# Patient Record
Sex: Male | Born: 1945
Health system: Southern US, Community
[De-identification: ages and names within clinical notes are randomized; demographics above are authoritative.]

## PROBLEM LIST (undated history)

## (undated) DIAGNOSIS — E119 Type 2 diabetes mellitus without complications: Secondary | ICD-10-CM

## (undated) DIAGNOSIS — E669 Obesity, unspecified: Secondary | ICD-10-CM

## (undated) DIAGNOSIS — K259 Gastric ulcer, unspecified as acute or chronic, without hemorrhage or perforation: Secondary | ICD-10-CM

## (undated) DIAGNOSIS — I10 Essential (primary) hypertension: Secondary | ICD-10-CM

## (undated) DIAGNOSIS — G629 Polyneuropathy, unspecified: Secondary | ICD-10-CM

## (undated) DIAGNOSIS — K219 Gastro-esophageal reflux disease without esophagitis: Secondary | ICD-10-CM

## (undated) DIAGNOSIS — E785 Hyperlipidemia, unspecified: Secondary | ICD-10-CM

## (undated) DIAGNOSIS — M519 Unspecified thoracic, thoracolumbar and lumbosacral intervertebral disc disorder: Secondary | ICD-10-CM

## (undated) DIAGNOSIS — J449 Chronic obstructive pulmonary disease, unspecified: Secondary | ICD-10-CM

## (undated) DIAGNOSIS — I739 Peripheral vascular disease, unspecified: Secondary | ICD-10-CM

## (undated) DIAGNOSIS — K5792 Diverticulitis of intestine, part unspecified, without perforation or abscess without bleeding: Secondary | ICD-10-CM

## (undated) DIAGNOSIS — I712 Thoracic aortic aneurysm, without rupture: Secondary | ICD-10-CM

## (undated) DIAGNOSIS — Z77098 Contact with and (suspected) exposure to other hazardous, chiefly nonmedicinal, chemicals: Secondary | ICD-10-CM

## (undated) DIAGNOSIS — M1990 Unspecified osteoarthritis, unspecified site: Secondary | ICD-10-CM

## (undated) DIAGNOSIS — E1140 Type 2 diabetes mellitus with diabetic neuropathy, unspecified: Secondary | ICD-10-CM

## (undated) DIAGNOSIS — I2510 Atherosclerotic heart disease of native coronary artery without angina pectoris: Secondary | ICD-10-CM

## (undated) DIAGNOSIS — G4730 Sleep apnea, unspecified: Secondary | ICD-10-CM

## (undated) DIAGNOSIS — I7121 Aneurysm of the ascending aorta, without rupture: Secondary | ICD-10-CM

## (undated) DIAGNOSIS — K635 Polyp of colon: Secondary | ICD-10-CM

## (undated) DIAGNOSIS — K8020 Calculus of gallbladder without cholecystitis without obstruction: Secondary | ICD-10-CM

## (undated) DIAGNOSIS — F4310 Post-traumatic stress disorder, unspecified: Secondary | ICD-10-CM

## (undated) DIAGNOSIS — M5090 Cervical disc disorder, unspecified, unspecified cervical region: Secondary | ICD-10-CM

## (undated) HISTORY — DX: Type 2 diabetes mellitus with diabetic neuropathy, unspecified: E11.40

## (undated) HISTORY — PX: LUMBAR LAMINECTOMY: SHX95

## (undated) HISTORY — PX: SHOULDER SURGERY: SHX246

## (undated) HISTORY — PX: CARDIAC CATHETERIZATION: SHX172

## (undated) HISTORY — PX: CERVICAL SPINE SURGERY: SHX589

## (undated) HISTORY — DX: Gastric ulcer, unspecified as acute or chronic, without hemorrhage or perforation: K25.9

## (undated) HISTORY — PX: REPLACEMENT TOTAL KNEE: SUR1224

## (undated) HISTORY — DX: Sleep apnea, unspecified: G47.30

## (undated) HISTORY — PX: EYE SURGERY: SHX253

## (undated) HISTORY — PX: PARTIAL COLECTOMY: SHX5273

## (undated) HISTORY — DX: Atherosclerotic heart disease of native coronary artery without angina pectoris: I25.10

## (undated) HISTORY — DX: Gastro-esophageal reflux disease without esophagitis: K21.9

## (undated) HISTORY — DX: Contact with and (suspected) exposure to other hazardous, chiefly nonmedicinal, chemicals: Z77.098

## (undated) HISTORY — DX: Chronic obstructive pulmonary disease, unspecified: J44.9

## (undated) HISTORY — DX: Calculus of gallbladder without cholecystitis without obstruction: K80.20

## (undated) HISTORY — DX: Polyp of colon: K63.5

## (undated) HISTORY — PX: PTCA: SHX146

## (undated) HISTORY — DX: Essential (primary) hypertension: I10

## (undated) HISTORY — PX: FOOT SURGERY: SHX648

## (undated) HISTORY — DX: Diverticulitis of intestine, part unspecified, without perforation or abscess without bleeding: K57.92

## (undated) HISTORY — DX: Hyperlipidemia, unspecified: E78.5

## (undated) HISTORY — DX: Post-traumatic stress disorder, unspecified: F43.10

## (undated) HISTORY — PX: OTHER SURGICAL HISTORY: SHX169

## (undated) HISTORY — DX: Type 2 diabetes mellitus without complications: E11.9

---

## 2011-06-08 DIAGNOSIS — E7800 Pure hypercholesterolemia, unspecified: Secondary | ICD-10-CM | POA: Diagnosis not present

## 2011-06-08 DIAGNOSIS — L02219 Cutaneous abscess of trunk, unspecified: Secondary | ICD-10-CM | POA: Diagnosis not present

## 2011-06-08 DIAGNOSIS — I10 Essential (primary) hypertension: Secondary | ICD-10-CM | POA: Diagnosis not present

## 2011-06-08 DIAGNOSIS — L03319 Cellulitis of trunk, unspecified: Secondary | ICD-10-CM | POA: Diagnosis not present

## 2011-06-08 DIAGNOSIS — E119 Type 2 diabetes mellitus without complications: Secondary | ICD-10-CM | POA: Diagnosis not present

## 2011-06-14 DIAGNOSIS — E119 Type 2 diabetes mellitus without complications: Secondary | ICD-10-CM | POA: Diagnosis not present

## 2011-06-14 DIAGNOSIS — Z79899 Other long term (current) drug therapy: Secondary | ICD-10-CM | POA: Diagnosis not present

## 2011-06-14 DIAGNOSIS — E7800 Pure hypercholesterolemia, unspecified: Secondary | ICD-10-CM | POA: Diagnosis not present

## 2011-06-21 DIAGNOSIS — I10 Essential (primary) hypertension: Secondary | ICD-10-CM | POA: Diagnosis not present

## 2011-06-21 DIAGNOSIS — Z7982 Long term (current) use of aspirin: Secondary | ICD-10-CM | POA: Diagnosis not present

## 2011-06-21 DIAGNOSIS — Z79899 Other long term (current) drug therapy: Secondary | ICD-10-CM | POA: Diagnosis not present

## 2011-06-21 DIAGNOSIS — R0602 Shortness of breath: Secondary | ICD-10-CM | POA: Diagnosis not present

## 2011-06-21 DIAGNOSIS — M1990 Unspecified osteoarthritis, unspecified site: Secondary | ICD-10-CM | POA: Diagnosis not present

## 2011-06-21 DIAGNOSIS — Z87891 Personal history of nicotine dependence: Secondary | ICD-10-CM | POA: Diagnosis not present

## 2011-06-21 DIAGNOSIS — E119 Type 2 diabetes mellitus without complications: Secondary | ICD-10-CM | POA: Diagnosis not present

## 2011-06-21 DIAGNOSIS — E785 Hyperlipidemia, unspecified: Secondary | ICD-10-CM | POA: Diagnosis not present

## 2011-06-21 DIAGNOSIS — I2510 Atherosclerotic heart disease of native coronary artery without angina pectoris: Secondary | ICD-10-CM | POA: Diagnosis not present

## 2011-06-21 DIAGNOSIS — R079 Chest pain, unspecified: Secondary | ICD-10-CM | POA: Diagnosis not present

## 2011-06-21 DIAGNOSIS — F4310 Post-traumatic stress disorder, unspecified: Secondary | ICD-10-CM | POA: Diagnosis not present

## 2011-06-21 DIAGNOSIS — E669 Obesity, unspecified: Secondary | ICD-10-CM | POA: Diagnosis not present

## 2011-06-21 DIAGNOSIS — G5690 Unspecified mononeuropathy of unspecified upper limb: Secondary | ICD-10-CM | POA: Diagnosis not present

## 2011-06-21 DIAGNOSIS — G5790 Unspecified mononeuropathy of unspecified lower limb: Secondary | ICD-10-CM | POA: Diagnosis not present

## 2011-06-22 DIAGNOSIS — I2510 Atherosclerotic heart disease of native coronary artery without angina pectoris: Secondary | ICD-10-CM | POA: Diagnosis not present

## 2011-06-22 DIAGNOSIS — E785 Hyperlipidemia, unspecified: Secondary | ICD-10-CM | POA: Diagnosis not present

## 2011-06-22 DIAGNOSIS — I10 Essential (primary) hypertension: Secondary | ICD-10-CM | POA: Diagnosis not present

## 2011-06-22 DIAGNOSIS — E119 Type 2 diabetes mellitus without complications: Secondary | ICD-10-CM | POA: Diagnosis not present

## 2011-06-22 DIAGNOSIS — R079 Chest pain, unspecified: Secondary | ICD-10-CM | POA: Diagnosis not present

## 2011-07-05 DIAGNOSIS — I10 Essential (primary) hypertension: Secondary | ICD-10-CM | POA: Diagnosis not present

## 2011-07-05 DIAGNOSIS — E119 Type 2 diabetes mellitus without complications: Secondary | ICD-10-CM | POA: Diagnosis not present

## 2011-07-05 DIAGNOSIS — F411 Generalized anxiety disorder: Secondary | ICD-10-CM | POA: Diagnosis not present

## 2011-07-20 DIAGNOSIS — I209 Angina pectoris, unspecified: Secondary | ICD-10-CM | POA: Diagnosis not present

## 2011-07-20 DIAGNOSIS — R109 Unspecified abdominal pain: Secondary | ICD-10-CM | POA: Diagnosis not present

## 2011-07-20 DIAGNOSIS — R9430 Abnormal result of cardiovascular function study, unspecified: Secondary | ICD-10-CM | POA: Diagnosis not present

## 2011-07-20 DIAGNOSIS — I2510 Atherosclerotic heart disease of native coronary artery without angina pectoris: Secondary | ICD-10-CM | POA: Diagnosis not present

## 2011-07-25 DIAGNOSIS — R109 Unspecified abdominal pain: Secondary | ICD-10-CM | POA: Diagnosis not present

## 2011-07-27 DIAGNOSIS — Z01818 Encounter for other preprocedural examination: Secondary | ICD-10-CM | POA: Diagnosis not present

## 2011-07-27 DIAGNOSIS — I209 Angina pectoris, unspecified: Secondary | ICD-10-CM | POA: Diagnosis not present

## 2011-07-28 DIAGNOSIS — Z9189 Other specified personal risk factors, not elsewhere classified: Secondary | ICD-10-CM | POA: Diagnosis not present

## 2011-07-28 DIAGNOSIS — Z9861 Coronary angioplasty status: Secondary | ICD-10-CM | POA: Diagnosis not present

## 2011-07-28 DIAGNOSIS — I209 Angina pectoris, unspecified: Secondary | ICD-10-CM | POA: Diagnosis not present

## 2011-07-28 DIAGNOSIS — I2510 Atherosclerotic heart disease of native coronary artery without angina pectoris: Secondary | ICD-10-CM | POA: Diagnosis not present

## 2011-07-28 DIAGNOSIS — G4733 Obstructive sleep apnea (adult) (pediatric): Secondary | ICD-10-CM | POA: Diagnosis not present

## 2011-07-28 DIAGNOSIS — I10 Essential (primary) hypertension: Secondary | ICD-10-CM | POA: Diagnosis not present

## 2011-07-28 DIAGNOSIS — R9439 Abnormal result of other cardiovascular function study: Secondary | ICD-10-CM | POA: Diagnosis not present

## 2011-07-28 DIAGNOSIS — J449 Chronic obstructive pulmonary disease, unspecified: Secondary | ICD-10-CM | POA: Diagnosis not present

## 2011-07-28 DIAGNOSIS — E119 Type 2 diabetes mellitus without complications: Secondary | ICD-10-CM | POA: Diagnosis not present

## 2011-09-01 DIAGNOSIS — L97509 Non-pressure chronic ulcer of other part of unspecified foot with unspecified severity: Secondary | ICD-10-CM | POA: Diagnosis not present

## 2011-09-07 DIAGNOSIS — Z4789 Encounter for other orthopedic aftercare: Secondary | ICD-10-CM | POA: Diagnosis not present

## 2011-09-07 DIAGNOSIS — M25569 Pain in unspecified knee: Secondary | ICD-10-CM | POA: Diagnosis not present

## 2011-09-07 DIAGNOSIS — Z09 Encounter for follow-up examination after completed treatment for conditions other than malignant neoplasm: Secondary | ICD-10-CM | POA: Diagnosis not present

## 2011-09-08 DIAGNOSIS — I10 Essential (primary) hypertension: Secondary | ICD-10-CM | POA: Diagnosis not present

## 2011-09-08 DIAGNOSIS — Z0389 Encounter for observation for other suspected diseases and conditions ruled out: Secondary | ICD-10-CM | POA: Diagnosis not present

## 2011-09-08 DIAGNOSIS — I2510 Atherosclerotic heart disease of native coronary artery without angina pectoris: Secondary | ICD-10-CM | POA: Diagnosis not present

## 2011-09-08 DIAGNOSIS — I200 Unstable angina: Secondary | ICD-10-CM | POA: Diagnosis not present

## 2011-09-08 DIAGNOSIS — E119 Type 2 diabetes mellitus without complications: Secondary | ICD-10-CM | POA: Diagnosis not present

## 2011-09-09 DIAGNOSIS — Z0389 Encounter for observation for other suspected diseases and conditions ruled out: Secondary | ICD-10-CM | POA: Diagnosis not present

## 2011-09-09 DIAGNOSIS — I10 Essential (primary) hypertension: Secondary | ICD-10-CM | POA: Diagnosis not present

## 2011-09-09 DIAGNOSIS — I200 Unstable angina: Secondary | ICD-10-CM | POA: Diagnosis not present

## 2011-09-09 DIAGNOSIS — E785 Hyperlipidemia, unspecified: Secondary | ICD-10-CM | POA: Diagnosis not present

## 2011-09-09 DIAGNOSIS — E119 Type 2 diabetes mellitus without complications: Secondary | ICD-10-CM | POA: Diagnosis not present

## 2011-09-09 DIAGNOSIS — I2510 Atherosclerotic heart disease of native coronary artery without angina pectoris: Secondary | ICD-10-CM | POA: Diagnosis not present

## 2011-10-12 DIAGNOSIS — W57XXXA Bitten or stung by nonvenomous insect and other nonvenomous arthropods, initial encounter: Secondary | ICD-10-CM | POA: Diagnosis not present

## 2011-10-14 DIAGNOSIS — I209 Angina pectoris, unspecified: Secondary | ICD-10-CM | POA: Diagnosis not present

## 2011-10-14 DIAGNOSIS — Z9861 Coronary angioplasty status: Secondary | ICD-10-CM | POA: Diagnosis not present

## 2011-10-14 DIAGNOSIS — I2510 Atherosclerotic heart disease of native coronary artery without angina pectoris: Secondary | ICD-10-CM | POA: Diagnosis not present

## 2011-11-11 DIAGNOSIS — Z5189 Encounter for other specified aftercare: Secondary | ICD-10-CM | POA: Diagnosis not present

## 2011-11-11 DIAGNOSIS — Z9861 Coronary angioplasty status: Secondary | ICD-10-CM | POA: Diagnosis not present

## 2011-11-11 DIAGNOSIS — E119 Type 2 diabetes mellitus without complications: Secondary | ICD-10-CM | POA: Diagnosis not present

## 2011-11-14 DIAGNOSIS — Z5189 Encounter for other specified aftercare: Secondary | ICD-10-CM | POA: Diagnosis not present

## 2011-11-14 DIAGNOSIS — Z9861 Coronary angioplasty status: Secondary | ICD-10-CM | POA: Diagnosis not present

## 2011-11-14 DIAGNOSIS — E119 Type 2 diabetes mellitus without complications: Secondary | ICD-10-CM | POA: Diagnosis not present

## 2011-11-18 DIAGNOSIS — Z5189 Encounter for other specified aftercare: Secondary | ICD-10-CM | POA: Diagnosis not present

## 2011-11-18 DIAGNOSIS — E119 Type 2 diabetes mellitus without complications: Secondary | ICD-10-CM | POA: Diagnosis not present

## 2011-11-18 DIAGNOSIS — Z9861 Coronary angioplasty status: Secondary | ICD-10-CM | POA: Diagnosis not present

## 2011-11-21 DIAGNOSIS — Z9861 Coronary angioplasty status: Secondary | ICD-10-CM | POA: Diagnosis not present

## 2011-11-21 DIAGNOSIS — Z5189 Encounter for other specified aftercare: Secondary | ICD-10-CM | POA: Diagnosis not present

## 2011-11-21 DIAGNOSIS — E119 Type 2 diabetes mellitus without complications: Secondary | ICD-10-CM | POA: Diagnosis not present

## 2011-11-23 DIAGNOSIS — Z9861 Coronary angioplasty status: Secondary | ICD-10-CM | POA: Diagnosis not present

## 2011-11-23 DIAGNOSIS — Z5189 Encounter for other specified aftercare: Secondary | ICD-10-CM | POA: Diagnosis not present

## 2011-11-23 DIAGNOSIS — E119 Type 2 diabetes mellitus without complications: Secondary | ICD-10-CM | POA: Diagnosis not present

## 2011-11-25 DIAGNOSIS — Z9861 Coronary angioplasty status: Secondary | ICD-10-CM | POA: Diagnosis not present

## 2011-11-25 DIAGNOSIS — E119 Type 2 diabetes mellitus without complications: Secondary | ICD-10-CM | POA: Diagnosis not present

## 2011-11-25 DIAGNOSIS — Z5189 Encounter for other specified aftercare: Secondary | ICD-10-CM | POA: Diagnosis not present

## 2011-11-28 DIAGNOSIS — Z9861 Coronary angioplasty status: Secondary | ICD-10-CM | POA: Diagnosis not present

## 2011-11-28 DIAGNOSIS — Z5189 Encounter for other specified aftercare: Secondary | ICD-10-CM | POA: Diagnosis not present

## 2011-11-28 DIAGNOSIS — E119 Type 2 diabetes mellitus without complications: Secondary | ICD-10-CM | POA: Diagnosis not present

## 2011-11-30 DIAGNOSIS — Z9861 Coronary angioplasty status: Secondary | ICD-10-CM | POA: Diagnosis not present

## 2011-11-30 DIAGNOSIS — Z5189 Encounter for other specified aftercare: Secondary | ICD-10-CM | POA: Diagnosis not present

## 2011-11-30 DIAGNOSIS — E119 Type 2 diabetes mellitus without complications: Secondary | ICD-10-CM | POA: Diagnosis not present

## 2011-12-05 DIAGNOSIS — E119 Type 2 diabetes mellitus without complications: Secondary | ICD-10-CM | POA: Diagnosis not present

## 2011-12-05 DIAGNOSIS — Z5189 Encounter for other specified aftercare: Secondary | ICD-10-CM | POA: Diagnosis not present

## 2011-12-05 DIAGNOSIS — Z9861 Coronary angioplasty status: Secondary | ICD-10-CM | POA: Diagnosis not present

## 2011-12-07 DIAGNOSIS — Z5189 Encounter for other specified aftercare: Secondary | ICD-10-CM | POA: Diagnosis not present

## 2011-12-07 DIAGNOSIS — E119 Type 2 diabetes mellitus without complications: Secondary | ICD-10-CM | POA: Diagnosis not present

## 2011-12-07 DIAGNOSIS — Z9861 Coronary angioplasty status: Secondary | ICD-10-CM | POA: Diagnosis not present

## 2011-12-09 DIAGNOSIS — Z5189 Encounter for other specified aftercare: Secondary | ICD-10-CM | POA: Diagnosis not present

## 2011-12-09 DIAGNOSIS — E119 Type 2 diabetes mellitus without complications: Secondary | ICD-10-CM | POA: Diagnosis not present

## 2011-12-09 DIAGNOSIS — Z9861 Coronary angioplasty status: Secondary | ICD-10-CM | POA: Diagnosis not present

## 2011-12-14 DIAGNOSIS — Z9861 Coronary angioplasty status: Secondary | ICD-10-CM | POA: Diagnosis not present

## 2011-12-14 DIAGNOSIS — E119 Type 2 diabetes mellitus without complications: Secondary | ICD-10-CM | POA: Diagnosis not present

## 2011-12-14 DIAGNOSIS — Z5189 Encounter for other specified aftercare: Secondary | ICD-10-CM | POA: Diagnosis not present

## 2011-12-16 DIAGNOSIS — Z5189 Encounter for other specified aftercare: Secondary | ICD-10-CM | POA: Diagnosis not present

## 2011-12-16 DIAGNOSIS — Z9861 Coronary angioplasty status: Secondary | ICD-10-CM | POA: Diagnosis not present

## 2011-12-16 DIAGNOSIS — E119 Type 2 diabetes mellitus without complications: Secondary | ICD-10-CM | POA: Diagnosis not present

## 2011-12-19 DIAGNOSIS — E119 Type 2 diabetes mellitus without complications: Secondary | ICD-10-CM | POA: Diagnosis not present

## 2011-12-19 DIAGNOSIS — Z5189 Encounter for other specified aftercare: Secondary | ICD-10-CM | POA: Diagnosis not present

## 2011-12-19 DIAGNOSIS — M25549 Pain in joints of unspecified hand: Secondary | ICD-10-CM | POA: Diagnosis not present

## 2011-12-19 DIAGNOSIS — M19049 Primary osteoarthritis, unspecified hand: Secondary | ICD-10-CM | POA: Diagnosis not present

## 2011-12-19 DIAGNOSIS — Z9861 Coronary angioplasty status: Secondary | ICD-10-CM | POA: Diagnosis not present

## 2011-12-23 DIAGNOSIS — I10 Essential (primary) hypertension: Secondary | ICD-10-CM | POA: Diagnosis not present

## 2011-12-23 DIAGNOSIS — M25549 Pain in joints of unspecified hand: Secondary | ICD-10-CM | POA: Diagnosis not present

## 2011-12-23 DIAGNOSIS — M19049 Primary osteoarthritis, unspecified hand: Secondary | ICD-10-CM | POA: Diagnosis not present

## 2011-12-23 DIAGNOSIS — F329 Major depressive disorder, single episode, unspecified: Secondary | ICD-10-CM | POA: Diagnosis not present

## 2011-12-23 DIAGNOSIS — I2510 Atherosclerotic heart disease of native coronary artery without angina pectoris: Secondary | ICD-10-CM | POA: Diagnosis not present

## 2011-12-23 DIAGNOSIS — E119 Type 2 diabetes mellitus without complications: Secondary | ICD-10-CM | POA: Diagnosis not present

## 2012-01-04 DIAGNOSIS — Z9861 Coronary angioplasty status: Secondary | ICD-10-CM | POA: Diagnosis not present

## 2012-01-04 DIAGNOSIS — Z5189 Encounter for other specified aftercare: Secondary | ICD-10-CM | POA: Diagnosis not present

## 2012-01-04 DIAGNOSIS — E119 Type 2 diabetes mellitus without complications: Secondary | ICD-10-CM | POA: Diagnosis not present

## 2012-01-06 DIAGNOSIS — Z9861 Coronary angioplasty status: Secondary | ICD-10-CM | POA: Diagnosis not present

## 2012-01-06 DIAGNOSIS — E119 Type 2 diabetes mellitus without complications: Secondary | ICD-10-CM | POA: Diagnosis not present

## 2012-01-06 DIAGNOSIS — Z5189 Encounter for other specified aftercare: Secondary | ICD-10-CM | POA: Diagnosis not present

## 2012-01-09 DIAGNOSIS — Z9861 Coronary angioplasty status: Secondary | ICD-10-CM | POA: Diagnosis not present

## 2012-01-09 DIAGNOSIS — E119 Type 2 diabetes mellitus without complications: Secondary | ICD-10-CM | POA: Diagnosis not present

## 2012-01-09 DIAGNOSIS — Z5189 Encounter for other specified aftercare: Secondary | ICD-10-CM | POA: Diagnosis not present

## 2012-01-11 DIAGNOSIS — Z9861 Coronary angioplasty status: Secondary | ICD-10-CM | POA: Diagnosis not present

## 2012-01-11 DIAGNOSIS — E119 Type 2 diabetes mellitus without complications: Secondary | ICD-10-CM | POA: Diagnosis not present

## 2012-01-11 DIAGNOSIS — Z5189 Encounter for other specified aftercare: Secondary | ICD-10-CM | POA: Diagnosis not present

## 2012-01-13 DIAGNOSIS — Z9861 Coronary angioplasty status: Secondary | ICD-10-CM | POA: Diagnosis not present

## 2012-01-13 DIAGNOSIS — Z5189 Encounter for other specified aftercare: Secondary | ICD-10-CM | POA: Diagnosis not present

## 2012-01-13 DIAGNOSIS — E119 Type 2 diabetes mellitus without complications: Secondary | ICD-10-CM | POA: Diagnosis not present

## 2012-01-18 DIAGNOSIS — Z9861 Coronary angioplasty status: Secondary | ICD-10-CM | POA: Diagnosis not present

## 2012-01-18 DIAGNOSIS — E119 Type 2 diabetes mellitus without complications: Secondary | ICD-10-CM | POA: Diagnosis not present

## 2012-01-18 DIAGNOSIS — Z5189 Encounter for other specified aftercare: Secondary | ICD-10-CM | POA: Diagnosis not present

## 2012-01-20 DIAGNOSIS — E119 Type 2 diabetes mellitus without complications: Secondary | ICD-10-CM | POA: Diagnosis not present

## 2012-01-20 DIAGNOSIS — Z5189 Encounter for other specified aftercare: Secondary | ICD-10-CM | POA: Diagnosis not present

## 2012-01-20 DIAGNOSIS — Z9861 Coronary angioplasty status: Secondary | ICD-10-CM | POA: Diagnosis not present

## 2012-01-23 DIAGNOSIS — E119 Type 2 diabetes mellitus without complications: Secondary | ICD-10-CM | POA: Diagnosis not present

## 2012-01-23 DIAGNOSIS — Z5189 Encounter for other specified aftercare: Secondary | ICD-10-CM | POA: Diagnosis not present

## 2012-01-23 DIAGNOSIS — Z9861 Coronary angioplasty status: Secondary | ICD-10-CM | POA: Diagnosis not present

## 2012-01-25 DIAGNOSIS — Z9861 Coronary angioplasty status: Secondary | ICD-10-CM | POA: Diagnosis not present

## 2012-01-25 DIAGNOSIS — Z5189 Encounter for other specified aftercare: Secondary | ICD-10-CM | POA: Diagnosis not present

## 2012-01-25 DIAGNOSIS — E119 Type 2 diabetes mellitus without complications: Secondary | ICD-10-CM | POA: Diagnosis not present

## 2012-01-29 DIAGNOSIS — S99929A Unspecified injury of unspecified foot, initial encounter: Secondary | ICD-10-CM | POA: Diagnosis not present

## 2012-01-29 DIAGNOSIS — S6990XA Unspecified injury of unspecified wrist, hand and finger(s), initial encounter: Secondary | ICD-10-CM | POA: Diagnosis not present

## 2012-01-29 DIAGNOSIS — S0993XA Unspecified injury of face, initial encounter: Secondary | ICD-10-CM | POA: Diagnosis not present

## 2012-01-29 DIAGNOSIS — S8990XA Unspecified injury of unspecified lower leg, initial encounter: Secondary | ICD-10-CM | POA: Diagnosis not present

## 2012-01-29 DIAGNOSIS — S6980XA Other specified injuries of unspecified wrist, hand and finger(s), initial encounter: Secondary | ICD-10-CM | POA: Diagnosis not present

## 2012-01-29 DIAGNOSIS — M79609 Pain in unspecified limb: Secondary | ICD-10-CM | POA: Diagnosis not present

## 2012-01-29 DIAGNOSIS — M25569 Pain in unspecified knee: Secondary | ICD-10-CM | POA: Diagnosis not present

## 2012-01-29 DIAGNOSIS — S199XXA Unspecified injury of neck, initial encounter: Secondary | ICD-10-CM | POA: Diagnosis not present

## 2012-02-03 DIAGNOSIS — Z9861 Coronary angioplasty status: Secondary | ICD-10-CM | POA: Diagnosis not present

## 2012-02-03 DIAGNOSIS — E119 Type 2 diabetes mellitus without complications: Secondary | ICD-10-CM | POA: Diagnosis not present

## 2012-02-03 DIAGNOSIS — Z5189 Encounter for other specified aftercare: Secondary | ICD-10-CM | POA: Diagnosis not present

## 2012-02-06 DIAGNOSIS — E119 Type 2 diabetes mellitus without complications: Secondary | ICD-10-CM | POA: Diagnosis not present

## 2012-02-06 DIAGNOSIS — Z5189 Encounter for other specified aftercare: Secondary | ICD-10-CM | POA: Diagnosis not present

## 2012-02-06 DIAGNOSIS — Z9861 Coronary angioplasty status: Secondary | ICD-10-CM | POA: Diagnosis not present

## 2012-02-08 DIAGNOSIS — E119 Type 2 diabetes mellitus without complications: Secondary | ICD-10-CM | POA: Diagnosis not present

## 2012-02-08 DIAGNOSIS — Z9861 Coronary angioplasty status: Secondary | ICD-10-CM | POA: Diagnosis not present

## 2012-02-08 DIAGNOSIS — Z5189 Encounter for other specified aftercare: Secondary | ICD-10-CM | POA: Diagnosis not present

## 2012-02-13 DIAGNOSIS — Z23 Encounter for immunization: Secondary | ICD-10-CM | POA: Diagnosis not present

## 2012-02-13 DIAGNOSIS — Z5189 Encounter for other specified aftercare: Secondary | ICD-10-CM | POA: Diagnosis not present

## 2012-02-13 DIAGNOSIS — E119 Type 2 diabetes mellitus without complications: Secondary | ICD-10-CM | POA: Diagnosis not present

## 2012-02-13 DIAGNOSIS — Z9861 Coronary angioplasty status: Secondary | ICD-10-CM | POA: Diagnosis not present

## 2012-02-15 DIAGNOSIS — Z9861 Coronary angioplasty status: Secondary | ICD-10-CM | POA: Diagnosis not present

## 2012-02-15 DIAGNOSIS — E119 Type 2 diabetes mellitus without complications: Secondary | ICD-10-CM | POA: Diagnosis not present

## 2012-02-15 DIAGNOSIS — Z5189 Encounter for other specified aftercare: Secondary | ICD-10-CM | POA: Diagnosis not present

## 2012-02-17 DIAGNOSIS — Z5189 Encounter for other specified aftercare: Secondary | ICD-10-CM | POA: Diagnosis not present

## 2012-02-17 DIAGNOSIS — Z9861 Coronary angioplasty status: Secondary | ICD-10-CM | POA: Diagnosis not present

## 2012-02-17 DIAGNOSIS — E119 Type 2 diabetes mellitus without complications: Secondary | ICD-10-CM | POA: Diagnosis not present

## 2012-02-22 DIAGNOSIS — J209 Acute bronchitis, unspecified: Secondary | ICD-10-CM | POA: Diagnosis not present

## 2012-02-22 DIAGNOSIS — J449 Chronic obstructive pulmonary disease, unspecified: Secondary | ICD-10-CM | POA: Diagnosis not present

## 2012-02-22 DIAGNOSIS — K429 Umbilical hernia without obstruction or gangrene: Secondary | ICD-10-CM | POA: Diagnosis not present

## 2012-03-02 DIAGNOSIS — Z9861 Coronary angioplasty status: Secondary | ICD-10-CM | POA: Diagnosis not present

## 2012-03-02 DIAGNOSIS — E119 Type 2 diabetes mellitus without complications: Secondary | ICD-10-CM | POA: Diagnosis not present

## 2012-03-02 DIAGNOSIS — Z5189 Encounter for other specified aftercare: Secondary | ICD-10-CM | POA: Diagnosis not present

## 2012-03-05 DIAGNOSIS — E119 Type 2 diabetes mellitus without complications: Secondary | ICD-10-CM | POA: Diagnosis not present

## 2012-03-05 DIAGNOSIS — Z9861 Coronary angioplasty status: Secondary | ICD-10-CM | POA: Diagnosis not present

## 2012-03-05 DIAGNOSIS — Z5189 Encounter for other specified aftercare: Secondary | ICD-10-CM | POA: Diagnosis not present

## 2012-03-09 DIAGNOSIS — Z5189 Encounter for other specified aftercare: Secondary | ICD-10-CM | POA: Diagnosis not present

## 2012-03-09 DIAGNOSIS — Z9861 Coronary angioplasty status: Secondary | ICD-10-CM | POA: Diagnosis not present

## 2012-03-09 DIAGNOSIS — E119 Type 2 diabetes mellitus without complications: Secondary | ICD-10-CM | POA: Diagnosis not present

## 2012-03-12 DIAGNOSIS — E119 Type 2 diabetes mellitus without complications: Secondary | ICD-10-CM | POA: Diagnosis not present

## 2012-03-12 DIAGNOSIS — Z9861 Coronary angioplasty status: Secondary | ICD-10-CM | POA: Diagnosis not present

## 2012-03-12 DIAGNOSIS — Z5189 Encounter for other specified aftercare: Secondary | ICD-10-CM | POA: Diagnosis not present

## 2012-03-14 DIAGNOSIS — Z5189 Encounter for other specified aftercare: Secondary | ICD-10-CM | POA: Diagnosis not present

## 2012-03-14 DIAGNOSIS — E119 Type 2 diabetes mellitus without complications: Secondary | ICD-10-CM | POA: Diagnosis not present

## 2012-03-14 DIAGNOSIS — Z9861 Coronary angioplasty status: Secondary | ICD-10-CM | POA: Diagnosis not present

## 2012-03-16 DIAGNOSIS — E119 Type 2 diabetes mellitus without complications: Secondary | ICD-10-CM | POA: Diagnosis not present

## 2012-03-16 DIAGNOSIS — Z9861 Coronary angioplasty status: Secondary | ICD-10-CM | POA: Diagnosis not present

## 2012-03-16 DIAGNOSIS — Z5189 Encounter for other specified aftercare: Secondary | ICD-10-CM | POA: Diagnosis not present

## 2012-03-26 DIAGNOSIS — J209 Acute bronchitis, unspecified: Secondary | ICD-10-CM | POA: Diagnosis not present

## 2012-05-11 DIAGNOSIS — H35039 Hypertensive retinopathy, unspecified eye: Secondary | ICD-10-CM | POA: Diagnosis not present

## 2012-05-11 DIAGNOSIS — E1139 Type 2 diabetes mellitus with other diabetic ophthalmic complication: Secondary | ICD-10-CM | POA: Diagnosis not present

## 2012-05-16 DIAGNOSIS — E669 Obesity, unspecified: Secondary | ICD-10-CM | POA: Diagnosis not present

## 2012-05-16 DIAGNOSIS — E7800 Pure hypercholesterolemia, unspecified: Secondary | ICD-10-CM | POA: Diagnosis not present

## 2012-05-16 DIAGNOSIS — I2510 Atherosclerotic heart disease of native coronary artery without angina pectoris: Secondary | ICD-10-CM | POA: Diagnosis not present

## 2012-05-16 DIAGNOSIS — Z9861 Coronary angioplasty status: Secondary | ICD-10-CM | POA: Diagnosis not present

## 2012-06-07 DIAGNOSIS — M75100 Unspecified rotator cuff tear or rupture of unspecified shoulder, not specified as traumatic: Secondary | ICD-10-CM | POA: Diagnosis not present

## 2012-06-07 DIAGNOSIS — M25519 Pain in unspecified shoulder: Secondary | ICD-10-CM | POA: Diagnosis not present

## 2012-06-14 DIAGNOSIS — M75110 Incomplete rotator cuff tear or rupture of unspecified shoulder, not specified as traumatic: Secondary | ICD-10-CM | POA: Diagnosis not present

## 2012-06-14 DIAGNOSIS — M19019 Primary osteoarthritis, unspecified shoulder: Secondary | ICD-10-CM | POA: Diagnosis not present

## 2012-06-14 DIAGNOSIS — M25519 Pain in unspecified shoulder: Secondary | ICD-10-CM | POA: Diagnosis not present

## 2012-06-22 DIAGNOSIS — I2510 Atherosclerotic heart disease of native coronary artery without angina pectoris: Secondary | ICD-10-CM | POA: Diagnosis not present

## 2012-06-22 DIAGNOSIS — E119 Type 2 diabetes mellitus without complications: Secondary | ICD-10-CM | POA: Diagnosis not present

## 2012-06-22 DIAGNOSIS — M75110 Incomplete rotator cuff tear or rupture of unspecified shoulder, not specified as traumatic: Secondary | ICD-10-CM | POA: Diagnosis not present

## 2012-06-22 DIAGNOSIS — I10 Essential (primary) hypertension: Secondary | ICD-10-CM | POA: Diagnosis not present

## 2012-06-22 DIAGNOSIS — M719 Bursopathy, unspecified: Secondary | ICD-10-CM | POA: Diagnosis not present

## 2012-06-22 DIAGNOSIS — M25819 Other specified joint disorders, unspecified shoulder: Secondary | ICD-10-CM | POA: Diagnosis not present

## 2012-06-22 DIAGNOSIS — M67919 Unspecified disorder of synovium and tendon, unspecified shoulder: Secondary | ICD-10-CM | POA: Diagnosis not present

## 2012-06-22 DIAGNOSIS — M19019 Primary osteoarthritis, unspecified shoulder: Secondary | ICD-10-CM | POA: Diagnosis not present

## 2012-06-22 DIAGNOSIS — S43429A Sprain of unspecified rotator cuff capsule, initial encounter: Secondary | ICD-10-CM | POA: Diagnosis not present

## 2012-06-22 DIAGNOSIS — M25519 Pain in unspecified shoulder: Secondary | ICD-10-CM | POA: Diagnosis not present

## 2012-06-22 DIAGNOSIS — F329 Major depressive disorder, single episode, unspecified: Secondary | ICD-10-CM | POA: Diagnosis not present

## 2012-09-26 DIAGNOSIS — N453 Epididymo-orchitis: Secondary | ICD-10-CM | POA: Diagnosis not present

## 2012-09-26 DIAGNOSIS — M545 Low back pain: Secondary | ICD-10-CM | POA: Diagnosis not present

## 2012-10-18 DIAGNOSIS — M25519 Pain in unspecified shoulder: Secondary | ICD-10-CM | POA: Diagnosis not present

## 2012-10-19 DIAGNOSIS — IMO0002 Reserved for concepts with insufficient information to code with codable children: Secondary | ICD-10-CM | POA: Diagnosis not present

## 2012-10-19 DIAGNOSIS — M25519 Pain in unspecified shoulder: Secondary | ICD-10-CM | POA: Diagnosis not present

## 2012-10-24 DIAGNOSIS — M75100 Unspecified rotator cuff tear or rupture of unspecified shoulder, not specified as traumatic: Secondary | ICD-10-CM | POA: Diagnosis not present

## 2012-10-24 DIAGNOSIS — M75110 Incomplete rotator cuff tear or rupture of unspecified shoulder, not specified as traumatic: Secondary | ICD-10-CM | POA: Diagnosis not present

## 2012-10-24 DIAGNOSIS — M25519 Pain in unspecified shoulder: Secondary | ICD-10-CM | POA: Diagnosis not present

## 2012-11-02 DIAGNOSIS — E119 Type 2 diabetes mellitus without complications: Secondary | ICD-10-CM | POA: Diagnosis not present

## 2012-11-02 DIAGNOSIS — M545 Low back pain: Secondary | ICD-10-CM | POA: Diagnosis not present

## 2012-11-02 DIAGNOSIS — I2510 Atherosclerotic heart disease of native coronary artery without angina pectoris: Secondary | ICD-10-CM | POA: Diagnosis not present

## 2012-11-09 DIAGNOSIS — M25519 Pain in unspecified shoulder: Secondary | ICD-10-CM | POA: Diagnosis not present

## 2012-11-09 DIAGNOSIS — M75100 Unspecified rotator cuff tear or rupture of unspecified shoulder, not specified as traumatic: Secondary | ICD-10-CM | POA: Diagnosis not present

## 2012-11-09 DIAGNOSIS — I10 Essential (primary) hypertension: Secondary | ICD-10-CM | POA: Diagnosis not present

## 2012-11-09 DIAGNOSIS — R52 Pain, unspecified: Secondary | ICD-10-CM | POA: Diagnosis not present

## 2012-11-09 DIAGNOSIS — J449 Chronic obstructive pulmonary disease, unspecified: Secondary | ICD-10-CM | POA: Diagnosis not present

## 2012-11-09 DIAGNOSIS — I2510 Atherosclerotic heart disease of native coronary artery without angina pectoris: Secondary | ICD-10-CM | POA: Diagnosis not present

## 2012-11-09 DIAGNOSIS — F329 Major depressive disorder, single episode, unspecified: Secondary | ICD-10-CM | POA: Diagnosis not present

## 2012-11-09 DIAGNOSIS — M75120 Complete rotator cuff tear or rupture of unspecified shoulder, not specified as traumatic: Secondary | ICD-10-CM | POA: Diagnosis not present

## 2012-11-09 DIAGNOSIS — M75110 Incomplete rotator cuff tear or rupture of unspecified shoulder, not specified as traumatic: Secondary | ICD-10-CM | POA: Diagnosis not present

## 2012-11-09 DIAGNOSIS — E119 Type 2 diabetes mellitus without complications: Secondary | ICD-10-CM | POA: Diagnosis not present

## 2012-11-09 DIAGNOSIS — M129 Arthropathy, unspecified: Secondary | ICD-10-CM | POA: Diagnosis not present

## 2012-11-09 DIAGNOSIS — S43499A Other sprain of unspecified shoulder joint, initial encounter: Secondary | ICD-10-CM | POA: Diagnosis not present

## 2012-11-15 DIAGNOSIS — E119 Type 2 diabetes mellitus without complications: Secondary | ICD-10-CM | POA: Diagnosis not present

## 2012-11-15 DIAGNOSIS — E7800 Pure hypercholesterolemia, unspecified: Secondary | ICD-10-CM | POA: Diagnosis not present

## 2012-11-15 DIAGNOSIS — Z79899 Other long term (current) drug therapy: Secondary | ICD-10-CM | POA: Diagnosis not present

## 2012-12-19 DIAGNOSIS — Z9889 Other specified postprocedural states: Secondary | ICD-10-CM | POA: Diagnosis not present

## 2012-12-19 DIAGNOSIS — IMO0001 Reserved for inherently not codable concepts without codable children: Secondary | ICD-10-CM | POA: Diagnosis not present

## 2012-12-19 DIAGNOSIS — M79609 Pain in unspecified limb: Secondary | ICD-10-CM | POA: Diagnosis not present

## 2012-12-19 DIAGNOSIS — M6281 Muscle weakness (generalized): Secondary | ICD-10-CM | POA: Diagnosis not present

## 2012-12-19 DIAGNOSIS — M25519 Pain in unspecified shoulder: Secondary | ICD-10-CM | POA: Diagnosis not present

## 2012-12-19 DIAGNOSIS — Z4789 Encounter for other orthopedic aftercare: Secondary | ICD-10-CM | POA: Diagnosis not present

## 2012-12-20 DIAGNOSIS — M79609 Pain in unspecified limb: Secondary | ICD-10-CM | POA: Diagnosis not present

## 2012-12-20 DIAGNOSIS — Z4789 Encounter for other orthopedic aftercare: Secondary | ICD-10-CM | POA: Diagnosis not present

## 2012-12-20 DIAGNOSIS — IMO0001 Reserved for inherently not codable concepts without codable children: Secondary | ICD-10-CM | POA: Diagnosis not present

## 2012-12-20 DIAGNOSIS — M25519 Pain in unspecified shoulder: Secondary | ICD-10-CM | POA: Diagnosis not present

## 2012-12-20 DIAGNOSIS — Z9889 Other specified postprocedural states: Secondary | ICD-10-CM | POA: Diagnosis not present

## 2012-12-20 DIAGNOSIS — M6281 Muscle weakness (generalized): Secondary | ICD-10-CM | POA: Diagnosis not present

## 2012-12-21 DIAGNOSIS — E785 Hyperlipidemia, unspecified: Secondary | ICD-10-CM | POA: Diagnosis not present

## 2012-12-21 DIAGNOSIS — E119 Type 2 diabetes mellitus without complications: Secondary | ICD-10-CM | POA: Diagnosis not present

## 2012-12-21 DIAGNOSIS — I10 Essential (primary) hypertension: Secondary | ICD-10-CM | POA: Diagnosis not present

## 2012-12-28 DIAGNOSIS — Z4789 Encounter for other orthopedic aftercare: Secondary | ICD-10-CM | POA: Diagnosis not present

## 2012-12-28 DIAGNOSIS — M6281 Muscle weakness (generalized): Secondary | ICD-10-CM | POA: Diagnosis not present

## 2012-12-28 DIAGNOSIS — IMO0001 Reserved for inherently not codable concepts without codable children: Secondary | ICD-10-CM | POA: Diagnosis not present

## 2012-12-28 DIAGNOSIS — M79609 Pain in unspecified limb: Secondary | ICD-10-CM | POA: Diagnosis not present

## 2012-12-28 DIAGNOSIS — M25519 Pain in unspecified shoulder: Secondary | ICD-10-CM | POA: Diagnosis not present

## 2012-12-28 DIAGNOSIS — Z9889 Other specified postprocedural states: Secondary | ICD-10-CM | POA: Diagnosis not present

## 2012-12-31 DIAGNOSIS — M25519 Pain in unspecified shoulder: Secondary | ICD-10-CM | POA: Diagnosis not present

## 2012-12-31 DIAGNOSIS — IMO0001 Reserved for inherently not codable concepts without codable children: Secondary | ICD-10-CM | POA: Diagnosis not present

## 2012-12-31 DIAGNOSIS — Z4789 Encounter for other orthopedic aftercare: Secondary | ICD-10-CM | POA: Diagnosis not present

## 2012-12-31 DIAGNOSIS — Z9889 Other specified postprocedural states: Secondary | ICD-10-CM | POA: Diagnosis not present

## 2012-12-31 DIAGNOSIS — M79609 Pain in unspecified limb: Secondary | ICD-10-CM | POA: Diagnosis not present

## 2012-12-31 DIAGNOSIS — M6281 Muscle weakness (generalized): Secondary | ICD-10-CM | POA: Diagnosis not present

## 2013-01-02 DIAGNOSIS — Z4789 Encounter for other orthopedic aftercare: Secondary | ICD-10-CM | POA: Diagnosis not present

## 2013-01-02 DIAGNOSIS — M25519 Pain in unspecified shoulder: Secondary | ICD-10-CM | POA: Diagnosis not present

## 2013-01-02 DIAGNOSIS — M6281 Muscle weakness (generalized): Secondary | ICD-10-CM | POA: Diagnosis not present

## 2013-01-02 DIAGNOSIS — Z9889 Other specified postprocedural states: Secondary | ICD-10-CM | POA: Diagnosis not present

## 2013-01-02 DIAGNOSIS — IMO0001 Reserved for inherently not codable concepts without codable children: Secondary | ICD-10-CM | POA: Diagnosis not present

## 2013-01-02 DIAGNOSIS — M79609 Pain in unspecified limb: Secondary | ICD-10-CM | POA: Diagnosis not present

## 2013-01-04 DIAGNOSIS — M6281 Muscle weakness (generalized): Secondary | ICD-10-CM | POA: Diagnosis not present

## 2013-01-04 DIAGNOSIS — Z4789 Encounter for other orthopedic aftercare: Secondary | ICD-10-CM | POA: Diagnosis not present

## 2013-01-04 DIAGNOSIS — M79609 Pain in unspecified limb: Secondary | ICD-10-CM | POA: Diagnosis not present

## 2013-01-04 DIAGNOSIS — Z9889 Other specified postprocedural states: Secondary | ICD-10-CM | POA: Diagnosis not present

## 2013-01-04 DIAGNOSIS — IMO0001 Reserved for inherently not codable concepts without codable children: Secondary | ICD-10-CM | POA: Diagnosis not present

## 2013-01-04 DIAGNOSIS — M25519 Pain in unspecified shoulder: Secondary | ICD-10-CM | POA: Diagnosis not present

## 2013-01-07 DIAGNOSIS — M79609 Pain in unspecified limb: Secondary | ICD-10-CM | POA: Diagnosis not present

## 2013-01-07 DIAGNOSIS — M6281 Muscle weakness (generalized): Secondary | ICD-10-CM | POA: Diagnosis not present

## 2013-01-07 DIAGNOSIS — Z9889 Other specified postprocedural states: Secondary | ICD-10-CM | POA: Diagnosis not present

## 2013-01-07 DIAGNOSIS — IMO0001 Reserved for inherently not codable concepts without codable children: Secondary | ICD-10-CM | POA: Diagnosis not present

## 2013-01-07 DIAGNOSIS — M25519 Pain in unspecified shoulder: Secondary | ICD-10-CM | POA: Diagnosis not present

## 2013-01-07 DIAGNOSIS — Z4789 Encounter for other orthopedic aftercare: Secondary | ICD-10-CM | POA: Diagnosis not present

## 2013-01-09 DIAGNOSIS — M79609 Pain in unspecified limb: Secondary | ICD-10-CM | POA: Diagnosis not present

## 2013-01-09 DIAGNOSIS — M6281 Muscle weakness (generalized): Secondary | ICD-10-CM | POA: Diagnosis not present

## 2013-01-09 DIAGNOSIS — Z4789 Encounter for other orthopedic aftercare: Secondary | ICD-10-CM | POA: Diagnosis not present

## 2013-01-09 DIAGNOSIS — IMO0001 Reserved for inherently not codable concepts without codable children: Secondary | ICD-10-CM | POA: Diagnosis not present

## 2013-01-09 DIAGNOSIS — M25519 Pain in unspecified shoulder: Secondary | ICD-10-CM | POA: Diagnosis not present

## 2013-01-09 DIAGNOSIS — Z9889 Other specified postprocedural states: Secondary | ICD-10-CM | POA: Diagnosis not present

## 2013-01-11 DIAGNOSIS — Z9889 Other specified postprocedural states: Secondary | ICD-10-CM | POA: Diagnosis not present

## 2013-01-11 DIAGNOSIS — Z4789 Encounter for other orthopedic aftercare: Secondary | ICD-10-CM | POA: Diagnosis not present

## 2013-01-11 DIAGNOSIS — M79609 Pain in unspecified limb: Secondary | ICD-10-CM | POA: Diagnosis not present

## 2013-01-11 DIAGNOSIS — M25519 Pain in unspecified shoulder: Secondary | ICD-10-CM | POA: Diagnosis not present

## 2013-01-11 DIAGNOSIS — M6281 Muscle weakness (generalized): Secondary | ICD-10-CM | POA: Diagnosis not present

## 2013-01-11 DIAGNOSIS — IMO0001 Reserved for inherently not codable concepts without codable children: Secondary | ICD-10-CM | POA: Diagnosis not present

## 2013-03-05 DIAGNOSIS — Z23 Encounter for immunization: Secondary | ICD-10-CM | POA: Diagnosis not present

## 2013-03-19 ENCOUNTER — Encounter (INDEPENDENT_AMBULATORY_CARE_PROVIDER_SITE_OTHER): Payer: Self-pay

## 2013-03-19 ENCOUNTER — Ambulatory Visit (INDEPENDENT_AMBULATORY_CARE_PROVIDER_SITE_OTHER): Payer: Medicare Other | Admitting: General Practice

## 2013-03-19 ENCOUNTER — Encounter: Payer: Self-pay | Admitting: General Practice

## 2013-03-19 VITALS — BP 124/77 | HR 71 | Temp 97.8°F | Ht 68.0 in | Wt 204.0 lb

## 2013-03-19 DIAGNOSIS — E119 Type 2 diabetes mellitus without complications: Secondary | ICD-10-CM | POA: Diagnosis not present

## 2013-03-19 DIAGNOSIS — E785 Hyperlipidemia, unspecified: Secondary | ICD-10-CM

## 2013-03-19 DIAGNOSIS — Z7689 Persons encountering health services in other specified circumstances: Secondary | ICD-10-CM

## 2013-03-19 DIAGNOSIS — J449 Chronic obstructive pulmonary disease, unspecified: Secondary | ICD-10-CM

## 2013-03-19 DIAGNOSIS — F4310 Post-traumatic stress disorder, unspecified: Secondary | ICD-10-CM

## 2013-03-19 DIAGNOSIS — I10 Essential (primary) hypertension: Secondary | ICD-10-CM | POA: Diagnosis not present

## 2013-03-19 NOTE — Progress Notes (Signed)
  Subjective:    Patient ID: Eduardo [name], male    DOB: February 05, 1946, 67 y.o.   MRN: [id number]  HPI Patient presents today to establish care. Reports recently relocating from [place]. He reports a history of COPD, HTN , Hyperlipidemia, Diabetes, PTSD. Checks blood pressure twice weekly and ranges 120-130's/70-80's. Reports being seen by a psyciatrist to manage PTSD and currently taking 100 mg of zoloft daily. Reports being an disable veteran and awaiting the transfer of care from [facility] hospital in [place] to [place] (estimates about 8 weeks for completion). He reports his chronic health conditions will be managed by the [facility] hospital and acute by this office. He reports having a 3 month supply of medications and labs drawn 8 weeks ago. Reports being seen by Dr. Margarita Sermons at [facility] in [place] and a release of information form has been signed. He denies concerns at this time.     Review of Systems  Constitutional: Negative for fever and chills.  Respiratory: Negative for chest tightness, shortness of breath and wheezing.   Cardiovascular: Negative for chest pain and palpitations.  Gastrointestinal: Negative for nausea, vomiting, abdominal pain, diarrhea, constipation and blood in stool.  Genitourinary: Negative for hematuria and difficulty urinating.  Neurological: Negative for dizziness, weakness and headaches.       Objective:   Physical Exam  Constitutional: He is oriented to person, place, and time. He appears well-developed and well-nourished.  HENT:  Head: Normocephalic and atraumatic.  Right Ear: External ear normal.  Left Ear: External ear normal.  Nose: Nose normal.  Mouth/Throat: Oropharynx is clear and moist.  Eyes: Conjunctivae and EOM are normal. Pupils are equal, round, and reactive to light.  Neck: Normal range of motion. Neck supple. No thyromegaly present.  Cardiovascular: Normal rate, regular rhythm and normal heart sounds.   Pulmonary/Chest: Effort normal and  breath sounds normal. No respiratory distress. He exhibits no tenderness.  Abdominal: Soft. Bowel sounds are normal. He exhibits no distension. There is no tenderness.  Lymphadenopathy:    He has no cervical adenopathy.  Neurological: He is alert and oriented to person, place, and time.  Skin: Skin is warm and dry.  Psychiatric: He has a normal mood and affect.          Assessment & Plan:  1. Encounter to establish care   2. COPD (chronic obstructive pulmonary disease)   3. HTN (hypertension)   4. Hyperlipidemia   5. Diabetes   6. PTSD (post-traumatic stress disorder)   1. Encounter to establish care -will await medical records, including recent labs -RTO prn -Patient verbalized understanding -Coralie Keens, FNP-C

## 2013-04-10 ENCOUNTER — Telehealth: Payer: Self-pay | Admitting: *Deleted

## 2013-04-10 DIAGNOSIS — I25810 Atherosclerosis of coronary artery bypass graft(s) without angina pectoris: Secondary | ICD-10-CM

## 2013-04-10 NOTE — Telephone Encounter (Signed)
Patient came in to establish care at the end of October. He needs to establish with a cardiologist as well for CAD. Referral made.

## 2013-04-16 ENCOUNTER — Ambulatory Visit (INDEPENDENT_AMBULATORY_CARE_PROVIDER_SITE_OTHER): Payer: Medicare Other | Admitting: Cardiovascular Disease

## 2013-04-16 ENCOUNTER — Encounter: Payer: Self-pay | Admitting: Cardiovascular Disease

## 2013-04-16 VITALS — BP 128/77 | HR 78 | Ht 68.0 in | Wt 207.0 lb

## 2013-04-16 DIAGNOSIS — I2510 Atherosclerotic heart disease of native coronary artery without angina pectoris: Secondary | ICD-10-CM

## 2013-04-16 DIAGNOSIS — I10 Essential (primary) hypertension: Secondary | ICD-10-CM | POA: Insufficient documentation

## 2013-04-16 DIAGNOSIS — E785 Hyperlipidemia, unspecified: Secondary | ICD-10-CM

## 2013-04-16 NOTE — Progress Notes (Signed)
Patient ID: Eduardo [name], male   DOB: February 25, 1946, 67 y.o.   MRN: [id number]       CARDIOLOGY CONSULT NOTE  Patient ID: Eduardo [name] MRN: [id number] DOB/AGE: 1946-03-06 67 y.o.  Admit date: (Not on file) Primary Physician Rudi Heap, MD  Reason for Consultation: CAD  HPI: [name] is a 67 yr old man with CAD, who is here to establish cardiovascular care. He recently relocated to [facility] from [place]. He previously received his care from the [facility] in [place], [place]. He has a history of COPD, HTN , hyperlipidemia, diabetes, and PTSD. He checks his blood pressure twice weekly and ranges 120-130's/70-80's.   He says he had multiple balloon angioplasties in either late 2013 or early 2014, but denies having stents. His wife, who is to be my patient, has stents. He says he had "7 blockages". He describes what sounds like a stenosis at a bifurcation (LAD and diagonal?).  He denies having had an MI. He believes his COPD is worse. He has diabetes with neuropathy. He denies chest pain, palpitations, lightheadedness, leg swelling, and syncope.   He had agent orange exposure in [place] for 3 years.   Prior to PTCA, his primary symptom was chest pain. He says he's "been on every statin they have including the new ones", and reports intolerance to all statins, experiencing significant joint and muscle pain.  SocHx: quit smoking in 1989.  FamHx: strong h/o CAD on father's side.     Allergies  Allergen Reactions  . Statins     Body aches    Current Outpatient Prescriptions  Medication Sig Dispense Refill  . ALBUTEROL SULFATE PO Take 90 mcg by mouth.      [name] albuterol-ipratropium (COMBIVENT) 18-103 MCG/ACT inhaler Inhale 2 puffs into the lungs every 4 (four) hours as needed for wheezing.      [name] amLODipine-benazepril (LOTREL) 5-20 MG per capsule Take 1 capsule by mouth daily.      [name] aspirin 81 MG tablet Take 81 mg by mouth daily.      [name] ezetimibe (ZETIA) 10 MG tablet Take 10 mg by  mouth daily.      . fenofibrate micronized (LOFIBRA) 134 MG capsule Take 134 mg by mouth daily before breakfast.      . glucose blood test strip 1 each by Other route as needed for other. Use as instructed      . metFORMIN (GLUCOPHAGE) 1000 MG tablet Take 1,000 mg by mouth 2 (two) times daily with a meal.      . metoprolol (LOPRESSOR) 50 MG tablet Take 50 mg by mouth 2 (two) times daily.      . prazosin (MINIPRESS) 2 MG capsule Take 2 mg by mouth at bedtime.      . sertraline (ZOLOFT) 100 MG tablet Take 100 mg by mouth daily.      [name] tiotropium (SPIRIVA) 18 MCG inhalation capsule Place 18 mcg into inhaler and inhale daily.       No current facility-administered medications for this visit.    Past Medical History  Diagnosis Date  . COPD (chronic obstructive pulmonary disease)   . CAD (coronary artery disease)   . Diabetes mellitus without complication   . Hypertension   . Neuromuscular disorder   . Sleep apnea   . Chemical exposure     agent orange   . PTSD (post-traumatic stress disorder)   . Diverticulitis     Past Surgical History  Procedure Laterality Date  . Spine surgery    .  Cardiac catheterization    . Cholecystectomy    . Shoulder surgery    . Knee surgery      right   . Ptca      unsuccesful  . Cervical spine surgery      History   Social History  . Marital Status: Married    Spouse Name: N/A    Number of Children: N/A  . Years of Education: N/A   Occupational History  . Not on file.   Social History Main Topics  . Smoking status: Former Smoker -- 0.75 packs/day for 30 years    Types: Cigarettes    Quit date: 05/31/1987  . Smokeless tobacco: Never Used  . Alcohol Use: No  . Drug Use: No  . Sexual Activity: Not on file   Other Topics Concern  . Not on file   Social History Narrative  . No narrative on file     No family history on file.   Prior to Admission medications   Medication Sig Start Date End Date Taking? Authorizing Provider    ALBUTEROL SULFATE PO Take 90 mcg by mouth.   Yes Historical Provider, MD  albuterol-ipratropium (COMBIVENT) 18-103 MCG/ACT inhaler Inhale 2 puffs into the lungs every 4 (four) hours as needed for wheezing.   Yes Historical Provider, MD  amLODipine-benazepril (LOTREL) 5-20 MG per capsule Take 1 capsule by mouth daily.   Yes Historical Provider, MD  aspirin 81 MG tablet Take 81 mg by mouth daily.   Yes Historical Provider, MD  ezetimibe (ZETIA) 10 MG tablet Take 10 mg by mouth daily.   Yes Historical Provider, MD  fenofibrate micronized (LOFIBRA) 134 MG capsule Take 134 mg by mouth daily before breakfast.   Yes Historical Provider, MD  glucose blood test strip 1 each by Other route as needed for other. Use as instructed   Yes Historical Provider, MD  metFORMIN (GLUCOPHAGE) 1000 MG tablet Take 1,000 mg by mouth 2 (two) times daily with a meal.   Yes Historical Provider, MD  metoprolol (LOPRESSOR) 50 MG tablet Take 50 mg by mouth 2 (two) times daily.   Yes Historical Provider, MD  prazosin (MINIPRESS) 2 MG capsule Take 2 mg by mouth at bedtime.   Yes Historical Provider, MD  sertraline (ZOLOFT) 100 MG tablet Take 100 mg by mouth daily.   Yes Historical Provider, MD  tiotropium (SPIRIVA) 18 MCG inhalation capsule Place 18 mcg into inhaler and inhale daily.   Yes Historical Provider, MD     Review of systems complete and found to be negative unless listed above in HPI     Physical exam Blood pressure 128/77, pulse 78, height 5\' 8"  (1.727 m), weight 207 lb (93.895 kg). General: NAD, obese Neck: No JVD, no thyromegaly or thyroid nodule.  Lungs: Clear to auscultation bilaterally with normal respiratory effort. CV: Nondisplaced PMI.  Heart regular S1/S2, no S3/S4, no murmur.  No peripheral edema.  No carotid bruit.  Normal pedal pulses.  Abdomen: Soft, nontender, no hepatosplenomegaly, no distention.  Skin: Intact without lesions or rashes.  Neurologic: Alert and oriented x 3.  Psych: Normal  affect. Extremities: No clubbing or cyanosis.  HEENT: Normal.   Labs:   No results found for this basename: WBC, HGB, HCT, MCV, PLT   No results found for this basename: NA, K, CL, CO2, BUN, CREATININE, CALCIUM, LABALBU, PROT, BILITOT, ALKPHOS, ALT, AST, GLUCOSE,  in the last 168 hours No results found for this basename: CKTOTAL, CKMB, CKMBINDEX, TROPONINI  No results found for this basename: CHOL   No results found for this basename: HDL   No results found for this basename: LDLCALC   No results found for this basename: TRIG   No results found for this basename: CHOLHDL   No results found for this basename: LDLDIRECT       EKG: Sinus rhythm, rate 79 bpm, axis within normal limits, intervals within normal limits, nonspecific T wave abnormality.  Studies: No results found.  ASSESSMENT AND PLAN: 1. CAD: symptomatically stable. On ASA and metoprolol. I will attempt to obtain his records from the [place] in [place] with regards to all cardiovascular testing (cath, echo). 2. HTN: controlled on current therapy which includes Lotrel. 3. Hyperlipidemia: on Zetia. Intolerant to statins. Will obtain his most recent lipid results from 3-4 months ago.  Signed: Prentice Docker, M.D., F.A.C.C.  04/16/2013, 1:04 PM

## 2013-04-16 NOTE — Patient Instructions (Signed)
Your physician recommends that you schedule a follow-up appointment in: 6 months with Dr. Koneswaran. You should receive a letter in the mail in 4 months. If you do not receive this letter by March 2015 call our office to schedule this appointment.   Your physician recommends that you continue on your current medications as directed. Please refer to the Current Medication list given to you today.  

## 2013-05-28 ENCOUNTER — Ambulatory Visit (INDEPENDENT_AMBULATORY_CARE_PROVIDER_SITE_OTHER): Payer: Medicare Other

## 2013-05-28 ENCOUNTER — Ambulatory Visit (INDEPENDENT_AMBULATORY_CARE_PROVIDER_SITE_OTHER): Payer: Medicare Other | Admitting: General Practice

## 2013-05-28 VITALS — BP 115/70 | HR 61 | Temp 98.1°F | Wt 198.0 lb

## 2013-05-28 DIAGNOSIS — S61409A Unspecified open wound of unspecified hand, initial encounter: Secondary | ICD-10-CM

## 2013-05-28 DIAGNOSIS — S6992XA Unspecified injury of left wrist, hand and finger(s), initial encounter: Secondary | ICD-10-CM

## 2013-05-28 DIAGNOSIS — S6990XA Unspecified injury of unspecified wrist, hand and finger(s), initial encounter: Secondary | ICD-10-CM

## 2013-05-28 DIAGNOSIS — S61402A Unspecified open wound of left hand, initial encounter: Secondary | ICD-10-CM

## 2013-05-28 DIAGNOSIS — Z23 Encounter for immunization: Secondary | ICD-10-CM | POA: Diagnosis not present

## 2013-05-28 MED ORDER — SULFAMETHOXAZOLE-TMP DS 800-160 MG PO TABS: 1.0000 | ORAL_TABLET | Freq: Two times a day (BID) | ORAL | Status: DC

## 2013-05-28 MED ORDER — CEPHALEXIN 500 MG PO CAPS: 500.0000 mg | ORAL_CAPSULE | Freq: Two times a day (BID) | ORAL | Status: DC

## 2013-05-28 MED ORDER — CEFTRIAXONE SODIUM 1 G IJ SOLR
1.0000 g | Freq: Once | INTRAMUSCULAR | Status: AC
  Administered 2013-05-28: 1 g via INTRAMUSCULAR

## 2013-05-28 NOTE — Patient Instructions (Signed)
Wound Care Wound care helps prevent pain and infection.  You may need a tetanus shot if:  You cannot remember when you had your last tetanus shot.  You have never had a tetanus shot.  The injury broke your skin. If you need a tetanus shot and you choose not to have one, you may get tetanus. Sickness from tetanus can be serious. HOME CARE   Only take medicine as told by your doctor.  Clean the wound daily with mild soap and water.  Change any bandages (dressings) as told by your doctor.  Put medicated cream and a bandage on the wound as told by your doctor.  Change the bandage if it gets wet, dirty, or starts to smell.  Take showers. Do not take baths, swim, or do anything that puts your wound under water.  Rest and raise (elevate) the wound until the pain and puffiness (swelling) are better.  Keep all doctor visits as told. GET HELP RIGHT AWAY IF:   Yellowish-white fluid (pus) comes from the wound.  Medicine does not lessen your pain.  There is a red streak going away from the wound.  You have a fever. MAKE SURE YOU:   Understand these instructions.  Will watch your condition.  Will get help right away if you are not doing well or get worse. Document Released: 02/23/2008 Document Revised: 08/08/2011 Document Reviewed: 09/19/2010 Exit[employer] Patient Information 2014 [employer].  

## 2013-05-28 NOTE — Progress Notes (Signed)
   Subjective:    Patient ID: Eduardo [name], male    DOB: 03/12/1946, 68 y.o.   MRN: [id number]  Hand Injury  The incident occurred 3 to 5 days ago. The incident occurred at home. The injury mechanism was a direct blow. The pain is present in the left hand. The quality of the pain is described as aching. The pain does not radiate. The pain is at a severity of 4/10. Pertinent negatives include no chest pain, muscle weakness, numbness or tingling. The symptoms are aggravated by movement. He has tried nothing for the symptoms.  Patient reports while attempting to place a wooden fence, a small piece of the wood entered his left hand. He reports attempting for past 4 days to remove foreign object from hand without success. He verbalized his hand continued to become more tender.    Review of Systems  Constitutional: Negative for fever and chills.  Respiratory: Negative for chest tightness and shortness of breath.   Cardiovascular: Negative for chest pain and palpitations.  Skin:       Sore in palm of left hand  Neurological: Negative for tingling and numbness.  All other systems reviewed and are negative.       Objective:   Physical Exam  Constitutional: He is oriented to person, place, and time. He appears well-developed and well-nourished.  Cardiovascular: Normal rate, regular rhythm and normal heart sounds.   Pulses:      Radial pulses are 2+ on the left side.  Pulmonary/Chest: Effort normal and breath sounds normal. No respiratory distress. He exhibits no tenderness.  Musculoskeletal: He exhibits tenderness.  Tenderness to left palm upon palpation and with flexion of finger. Patient able to make a closed fist.   Neurological: He is alert and oriented to person, place, and time.  Skin: Skin is warm and dry. There is erythema.  Left hand palm,1 inch below proximal phalanx:  1/2 x 1/2 inch cellulitis, with partially scabbed area to center, small amount of purulent drainage. Very  tender upon palpation.  Capillary refill less than 3 seconds. Negative discoloration noted beyond above.  Psychiatric: He has a normal mood and affect.    [facility] reading (PRIMARY) by  Coralie Keens, FNP-C, no fracture or dislocation.         Assessment & Plan:  1. Open wound of hand with complication, left, initial encounter and 2. Hand injury, left, initial encounter  - cefTRIAXone (ROCEPHIN) injection 1 g; Inject 1 g into the muscle once. -DG Hand Complete Left;Future - Ambulatory referral to Orthopedic Surgery (appointment with Murphy/Wainer at 9am, 05/29/13) - Aerobic culture - sulfamethoxazole-trimethoprim (BACTRIM DS) 800-160 MG per tablet; Take 1 tablet by mouth 2 (two) times daily.  Dispense: 20 tablet; Refill: 0 - cephALEXin (KEFLEX) 500 MG capsule; Take 1 capsule (500 mg total) by mouth 2 (two) times daily.  Dispense: 20 capsule; Refill: 0 - Tdap vaccine greater than or equal to 7yo IM -affected area cleansed with 1/2 NS and peroxide, patient to keep covered with Band-Aid until seen by ortho specialist -Patient verbalized understanding Coralie Keens, FNP-C

## 2013-05-29 ENCOUNTER — Inpatient Hospital Stay (HOSPITAL_COMMUNITY)
Admission: AD | Admit: 2013-05-29 | Discharge: 2013-05-30 | DRG: 603 | Disposition: A | Discharge status: Home / Self Care | Payer: Medicare Other | Source: Ambulatory Visit | Attending: Orthopedic Surgery | Admitting: Orthopedic Surgery

## 2013-05-29 DIAGNOSIS — S60559A Superficial foreign body of unspecified hand, initial encounter: Secondary | ICD-10-CM | POA: Diagnosis not present

## 2013-05-29 DIAGNOSIS — L02519 Cutaneous abscess of unspecified hand: Secondary | ICD-10-CM | POA: Diagnosis not present

## 2013-05-29 DIAGNOSIS — G4730 Sleep apnea, unspecified: Secondary | ICD-10-CM | POA: Diagnosis present

## 2013-05-29 DIAGNOSIS — Z7982 Long term (current) use of aspirin: Secondary | ICD-10-CM

## 2013-05-29 DIAGNOSIS — F4310 Post-traumatic stress disorder, unspecified: Secondary | ICD-10-CM | POA: Diagnosis present

## 2013-05-29 DIAGNOSIS — L089 Local infection of the skin and subcutaneous tissue, unspecified: Secondary | ICD-10-CM | POA: Diagnosis not present

## 2013-05-29 DIAGNOSIS — I10 Essential (primary) hypertension: Secondary | ICD-10-CM | POA: Diagnosis present

## 2013-05-29 DIAGNOSIS — I2510 Atherosclerotic heart disease of native coronary artery without angina pectoris: Secondary | ICD-10-CM | POA: Diagnosis present

## 2013-05-29 DIAGNOSIS — Z79899 Other long term (current) drug therapy: Secondary | ICD-10-CM | POA: Diagnosis not present

## 2013-05-29 DIAGNOSIS — Z87891 Personal history of nicotine dependence: Secondary | ICD-10-CM | POA: Diagnosis not present

## 2013-05-29 DIAGNOSIS — M79609 Pain in unspecified limb: Secondary | ICD-10-CM | POA: Diagnosis not present

## 2013-05-29 DIAGNOSIS — Z9189 Other specified personal risk factors, not elsewhere classified: Secondary | ICD-10-CM | POA: Diagnosis not present

## 2013-05-29 DIAGNOSIS — J4489 Other specified chronic obstructive pulmonary disease: Secondary | ICD-10-CM | POA: Diagnosis not present

## 2013-05-29 DIAGNOSIS — J449 Chronic obstructive pulmonary disease, unspecified: Secondary | ICD-10-CM | POA: Diagnosis not present

## 2013-05-29 DIAGNOSIS — E119 Type 2 diabetes mellitus without complications: Secondary | ICD-10-CM | POA: Diagnosis not present

## 2013-05-29 DIAGNOSIS — M25549 Pain in joints of unspecified hand: Secondary | ICD-10-CM | POA: Diagnosis not present

## 2013-05-29 LAB — CBC
HCT: 44.5 % (ref 39.0–52.0)
Hemoglobin: 15.8 g/dL (ref 13.0–17.0)
MCH: 32.6 pg (ref 26.0–34.0)
MCHC: 35.5 g/dL (ref 30.0–36.0)
RDW: 12.9 % (ref 11.5–15.5)

## 2013-05-29 LAB — GLUCOSE, CAPILLARY
Glucose-Capillary: 145 mg/dL — ABNORMAL HIGH (ref 70–99)
Glucose-Capillary: 159 mg/dL — ABNORMAL HIGH (ref 70–99)

## 2013-05-29 LAB — COMPREHENSIVE METABOLIC PANEL
AST: 17 U/L (ref 0–37)
Albumin: 4 g/dL (ref 3.5–5.2)
Alkaline Phosphatase: 64 U/L (ref 39–117)
BUN: 19 mg/dL (ref 6–23)
Calcium: 9.2 mg/dL (ref 8.4–10.5)
Creatinine, Ser: 0.73 mg/dL (ref 0.50–1.35)
GFR calc Af Amer: >90 mL/min (ref >90)
Glucose, Bld: 95 mg/dL (ref 70–99)
Potassium: 4.5 mEq/L (ref 3.7–5.3)
Sodium: 141 mEq/L (ref 137–147)
Total Protein: 7.2 g/dL (ref 6.0–8.3)

## 2013-05-29 MED ORDER — EZETIMIBE 10 MG PO TABS
10.0000 mg | ORAL_TABLET | Freq: Every day | ORAL | Status: DC
  Administered 2013-05-29: 10 mg via ORAL
  Filled 2013-05-29 (×2): qty 1

## 2013-05-29 MED ORDER — INSULIN ASPART 100 UNIT/ML ~~LOC~~ SOLN
0.0000 [IU] | SUBCUTANEOUS | Status: DC
  Administered 2013-05-29: 3 [IU] via SUBCUTANEOUS

## 2013-05-29 MED ORDER — CHLORHEXIDINE GLUCONATE 4 % EX LIQD
60.0000 mL | Freq: Once | CUTANEOUS | Status: DC
  Filled 2013-05-29: qty 60

## 2013-05-29 MED ORDER — CEFAZOLIN SODIUM-DEXTROSE 2-3 GM-% IV SOLR
2.0000 g | INTRAVENOUS | Status: DC
  Filled 2013-05-29: qty 50

## 2013-05-29 MED ORDER — SERTRALINE HCL 100 MG PO TABS
100.0000 mg | ORAL_TABLET | Freq: Every day | ORAL | Status: DC
  Filled 2013-05-29: qty 1

## 2013-05-29 MED ORDER — ALBUTEROL SULFATE HFA 108 (90 BASE) MCG/ACT IN AERS
1.0000 | INHALATION_SPRAY | Freq: Four times a day (QID) | RESPIRATORY_TRACT | Status: DC | PRN
  Filled 2013-05-29: qty 6.7

## 2013-05-29 MED ORDER — DOCUSATE SODIUM 100 MG PO CAPS
100.0000 mg | ORAL_CAPSULE | Freq: Two times a day (BID) | ORAL | Status: DC
  Administered 2013-05-29: 100 mg via ORAL
  Filled 2013-05-29 (×3): qty 1

## 2013-05-29 MED ORDER — BENAZEPRIL HCL 20 MG PO TABS
20.0000 mg | ORAL_TABLET | Freq: Every day | ORAL | Status: DC
  Filled 2013-05-29: qty 1

## 2013-05-29 MED ORDER — ACETAMINOPHEN 500 MG PO TABS
1000.0000 mg | ORAL_TABLET | Freq: Once | ORAL | Status: AC
  Administered 2013-05-29: 1000 mg via ORAL
  Filled 2013-05-29: qty 2

## 2013-05-29 MED ORDER — DEXTROSE-NACL 5-0.45 % IV SOLN
INTRAVENOUS | Status: AC
  Administered 2013-05-29: 21:00:00 via INTRAVENOUS

## 2013-05-29 MED ORDER — DEXTROSE-NACL 5-0.45 % IV SOLN: 100.0000 mL/h | INTRAVENOUS | Status: DC

## 2013-05-29 MED ORDER — AMLODIPINE BESY-BENAZEPRIL HCL 5-20 MG PO CAPS: 1.0000 | ORAL_CAPSULE | Freq: Every day | ORAL | Status: DC

## 2013-05-29 MED ORDER — TIOTROPIUM BROMIDE MONOHYDRATE 18 MCG IN CAPS
18.0000 ug | ORAL_CAPSULE | Freq: Every day | RESPIRATORY_TRACT | Status: DC
  Administered 2013-05-30: 18 ug via RESPIRATORY_TRACT
  Filled 2013-05-29: qty 5

## 2013-05-29 MED ORDER — AMLODIPINE BESYLATE 5 MG PO TABS
5.0000 mg | ORAL_TABLET | Freq: Every day | ORAL | Status: DC
  Filled 2013-05-29: qty 1

## 2013-05-29 MED ORDER — HYDROCODONE-ACETAMINOPHEN 5-325 MG PO TABS
1.0000 | ORAL_TABLET | Freq: Four times a day (QID) | ORAL | Status: DC | PRN
  Administered 2013-05-30: 2 via ORAL
  Filled 2013-05-29 (×2): qty 1

## 2013-05-29 MED ORDER — MORPHINE SULFATE 2 MG/ML IJ SOLN: 0.5000 mg | INTRAMUSCULAR | Status: DC | PRN

## 2013-05-29 MED ORDER — METOPROLOL TARTRATE 50 MG PO TABS
50.0000 mg | ORAL_TABLET | Freq: Two times a day (BID) | ORAL | Status: DC
  Administered 2013-05-29: 50 mg via ORAL
  Filled 2013-05-29 (×3): qty 1

## 2013-05-29 MED ORDER — CEFAZOLIN SODIUM-DEXTROSE 2-3 GM-% IV SOLR
2.0000 g | Freq: Three times a day (TID) | INTRAVENOUS | Status: DC
  Administered 2013-05-29 – 2013-05-30 (×2): 2 g via INTRAVENOUS
  Filled 2013-05-29 (×4): qty 50

## 2013-05-29 NOTE — Progress Notes (Signed)
Orthopedic Tech Progress Note Patient Details:  Eduardo [name] August 09, 1945 [id number]  Ortho Devices Ortho Device/Splint Location: ohf on bed   Jennye Moccasin 05/29/2013, 7:31 PM

## 2013-05-29 NOTE — H&P (Signed)
ORTHOPAEDIC CONSULTATION  REQUESTING PHYSICIAN: Sheral Apley, MD  Chief Complaint: Left hand wound/infection  HPI: Eduardo [name] is a 67 y.o. male who complains of  Penetrating wound to the first webspace of the L hand and purulent drainage.   Past Medical History  Diagnosis Date  . COPD (chronic obstructive pulmonary disease)   . CAD (coronary artery disease)   . Diabetes mellitus without complication   . Hypertension   . Neuromuscular disorder   . Sleep apnea   . Chemical exposure     agent orange   . PTSD (post-traumatic stress disorder)   . Diverticulitis    Past Surgical History  Procedure Laterality Date  . Spine surgery    . Cardiac catheterization    . Cholecystectomy    . Shoulder surgery    . Knee surgery      right   . Ptca      unsuccesful  . Cervical spine surgery     History   Social History  . Marital Status: Married    Spouse Name: N/A    Number of Children: N/A  . Years of Education: N/A   Social History Main Topics  . Smoking status: Former Smoker -- 0.75 packs/day for 30 years    Types: Cigarettes    Quit date: 05/31/1987  . Smokeless tobacco: Never Used  . Alcohol Use: No  . Drug Use: No  . Sexual Activity: Not on file   Other Topics Concern  . Not on file   Social History Narrative  . No narrative on file   No family history on file. Allergies  Allergen Reactions  . Statins     Body aches   Prior to Admission medications   Medication Sig Start Date End Date Taking? Authorizing Provider  ALBUTEROL SULFATE PO Take 90 mcg by mouth.    Historical Provider, MD  albuterol-ipratropium (COMBIVENT) 18-103 MCG/ACT inhaler Inhale 2 puffs into the lungs every 4 (four) hours as needed for wheezing.    Historical Provider, MD  amLODipine-benazepril (LOTREL) 5-20 MG per capsule Take 1 capsule by mouth daily.    Historical Provider, MD  aspirin 81 MG tablet Take 81 mg by mouth daily.    Historical Provider, MD  cephALEXin  (KEFLEX) 500 MG capsule Take 1 capsule (500 mg total) by mouth 2 (two) times daily. 05/28/13   Coralie Keens, FNP  ezetimibe (ZETIA) 10 MG tablet Take 10 mg by mouth daily.    Historical Provider, MD  fenofibrate micronized (LOFIBRA) 134 MG capsule Take 134 mg by mouth daily before breakfast.    Historical Provider, MD  glucose blood test strip 1 each by Other route as needed for other. Use as instructed    Historical Provider, MD  metFORMIN (GLUCOPHAGE) 1000 MG tablet Take 1,000 mg by mouth 2 (two) times daily with a meal.    Historical Provider, MD  metoprolol (LOPRESSOR) 50 MG tablet Take 50 mg by mouth 2 (two) times daily.    Historical Provider, MD  prazosin (MINIPRESS) 2 MG capsule Take 2 mg by mouth at bedtime.    Historical Provider, MD  sertraline (ZOLOFT) 100 MG tablet Take 100 mg by mouth daily.    Historical Provider, MD  sulfamethoxazole-trimethoprim (BACTRIM DS) 800-160 MG per tablet Take 1 tablet by mouth 2 (two) times daily. 05/28/13   Coralie Keens, FNP  tiotropium (SPIRIVA) 18 MCG inhalation capsule Place 18 mcg into inhaler and inhale daily.    Historical  Provider, MD   Dg Hand Complete Left  05/29/2013   CLINICAL DATA:  Left hand injury, question foreign body.  EXAM: LEFT HAND - COMPLETE 3+ VIEW  COMPARISON:  None.  FINDINGS: Degenerative changes in the IP joints and 1st carpometacarpal joint. No acute bony abnormality. Specifically, no fracture, subluxation, or dislocation. Soft tissues are intact. No visible radiopaque foreign body  IMPRESSION: No acute bony abnormality.   Electronically Signed   By: Charlett Nose M.D.   On: 05/29/2013 08:09    Positive ROS: All other systems have been reviewed and were otherwise negative with the exception of those mentioned in the HPI and as above.  Labs cbc No results found for this basename: WBC, HGB, HCT, PLT,  in the last 72 hours  Labs inflam No results found for this basename: ESR, CRP,  in the last 72 hours  Labs coag No  results found for this basename: INR, PT, PTT,  in the last 72 hours  No results found for this basename: NA, K, CL, CO2, GLUCOSE, BUN, CREATININE, CALCIUM,  in the last 72 hours  Physical Exam: Filed Vitals:   05/29/13 1404  BP: 128/70  Pulse: 60  Temp: 97.7 F (36.5 C)  Resp: 16   General: Alert, no acute distress Cardiovascular: No pedal edema Respiratory: No cyanosis, no use of accessory musculature GI: No organomegaly, abdomen is soft and non-tender Skin: No lesions in the area of chief complaint Neurologic: Sensation intact distally Psychiatric: Patient is competent for consent with normal mood and affect Lymphatic: No axillary or cervical lymphadenopathy  MUSCULOSKELETAL:  LUE: NVI, small wound with purulent drainage and surrounding erythema.  Other extremities are atraumatic with painless ROM and NVI.  Assessment: L hand infection.  Plan: Admit for IV abx and pain control, monitor over night and likely I&D in am Weight Bearing Status: NWB, elevate LEU PT VTE px: SCD's and hold chemical px until post op.    Margarita Rana, D, MD Cell [phone]   05/29/2013 3:37 PM

## 2013-05-30 ENCOUNTER — Encounter (HOSPITAL_COMMUNITY): Payer: Medicare Other | Admitting: Certified Registered"

## 2013-05-30 ENCOUNTER — Encounter (HOSPITAL_COMMUNITY): Payer: Self-pay | Admitting: Certified Registered"

## 2013-05-30 ENCOUNTER — Other Ambulatory Visit: Payer: Self-pay | Admitting: General Practice

## 2013-05-30 ENCOUNTER — Encounter (HOSPITAL_COMMUNITY)
Admission: AD | Disposition: A | Discharge status: Home / Self Care | Payer: Self-pay | Source: Ambulatory Visit | Attending: Orthopedic Surgery

## 2013-05-30 ENCOUNTER — Inpatient Hospital Stay: Admit: 2013-05-30 | Payer: Self-pay | Admitting: Orthopedic Surgery

## 2013-05-30 ENCOUNTER — Inpatient Hospital Stay (HOSPITAL_COMMUNITY): Payer: Medicare Other | Admitting: Certified Registered"

## 2013-05-30 DIAGNOSIS — I2510 Atherosclerotic heart disease of native coronary artery without angina pectoris: Secondary | ICD-10-CM | POA: Diagnosis not present

## 2013-05-30 DIAGNOSIS — J449 Chronic obstructive pulmonary disease, unspecified: Secondary | ICD-10-CM | POA: Diagnosis not present

## 2013-05-30 DIAGNOSIS — L02519 Cutaneous abscess of unspecified hand: Secondary | ICD-10-CM | POA: Diagnosis not present

## 2013-05-30 DIAGNOSIS — E119 Type 2 diabetes mellitus without complications: Secondary | ICD-10-CM | POA: Diagnosis not present

## 2013-05-30 DIAGNOSIS — S60559A Superficial foreign body of unspecified hand, initial encounter: Secondary | ICD-10-CM | POA: Diagnosis not present

## 2013-05-30 DIAGNOSIS — L089 Local infection of the skin and subcutaneous tissue, unspecified: Secondary | ICD-10-CM | POA: Diagnosis not present

## 2013-05-30 HISTORY — PX: I & D EXTREMITY: SHX5045

## 2013-05-30 LAB — GLUCOSE, CAPILLARY
Glucose-Capillary: 103 mg/dL — ABNORMAL HIGH (ref 70–99)
Glucose-Capillary: 133 mg/dL — ABNORMAL HIGH (ref 70–99)
Glucose-Capillary: 131 mg/dL — ABNORMAL HIGH (ref 70–99)
Glucose-Capillary: 107 mg/dL — ABNORMAL HIGH (ref 70–99)

## 2013-05-30 LAB — AEROBIC CULTURE

## 2013-05-30 LAB — MRSA PCR SCREENING: MRSA BY PCR: NEGATIVE

## 2013-05-30 SURGERY — IRRIGATION AND DEBRIDEMENT EXTREMITY
Anesthesia: General | Site: Hand | Laterality: Left

## 2013-05-30 MED ORDER — OXYCODONE HCL 5 MG PO TABS: 10.0000 mg | ORAL_TABLET | ORAL | Status: DC | PRN

## 2013-05-30 MED ORDER — ONDANSETRON HCL 4 MG/2ML IJ SOLN
INTRAMUSCULAR | Status: DC | PRN
  Administered 2013-05-30: 4 mg via INTRAVENOUS

## 2013-05-30 MED ORDER — SODIUM CHLORIDE 0.9 % IR SOLN
Status: DC | PRN
  Administered 2013-05-30: 1000 mL

## 2013-05-30 MED ORDER — CEPHALEXIN 500 MG PO CAPS: 500.0000 mg | ORAL_CAPSULE | Freq: Four times a day (QID) | ORAL | Status: DC

## 2013-05-30 MED ORDER — ASPIRIN 81 MG PO TABS: 81.0000 mg | ORAL_TABLET | Freq: Every day | ORAL | Status: DC

## 2013-05-30 MED ORDER — DOCUSATE SODIUM 100 MG PO CAPS: 100.0000 mg | ORAL_CAPSULE | Freq: Two times a day (BID) | ORAL | Status: DC

## 2013-05-30 MED ORDER — OXYCODONE HCL 5 MG/5ML PO SOLN: 5.0000 mg | Freq: Once | ORAL | Status: DC | PRN

## 2013-05-30 MED ORDER — FENTANYL CITRATE 0.05 MG/ML IJ SOLN
INTRAMUSCULAR | Status: DC | PRN
  Administered 2013-05-30: 50 ug via INTRAVENOUS

## 2013-05-30 MED ORDER — METOCLOPRAMIDE HCL 5 MG/ML IJ SOLN: 5.0000 mg | Freq: Three times a day (TID) | INTRAMUSCULAR | Status: DC | PRN

## 2013-05-30 MED ORDER — PROMETHAZINE HCL 25 MG/ML IJ SOLN: 6.2500 mg | INTRAMUSCULAR | Status: DC | PRN

## 2013-05-30 MED ORDER — MORPHINE SULFATE 2 MG/ML IJ SOLN: 1.0000 mg | INTRAMUSCULAR | Status: DC | PRN

## 2013-05-30 MED ORDER — METOCLOPRAMIDE HCL 10 MG PO TABS: 5.0000 mg | ORAL_TABLET | Freq: Three times a day (TID) | ORAL | Status: DC | PRN

## 2013-05-30 MED ORDER — PROPOFOL 10 MG/ML IV BOLUS
INTRAVENOUS | Status: DC | PRN
  Administered 2013-05-30: 250 mg via INTRAVENOUS

## 2013-05-30 MED ORDER — ASPIRIN 81 MG PO CHEW: 81.0000 mg | CHEWABLE_TABLET | Freq: Every day | ORAL | Status: DC

## 2013-05-30 MED ORDER — ONDANSETRON HCL 4 MG PO TABS: 4.0000 mg | ORAL_TABLET | Freq: Four times a day (QID) | ORAL | Status: DC | PRN

## 2013-05-30 MED ORDER — SULFAMETHOXAZOLE-TRIMETHOPRIM 400-80 MG PO TABS: 1.0000 | ORAL_TABLET | Freq: Two times a day (BID) | ORAL | Status: DC

## 2013-05-30 MED ORDER — CEFAZOLIN SODIUM-DEXTROSE 2-3 GM-% IV SOLR
2.0000 g | Freq: Four times a day (QID) | INTRAVENOUS | Status: DC
  Administered 2013-05-30: 2 g via INTRAVENOUS
  Filled 2013-05-30 (×3): qty 50

## 2013-05-30 MED ORDER — DEXTROSE-NACL 5-0.45 % IV SOLN
INTRAVENOUS | Status: DC
  Administered 2013-05-30: 12:00:00 via INTRAVENOUS

## 2013-05-30 MED ORDER — OXYCODONE HCL 5 MG PO TABS: 5.0000 mg | ORAL_TABLET | Freq: Once | ORAL | Status: DC | PRN

## 2013-05-30 MED ORDER — ONDANSETRON HCL 4 MG/2ML IJ SOLN: 4.0000 mg | Freq: Four times a day (QID) | INTRAMUSCULAR | Status: DC | PRN

## 2013-05-30 MED ORDER — LACTATED RINGERS IV SOLN
INTRAVENOUS | Status: DC | PRN
  Administered 2013-05-30: 09:00:00 via INTRAVENOUS

## 2013-05-30 MED ORDER — LIDOCAINE HCL (CARDIAC) 20 MG/ML IV SOLN
INTRAVENOUS | Status: DC | PRN
  Administered 2013-05-30: 80 mg via INTRAVENOUS

## 2013-05-30 SURGICAL SUPPLY — 57 items
BANDAGE ELASTIC 4 VELCRO ST LF (GAUZE/BANDAGES/DRESSINGS) ×3 IMPLANT
BANDAGE ELASTIC 6 VELCRO ST LF (GAUZE/BANDAGES/DRESSINGS) IMPLANT
BANDAGE ESMARK 6X9 LF (GAUZE/BANDAGES/DRESSINGS) ×1 IMPLANT
BANDAGE GAUZE ELAST BULKY 4 IN (GAUZE/BANDAGES/DRESSINGS) IMPLANT
BLADE SURG 10 STRL SS (BLADE) IMPLANT
BNDG COHESIVE 4X5 TAN STRL (GAUZE/BANDAGES/DRESSINGS) IMPLANT
BNDG ESMARK 6X9 LF (GAUZE/BANDAGES/DRESSINGS) ×3
BNDG GAUZE ELAST 4 BULKY (GAUZE/BANDAGES/DRESSINGS) ×3 IMPLANT
BOOTCOVER CLEANROOM LRG (PROTECTIVE WEAR) IMPLANT
CLOTH BEACON ORANGE TIMEOUT ST (SAFETY) IMPLANT
COVER SURGICAL LIGHT HANDLE (MISCELLANEOUS) ×3 IMPLANT
CUFF TOURNIQUET SINGLE 18IN (TOURNIQUET CUFF) ×3 IMPLANT
CUFF TOURNIQUET SINGLE 34IN LL (TOURNIQUET CUFF) ×3 IMPLANT
DRAPE U-SHAPE 47X51 STRL (DRAPES) ×3 IMPLANT
DRSG EMULSION OIL 3X3 NADH (GAUZE/BANDAGES/DRESSINGS) ×3 IMPLANT
DURAPREP 26ML APPLICATOR (WOUND CARE) IMPLANT
ELECT REM PT RETURN 9FT ADLT (ELECTROSURGICAL) ×3
ELECTRODE REM PT RTRN 9FT ADLT (ELECTROSURGICAL) ×1 IMPLANT
EVACUATOR 1/8 PVC DRAIN (DRAIN) IMPLANT
FACESHIELD LNG OPTICON STERILE (SAFETY) IMPLANT
GAUZE XEROFORM 1X8 LF (GAUZE/BANDAGES/DRESSINGS) IMPLANT
GLOVE BIO SURGEON STRL SZ 6.5 (GLOVE) ×2 IMPLANT
GLOVE BIO SURGEON STRL SZ7.5 (GLOVE) ×3 IMPLANT
GLOVE BIO SURGEON STRL SZ8 (GLOVE) ×6 IMPLANT
GLOVE BIO SURGEONS STRL SZ 6.5 (GLOVE) ×1
GLOVE BIOGEL M STER SZ 6 (GLOVE) ×3 IMPLANT
GLOVE BIOGEL PI IND STRL 6.5 (GLOVE) ×1 IMPLANT
GLOVE BIOGEL PI IND STRL 7.0 (GLOVE) ×1 IMPLANT
GLOVE BIOGEL PI INDICATOR 6.5 (GLOVE) ×2
GLOVE BIOGEL PI INDICATOR 7.0 (GLOVE) ×2
GLOVE ORTHO TXT STRL SZ7.5 (GLOVE) IMPLANT
GOWN PREVENTION PLUS XXLARGE (GOWN DISPOSABLE) IMPLANT
GOWN STRL NON-REIN LRG LVL3 (GOWN DISPOSABLE) ×6 IMPLANT
HANDPIECE INTERPULSE COAX TIP (DISPOSABLE)
KIT BASIN OR (CUSTOM PROCEDURE TRAY) ×3 IMPLANT
KIT ROOM TURNOVER OR (KITS) ×3 IMPLANT
MANIFOLD NEPTUNE II (INSTRUMENTS) ×3 IMPLANT
NS IRRIG 1000ML POUR BTL (IV SOLUTION) ×3 IMPLANT
PACK ORTHO EXTREMITY (CUSTOM PROCEDURE TRAY) ×3 IMPLANT
PAD ARMBOARD 7.5X6 YLW CONV (MISCELLANEOUS) ×3 IMPLANT
PENCIL BUTTON HOLSTER BLD 10FT (ELECTRODE) ×3 IMPLANT
SET CYSTO W/LG BORE CLAMP LF (SET/KITS/TRAYS/PACK) ×3 IMPLANT
SET HNDPC FAN SPRY TIP SCT (DISPOSABLE) IMPLANT
SPONGE GAUZE 4X4 12PLY (GAUZE/BANDAGES/DRESSINGS) IMPLANT
SPONGE GAUZE 4X4 12PLY STER LF (GAUZE/BANDAGES/DRESSINGS) ×3 IMPLANT
SPONGE LAP 18X18 X RAY DECT (DISPOSABLE) ×3 IMPLANT
STOCKINETTE IMPERVIOUS 9X36 MD (GAUZE/BANDAGES/DRESSINGS) IMPLANT
SUT ETHILON 3 0 PS 1 (SUTURE) ×6 IMPLANT
TOWEL OR 17X24 6PK STRL BLUE (TOWEL DISPOSABLE) IMPLANT
TOWEL OR 17X26 10 PK STRL BLUE (TOWEL DISPOSABLE) ×3 IMPLANT
TOWEL OR NON WOVEN STRL DISP B (DISPOSABLE) IMPLANT
TUBE ANAEROBIC SPECIMEN COL (MISCELLANEOUS) IMPLANT
TUBE CONNECTING 12'X1/4 (SUCTIONS) ×1
TUBE CONNECTING 12X1/4 (SUCTIONS) ×2 IMPLANT
UNDERPAD 30X30 INCONTINENT (UNDERPADS AND DIAPERS) ×6 IMPLANT
WATER STERILE IRR 1000ML POUR (IV SOLUTION) IMPLANT
YANKAUER SUCT BULB TIP NO VENT (SUCTIONS) ×3 IMPLANT

## 2013-05-30 NOTE — Op Note (Signed)
  05/29/2013 - 05/30/2013  10:20 AM  PATIENT:  Rodena Goldmann    PRE-OPERATIVE DIAGNOSIS:  Hand Infection, LEFT  POST-OPERATIVE DIAGNOSIS:  Same  PROCEDURE:  IRRIGATION AND DEBRIDEMENT Left Hand and Foreign body removal  SURGEON:  Makennah Omura, D, MD  ASSISTANT: Doran Stabler PA  ANESTHESIA:   Gen  PREOPERATIVE INDICATIONS:  Navdeep Halt is a  68 y.o. male with a diagnosis of Hand Infection, LEFT who failed conservative measures and elected for surgical management.    The risks benefits and alternatives were discussed with the patient preoperatively including but not limited to the risks of infection, bleeding, nerve injury, cardiopulmonary complications, the need for revision surgery, among others, and the patient was willing to proceed.  OPERATIVE IMPLANTS: none  OPERATIVE FINDINGS: purulent collection  BLOOD LOSS: min  COMPLICATIONS: none  TOURNIQUET TIME: 49min  OPERATIVE PROCEDURE:  Patient was identified in the preoperative holding area and site was marked by me He was transported to the operating theater and placed on the table in supine position taking care to pad all bony prominences. After a preincinduction time out anesthesia was induced. The left upper extremity was prepped and draped in normal sterile fashion and a pre-incision timeout was performed. Rodena Goldmann received ancef for preoperative antibiotics.   I extended his of the drain wound both proximally and distally to the dominant flexor crease and then obliquely crossing a MP crease of the finger. There is soft tissue flap and retracted this. A meal he noticed purulent fluid drained from the more superficial aspect of the wound this was completely drained and irrigated out. I then probed distal and proximal did not notice any other pockets appearance. I then dissected down using spreading dissection identified the radial digital nerve to the index finger was found to be without harm.  Identified the flexor tendon sheath we're roughly at the level of the A1 pulley he was relatively Lorenda Cahill said to perform partial release of this point and did not notice any purulent fluid within the canal of the flexor tendon. I explored more ulnarly across the soft tissue did not notice any other purulent areas. Again visualized the digital nerve it was found to be without harm. I then thoroughly irrigated the irrigated the wound with 2 L of normal saline. I close the skin with an interrupted nylon stitch. This was done loosely to allow drainage if needed. I then placed a sterile bulky dressing with gauze in between the fingers. He was awoken and taken to PACU in stable condition.   POST OPERATIVE PLAN: To keep his dressings clean dry and intact until followup initiate by mouth Keflex and followup cultures obtained in the office and resume his aspirin for DVT prophylaxis. As well as ambulation.

## 2013-05-30 NOTE — Anesthesia Postprocedure Evaluation (Signed)
  Anesthesia Post-op Note  Patient: Eduardo [name]  Procedure(s) Performed: Procedure(s): IRRIGATION AND DEBRIDEMENT Left Hand and Foreign body removal (Left)  Patient Location: PACU  Anesthesia Type:General  Level of Consciousness: awake and alert   Airway and Oxygen Therapy: Patient Spontanous Breathing  Post-op Pain: mild  Post-op Assessment: Post-op Vital signs reviewed  Post-op Vital Signs: stable  Complications: No apparent anesthesia complications

## 2013-05-30 NOTE — Discharge Instructions (Signed)
Return to my clinic first thing Monday morning for a re-check  Keep dressings clean and dry until then.   Elevate the hand  Take antibiotics as perscribed  Resume your aspirin 81mg  daily

## 2013-05-30 NOTE — Anesthesia Preprocedure Evaluation (Addendum)
Anesthesia Evaluation  Patient identified by MRN, date of birth, ID band Patient awake    Reviewed: Allergy & Precautions, H&P , NPO status , Patient's Chart, lab work & pertinent test results, reviewed documented beta blocker date and time   History of Anesthesia Complications Negative for: history of anesthetic complications  Airway Mallampati: II TM Distance: >3 FB Neck ROM: Full    Dental  (+) Teeth Intact and Dental Advisory Given   Pulmonary sleep apnea , COPDformer smoker,  breath sounds clear to auscultation        Cardiovascular hypertension, Pt. on medications and Pt. on home beta blockers + CAD Rhythm:Regular Rate:Normal     Neuro/Psych    GI/Hepatic   Endo/Other  diabetes, Type 2, Oral Hypoglycemic Agents  Renal/GU      Musculoskeletal   Abdominal   Peds  Hematology   Anesthesia Other Findings   Reproductive/Obstetrics                          Anesthesia Physical Anesthesia Plan  ASA: III  Anesthesia Plan: General   Post-op Pain Management:    Induction: Intravenous  Airway Management Planned: LMA  Additional Equipment:   Intra-op Plan:   Post-operative Plan: Extubation in OR  Informed Consent: I have reviewed the patients History and Physical, chart, labs and discussed the procedure including the risks, benefits and alternatives for the proposed anesthesia with the patient or authorized representative who has indicated his/her understanding and acceptance.   Dental advisory given  Plan Discussed with: Anesthesiologist and Surgeon  Anesthesia Plan Comments:         Anesthesia Quick Evaluation

## 2013-05-30 NOTE — Progress Notes (Signed)
Patient discharged to home accompanied by wife. Discharge instructions and rx given and explained and patient stated understanding. Patients IV was removed and he left unit in a stable condition.

## 2013-05-30 NOTE — Discharge Summary (Signed)
Physician Discharge Summary  Patient ID: Eduardo [name] MRN: [id number] DOB/AGE: 1946-05-14 68 y.o.  Admit date: 05/29/2013 Discharge date: 05/30/2013  Admission Diagnoses:  <principal problem not specified>  Discharge Diagnoses:  Active Problems:   Infected hand   Past Medical History  Diagnosis Date  . COPD (chronic obstructive pulmonary disease)   . CAD (coronary artery disease)   . Diabetes mellitus without complication   . Hypertension   . Neuromuscular disorder   . Sleep apnea   . Chemical exposure     agent orange   . PTSD (post-traumatic stress disorder)   . Diverticulitis     Surgeries: Procedure(s): IRRIGATION AND DEBRIDEMENT Left Hand and Foreign body removal on 05/29/2013 - 05/30/2013   Consultants (if any):    Discharged Condition: Improved  Hospital Course: Eduardo [name] is an 68 y.o. male who was admitted 05/29/2013 with a diagnosis of <principal problem not specified> and went to the operating room on 05/29/2013 - 05/30/2013 and underwent the above named procedures.    He was given perioperative antibiotics:  Anti-infectives   Start     Dose/Rate Route Frequency Ordered Stop   05/30/13 0600  ceFAZolin (ANCEF) IVPB 2 g/50 mL premix  Status:  Discontinued     2 g 100 mL/hr over 30 Minutes Intravenous On call to O.R. 05/29/13 1534 05/29/13 1547   05/30/13 0000  cephALEXin (KEFLEX) 500 MG capsule     500 mg Oral 4 times daily 05/30/13 1033     05/29/13 1700  ceFAZolin (ANCEF) IVPB 2 g/50 mL premix     2 g 100 mL/hr over 30 Minutes Intravenous Every 8 hours 05/29/13 1543      .  He was given sequential compression devices, early ambulation, and ASA81 for DVT prophylaxis.  He benefited maximally from the hospital stay and there were no complications.    Recent vital signs:  Filed Vitals:   05/30/13 1030  BP:   Pulse: 58  Temp: 97 F (36.1 C)  Resp: 17    Recent laboratory studies:  Lab Results  Component Value Date   HGB 15.8  05/29/2013   Lab Results  Component Value Date   WBC 8.0 05/29/2013   PLT 221 05/29/2013   No results found for this basename: INR   Lab Results  Component Value Date   NA 141 05/29/2013   K 4.5 05/29/2013   CL 101 05/29/2013   CO2 27 05/29/2013   BUN 19 05/29/2013   CREATININE 0.73 05/29/2013   GLUCOSE 95 05/29/2013    Discharge Medications:     Medication List    STOP taking these medications       sulfamethoxazole-trimethoprim 800-160 MG per tablet  Commonly known as:  BACTRIM DS      TAKE these medications       albuterol 108 (90 BASE) MCG/ACT inhaler  Commonly known as:  PROVENTIL HFA;VENTOLIN HFA  Inhale 1-2 puffs into the lungs every 6 (six) hours as needed for wheezing or shortness of breath.     amLODipine-benazepril 5-20 MG per capsule  Commonly known as:  LOTREL  Take 1 capsule by mouth daily.     aspirin EC 81 MG tablet  Take 81 mg by mouth daily.     aspirin 81 MG tablet  Take 1 tablet (81 mg total) by mouth daily.     cephALEXin 500 MG capsule  Commonly known as:  KEFLEX  Take 500 mg by mouth 2 (two) times daily.  cephALEXin 500 MG capsule  Commonly known as:  KEFLEX  Take 1 capsule (500 mg total) by mouth 4 (four) times daily.     docusate sodium 100 MG capsule  Commonly known as:  COLACE  Take 1 capsule (100 mg total) by mouth 2 (two) times daily. Continue this while taking narcotics to help with bowel movements     ezetimibe 10 MG tablet  Commonly known as:  ZETIA  Take 10 mg by mouth daily.     fenofibrate micronized 134 MG capsule  Commonly known as:  LOFIBRA  Take 134 mg by mouth daily before breakfast.     metFORMIN 1000 MG tablet  Commonly known as:  GLUCOPHAGE  Take 1,000 mg by mouth 2 (two) times daily with a meal.     metoprolol 50 MG tablet  Commonly known as:  LOPRESSOR  Take 50 mg by mouth 2 (two) times daily.     oxyCODONE 5 MG immediate release tablet  Commonly known as:  ROXICODONE  Take 2 tablets (10 mg  total) by mouth every 4 (four) hours as needed for severe pain.     sertraline 100 MG tablet  Commonly known as:  ZOLOFT  Take 100 mg by mouth daily.     tiotropium 18 MCG inhalation capsule  Commonly known as:  SPIRIVA  Place 18 mcg into inhaler and inhale daily.     Vitamin D 2000 UNITS tablet  Take 2,000 Units by mouth daily.        Diagnostic Studies: Dg Hand Complete Left  05/29/2013   CLINICAL DATA:  Left hand injury, question foreign body.  EXAM: LEFT HAND - COMPLETE 3+ VIEW  COMPARISON:  None.  FINDINGS: Degenerative changes in the IP joints and 1st carpometacarpal joint. No acute bony abnormality. Specifically, no fracture, subluxation, or dislocation. Soft tissues are intact. No visible radiopaque foreign body  IMPRESSION: No acute bony abnormality.   Electronically Signed   By: Rolm Baptise M.D.   On: 05/29/2013 08:09    Disposition: Final discharge disposition not confirmed       Future Appointments Provider Department Dept Phone   06/21/2013 8:40 [place], [place] [phone]      Follow-up Information   Follow up with [name], MD On 06/03/2013. Adelfa Koh)    Specialty:  Orthopedic Surgery   Contact information:   [address] [place] [id number] [phone]        Signed: Edmonia Lynch, D 05/30/2013, 10:35 AM

## 2013-05-30 NOTE — Transfer of Care (Signed)
Immediate Anesthesia Transfer of Care Note  Patient: Eduardo [name]  Procedure(s) Performed: Procedure(s): IRRIGATION AND DEBRIDEMENT Left Hand and Foreign body removal (Left)  Patient Location: PACU  Anesthesia Type:General  Level of Consciousness: awake, alert , oriented and patient cooperative  Airway & Oxygen Therapy: Patient Spontanous Breathing and Patient connected to nasal cannula oxygen  Post-op Assessment: Report given to PACU RN and Post -op Vital signs reviewed and stable  Post vital signs: Reviewed and stable  Complications: No apparent anesthesia complications

## 2013-06-03 ENCOUNTER — Encounter (HOSPITAL_COMMUNITY): Payer: Self-pay | Admitting: Orthopedic Surgery

## 2013-06-10 DIAGNOSIS — M545 Low back pain, unspecified: Secondary | ICD-10-CM | POA: Diagnosis not present

## 2013-06-21 ENCOUNTER — Encounter: Payer: Self-pay | Admitting: General Practice

## 2013-06-21 ENCOUNTER — Ambulatory Visit (INDEPENDENT_AMBULATORY_CARE_PROVIDER_SITE_OTHER): Payer: Medicare Other | Admitting: General Practice

## 2013-06-21 VITALS — BP 134/76 | HR 59 | Temp 98.3°F | Ht 68.0 in | Wt 197.0 lb

## 2013-06-21 DIAGNOSIS — E785 Hyperlipidemia, unspecified: Secondary | ICD-10-CM

## 2013-06-21 DIAGNOSIS — J069 Acute upper respiratory infection, unspecified: Secondary | ICD-10-CM

## 2013-06-21 DIAGNOSIS — I10 Essential (primary) hypertension: Secondary | ICD-10-CM | POA: Diagnosis not present

## 2013-06-21 MED ORDER — FENOFIBRATE MICRONIZED 134 MG PO CAPS: 134.0000 mg | ORAL_CAPSULE | Freq: Every day | ORAL | Status: DC

## 2013-06-21 MED ORDER — AZITHROMYCIN 250 MG PO TABS: ORAL_TABLET | ORAL | Status: DC

## 2013-06-21 MED ORDER — AMLODIPINE BESY-BENAZEPRIL HCL 5-20 MG PO CAPS: 1.0000 | ORAL_CAPSULE | Freq: Every day | ORAL | Status: DC

## 2013-06-21 MED ORDER — METOPROLOL TARTRATE 50 MG PO TABS: 50.0000 mg | ORAL_TABLET | Freq: Two times a day (BID) | ORAL | Status: DC

## 2013-06-21 NOTE — Patient Instructions (Signed)

## 2013-06-21 NOTE — Progress Notes (Signed)
   Subjective:    Patient ID: Eduardo [name], male    DOB: [birth year], 68 y.o.   MRN: [id number]  HPI Patient presents today for chronic health follow up. [facility]. He reports eating a healthy and regular exercise.     Review of Systems  Constitutional: Negative for fever and chills.  Respiratory: Negative for chest tightness, shortness of breath and wheezing.   Cardiovascular: Negative for chest pain and palpitations.  Gastrointestinal: Negative for nausea, vomiting, abdominal pain, diarrhea, constipation and blood in stool.  Genitourinary: Negative for hematuria and difficulty urinating.  Neurological: Negative for dizziness, weakness and headaches.       Objective:   Physical Exam  Constitutional: He is oriented to person, place, and time. He appears well-developed and well-nourished.  HENT:  Head: Normocephalic and atraumatic.  Right Ear: External ear normal.  Left Ear: External ear normal.  Nose: Nose normal.  Mouth/Throat: Oropharynx is clear and moist.  Eyes: Conjunctivae and EOM are normal. Pupils are equal, round, and reactive to light.  Neck: Normal range of motion. Neck supple. No thyromegaly present.  Cardiovascular: Normal rate, regular rhythm and normal heart sounds.   Pulmonary/Chest: Effort normal and breath sounds normal. No respiratory distress. He exhibits no tenderness.  Abdominal: Soft. Bowel sounds are normal. He exhibits no distension. There is no tenderness.  Lymphadenopathy:    He has no cervical adenopathy.  Neurological: He is alert and oriented to person, place, and time.  Skin: Skin is warm and dry.  Psychiatric: He has a normal mood and affect.          Assessment & Plan:  1. Upper respiratory infection  - azithromycin (ZITHROMAX) 250 MG tablet; Take as directed  Dispense: 6 tablet; Refill: 0  2. Hypertension  - amLODipine-benazepril (LOTREL) 5-20 MG per capsule; Take 1  capsule by mouth daily.  Dispense: 90 capsule; Refill: 3 - metoprolol (LOPRESSOR) 50 MG tablet; Take 1 tablet (50 mg total) by mouth 2 (two) times daily.  Dispense: 180 tablet; Refill: 3  3. Hyperlipidemia  - fenofibrate micronized (LOFIBRA) 134 MG capsule; Take 1 capsule (134 mg total) by mouth daily before breakfast.  Dispense: 90 capsule; Refill: 3 -Continue all current medications -patient provided copy of labs, drawn by [facility] hospital  F/u in 3 months Discussed benefits of regular exercise and healthy eating Patient verbalized understanding Erby Pian, FNP-C

## 2013-07-01 DIAGNOSIS — M25519 Pain in unspecified shoulder: Secondary | ICD-10-CM | POA: Diagnosis not present

## 2013-07-11 DIAGNOSIS — M545 Low back pain, unspecified: Secondary | ICD-10-CM | POA: Diagnosis not present

## 2013-07-11 DIAGNOSIS — IMO0002 Reserved for concepts with insufficient information to code with codable children: Secondary | ICD-10-CM | POA: Diagnosis not present

## 2013-07-11 DIAGNOSIS — M5137 Other intervertebral disc degeneration, lumbosacral region: Secondary | ICD-10-CM | POA: Diagnosis not present

## 2013-07-20 DIAGNOSIS — M47817 Spondylosis without myelopathy or radiculopathy, lumbosacral region: Secondary | ICD-10-CM | POA: Diagnosis not present

## 2013-07-24 ENCOUNTER — Telehealth: Payer: Self-pay | Admitting: General Practice

## 2013-07-24 ENCOUNTER — Other Ambulatory Visit: Payer: Self-pay | Admitting: General Practice

## 2013-07-24 DIAGNOSIS — M25511 Pain in right shoulder: Secondary | ICD-10-CM

## 2013-07-24 DIAGNOSIS — M47817 Spondylosis without myelopathy or radiculopathy, lumbosacral region: Secondary | ICD-10-CM | POA: Diagnosis not present

## 2013-07-24 MED ORDER — MELOXICAM 15 MG PO TABS: 15.0000 mg | ORAL_TABLET | Freq: Every day | ORAL | Status: DC

## 2013-07-24 NOTE — Telephone Encounter (Signed)
Please advise 

## 2013-07-24 NOTE — Telephone Encounter (Signed)
Script written

## 2013-07-29 DIAGNOSIS — M47817 Spondylosis without myelopathy or radiculopathy, lumbosacral region: Secondary | ICD-10-CM | POA: Diagnosis not present

## 2013-07-29 DIAGNOSIS — M5137 Other intervertebral disc degeneration, lumbosacral region: Secondary | ICD-10-CM | POA: Diagnosis not present

## 2013-07-29 DIAGNOSIS — M545 Low back pain, unspecified: Secondary | ICD-10-CM | POA: Diagnosis not present

## 2013-08-06 ENCOUNTER — Telehealth: Payer: Self-pay | Admitting: *Deleted

## 2013-08-06 NOTE — Telephone Encounter (Signed)
Left message to return call.  Checking to see if any recent cholesterol labs done.

## 2013-08-06 NOTE — Telephone Encounter (Signed)
Message copied by Laurine Blazer on Tue Aug 06, 2013  2:41 PM ------      Message from: Laurine Blazer      Created: Wed Apr 17, 2013  8:49 AM      Regarding: LABS DUE        LIPIDS - 3-4 MONTHS  ------

## 2013-08-20 NOTE — Telephone Encounter (Signed)
No return call noted to present date.

## 2013-08-26 DIAGNOSIS — E7800 Pure hypercholesterolemia, unspecified: Secondary | ICD-10-CM | POA: Diagnosis not present

## 2013-08-26 DIAGNOSIS — I10 Essential (primary) hypertension: Secondary | ICD-10-CM | POA: Diagnosis not present

## 2013-08-26 DIAGNOSIS — E119 Type 2 diabetes mellitus without complications: Secondary | ICD-10-CM | POA: Diagnosis not present

## 2013-08-26 DIAGNOSIS — J309 Allergic rhinitis, unspecified: Secondary | ICD-10-CM | POA: Diagnosis not present

## 2013-09-20 ENCOUNTER — Ambulatory Visit (INDEPENDENT_AMBULATORY_CARE_PROVIDER_SITE_OTHER): Payer: Medicare Other | Admitting: General Practice

## 2013-09-20 ENCOUNTER — Encounter: Payer: Self-pay | Admitting: General Practice

## 2013-09-20 VITALS — BP 144/87 | HR 58 | Temp 97.0°F | Ht 68.0 in | Wt 199.8 lb

## 2013-09-20 DIAGNOSIS — J449 Chronic obstructive pulmonary disease, unspecified: Secondary | ICD-10-CM

## 2013-09-20 DIAGNOSIS — E119 Type 2 diabetes mellitus without complications: Secondary | ICD-10-CM

## 2013-09-20 DIAGNOSIS — I10 Essential (primary) hypertension: Secondary | ICD-10-CM | POA: Diagnosis not present

## 2013-09-20 DIAGNOSIS — J4489 Other specified chronic obstructive pulmonary disease: Secondary | ICD-10-CM

## 2013-09-20 DIAGNOSIS — H6690 Otitis media, unspecified, unspecified ear: Secondary | ICD-10-CM | POA: Diagnosis not present

## 2013-09-20 DIAGNOSIS — H6691 Otitis media, unspecified, right ear: Secondary | ICD-10-CM

## 2013-09-20 DIAGNOSIS — E785 Hyperlipidemia, unspecified: Secondary | ICD-10-CM

## 2013-09-20 DIAGNOSIS — F4310 Post-traumatic stress disorder, unspecified: Secondary | ICD-10-CM

## 2013-09-20 MED ORDER — AMOXICILLIN 500 MG PO CAPS: 500.0000 mg | ORAL_CAPSULE | Freq: Two times a day (BID) | ORAL | Status: DC

## 2013-09-20 NOTE — Progress Notes (Signed)
   Subjective:    Patient ID: Eduardo [name], male    DOB: 1945-06-07, 68 y.o.   MRN: [id number]  HPI Patient presents today for chronic health follow up. History of Htn and HLD, which are managed by this office. He also has a history of Diabetes, COPD, and PTSD which are managed by the [place]. Taking medications as directed. Taking blood pressures at home and range 120-130's/70's. Reports eating healthy and physically active. Reports having right ear pain for past 2-3 days, denies change in hearing or known trauma.     Review of Systems  Constitutional: Negative for fever and chills.  HENT: Positive for ear pain. Negative for congestion and sore throat.   Respiratory: Negative for chest tightness and shortness of breath.   Cardiovascular: Negative for chest pain and palpitations.  Gastrointestinal: Negative for nausea, vomiting, abdominal pain, diarrhea, constipation and blood in stool.  Genitourinary: Negative for difficulty urinating.  Neurological: Negative for dizziness, weakness and headaches.       Objective:   Physical Exam  Constitutional: He is oriented to person, place, and time. He appears well-developed and well-nourished.  HENT:  Head: Normocephalic and atraumatic.  Right Ear: Tympanic membrane is erythematous.  Left Ear: External ear normal.  Mouth/Throat: Oropharynx is clear and moist.  Neck: Normal range of motion. Neck supple. No thyromegaly present.  Cardiovascular: Regular rhythm and normal heart sounds.   Pulmonary/Chest: Effort normal and breath sounds normal. No respiratory distress. He exhibits no tenderness.  Abdominal: Soft. Bowel sounds are normal. He exhibits no distension. There is no tenderness.  Lymphadenopathy:    He has no cervical adenopathy.  Neurological: He is alert and oriented to person, place, and time.  Skin: Skin is warm and dry.  Psychiatric: He has a normal mood and affect.          Assessment & Plan:  1. Hyperlipidemia  - Lipid  panel  2. Hypertension  - CMP14+EGFR  3. Right otitis media  - amoxicillin (AMOXIL) 500 MG capsule; Take 1 capsule (500 mg total) by mouth 2 (two) times daily.  Dispense: 20 capsule; Refill: 0 -Continue all current medications Labs pending F/u in 3 months Discussed benefits of regular exercise and healthy eating Patient verbalized understanding Erby Pian, FNP-C

## 2013-09-20 NOTE — Patient Instructions (Signed)

## 2013-09-21 DIAGNOSIS — J449 Chronic obstructive pulmonary disease, unspecified: Secondary | ICD-10-CM | POA: Insufficient documentation

## 2013-09-21 DIAGNOSIS — F4310 Post-traumatic stress disorder, unspecified: Secondary | ICD-10-CM | POA: Insufficient documentation

## 2013-09-21 DIAGNOSIS — E119 Type 2 diabetes mellitus without complications: Secondary | ICD-10-CM | POA: Insufficient documentation

## 2013-09-21 LAB — CMP14+EGFR
ALBUMIN: 4.4 g/dL (ref 3.6–4.8)
ALK PHOS: 47 IU/L (ref 39–117)
ALT: 22 IU/L (ref 0–44)
AST: 19 IU/L (ref 0–40)
Albumin/Globulin Ratio: 1.9 (ref 1.1–2.5)
BILIRUBIN TOTAL: 0.5 mg/dL (ref 0.0–1.2)
BUN/Creatinine Ratio: 26 — ABNORMAL HIGH (ref 10–22)
BUN: 20 mg/dL (ref 8–27)
CO2: 28 mmol/L (ref 18–29)
Calcium: 9.2 mg/dL (ref 8.6–10.2)
Chloride: 100 mmol/L (ref 97–108)
Creatinine, Ser: 0.76 mg/dL (ref 0.76–1.27)
GFR, EST AFRICAN AMERICAN: 108 mL/min/{1.73_m2} (ref >59)
GFR, EST NON AFRICAN AMERICAN: 94 mL/min/{1.73_m2} (ref >59)
GLOBULIN, TOTAL: 2.3 g/dL (ref 1.5–4.5)
Glucose: 112 mg/dL — ABNORMAL HIGH (ref 65–99)
Potassium: 5.1 mmol/L (ref 3.5–5.2)
Sodium: 141 mmol/L (ref 134–144)
Total Protein: 6.7 g/dL (ref 6.0–8.5)

## 2013-09-21 LAB — LIPID PANEL
CHOL/HDL RATIO: 4.1 ratio (ref 0.0–5.0)
Cholesterol, Total: 147 mg/dL (ref 100–199)
HDL: 36 mg/dL — ABNORMAL LOW (ref >39)
LDL CALC: 68 mg/dL (ref 0–99)
TRIGLYCERIDES: 215 mg/dL — AB (ref 0–149)
VLDL Cholesterol Cal: 43 mg/dL — ABNORMAL HIGH (ref 5–40)

## 2013-09-27 ENCOUNTER — Other Ambulatory Visit: Payer: Self-pay | Admitting: General Practice

## 2013-10-22 ENCOUNTER — Encounter: Payer: Self-pay | Admitting: Cardiovascular Disease

## 2013-10-22 ENCOUNTER — Ambulatory Visit (INDEPENDENT_AMBULATORY_CARE_PROVIDER_SITE_OTHER): Payer: Medicare Other | Admitting: Cardiovascular Disease

## 2013-10-22 VITALS — BP 138/79 | HR 58 | Ht 68.0 in | Wt 200.0 lb

## 2013-10-22 DIAGNOSIS — I2510 Atherosclerotic heart disease of native coronary artery without angina pectoris: Secondary | ICD-10-CM

## 2013-10-22 DIAGNOSIS — I10 Essential (primary) hypertension: Secondary | ICD-10-CM

## 2013-10-22 DIAGNOSIS — E119 Type 2 diabetes mellitus without complications: Secondary | ICD-10-CM

## 2013-10-22 DIAGNOSIS — E785 Hyperlipidemia, unspecified: Secondary | ICD-10-CM

## 2013-10-22 NOTE — Progress Notes (Signed)
Patient ID: Eduardo [name], male   DOB: 1945-12-22, 68 y.o.   MRN: [id number]      SUBJECTIVE: [name] is a 68 yr old man with CAD, COPD, HTN , hyperlipidemia, diabetes, and PTSD.  He had previously told me he had multiple balloon angioplasties in either late 2013 or early 2014, but denies having stents. He said he had "7 blockages". He describes what sounds like a stenosis at a bifurcation (LAD and diagonal?).  He denies having had an MI.  He had agent orange exposure in [place] for 3 years.  He has diabetes with neuropathy.   He denies chest pain, palpitations, lightheadedness, leg swelling, and syncope.  Prior to PTCA, his primary symptom was chest pain. He previously told me he had "been on every statin they have including the new ones", and reports intolerance to all statins, experiencing significant joint and muscle pain.  Lipids on 09/20/2013 demonstrated LDL 68, HDL 36, triglycerides 215.  His dose of metformin was reduced to 500 mg twice daily and he had previously been taking 1000 mg twice daily, and he is very concerned about this as his cardiologist in [place] had prescribed this. He is also inquiring about family care providers.     Allergies  Allergen Reactions  . Statins     Body aches    Current Outpatient Prescriptions  Medication Sig Dispense Refill  . albuterol (PROVENTIL HFA;VENTOLIN HFA) 108 (90 BASE) MCG/ACT inhaler Inhale 1-2 puffs into the lungs every 6 (six) hours as needed for wheezing or shortness of breath.      [name] amLODipine-benazepril (LOTREL) 5-20 MG per capsule Take 1 capsule by mouth daily.  90 capsule  3  . amoxicillin (AMOXIL) 500 MG capsule Take 1 capsule (500 mg total) by mouth 2 (two) times daily.  20 capsule  0  . aspirin EC 81 MG tablet Take 81 mg by mouth daily.      . Cholecalciferol (VITAMIN D) 2000 UNITS tablet Take 2,000 Units by mouth daily.      [name] docusate sodium (COLACE) 100 MG capsule Take 1 capsule (100 mg total) by  mouth 2 (two) times daily. Continue this while taking narcotics to help with bowel movements  30 capsule  1  . ezetimibe (ZETIA) 10 MG tablet Take 10 mg by mouth daily.      . fenofibrate micronized (LOFIBRA) 134 MG capsule Take 1 capsule (134 mg total) by mouth daily before breakfast.  90 capsule  3  . meloxicam (MOBIC) 15 MG tablet Take 1 tablet (15 mg total) by mouth daily.  90 tablet  3  . metFORMIN (GLUCOPHAGE) 1000 MG tablet Take 1,000 mg by mouth 2 (two) times daily with a meal.      . metoprolol (LOPRESSOR) 50 MG tablet Take 1 tablet (50 mg total) by mouth 2 (two) times daily.  180 tablet  3  . sertraline (ZOLOFT) 100 MG tablet Take 100 mg by mouth daily.      [name] tiotropium (SPIRIVA) 18 MCG inhalation capsule Place 18 mcg into inhaler and inhale daily.       No current facility-administered medications for this visit.    Past Medical History  Diagnosis Date  . COPD (chronic obstructive pulmonary disease)   . CAD (coronary artery disease)   . Diabetes mellitus without complication   . Hypertension   . Neuromuscular disorder   . Sleep apnea   . Chemical exposure     agent orange   . PTSD (post-traumatic stress  disorder)   . Diverticulitis     Past Surgical History  Procedure Laterality Date  . Spine surgery    . Cardiac catheterization    . Cholecystectomy    . Shoulder surgery    . Knee surgery      right   . Ptca      unsuccesful  . Cervical spine surgery    . I&d extremity Left 05/30/2013    Procedure: IRRIGATION AND DEBRIDEMENT Left Hand and Foreign body removal;  Surgeon: [name], MD;  Location: [place];  Service: Orthopedics;  Laterality: Left;  [name] Eye surgery Right     cataract    History   Social History  . Marital Status: Married    Spouse Name: N/A    Number of Children: N/A  . Years of Education: N/A   Occupational History  . Not on file.   Social History Main Topics  . Smoking status: Former Smoker -- 0.75 packs/day for 30 years    Types:  Cigarettes    Quit date: 05/31/1987  . Smokeless tobacco: Never Used  . Alcohol Use: No  . Drug Use: No  . Sexual Activity: Not on file   Other Topics Concern  . Not on file   Social History Narrative  . No narrative on file     Filed Vitals:   10/22/13 1039  BP: 138/79  Pulse: 58  Height: 5\' 8"  (1.727 m)  Weight: 200 lb (90.719 kg)    PHYSICAL EXAM General: NAD Neck: No JVD, no thyromegaly. Lungs: Clear to auscultation bilaterally with normal respiratory effort. CV: Nondisplaced PMI.  Regular rate and rhythm, normal S1/S2, no S3/S4, no murmur. No pretibial or periankle edema.  No carotid bruit.  Normal pedal pulses.  Abdomen: Soft, nontender, no hepatosplenomegaly, no distention.  Neurologic: Alert and oriented x 3.  Psych: Normal affect. Extremities: No clubbing or cyanosis.   ECG: reviewed and available in electronic records.      ASSESSMENT AND PLAN: 1. CAD: Symptomatically stable. On ASA and metoprolol. No indication for noninvasive cardiac testing at this time. 2. HTN: Controlled on current therapy which includes Lotrel.  3. Hyperlipidemia: On Zetia. Intolerant to statins. Lipids as noted above. 4. Diabetes mellitus: I have asked my nurse to attempt to find out why the dose of metformin was reduced.  Dispo: f/u 6 months.  [name], M.D., F.A.C.C.

## 2013-10-22 NOTE — Patient Instructions (Signed)
Continue all current medications. Your physician wants you to follow up in: 6 months.  You will receive a reminder letter in the mail one-two months in advance.  If you don't receive a letter, please call our office to schedule the follow up appointment  Primary care physician list provided

## 2013-12-23 ENCOUNTER — Ambulatory Visit (INDEPENDENT_AMBULATORY_CARE_PROVIDER_SITE_OTHER): Payer: Medicare Other | Admitting: Family Medicine

## 2013-12-23 VITALS — BP 130/78 | HR 61 | Temp 96.8°F | Ht 68.0 in | Wt 202.0 lb

## 2013-12-23 DIAGNOSIS — T148 Other injury of unspecified body region: Secondary | ICD-10-CM

## 2013-12-23 DIAGNOSIS — W57XXXA Bitten or stung by nonvenomous insect and other nonvenomous arthropods, initial encounter: Secondary | ICD-10-CM

## 2013-12-23 MED ORDER — DOXYCYCLINE HYCLATE 100 MG PO TABS: 100.0000 mg | ORAL_TABLET | Freq: Two times a day (BID) | ORAL | Status: DC

## 2013-12-23 NOTE — Progress Notes (Signed)
   Subjective:    Patient ID: Shayde Gervacio, male    DOB: 12-07-1945, 68 y.o.   MRN: [id number]  HPI  This 68 y.o. male presents for evaluation of fatigue and weakness.  He has been bitten by ticks And states he thinks he has lymes disease.  Review of Systems    No chest pain, SOB, HA, dizziness, vision change, N/V, diarrhea, constipation, dysuria, urinary urgency or frequency, myalgias, arthralgias or rash.  Objective:   Physical Exam  Vital signs noted  Well developed well nourished male.  HEENT - Head atraumatic Normocephalic                Eyes - PERRLA, Conjuctiva - clear Sclera- Clear EOMI                Ears - EAC's Wnl TM's Wnl Gross Hearing WNL                Throat - oropharanx wnl Respiratory - Lungs CTA bilateral Cardiac - RRR S1 and S2 without murmur GI - Abdomen soft Nontender and bowel sounds active x 4       Assessment & Plan:  Tick bite - Plan: doxycycline (VIBRA-TABS) 100 MG tablet, Lyme Ab/Western Blot Reflex, Rocky mtn spotted fvr abs pnl(IgG+IgM)  Lysbeth Penner FNP

## 2013-12-26 LAB — RMSF, IGG, IFA: RMSF, IGG, IFA: 1:64 {titer} — ABNORMAL HIGH

## 2013-12-26 LAB — LYME AB/WESTERN BLOT REFLEX
LYME DISEASE AB, QUANT, IGM: <0.8 index (ref 0.00–0.79)
Lyme IgG/IgM Ab: <0.91 {ISR} (ref 0.00–0.90)

## 2013-12-26 LAB — ROCKY MTN SPOTTED FVR ABS PNL(IGG+IGM)
RMSF IgG: POSITIVE — AB
RMSF IgM: 0.26 index (ref 0.00–0.89)

## 2013-12-27 DIAGNOSIS — E782 Mixed hyperlipidemia: Secondary | ICD-10-CM | POA: Diagnosis not present

## 2013-12-27 DIAGNOSIS — I10 Essential (primary) hypertension: Secondary | ICD-10-CM | POA: Diagnosis not present

## 2013-12-27 DIAGNOSIS — E119 Type 2 diabetes mellitus without complications: Secondary | ICD-10-CM | POA: Diagnosis not present

## 2014-02-28 DIAGNOSIS — Z23 Encounter for immunization: Secondary | ICD-10-CM | POA: Diagnosis not present

## 2014-03-09 DIAGNOSIS — E780 Pure hypercholesterolemia: Secondary | ICD-10-CM | POA: Diagnosis not present

## 2014-03-09 DIAGNOSIS — J449 Chronic obstructive pulmonary disease, unspecified: Secondary | ICD-10-CM | POA: Diagnosis not present

## 2014-03-09 DIAGNOSIS — I10 Essential (primary) hypertension: Secondary | ICD-10-CM | POA: Diagnosis not present

## 2014-03-09 DIAGNOSIS — E119 Type 2 diabetes mellitus without complications: Secondary | ICD-10-CM | POA: Diagnosis not present

## 2014-03-09 DIAGNOSIS — Z79899 Other long term (current) drug therapy: Secondary | ICD-10-CM | POA: Diagnosis not present

## 2014-03-17 ENCOUNTER — Telehealth: Payer: Self-pay | Admitting: *Deleted

## 2014-03-17 ENCOUNTER — Inpatient Hospital Stay (HOSPITAL_COMMUNITY)
Admission: AD | Admit: 2014-03-17 | Discharge: 2014-03-19 | DRG: 287 | Disposition: A | Discharge status: Home / Self Care | Payer: Medicare Other | Source: Other Acute Inpatient Hospital | Attending: Cardiology | Admitting: Cardiology

## 2014-03-17 DIAGNOSIS — R001 Bradycardia, unspecified: Secondary | ICD-10-CM | POA: Diagnosis present

## 2014-03-17 DIAGNOSIS — I25119 Atherosclerotic heart disease of native coronary artery with unspecified angina pectoris: Secondary | ICD-10-CM | POA: Diagnosis not present

## 2014-03-17 DIAGNOSIS — G4730 Sleep apnea, unspecified: Secondary | ICD-10-CM | POA: Diagnosis present

## 2014-03-17 DIAGNOSIS — Z955 Presence of coronary angioplasty implant and graft: Secondary | ICD-10-CM | POA: Diagnosis not present

## 2014-03-17 DIAGNOSIS — J449 Chronic obstructive pulmonary disease, unspecified: Secondary | ICD-10-CM | POA: Diagnosis present

## 2014-03-17 DIAGNOSIS — Z77098 Contact with and (suspected) exposure to other hazardous, chiefly nonmedicinal, chemicals: Secondary | ICD-10-CM | POA: Diagnosis present

## 2014-03-17 DIAGNOSIS — Z79899 Other long term (current) drug therapy: Secondary | ICD-10-CM | POA: Diagnosis not present

## 2014-03-17 DIAGNOSIS — I200 Unstable angina: Secondary | ICD-10-CM | POA: Diagnosis present

## 2014-03-17 DIAGNOSIS — R079 Chest pain, unspecified: Secondary | ICD-10-CM

## 2014-03-17 DIAGNOSIS — E785 Hyperlipidemia, unspecified: Secondary | ICD-10-CM | POA: Diagnosis present

## 2014-03-17 DIAGNOSIS — E780 Pure hypercholesterolemia: Secondary | ICD-10-CM | POA: Diagnosis not present

## 2014-03-17 DIAGNOSIS — Z743 Need for continuous supervision: Secondary | ICD-10-CM | POA: Diagnosis not present

## 2014-03-17 DIAGNOSIS — G459 Transient cerebral ischemic attack, unspecified: Secondary | ICD-10-CM | POA: Diagnosis not present

## 2014-03-17 DIAGNOSIS — I719 Aortic aneurysm of unspecified site, without rupture: Secondary | ICD-10-CM

## 2014-03-17 DIAGNOSIS — F4310 Post-traumatic stress disorder, unspecified: Secondary | ICD-10-CM | POA: Diagnosis present

## 2014-03-17 DIAGNOSIS — I10 Essential (primary) hypertension: Secondary | ICD-10-CM | POA: Diagnosis present

## 2014-03-17 DIAGNOSIS — Z7982 Long term (current) use of aspirin: Secondary | ICD-10-CM | POA: Diagnosis not present

## 2014-03-17 DIAGNOSIS — I498 Other specified cardiac arrhythmias: Secondary | ICD-10-CM | POA: Diagnosis not present

## 2014-03-17 DIAGNOSIS — Z0389 Encounter for observation for other suspected diseases and conditions ruled out: Secondary | ICD-10-CM | POA: Diagnosis not present

## 2014-03-17 DIAGNOSIS — I209 Angina pectoris, unspecified: Secondary | ICD-10-CM

## 2014-03-17 DIAGNOSIS — I25110 Atherosclerotic heart disease of native coronary artery with unstable angina pectoris: Principal | ICD-10-CM | POA: Diagnosis present

## 2014-03-17 DIAGNOSIS — Z87891 Personal history of nicotine dependence: Secondary | ICD-10-CM

## 2014-03-17 DIAGNOSIS — Z8249 Family history of ischemic heart disease and other diseases of the circulatory system: Secondary | ICD-10-CM

## 2014-03-17 DIAGNOSIS — E119 Type 2 diabetes mellitus without complications: Secondary | ICD-10-CM | POA: Diagnosis present

## 2014-03-17 DIAGNOSIS — I2510 Atherosclerotic heart disease of native coronary artery without angina pectoris: Secondary | ICD-10-CM | POA: Diagnosis present

## 2014-03-17 DIAGNOSIS — J9811 Atelectasis: Secondary | ICD-10-CM | POA: Diagnosis not present

## 2014-03-17 LAB — GLUCOSE, CAPILLARY: Glucose-Capillary: 111 mg/dL — ABNORMAL HIGH (ref 70–99)

## 2014-03-17 MED ORDER — ASPIRIN EC 81 MG PO TBEC
81.0000 mg | DELAYED_RELEASE_TABLET | Freq: Every day | ORAL | Status: DC
  Administered 2014-03-18 – 2014-03-19 (×2): 81 mg via ORAL
  Filled 2014-03-17 (×2): qty 1

## 2014-03-17 MED ORDER — EZETIMIBE 10 MG PO TABS
10.0000 mg | ORAL_TABLET | Freq: Every day | ORAL | Status: DC
  Administered 2014-03-18 – 2014-03-19 (×2): 10 mg via ORAL
  Filled 2014-03-17 (×2): qty 1

## 2014-03-17 MED ORDER — ALBUTEROL SULFATE (2.5 MG/3ML) 0.083% IN NEBU: 2.5000 mg | INHALATION_SOLUTION | Freq: Four times a day (QID) | RESPIRATORY_TRACT | Status: DC | PRN

## 2014-03-17 MED ORDER — ONDANSETRON HCL 4 MG/2ML IJ SOLN: 4.0000 mg | Freq: Four times a day (QID) | INTRAMUSCULAR | Status: DC | PRN

## 2014-03-17 MED ORDER — ACETAMINOPHEN 325 MG PO TABS: 650.0000 mg | ORAL_TABLET | ORAL | Status: DC | PRN

## 2014-03-17 MED ORDER — AMLODIPINE BESYLATE 5 MG PO TABS
5.0000 mg | ORAL_TABLET | Freq: Every day | ORAL | Status: DC
  Administered 2014-03-18 – 2014-03-19 (×2): 5 mg via ORAL
  Filled 2014-03-17 (×2): qty 1

## 2014-03-17 MED ORDER — CARVEDILOL 12.5 MG PO TABS
12.5000 mg | ORAL_TABLET | Freq: Two times a day (BID) | ORAL | Status: DC
  Filled 2014-03-17: qty 1

## 2014-03-17 MED ORDER — BENAZEPRIL HCL 20 MG PO TABS
20.0000 mg | ORAL_TABLET | Freq: Every day | ORAL | Status: DC
  Administered 2014-03-18 – 2014-03-19 (×2): 20 mg via ORAL
  Filled 2014-03-17 (×3): qty 1

## 2014-03-17 MED ORDER — NITROGLYCERIN 0.4 MG SL SUBL: 0.4000 mg | SUBLINGUAL_TABLET | SUBLINGUAL | Status: DC | PRN

## 2014-03-17 MED ORDER — TIOTROPIUM BROMIDE MONOHYDRATE 18 MCG IN CAPS
18.0000 ug | ORAL_CAPSULE | Freq: Every day | RESPIRATORY_TRACT | Status: DC
  Administered 2014-03-18: 18 ug via RESPIRATORY_TRACT
  Filled 2014-03-17: qty 5

## 2014-03-17 MED ORDER — FENOFIBRATE 160 MG PO TABS
160.0000 mg | ORAL_TABLET | Freq: Every day | ORAL | Status: DC
  Administered 2014-03-18 – 2014-03-19 (×2): 160 mg via ORAL
  Filled 2014-03-17 (×2): qty 1

## 2014-03-17 NOTE — H&P (Signed)
HPI: [name] is a 68M with CAD s/p balloon angioplasty without stent placement, HTN, HL, COPD, DM and ascending aortic aneurysm here with angina.  Patient reports an episode of mid-sternal chest pressure while golfing this AM.  He has noted episodes of exertional and non-exertional CP over the last 1.5 weeks, though today was the longest episode.  He also noted neck and L jaw pain.  The episode today was 7-8/10 chest pressure and was not associated with SOB, nausea or diaphoresis.  It was not alleviated with rest, so he decided to present to the ED for evaluation.  Eduardo [name] reports several balloon angioplasties, but no PCIs.  He last had angioplasty of an unknown vessel at [facility] at sometime in 2014.  He also reports that he was diagnosed with an ascending aortic aneurysm 2 weeks ago at the [facility].  He is unsure of how large it is.  He was told to keep his BP down and that it did not require intervention at this time.  He monitors his BP at home and it has been running in the 150s-170s.  He is unsure of what medications he is taking other than metoprolol, metformin and ASA.  He is intolerant of many different statins.   Review of Systems:     Cardiac Review of Systems: {Y] = yes [ ]  = no  Chest Pain [    ]  Resting SOB [   ] Exertional SOB  [  ]  Orthopnea [  ]   Pedal Edema [   ]    Palpitations [  ] Syncope  [  ]   Presyncope [   ]  General Review of Systems: [Y] = yes [  ]=no Constitional: recent weight change [  ]; anorexia [  ]; fatigue [  ]; nausea [  ]; night sweats [  ]; fever [  ]; or chills [  ];                                                                                                                                          Dental: poor dentition[  ];  Eye : blurred vision [  ]; diplopia [   ]; vision changes [  ];  Amaurosis fugax[  ]; Resp: cough [  ];  wheezing[  ];  hemoptysis[  ]; shortness of breath[  ]; paroxysmal nocturnal dyspnea[  ]; dyspnea  on exertion[  ]; or orthopnea[  ];  GI:  gallstones[  ], vomiting[  ];  dysphagia[  ]; melena[  ];  hematochezia [  ]; heartburn[  ];   Hx of  Colonoscopy[  ]; GU: kidney stones [  ]; hematuria[  ];   dysuria [  ];  nocturia[  ];  history of     obstruction [  ];  Skin: rash, swelling[  ];, hair loss[  ];  peripheral edema[  ];  or itching[  ]; Musculosketetal: myalgias[ Y ];  joint swelling[  ];  joint erythema[  ];  joint pain[  ];  back pain[  ];  Heme/Lymph: bruising[  ];  bleeding[  ];  anemia[  ];  Neuro: TIA[  ];  headaches[  ];  stroke[  ];  vertigo[  ];  seizures[  ];   paresthesias[ Y ];  difficulty walking[  ];  Psych:depression[  ]; anxiety[  ]; PTSD [Y]  Endocrine: diabetesY  ];  thyroid dysfunction[  ];  Immunizations: Flu [  ]; Pneumococcal[  ];  Other:  Past Medical History  Diagnosis Date  . COPD (chronic obstructive pulmonary disease)   . CAD (coronary artery disease)   . Diabetes mellitus without complication   . Hypertension   . Neuromuscular disorder   . Sleep apnea   . Chemical exposure     agent orange   . PTSD (post-traumatic stress disorder)   . Diverticulitis     Medications Prior to Admission  Medication Sig Dispense Refill  . albuterol (PROVENTIL HFA;VENTOLIN HFA) 108 (90 BASE) MCG/ACT inhaler Inhale 1-2 puffs into the lungs every 6 (six) hours as needed for wheezing or shortness of breath.      [name] amLODipine-benazepril (LOTREL) 5-20 MG per capsule Take 1 capsule by mouth daily.  90 capsule  3  . aspirin EC 81 MG tablet Take 81 mg by mouth daily.      . Cholecalciferol (VITAMIN D) 2000 UNITS tablet Take 2,000 Units by mouth daily.      [name] docusate sodium (COLACE) 100 MG capsule Take 1 capsule (100 mg total) by mouth 2 (two) times daily. Continue this while taking narcotics to help with bowel movements  30 capsule  1  . doxycycline (VIBRA-TABS) 100 MG tablet Take 1 tablet (100 mg total) by mouth 2 (two) times daily.  20 tablet  0  . ezetimibe  (ZETIA) 10 MG tablet Take 10 mg by mouth daily.      . fenofibrate micronized (LOFIBRA) 134 MG capsule Take 1 capsule (134 mg total) by mouth daily before breakfast.  90 capsule  3  . meloxicam (MOBIC) 15 MG tablet Take 1 tablet (15 mg total) by mouth daily.  90 tablet  3  . metFORMIN (GLUCOPHAGE) 1000 MG tablet Take 1,000 mg by mouth 2 (two) times daily with a meal.      . metoprolol (LOPRESSOR) 50 MG tablet Take 1 tablet (50 mg total) by mouth 2 (two) times daily.  180 tablet  3  . prednisoLONE acetate (PRED FORTE) 1 % ophthalmic suspension 1 drop 4 (four) times daily.      . sertraline (ZOLOFT) 100 MG tablet Take 100 mg by mouth daily.      [name] tiotropium (SPIRIVA) 18 MCG inhalation capsule Place 18 mcg into inhaler and inhale daily.         Allergies  Allergen Reactions  . Statins     Body aches    History   Social History  . Marital Status: Married    Spouse Name: N/A    Number of Children: N/A  . Years of Education: N/A   Occupational History  . Not on file.   Social History Main Topics  . Smoking status: Former Smoker -- 0.75 packs/day for 30 years    Types: Cigarettes    Quit date: 05/31/1987  . Smokeless tobacco: Never Used  .  Alcohol Use: No  . Drug Use: No  . Sexual Activity: Not on file   Other Topics Concern  . Not on file   Social History Narrative  . No narrative on file   FH: Father and brothers all with CAD.  Youngest had CAD at age 38.   PHYSICAL EXAM: Filed Vitals:   03/17/14 2145  BP: 129/78  Pulse: 52  Temp: 97.9 F (36.6 C)  Resp: 18   General:  Well appearing. No respiratory difficulty HEENT: normal Neck: supple. no JVD. Carotids 2+ bilat; no bruits. No lymphadenopathy or thryomegaly appreciated. Cor: PMI nondisplaced. Regular rate & rhythm. No rubs, gallops or murmurs.  Distant heart sounds Lungs: clear to auscultation bilaterally Abdomen: soft, nontender, nondistended. No hepatosplenomegaly. No bruits or masses. Good bowel  sounds. Extremities: no cyanosis, clubbing, rash, edema.  2+ radial, DP, PT pulses bilaterally Neuro: alert & oriented x 3, cranial nerves grossly intact. moves all 4 extremities w/o difficulty. Affect pleasant.  ECG: Sinus bradycardia at 54 bpm.  Late R wave progression.  Low voltage in the precordial leads.  Results for orders placed during the hospital encounter of 03/17/14 (from the past 24 hour(s))  GLUCOSE, CAPILLARY     Status: Abnormal   Collection Time    03/17/14  9:52 PM      Result Value Ref Range   Glucose-Capillary 111 (*) 70 - 99 mg/dL   Comment 1 Notify RN     No results found.   ASSESSMENT: 25M with CAD s/p balloon angioplasty without stent placement, HTN, HL, COPD, DM and ascending aortic aneurysm here with angina.  Patient's symptoms are somewhat atypical, but he is high risk given his known coronary disease and family history.  He is currently CP free on a heparin gtt.   PLAN/DISCUSSION: # CP/CAD: Likely due to unstable angina. - NPO for likely LHC tomorrow.  Stress is also reasonable given that his symptoms are somewhat atypical. - Switch metoprolol to carvedilol given poor BP control and bradycardia - ASA 81mg  daily - zetia and fenofibrate given h/o statin intolerance - continue home ACE-I - Will need to verify home meds in theAM.  Patient is unsure of what he is taking.  # Ascending aortic aneurysm: Unknown size.   - BP control as above - Consider getting [facility] records  #  COPD: continue home albuterol and tiotropium  # DM: Hold home metformin. - AC/HS accuchecks + SSI  # Code: Full

## 2014-03-17 NOTE — Telephone Encounter (Signed)
Patient walked into office c/o chest pain/pressure active approximately 45 minutes ago.  Does c/o SOB, no dizziness.  States he just does not feel well.  Also, c/o neck & jaw pain.  States BP this morning was 171/103.  Advised patient to go to ED for evaluation.  Questioned if patient was alone & driving.  Offered to call someone/EMS to take him to hospital & patient declined & stated he could make it.

## 2014-03-18 ENCOUNTER — Encounter (HOSPITAL_COMMUNITY): Payer: Self-pay | Admitting: General Practice

## 2014-03-18 ENCOUNTER — Inpatient Hospital Stay (HOSPITAL_COMMUNITY): Payer: Medicare Other

## 2014-03-18 ENCOUNTER — Encounter (HOSPITAL_COMMUNITY)
Admission: AD | Disposition: A | Discharge status: Home / Self Care | Payer: Self-pay | Source: Other Acute Inpatient Hospital | Attending: Cardiology

## 2014-03-18 DIAGNOSIS — R001 Bradycardia, unspecified: Secondary | ICD-10-CM

## 2014-03-18 DIAGNOSIS — I25110 Atherosclerotic heart disease of native coronary artery with unstable angina pectoris: Secondary | ICD-10-CM

## 2014-03-18 DIAGNOSIS — I200 Unstable angina: Secondary | ICD-10-CM | POA: Diagnosis present

## 2014-03-18 DIAGNOSIS — I10 Essential (primary) hypertension: Secondary | ICD-10-CM

## 2014-03-18 DIAGNOSIS — E785 Hyperlipidemia, unspecified: Secondary | ICD-10-CM

## 2014-03-18 HISTORY — PX: LEFT HEART CATHETERIZATION WITH CORONARY ANGIOGRAM: SHX5451

## 2014-03-18 HISTORY — PX: CARDIAC CATHETERIZATION: SHX172

## 2014-03-18 LAB — COMPREHENSIVE METABOLIC PANEL
ALK PHOS: 40 U/L (ref 39–117)
ALT: 21 U/L (ref 0–53)
ANION GAP: 14 (ref 5–15)
AST: 23 U/L (ref 0–37)
Albumin: 3.7 g/dL (ref 3.5–5.2)
BUN: 16 mg/dL (ref 6–23)
CO2: 23 mEq/L (ref 19–32)
Calcium: 9 mg/dL (ref 8.4–10.5)
Chloride: 99 mEq/L (ref 96–112)
Creatinine, Ser: 0.7 mg/dL (ref 0.50–1.35)
GFR calc non Af Amer: >90 mL/min (ref >90)
GLUCOSE: 125 mg/dL — AB (ref 70–99)
Potassium: 3.9 mEq/L (ref 3.7–5.3)
Sodium: 136 mEq/L — ABNORMAL LOW (ref 137–147)
TOTAL PROTEIN: 6.6 g/dL (ref 6.0–8.3)
Total Bilirubin: 0.5 mg/dL (ref 0.3–1.2)

## 2014-03-18 LAB — CBC
HCT: 44.3 % (ref 39.0–52.0)
HEMATOCRIT: 44.4 % (ref 39.0–52.0)
HEMOGLOBIN: 15.4 g/dL (ref 13.0–17.0)
Hemoglobin: 15.4 g/dL (ref 13.0–17.0)
MCH: 31.6 pg (ref 26.0–34.0)
MCH: 31.6 pg (ref 26.0–34.0)
MCHC: 34.7 g/dL (ref 30.0–36.0)
MCHC: 34.8 g/dL (ref 30.0–36.0)
MCV: 91 fL (ref 78.0–100.0)
MCV: 91 fL (ref 78.0–100.0)
Platelets: 164 10*3/uL (ref 150–400)
Platelets: 166 10*3/uL (ref 150–400)
RBC: 4.88 MIL/uL (ref 4.22–5.81)
RBC: 4.87 MIL/uL (ref 4.22–5.81)
RDW: 12.7 % (ref 11.5–15.5)
RDW: 12.6 % (ref 11.5–15.5)
WBC: 5.5 10*3/uL (ref 4.0–10.5)
WBC: 4.9 10*3/uL (ref 4.0–10.5)

## 2014-03-18 LAB — HEPARIN LEVEL (UNFRACTIONATED): Heparin Unfractionated: 0.14 IU/mL — ABNORMAL LOW (ref 0.30–0.70)

## 2014-03-18 LAB — LIPID PANEL
Cholesterol: 151 mg/dL (ref 0–200)
HDL: 30 mg/dL — ABNORMAL LOW (ref >39)
LDL CALC: 81 mg/dL (ref 0–99)
TRIGLYCERIDES: 200 mg/dL — AB (ref <150)
Total CHOL/HDL Ratio: 5 RATIO
VLDL: 40 mg/dL (ref 0–40)

## 2014-03-18 LAB — GLUCOSE, CAPILLARY
GLUCOSE-CAPILLARY: 164 mg/dL — AB (ref 70–99)
Glucose-Capillary: 137 mg/dL — ABNORMAL HIGH (ref 70–99)
Glucose-Capillary: 137 mg/dL — ABNORMAL HIGH (ref 70–99)
Glucose-Capillary: 114 mg/dL — ABNORMAL HIGH (ref 70–99)

## 2014-03-18 LAB — BASIC METABOLIC PANEL
ANION GAP: 12 (ref 5–15)
BUN: 15 mg/dL (ref 6–23)
CALCIUM: 8.8 mg/dL (ref 8.4–10.5)
CHLORIDE: 102 meq/L (ref 96–112)
CO2: 26 meq/L (ref 19–32)
Creatinine, Ser: 0.66 mg/dL (ref 0.50–1.35)
GFR calc Af Amer: >90 mL/min (ref >90)
GFR calc non Af Amer: >90 mL/min (ref >90)
GLUCOSE: 143 mg/dL — AB (ref 70–99)
Potassium: 3.9 mEq/L (ref 3.7–5.3)
Sodium: 140 mEq/L (ref 137–147)

## 2014-03-18 LAB — MAGNESIUM: MAGNESIUM: 1.4 mg/dL — AB (ref 1.5–2.5)

## 2014-03-18 LAB — PROTIME-INR
INR: 1.11 (ref 0.00–1.49)
Prothrombin Time: 14.4 seconds (ref 11.6–15.2)

## 2014-03-18 LAB — TROPONIN I: Troponin I: <0.3 ng/mL (ref <0.30)

## 2014-03-18 LAB — CREATININE, SERUM
CREATININE: 0.76 mg/dL (ref 0.50–1.35)
GFR calc Af Amer: >90 mL/min (ref >90)

## 2014-03-18 LAB — APTT: APTT: 42 s — AB (ref 24–37)

## 2014-03-18 SURGERY — LEFT HEART CATHETERIZATION WITH CORONARY ANGIOGRAM
Anesthesia: LOCAL

## 2014-03-18 MED ORDER — LIDOCAINE HCL (PF) 1 % IJ SOLN
INTRAMUSCULAR | Status: AC
  Filled 2014-03-18: qty 30

## 2014-03-18 MED ORDER — SERTRALINE HCL 100 MG PO TABS
100.0000 mg | ORAL_TABLET | Freq: Every day | ORAL | Status: DC
  Administered 2014-03-18 – 2014-03-19 (×2): 100 mg via ORAL
  Filled 2014-03-18 (×2): qty 1

## 2014-03-18 MED ORDER — INSULIN ASPART 100 UNIT/ML ~~LOC~~ SOLN
0.0000 [IU] | Freq: Three times a day (TID) | SUBCUTANEOUS | Status: DC
  Administered 2014-03-19: 2 [IU] via SUBCUTANEOUS

## 2014-03-18 MED ORDER — FENTANYL CITRATE 0.05 MG/ML IJ SOLN
INTRAMUSCULAR | Status: AC
  Filled 2014-03-18: qty 2

## 2014-03-18 MED ORDER — SODIUM CHLORIDE 0.9 % IV SOLN
1.0000 mL/kg/h | INTRAVENOUS | Status: AC
  Administered 2014-03-18: 1 mL/kg/h via INTRAVENOUS

## 2014-03-18 MED ORDER — HEPARIN SODIUM (PORCINE) 5000 UNIT/ML IJ SOLN
5000.0000 [IU] | Freq: Three times a day (TID) | INTRAMUSCULAR | Status: DC
  Administered 2014-03-19: 5000 [IU] via SUBCUTANEOUS
  Filled 2014-03-18 (×2): qty 1

## 2014-03-18 MED ORDER — HEPARIN BOLUS VIA INFUSION
3000.0000 [IU] | Freq: Once | INTRAVENOUS | Status: AC
  Administered 2014-03-18: 3000 [IU] via INTRAVENOUS
  Filled 2014-03-18: qty 3000

## 2014-03-18 MED ORDER — HEPARIN SODIUM (PORCINE) 1000 UNIT/ML IJ SOLN
INTRAMUSCULAR | Status: AC
  Filled 2014-03-18: qty 1

## 2014-03-18 MED ORDER — PREDNISOLONE ACETATE 1 % OP SUSP
1.0000 [drp] | Freq: Four times a day (QID) | OPHTHALMIC | Status: DC
  Administered 2014-03-18 – 2014-03-19 (×4): 1 [drp] via OPHTHALMIC
  Filled 2014-03-18: qty 1

## 2014-03-18 MED ORDER — VERAPAMIL HCL 2.5 MG/ML IV SOLN
INTRAVENOUS | Status: AC
  Filled 2014-03-18: qty 2

## 2014-03-18 MED ORDER — HEPARIN (PORCINE) IN NACL 2-0.9 UNIT/ML-% IJ SOLN
INTRAMUSCULAR | Status: AC
  Filled 2014-03-18: qty 1500

## 2014-03-18 MED ORDER — HEPARIN BOLUS VIA INFUSION
2500.0000 [IU] | Freq: Once | INTRAVENOUS | Status: AC
  Administered 2014-03-18: 2500 [IU] via INTRAVENOUS
  Filled 2014-03-18: qty 2500

## 2014-03-18 MED ORDER — CARVEDILOL 6.25 MG PO TABS
6.2500 mg | ORAL_TABLET | Freq: Two times a day (BID) | ORAL | Status: DC
  Administered 2014-03-18 – 2014-03-19 (×3): 6.25 mg via ORAL
  Filled 2014-03-18: qty 2
  Filled 2014-03-18: qty 1

## 2014-03-18 MED ORDER — SODIUM CHLORIDE 0.9 % IV SOLN
INTRAVENOUS | Status: DC
  Administered 2014-03-18: 12:00:00 via INTRAVENOUS

## 2014-03-18 MED ORDER — MIDAZOLAM HCL 2 MG/2ML IJ SOLN
INTRAMUSCULAR | Status: AC
  Filled 2014-03-18: qty 2

## 2014-03-18 MED ORDER — HEPARIN (PORCINE) IN NACL 100-0.45 UNIT/ML-% IJ SOLN
1600.0000 [IU]/h | INTRAMUSCULAR | Status: DC
  Administered 2014-03-18: 1350 [IU]/h via INTRAVENOUS
  Filled 2014-03-18: qty 250

## 2014-03-18 MED ORDER — CARVEDILOL 6.25 MG PO TABS
6.2500 mg | ORAL_TABLET | Freq: Two times a day (BID) | ORAL | Status: DC
  Filled 2014-03-18: qty 1

## 2014-03-18 NOTE — H&P (View-Only) (Signed)
Patient: Eduardo [name] / Admit Date: 03/17/2014 / Date of Encounter: 03/18/2014, 8:19 AM   Subjective: Continues to have intermittent tightness. Similar to prior angina but not as bad. Nothing makes it worse or better. It is not necessarily exertional.   Objective: Telemetry: sinus bradycardia EKG: Sinus bradycardia 53bpm, no acute ST-T changes Physical Exam: Blood pressure 127/70, pulse 55, temperature 97.4 F (36.3 C), temperature source Oral, resp. rate 18, height 5\' 8"  (1.727 m), weight 197 lb 9.6 oz (89.631 kg), SpO2 97.00%. General: Well developed, well nourished WM in no acute distress. Laying flat in bed. Head: Normocephalic, atraumatic, sclera non-icteric, no xanthomas, nares are without discharge. Neck: Negative for carotid bruits. JVP not elevated. Lungs: Clear bilaterally to auscultation without wheezes, rales, or rhonchi. Breathing is unlabored. Heart: RRR S1 S2 without murmurs, rubs, or gallops.  Abdomen: Soft, non-tender, non-distended with normoactive bowel sounds. No rebound/guarding. Extremities: No clubbing or cyanosis. No edema. Distal pedal pulses are 2+ and equal bilaterally. Neuro: Alert and oriented X 3. Moves all extremities spontaneously. Psych:  Responds to questions appropriately with a normal affect.   Intake/Output Summary (Last 24 hours) at 03/18/14 0819 Last data filed at 03/18/14 0600  Gross per 24 hour  Intake     54 ml  Output    500 ml  Net   -446 ml    Inpatient Medications:  . amLODipine  5 mg Oral Daily  . aspirin EC  81 mg Oral Daily  . benazepril  20 mg Oral Daily  . carvedilol  12.5 mg Oral BID WC  . ezetimibe  10 mg Oral Daily  . fenofibrate  160 mg Oral Daily  . tiotropium  18 mcg Inhalation Daily   Infusions:  . heparin 1,350 Units/hr (03/18/14 0238)    Labs:  Recent Labs  03/18/14 0031 03/18/14 0546  NA 136* 140  K 3.9 3.9  CL 99 102  CO2 23 26  GLUCOSE 125* 143*  BUN 16 15  CREATININE 0.70 0.66  CALCIUM  9.0 8.8  MG 1.4*  --     Recent Labs  03/18/14 0031  AST 23  ALT 21  ALKPHOS 40  BILITOT 0.5  PROT 6.6  ALBUMIN 3.7    Recent Labs  03/18/14 0546  WBC 5.5  HGB 15.4  HCT 44.4  MCV 91.0  PLT 164    Recent Labs  03/18/14 0031 03/18/14 0546  TROPONINI <0.30 <0.30   Radiology/Studies:  See shadow chart   Assessment and Plan  1. Chest pain concerning for unstable angina  - history of CAD s/p prior balloon angioplasties, last reportedly in 2014 - troponin negative, EKG nonacute  - I suspect given ongoing intermittent pain, cardiac cath would give [place] the most definitive evaluation - will d/w MD  2. HTN in context of sinus bradycardia - recently poorly controlled at home but normotensive here  - metoprolol was changed to carvedilol on admission for improved BP control/low HR but HR remains mid 40's in sinus bradycardia - AM carvedilol dose held due to hold parameters - resume at 6.25mg  BID this evening - if need be, would increase amlodipine to 10mg  daily - will ask pharmacy to please reconcile meds as this has not yet been completed  3. ? Aortic root dilitation - recently diagnosed at [facility] by echo - was not told it was an aneurysm but told "the main artery just after the valve was enlarged and I would have to keep my blood pressure under control"  4. COPD  - follow on beta blocker therapy - not actively wheezing  5. Diabetes mellitus - Metformin on hold - add SSI  6. Hyperlipidemia  - intolerant of many statins - on Zetia/fenofibrate - LDL 81  7. Abnormal CXR - [place]head film: left base subsegmental atx, no infiltrate. Question nodular density vs overlapping structures peripheral aspect left upper lung - recheck 2V CXR  Signed, Dayna Dunn PA-C   History and all data above reviewed.  Patient examined.  I agree with the findings as above.  Still with chest pain with history of CAD.  No objective evidence of ischemia.  The patient exam reveals COR:RRR, no rub   ,  Lungs: Clear  ,  Abd: Positive bowel sounds, no rebound no guarding, Ext No edema   .  All available labs, radiology testing, previous records reviewed. Agree with documented assessment and plan. Chest pain consistent with unstable angina.  Cardiac cath is indicated.  The patient understands that risks included but are not limited to stroke (1 in 1000), death (1 in 55), kidney failure [usually temporary] (1 in 500), bleeding (1 in 200), allergic reaction [possibly serious] (1 in 200).  The patient understands and agrees to proceed.     Jeneen Rinks Nikolaus Pienta  10:46 AM  03/18/2014

## 2014-03-18 NOTE — Care Management Note (Unsigned)
    Page 1 of 1   03/18/2014     2:14:32 PM CARE MANAGEMENT NOTE 03/18/2014  Patient:  Eduardo [name], Eduardo [name]   Account Number:  [id number]  Date Initiated:  03/18/2014  Documentation initiated by:  GRAVES-BIGELOW,Jarome Trull  Subjective/Objective Assessment:   Pt admitted for cp.     Action/Plan:   CM will continue to monitor for disposition needs.   Anticipated DC Date:  03/19/2014   Anticipated DC Plan:  [place]  CM consult      Choice offered to / List presented to:             Status of service:  In process, will continue to follow Medicare Important Message given?   (If response is "NO", the following Medicare IM given date fields will be blank) Date Medicare IM given:   Medicare IM given by:   Date Additional Medicare IM given:   Additional Medicare IM given by:    Discharge Disposition:    Per UR Regulation:  Reviewed for med. necessity/level of care/duration of stay  If discussed at [place] of Stay Meetings, dates discussed:    Comments:

## 2014-03-18 NOTE — Interval H&P Note (Signed)
History and Physical Interval Note:  03/18/2014 3:40 PM  Eduardo [name]  has presented today for surgery, with the diagnosis of cp  The various methods of treatment have been discussed with the patient and family. After consideration of risks, benefits and other options for treatment, the patient has consented to  Procedure(s): LEFT HEART CATHETERIZATION WITH CORONARY ANGIOGRAM (N/A) as a surgical intervention .  The patient's history has been reviewed, patient examined, no change in status, stable for surgery.  I have reviewed the patient's chart and labs.  Questions were answered to the patient's satisfaction.   Cath Lab Visit (complete for each Cath Lab visit)  Clinical Evaluation Leading to the Procedure:   ACS: Yes.    Non-ACS:    Anginal Classification: CCS III  Anti-ischemic medical therapy: Maximal Therapy (2 or more classes of medications)  Non-Invasive Test Results: No non-invasive testing performed  Prior CABG: No previous CABG        Eduardo [name] [facility] 03/18/2014 3:40 PM

## 2014-03-18 NOTE — Progress Notes (Signed)
ANTICOAGULATION CONSULT NOTE - Follow Up Consult  Pharmacy Consult for heparin Indication: chest pain/ACS  Allergies  Allergen Reactions  . Statins Other (See Comments)    Body aches    Patient Measurements: Height: 5\' 8"  (172.7 cm) Weight: 197 lb 9.6 oz (89.631 kg) IBW/kg (Calculated) : 68.4 Heparin Dosing Weight: 85 kg  Vital Signs: Temp: 97.4 F (36.3 C) (10/20 0551) Temp Source: Oral (10/20 0551) BP: 130/70 mmHg (10/20 0926) Pulse Rate: 51 (10/20 0926)  Labs:  Recent Labs  03/18/14 0031 03/18/14 0036 03/18/14 0546 03/18/14 1200  HGB  --   --  15.4  --   HCT  --   --  44.4  --   PLT  --   --  164  --   APTT 42*  --   --   --   LABPROT 14.4  --   --   --   INR 1.11  --   --   --   HEPARINUNFRC  --  <0.10*  --  0.14*  CREATININE 0.70  --  0.66  --   TROPONINI <0.30  --  <0.30  --     Estimated Creatinine Clearance: 94.8 ml/min (by C-G formula based on Cr of 0.66).   Medications:  Infusions:  . sodium chloride 100 mL/hr at 03/18/14 1229  . heparin 1,350 Units/hr (03/18/14 0238)    Assessment: 68 y/o male transferred from [facility] on IV heparin for CP. Heparin level is subtherapeutic at 0.14 on 1350 units/hr. Plan is for cath today. No bleeding noted, CBC is normal. No problems with heparin infusing per RN.  Goal of Therapy:  Heparin level 0.3-0.7 units/ml Monitor platelets by anticoagulation protocol: Yes   Plan:  - Heparin 2500 units IV bolus then increase heparin drip to 1600 units/hr - 6 hr heparin level or f/u after cath - Daily heparin level and CBC  [facility], [place].D., BCPS Clinical Pharmacist Pager: [phone] 03/18/2014 1:07 PM

## 2014-03-18 NOTE — Progress Notes (Signed)
TR band removed and pressure dsg applied; RT radial level 0; no immediate complications noted. Advised pt to leave dsg in place for 24hrs and to continue to restrict RT arm movement.  Pt in agreement.  Night RN at bedside.

## 2014-03-18 NOTE — Progress Notes (Signed)
Patient: Eduardo [name] / Admit Date: 03/17/2014 / Date of Encounter: 03/18/2014, 8:19 AM   Subjective: Continues to have intermittent tightness. Similar to prior angina but not as bad. Nothing makes it worse or better. It is not necessarily exertional.   Objective: Telemetry: sinus bradycardia EKG: Sinus bradycardia 53bpm, no acute ST-T changes Physical Exam: Blood pressure 127/70, pulse 55, temperature 97.4 F (36.3 C), temperature source Oral, resp. rate 18, height 5\' 8"  (1.727 m), weight 197 lb 9.6 oz (89.631 kg), SpO2 97.00%. General: Well developed, well nourished WM in no acute distress. Laying flat in bed. Head: Normocephalic, atraumatic, sclera non-icteric, no xanthomas, nares are without discharge. Neck: Negative for carotid bruits. JVP not elevated. Lungs: Clear bilaterally to auscultation without wheezes, rales, or rhonchi. Breathing is unlabored. Heart: RRR S1 S2 without murmurs, rubs, or gallops.  Abdomen: Soft, non-tender, non-distended with normoactive bowel sounds. No rebound/guarding. Extremities: No clubbing or cyanosis. No edema. Distal pedal pulses are 2+ and equal bilaterally. Neuro: Alert and oriented X 3. Moves all extremities spontaneously. Psych:  Responds to questions appropriately with a normal affect.   Intake/Output Summary (Last 24 hours) at 03/18/14 0819 Last data filed at 03/18/14 0600  Gross per 24 hour  Intake     54 ml  Output    500 ml  Net   -446 ml    Inpatient Medications:  . amLODipine  5 mg Oral Daily  . aspirin EC  81 mg Oral Daily  . benazepril  20 mg Oral Daily  . carvedilol  12.5 mg Oral BID WC  . ezetimibe  10 mg Oral Daily  . fenofibrate  160 mg Oral Daily  . tiotropium  18 mcg Inhalation Daily   Infusions:  . heparin 1,350 Units/hr (03/18/14 0238)    Labs:  Recent Labs  03/18/14 0031 03/18/14 0546  NA 136* 140  K 3.9 3.9  CL 99 102  CO2 23 26  GLUCOSE 125* 143*  BUN 16 15  CREATININE 0.70 0.66  CALCIUM  9.0 8.8  MG 1.4*  --     Recent Labs  03/18/14 0031  AST 23  ALT 21  ALKPHOS 40  BILITOT 0.5  PROT 6.6  ALBUMIN 3.7    Recent Labs  03/18/14 0546  WBC 5.5  HGB 15.4  HCT 44.4  MCV 91.0  PLT 164    Recent Labs  03/18/14 0031 03/18/14 0546  TROPONINI <0.30 <0.30   Radiology/Studies:  See shadow chart   Assessment and Plan  1. Chest pain concerning for unstable angina  - history of CAD s/p prior balloon angioplasties, last reportedly in 2014 - troponin negative, EKG nonacute  - I suspect given ongoing intermittent pain, cardiac cath would give [place] the most definitive evaluation - will d/w MD  2. HTN in context of sinus bradycardia - recently poorly controlled at home but normotensive here  - metoprolol was changed to carvedilol on admission for improved BP control/low HR but HR remains mid 40's in sinus bradycardia - AM carvedilol dose held due to hold parameters - resume at 6.25mg  BID this evening - if need be, would increase amlodipine to 10mg  daily - will ask pharmacy to please reconcile meds as this has not yet been completed  3. ? Aortic root dilitation - recently diagnosed at [facility] by echo - was not told it was an aneurysm but told "the main artery just after the valve was enlarged and I would have to keep my blood pressure under control"  4. COPD  - follow on beta blocker therapy - not actively wheezing  5. Diabetes mellitus - Metformin on hold - add SSI  6. Hyperlipidemia  - intolerant of many statins - on Zetia/fenofibrate - LDL 81  7. Abnormal CXR - [place] film: left base subsegmental atx, no infiltrate. Question nodular density vs overlapping structures peripheral aspect left upper lung - recheck 2V CXR  Signed, Dayna Dunn PA-C   History and all data above reviewed.  Patient examined.  I agree with the findings as above.  Still with chest pain with history of CAD.  No objective evidence of ischemia.  The patient exam reveals COR:RRR, no rub   ,  Lungs: Clear  ,  Abd: Positive bowel sounds, no rebound no guarding, Ext No edema   .  All available labs, radiology testing, previous records reviewed. Agree with documented assessment and plan. Chest pain consistent with unstable angina.  Cardiac cath is indicated.  The patient understands that risks included but are not limited to stroke (1 in 1000), death (1 in 36), kidney failure [usually temporary] (1 in 500), bleeding (1 in 200), allergic reaction [possibly serious] (1 in 200).  The patient understands and agrees to proceed.     Jeneen Rinks Melchor Kirchgessner  10:46 AM  03/18/2014

## 2014-03-18 NOTE — Progress Notes (Signed)
ANTICOAGULATION CONSULT NOTE - Initial Consult  Pharmacy Consult for Heparin Indication: chest pain/ACS  Allergies  Allergen Reactions  . Statins     Body aches    Patient Measurements: Height: 5\' 8"  (172.7 cm) Weight: 197 lb 3.2 oz (89.449 kg) IBW/kg (Calculated) : 68.4 Heparin Dosing Weight: 85 kg  Vital Signs: Temp: 97.9 F (36.6 C) (10/19 2145) Temp Source: Oral (10/19 2145) BP: 129/78 mmHg (10/19 2145) Pulse Rate: 52 (10/19 2145)  Labs (at [facility]): WBC  6.5 Hgb  15.1 Hct  44.5 Plt  190  SCr  0.69 No results found for this basename: HGB, HCT, PLT, APTT, LABPROT, INR, HEPARINUNFRC, CREATININE, CKTOTAL, CKMB, TROPONINI,  in the last 72 hours  Estimated Creatinine Clearance: 94.7 ml/min (by C-G formula based on Cr of 0.76).   Medical History: Past Medical History  Diagnosis Date  . COPD (chronic obstructive pulmonary disease)   . CAD (coronary artery disease)   . Diabetes mellitus without complication   . Hypertension   . Neuromuscular disorder   . Sleep apnea   . Chemical exposure     agent orange   . PTSD (post-traumatic stress disorder)   . Diverticulitis     Medications:  Metformin  Zoloft  Lotrel  Fenofibrate  Lopressor  ASA  Spiriva  Zetia  Mobic    Assessment: 68 y.o. male with chest pain for heparin.  Heparin 4000 units IV bolus, 1550 units/hr started at [facility] at 1 pm.    Goal of Therapy:  Heparin level 0.3-0.7 units/ml Monitor platelets by anticoagulation protocol: Yes   Plan:  Heparin 3000 units IV bolus, then restart heparin 1350 units/hr Check heparin level in 6 hours.   Aaniya Sterba, Bronson Curb 03/18/2014,12:13 AM

## 2014-03-18 NOTE — CV Procedure (Signed)
    Cardiac Catheterization Procedure Note  Name: Eduardo [name] MRN: [id number] DOB: 1945-07-02  Procedure: Left Heart Cath, Selective Coronary Angiography, LV angiography  Indication: 68 yo WM with history of CAD s/p multiple PTCAs in the past presents with chest pain.    Procedural Details: The right wrist was prepped, draped, and anesthetized with 1% lidocaine. Using the modified Seldinger technique, a 6 French slender sheath was introduced into the right radial artery. 3 mg of verapamil was administered through the sheath, weight-based unfractionated heparin was administered intravenously. Standard Judkins catheters were used for selective coronary angiography and left ventriculography. Catheter exchanges were performed over an exchange length guidewire. There were no immediate procedural complications. A TR band was used for radial hemostasis at the completion of the procedure.  The patient was transferred to the post catheterization recovery area for further monitoring.  Procedural Findings: Hemodynamics: AO 118/59 mean 85 mm Hg LV 115/12 mm Hg  Coronary angiography: Coronary dominance: right  Left mainstem: The left main is long and calcified with mild 20% mid vessel stenosis.   Left anterior descending (LAD): The LAD is moderately calcified. There is a 50% stenosis in the mid vessel between the first and second diagonals. The two diagonals arise in close proximity and are normal.  Left circumflex (LCx): There is a 40% ostial LCx lesion. The LCx bifurcates into 2 OM branches. The first OM has mild disease up to 20%. The second OM has a 60-70% stenosis at the ostium.   Right coronary artery (RCA): The RCA has a marked shepherd's crook deformity in the proximal vessel. At the acute bend there is an eccentric heavily calcified plaque with moderate stenosis estimated at 70-80%. The mid RCA is heavily calcified with eccentric 60% stenosis. The distal RCA is also calcified with  diffuse 30% disease.  Left ventriculography: Left ventricular systolic function is normal, LVEF is estimated at 55-65%, there is no significant mitral regurgitation   Final Conclusions:   1. ASCAD. There is complex calcified disease in the RCA. There is a moderate stenosis at the origin of OM2 and borderline disease in the mid LAD.  2. Normal LV function.  Recommendations: It is difficult to tell what the culprit is for his chest pain which is somewhat atypical. It sounds like he has had PTCA of the OM2 in the past (probably did not want to jail OM1 with a stent). His RCA disease appears more severe but is difficult to assess accurately on angiography due to heavy calcificationm, tortuosity,  and eccentric plaque. PCI of the RCA would be complex due to marked tortuosity and calcification. I think it would be very helpful to have a functional assessment of his ischemic burden and location to help guide the need for further intervention. Will arrange a stress Myoview in the am. If he has no or mild ischemia I would treat him medically. If he has significant ischemia then location may help guide therapy.   Eduardo [name] [place], [name]  03/18/2014, 4:32 PM

## 2014-03-19 ENCOUNTER — Inpatient Hospital Stay (HOSPITAL_COMMUNITY): Payer: Medicare Other

## 2014-03-19 ENCOUNTER — Encounter (HOSPITAL_COMMUNITY): Payer: Self-pay | Admitting: Cardiology

## 2014-03-19 DIAGNOSIS — R079 Chest pain, unspecified: Secondary | ICD-10-CM

## 2014-03-19 DIAGNOSIS — I25119 Atherosclerotic heart disease of native coronary artery with unspecified angina pectoris: Secondary | ICD-10-CM

## 2014-03-19 DIAGNOSIS — G459 Transient cerebral ischemic attack, unspecified: Secondary | ICD-10-CM

## 2014-03-19 LAB — CBC
HCT: 46.3 % (ref 39.0–52.0)
HEMOGLOBIN: 15.9 g/dL (ref 13.0–17.0)
MCH: 32.3 pg (ref 26.0–34.0)
MCHC: 34.3 g/dL (ref 30.0–36.0)
MCV: 93.9 fL (ref 78.0–100.0)
Platelets: 192 10*3/uL (ref 150–400)
RBC: 4.93 MIL/uL (ref 4.22–5.81)
RDW: 12.7 % (ref 11.5–15.5)
WBC: 5.8 10*3/uL (ref 4.0–10.5)

## 2014-03-19 LAB — GLUCOSE, CAPILLARY: GLUCOSE-CAPILLARY: 163 mg/dL — AB (ref 70–99)

## 2014-03-19 MED ORDER — REGADENOSON 0.4 MG/5ML IV SOLN
INTRAVENOUS | Status: AC
  Administered 2014-03-19: 0.4 mg via INTRAVENOUS
  Filled 2014-03-19: qty 5

## 2014-03-19 MED ORDER — TECHNETIUM TC 99M SESTAMIBI GENERIC - CARDIOLITE
10.0000 | Freq: Once | INTRAVENOUS | Status: AC | PRN
  Administered 2014-03-19: 10 via INTRAVENOUS

## 2014-03-19 MED ORDER — TECHNETIUM TC 99M SESTAMIBI GENERIC - CARDIOLITE
30.0000 | Freq: Once | INTRAVENOUS | Status: AC | PRN
  Administered 2014-03-19: 30 via INTRAVENOUS

## 2014-03-19 MED ORDER — METFORMIN HCL 500 MG PO TABS: 500.0000 mg | ORAL_TABLET | Freq: Two times a day (BID) | ORAL | Status: DC

## 2014-03-19 MED ORDER — REGADENOSON 0.4 MG/5ML IV SOLN
0.4000 mg | Freq: Once | INTRAVENOUS | Status: AC
  Administered 2014-03-19: 0.4 mg via INTRAVENOUS
  Filled 2014-03-19: qty 5

## 2014-03-19 MED ORDER — CARVEDILOL 6.25 MG PO TABS: 6.2500 mg | ORAL_TABLET | Freq: Two times a day (BID) | ORAL | Status: DC

## 2014-03-19 MED ORDER — NITROGLYCERIN 0.4 MG SL SUBL: 0.4000 mg | SUBLINGUAL_TABLET | SUBLINGUAL | Status: DC | PRN

## 2014-03-19 NOTE — Progress Notes (Addendum)
Patient Name: Eduardo [name] Date of Encounter: 03/19/2014     Principal Problem:   Unstable angina Active Problems:   Angina pectoris    SUBJECTIVE  Has had chest tightness that comes and goes all evening, did not notify RN.  CURRENT MEDS . amLODipine  5 mg Oral Daily  . aspirin EC  81 mg Oral Daily  . benazepril  20 mg Oral Daily  . carvedilol  6.25 mg Oral BID WC  . ezetimibe  10 mg Oral Daily  . fenofibrate  160 mg Oral Daily  . heparin  5,000 Units Subcutaneous 3 times per day  . insulin aspart  0-9 Units Subcutaneous TID WC  . prednisoLONE acetate  1 drop Left Eye QID  . regadenoson      . regadenoson  0.4 mg Intravenous Once  . sertraline  100 mg Oral Daily  . tiotropium  18 mcg Inhalation Daily    OBJECTIVE  Filed Vitals:   03/18/14 1830 03/18/14 1845 03/18/14 1942 03/19/14 0526  BP: 117/66 128/78 117/72 116/62  Pulse: 69 68 64 62  Temp:   98.6 F (37 C) 98 F (36.7 C)  TempSrc:   Oral Oral  Resp:   18 16  Height:      Weight:    196 lb 3.4 oz (89 kg)  SpO2:   93% 94%    Intake/Output Summary (Last 24 hours) at 03/19/14 0824 Last data filed at 03/19/14 0528  Gross per 24 hour  Intake  274.2 ml  Output   2050 ml  Net -1775.8 ml   Filed Weights   03/17/14 2145 03/18/14 0551 03/19/14 0526  Weight: 197 lb 3.2 oz (89.449 kg) 197 lb 9.6 oz (89.631 kg) 196 lb 3.4 oz (89 kg)    PHYSICAL EXAM  General: Pleasant, NAD. Neuro: Alert and oriented X 3. Moves all extremities spontaneously. Sleepy. Psych: Normal affect. HEENT:  Normal  Lungs:  Resp regular and unlabored, CTA. Heart: RRR no s3, s4, or murmurs. Abdomen: Soft, non-tender, non-distended, BS + x 4.  Extremities: No clubbing, cyanosis or edema. DP/PT/Radials 2+ and equal bilaterally.  Accessory Clinical Findings  CBC  Recent Labs  03/18/14 1847 03/19/14 0550  WBC 4.9 5.8  HGB 15.4 15.9  HCT 44.3 46.3  MCV 91.0 93.9  PLT 166 712   Basic Metabolic Panel  Recent Labs  03/18/14 0031 03/18/14 0546 03/18/14 1847  NA 136* 140  --   K 3.9 3.9  --   CL 99 102  --   CO2 23 26  --   GLUCOSE 125* 143*  --   BUN 16 15  --   CREATININE 0.70 0.66 0.76  CALCIUM 9.0 8.8  --   MG 1.4*  --   --    Liver Function Tests  Recent Labs  03/18/14 0031  AST 23  ALT 21  ALKPHOS 40  BILITOT 0.5  PROT 6.6  ALBUMIN 3.7   No results found for this basename: LIPASE, AMYLASE,  in the last 72 hours Cardiac Enzymes  Recent Labs  03/18/14 0031 03/18/14 0546 03/18/14 1153  TROPONINI <0.30 <0.30 <0.30   Fasting Lipid Panel  Recent Labs  03/18/14 0546  CHOL 151  HDL 30*  LDLCALC 81  TRIG 200*  CHOLHDL 5.0   TELE 1st Degree block; Occasional PVC's.     ECG  Rate 60, Sinus rhythm with 1st degree AV block, normal axis, no hypertrophy. No ST abnormalities.  Radiology/Studies  Dg Chest 2  View  03/18/2014   CLINICAL DATA:  Abnormal chest radiograph question nodular density versus summation artifact in LEFT upper lobe  EXAM: CHEST  2 VIEW  COMPARISON:  03/17/2014  FINDINGS: Upper normal heart size.  Normal mediastinal contours and pulmonary vascularity.  Lungs clear.  No pleural effusion or pneumothorax.  No LEFT upper lobe nodular density identified ; prior finding is presumed to been a summation artifact.  Osseous structures unremarkable.  IMPRESSION: No acute abnormalities.   Electronically Signed   By: Lavonia Dana M.D.   On: 03/18/2014 10:37    ASSESSMENT AND PLAN  1. Chest pain concerning for unstable angina   - history of CAD s/p prior balloon angioplasties, last reportedly in 2014   - troponin negative, EKG nonacute   - Left Heart Cath on 03/18/2014 showed Left main with 20% stenosis, LAD 50% stenosis in mid vessel, 40% ostial LCx lesion, proximal RCA with Shepherd's crook deformity and moderate stenosis of 60-70%, mid RCA 60% stenosis, and distal RCA 30% stenosis. LVEF at 55-65%. Due to difficulty of assessment, planning to have a stress Myoview  this AM and if mild ischemia will treat medically and if significant ischemia, will use the information gathered from the study to assist in further treatment, per Dr. Martinique .    2. HTN in context of sinus bradycardia   - metoprolol was changed to carvedilol on admission for improved BP control/low HR  - HR has been in the 60's-70s over past 24 hours.  - Carvedilol at 6.25mg  BID  - Amlodipine at 5mg  daily.    3. ? Aortic root dilitation   - recently diagnosed at [facility] by echo - was not told it was an aneurysm but told "the main artery just after the valve was enlarged and I would have to keep my blood pressure under control"   4. COPD   - follow on beta blocker therapy - not actively wheezing   5. Diabetes mellitus   - Metformin on hold   - SSI   6. Hyperlipidemia   - intolerant of many statins - on Zetia/fenofibrate   - LDL 81   7. Abnormal CXR  - Morehead film: left base subsegmental atx, no infiltrate. Question nodular density vs overlapping structures peripheral aspect left upper lung  - Recheck CXR on 03-18-2014 showed no acute abnormalities.   Hilbert Corrigan PA-C Pager: [phone]  History and all data above reviewed.  Patient examined.  I agree with the findings as above.  He has not had further chest pain.  No SOB.   The patient exam reveals COR:RRR  ,  Lungs: Clear  ,  Abd: Positive bowel sounds, no rebound no guarding, Ext No edema.  .  All available labs, radiology testing, previous records reviewed. Agree with documented assessment and plan. CAD:  Cath as reported.  If no ischemia on perfusion imaging then no further intervention will be planned.   He could be discharged on meds as on MAR.  He could follow in [place] since he lives there.  HTN:  He is very concerned about this.  However, BP has been very good.  Continue current meds.   Jeneen Rinks Imre Vecchione  11:40 AM  03/19/2014

## 2014-03-19 NOTE — Discharge Summary (Signed)
Physician Discharge Summary       Patient ID: Eduardo [name] MRN: [id number] DOB/AGE: March 29, 1946 68 y.o.  Admit date: 03/17/2014 Discharge date: 03/19/2014  Discharge Diagnoses:  Principal Problem:   Unstable angina Active Problems:   CAD (coronary artery disease)   HTN (hypertension)   Hyperlipidemia   Diabetes   Angina pectoris   Discharged Condition: good  Procedures: 03/18/14 by Dr. Martinique cardiac cath  "It is difficult to tell what the culprit is for his chest pain which is somewhat atypical. It sounds like he has had PTCA of the OM2 in the past (probably did not want to jail OM1 with a stent). His RCA disease appears more severe but is difficult to assess accurately on angiography due to heavy calcificationm, tortuosity, and eccentric plaque. PCI of the RCA would be complex due to marked tortuosity and calcification. I think it would be very helpful to have a functional assessment of his ischemic burden and location to help guide the need for further intervention. Will arrange a stress Myoview. If he has no or mild ischemia I would treat him medically.  Hospital Course: 5M with CAD s/p balloon angioplasty without stent placement, HTN, HL, COPD, DM and ascending aortic aneurysm here with angina. Patient reports an episode of mid-sternal chest pressure while golfing this AM. He has noted episodes of exertional and non-exertional CP over the last 1.5 weeks, though today was the longest episode. He also noted neck and L jaw pain. The episode on admit was 7-8/10 chest pressure and was not associated with SOB, nausea or diaphoresis. It was not alleviated with rest, so he decided to present to the ED for evaluation.   [name] reports several balloon angioplasties, but no stents. He last had angioplasty of an unknown vessel at [facility] at sometime in 2014. He also reports that he was diagnosed with an ascending aortic aneurysm 2 weeks ago at the [facility] He is unsure  of how large it is. He was told to keep his BP down and that it did not require intervention at this time. He monitors his BP at home and it has been running in the 150s-170s. He is unsure of what medications he is taking other than metoprolol, metformin and ASA. He is intolerant of many different statins.   He was admitted and metoprolol was changed to coreg.  He was scheduled for cardiac cath the next AM revealing:" difficult to tell what the culprit is for his chest pain which is somewhat atypical. It sounds like he has had PTCA of the OM2 in the past (probably did not want to jail OM1 with a stent). His RCA disease appears more severe but is difficult to assess accurately on angiography due to heavy calcificationm, tortuosity, and eccentric plaque. PCI of the RCA would be complex due to marked tortuosity and calcification."   Lexiscan myoview was completed today with:  Moderate sized, small intensity defect involving the mid and  basal inferior and inferoseptal walls. There is a small amount of  reversibility in the mid and basal inferolateral wall which could  represent ischemia. There is evidence of soft tissue attenuation as  well as diaphragmatic attenuation that could also account for  variations in uptake. Cannot rule out a small area of ischemia vs.  attenuatiion artifact.  2. Normal left ventricular wall motion.  3. Left ventricular ejection fraction 64%  4. Intermediate-risk stress test findings. I reviewed results with Dr. Mare Ferrari and the pt and he will be discharged  today and follow up in [place].  HTN in context of sinus bradycardia  - metoprolol was changed to carvedilol on admission for improved BP control/low HR  - HR has been in the 60's-70s over past 24 hours.  - Carvedilol at 6.25mg  BID  - Amlodipine at 5mg  daily  ? Aortic root dilitation  - recently diagnosed at [facility] by echo - was not told it was an aneurysm but told "the main artery just after the valve was enlarged and I  would have to keep my blood pressure under control  COPD  - follow on beta blocker therapy - not actively wheezing   Diabetes mellitus  - Metformin on hold until the 23rd due to possible interaction with cath dye.  Hyperlipidemia  - intolerant of many statins - on Zetia/fenofibrate  - LDL 81   Abnormal CXR  - Morehead film: left base subsegmental atx, no infiltrate. Question nodular density vs overlapping structures peripheral aspect left upper lung  - Recheck CXR on 03-18-2014 showed no acute abnormalities   Consults: cardiology  Significant Diagnostic Studies:  BMET    Component Value Date/Time   NA 140 03/18/2014 0546   NA 141 09/20/2013 1024   K 3.9 03/18/2014 0546   CL 102 03/18/2014 0546   CO2 26 03/18/2014 0546   GLUCOSE 143* 03/18/2014 0546   GLUCOSE 112* 09/20/2013 1024   BUN 15 03/18/2014 0546   BUN 20 09/20/2013 1024   CREATININE 0.76 03/18/2014 1847   CALCIUM 8.8 03/18/2014 0546   GFRNONAA >90 03/18/2014 1847   GFRAA >90 03/18/2014 1847    CBC    Component Value Date/Time   WBC 5.8 03/19/2014 0550   RBC 4.93 03/19/2014 0550   HGB 15.9 03/19/2014 0550   HCT 46.3 03/19/2014 0550   PLT 192 03/19/2014 0550   MCV 93.9 03/19/2014 0550   MCH 32.3 03/19/2014 0550   MCHC 34.3 03/19/2014 0550   RDW 12.7 03/19/2014 0550    Troponin <0.30  Lipid Panel     Component Value Date/Time   CHOL 151 03/18/2014 0546   TRIG 200* 03/18/2014 0546   HDL 30* 03/18/2014 0546   HDL 36* 09/20/2013 1024   CHOLHDL 5.0 03/18/2014 0546   CHOLHDL 4.1 09/20/2013 1024   VLDL 40 03/18/2014 0546   LDLCALC 81 03/18/2014 0546   LDLCALC 68 09/20/2013 1024    CHEST 2 VIEW  COMPARISON: 03/17/2014  FINDINGS:  Upper normal heart size.  Normal mediastinal contours and pulmonary vascularity.  Lungs clear.  No pleural effusion or pneumothorax.  No LEFT upper lobe nodular density identified ; prior finding is  presumed to been a summation artifact.  Osseous structures unremarkable.    IMPRESSION:  No acute abnormalities.  Carotid Dopplers: The vertebral arteries appear patent with antegrade flow. - Findings consistent with 1-39 percent stenosis involving the right internal carotid artery and the left internal carotid artery. - ICA/CCA ratio. right = 0.99. left = 0.92.  EKG: Sinus rhythm with 1st degree A-V block Otherwise normal ECG  Discharge Exam: Blood pressure 120/70, pulse 73, temperature 99 F (37.2 C), temperature source Oral, resp. rate 16, height 5\' 8"  (1.727 m), weight 196 lb 3.4 oz (89 kg), SpO2 94.00%.   Disposition: 01-Home or Self Care     Medication List    STOP taking these medications       metoprolol 50 MG tablet  Commonly known as:  LOPRESSOR      TAKE these medications  albuterol 108 (90 BASE) MCG/ACT inhaler  Commonly known as:  PROVENTIL HFA;VENTOLIN HFA  Inhale 1 puff into the lungs every 6 (six) hours as needed for wheezing or shortness of breath.     amLODipine-benazepril 5-20 MG per capsule  Commonly known as:  LOTREL  Take 1 capsule by mouth daily.     aspirin EC 81 MG tablet  Take 81 mg by mouth daily.     carvedilol 6.25 MG tablet  Commonly known as:  COREG  Take 1 tablet (6.25 mg total) by mouth 2 (two) times daily with a meal.     ezetimibe 10 MG tablet  Commonly known as:  ZETIA  Take 10 mg by mouth daily.     fenofibrate micronized 134 MG capsule  Commonly known as:  LOFIBRA  Take 1 capsule (134 mg total) by mouth daily before breakfast.     meloxicam 15 MG tablet  Commonly known as:  MOBIC  Take 1 tablet (15 mg total) by mouth daily.     metFORMIN 500 MG tablet  Commonly known as:  GLUCOPHAGE  Take 1 tablet (500 mg total) by mouth 2 (two) times daily with a meal.  Start taking on:  03/21/2014     nitroGLYCERIN 0.4 MG SL tablet  Commonly known as:  NITROSTAT  Place 1 tablet (0.4 mg total) under the tongue every 5 (five) minutes x 3 doses as needed for chest pain.     prednisoLONE acetate 1  % ophthalmic suspension  Commonly known as:  PRED FORTE  Place 1 drop into the left eye 4 (four) times daily.     sertraline 100 MG tablet  Commonly known as:  ZOLOFT  Take 100 mg by mouth daily.     tiotropium 18 MCG inhalation capsule  Commonly known as:  SPIRIVA  Place 18 mcg into inhaler and inhale daily.       Follow-up Information   Follow up with Herminio Commons, MD On 04/04/2014. (at 11:00 AM )    Specialty:  Cardiology   Contact information:   [place] [address] [phone]        Discharge Instructions: Heart healthy Diabetic diet.  If you develop chest pain severe go to ER, if mild try NTG but let the [facility] office know.   Call [facility] [phone] if any bleeding, swelling or drainage at cath site.  May shower, no tub baths for 48 hours for groin sticks. No lifting over 5 pound for 3 days, no driving for 3 days.  No Metformin until the 23rd, it may interact with cath dye.  We stopped you metoprolol and placed you on coreg.   Take 1 NTG, under your tongue, while sitting.  If no relief of pain may repeat NTG, one tab every 5 minutes up to 3 tablets total over 15 minutes.  If no relief CALL 911.  If you have dizziness/lightheadness  while taking NTG, stop taking and call 911.             Signed: Isaiah Serge Nurse Practitioner-Certified [facility]: [facility]ART[facility] 03/19/2014, 4:36 PM  Time spent on discharge : 35 minutes.    Patient seen and examined.  Plan as discussed in my rounding note for today and outlined above. Jeneen Rinks [facility]  03/19/2014  5:09 PM

## 2014-03-19 NOTE — Discharge Instructions (Signed)
Heart healthy Diabetic diet.  If you develop chest pain severe go to ER, if mild try NTG but let the [facility] office know.   Call [facility] [phone] if any bleeding, swelling or drainage at cath site.  May shower, no tub baths for 48 hours for groin sticks. No lifting over 5 pound for 3 days, no driving for 3 days.  No Metformin until the 23rd, it may interact with cath dye.  We stopped you metoprolol and placed you on coreg.   Take 1 NTG, under your tongue, while sitting.  If no relief of pain may repeat NTG, one tab every 5 minutes up to 3 tablets total over 15 minutes.  If no relief CALL 911.  If you have dizziness/lightheadness  while taking NTG, stop taking and call 911.         Radial Site Care Refer to this sheet in the next few weeks. These instructions provide you with information on caring for yourself after your procedure. Your caregiver may also give you more specific instructions. Your treatment has been planned according to current medical practices, but problems sometimes occur. Call your caregiver if you have any problems or questions after your procedure. HOME CARE INSTRUCTIONS  You may shower the day after the procedure.Remove the bandage (dressing) and gently wash the site with plain soap and water.Gently pat the site dry.  Do not apply powder or lotion to the site.  Do not submerge the affected site in water for 3 to 5 days.  Inspect the site at least twice daily.  Do not flex or bend the affected arm for 24 hours.  No lifting over 5 pounds (2.3 kg) for 5 days after your procedure.  Do not drive home if you are discharged the same day of the procedure. Have someone else drive you.  You may drive 24 hours after the procedure unless otherwise instructed by your caregiver.  Do not operate machinery or power tools for 24 hours.  A responsible adult should be with you for the first 24 hours after you arrive home. What to expect:  Any bruising will  usually fade within 1 to 2 weeks.  Blood that collects in the tissue (hematoma) may be painful to the touch. It should usually decrease in size and tenderness within 1 to 2 weeks. SEEK IMMEDIATE MEDICAL CARE IF:  You have unusual pain at the radial site.  You have redness, warmth, swelling, or pain at the radial site.  You have drainage (other than a small amount of blood on the dressing).  You have chills.  You have a fever or persistent symptoms for more than 72 hours.  You have a fever and your symptoms suddenly get worse.  Your arm becomes pale, cool, tingly, or numb.  You have heavy bleeding from the site. Hold pressure on the site. Document Released: 06/18/2010 Document Revised: 08/08/2011 Document Reviewed: 06/18/2010 [facility] Patient Information 2015 [place], [place]. This information is not intended to replace advice given to you by your health care provider. Make sure you discuss any questions you have with your health care provider.

## 2014-03-19 NOTE — Progress Notes (Signed)
Lexiscan myoview completed without complications.  No pain.  Nuc results to follow.

## 2014-03-19 NOTE — Progress Notes (Signed)
*  PRELIMINARY RESULTS* Vascular Ultrasound Carotid Duplex (Doppler) has been completed.  Preliminary findings: Bilateral:  1-39% ICA stenosis.  Vertebral artery flow is antegrade.      Landry Mellow, RDMS, RVT  03/19/2014, 11:26 AM

## 2014-03-21 ENCOUNTER — Other Ambulatory Visit: Payer: Self-pay | Admitting: *Deleted

## 2014-03-21 MED ORDER — CARVEDILOL 6.25 MG PO TABS: 6.2500 mg | ORAL_TABLET | Freq: Two times a day (BID) | ORAL | Status: DC

## 2014-03-21 MED ORDER — NITROGLYCERIN 0.4 MG SL SUBL: 0.4000 mg | SUBLINGUAL_TABLET | SUBLINGUAL | Status: DC | PRN

## 2014-03-21 NOTE — Telephone Encounter (Signed)
Medication quantity adjusted to 90 day supply and sent electronically

## 2014-03-27 ENCOUNTER — Other Ambulatory Visit: Payer: Self-pay

## 2014-04-01 ENCOUNTER — Other Ambulatory Visit: Payer: Self-pay | Admitting: Cardiology

## 2014-04-01 DIAGNOSIS — Z0000 Encounter for general adult medical examination without abnormal findings: Secondary | ICD-10-CM | POA: Diagnosis not present

## 2014-04-01 DIAGNOSIS — M5440 Lumbago with sciatica, unspecified side: Secondary | ICD-10-CM | POA: Diagnosis not present

## 2014-04-01 DIAGNOSIS — Z125 Encounter for screening for malignant neoplasm of prostate: Secondary | ICD-10-CM | POA: Diagnosis not present

## 2014-04-01 DIAGNOSIS — I10 Essential (primary) hypertension: Secondary | ICD-10-CM | POA: Diagnosis not present

## 2014-04-01 DIAGNOSIS — E119 Type 2 diabetes mellitus without complications: Secondary | ICD-10-CM | POA: Diagnosis not present

## 2014-04-01 NOTE — Telephone Encounter (Signed)
Reference#-[id number] Need to talk to somebody concerning his Carvedilol and Nitro Stat.

## 2014-04-02 ENCOUNTER — Telehealth: Payer: Self-pay | Admitting: *Deleted

## 2014-04-02 MED ORDER — CARVEDILOL 6.25 MG PO TABS: 6.2500 mg | ORAL_TABLET | Freq: Two times a day (BID) | ORAL | Status: DC

## 2014-04-02 NOTE — Telephone Encounter (Signed)
Spoke with wife, [place], who is a DPI.  Advised wife that pt's medications had been reordered correctly. The Carvedilol was called into [facility] and the Nitro Stat was called into [facility]. Advised wife that [facility] stated to call if wanted to pick up Nitro and the cost of the prescription. Wife stated understanding of what was going on. I apologized for the mix up and reassured her that all had been straightened out.

## 2014-04-02 NOTE — Telephone Encounter (Signed)
  Spoke with Verne Carrow, PharmD and gave verbal order to refill Carvedilol. Advised that Nitro Stat needed to be sent and had been to a Management consultant.

## 2014-04-04 ENCOUNTER — Encounter: Payer: Self-pay | Admitting: Cardiovascular Disease

## 2014-04-04 ENCOUNTER — Ambulatory Visit (INDEPENDENT_AMBULATORY_CARE_PROVIDER_SITE_OTHER): Payer: Medicare Other | Admitting: Cardiovascular Disease

## 2014-04-04 VITALS — BP 116/74 | HR 60 | Ht 68.0 in | Wt 202.0 lb

## 2014-04-04 DIAGNOSIS — I712 Thoracic aortic aneurysm, without rupture, unspecified: Secondary | ICD-10-CM

## 2014-04-04 DIAGNOSIS — E119 Type 2 diabetes mellitus without complications: Secondary | ICD-10-CM | POA: Diagnosis not present

## 2014-04-04 DIAGNOSIS — Z789 Other specified health status: Secondary | ICD-10-CM

## 2014-04-04 DIAGNOSIS — I10 Essential (primary) hypertension: Secondary | ICD-10-CM | POA: Diagnosis not present

## 2014-04-04 DIAGNOSIS — I200 Unstable angina: Secondary | ICD-10-CM

## 2014-04-04 DIAGNOSIS — Z889 Allergy status to unspecified drugs, medicaments and biological substances status: Secondary | ICD-10-CM

## 2014-04-04 DIAGNOSIS — I25118 Atherosclerotic heart disease of native coronary artery with other forms of angina pectoris: Secondary | ICD-10-CM | POA: Diagnosis not present

## 2014-04-04 DIAGNOSIS — Z87898 Personal history of other specified conditions: Secondary | ICD-10-CM | POA: Diagnosis not present

## 2014-04-04 DIAGNOSIS — Z9861 Coronary angioplasty status: Secondary | ICD-10-CM | POA: Diagnosis not present

## 2014-04-04 DIAGNOSIS — E785 Hyperlipidemia, unspecified: Secondary | ICD-10-CM

## 2014-04-04 DIAGNOSIS — Z9289 Personal history of other medical treatment: Secondary | ICD-10-CM

## 2014-04-04 DIAGNOSIS — I6523 Occlusion and stenosis of bilateral carotid arteries: Secondary | ICD-10-CM

## 2014-04-04 MED ORDER — CLOPIDOGREL BISULFATE 75 MG PO TABS: 75.0000 mg | ORAL_TABLET | Freq: Every day | ORAL | Status: DC

## 2014-04-04 NOTE — Progress Notes (Signed)
Patient ID: Karis Emig, male   DOB: 1945/06/24, 68 y.o.   MRN: [id number]      SUBJECTIVE: The patient returns for follow-up for severe coronary artery disease. He was hospitalized in October for chest pain and underwent coronary angiography on 03/18/14 by Dr. Martinique. This demonstrated complex calcified disease in the RCA with a moderate stenosis at the origin of OM 2 and borderline disease in the mid LAD. Left ventriculography demonstrated normal left ventricular systolic function, EF 10-93%, with no significant mitral regurgitation. Chest pain was felt to be somewhat atypical. It appeared that he likely underwent PTCA of the OM 2 in the past. The RCA had heavy calcification, tortuosity, and eccentric plaque. It was felt that PCI of the RCA would be complex due to this marked tortuosity and calcification, for which reason he underwent a functional assessment of his ischemic burden with a nuclear stress test.  Nuclear stress testing demonstrated a moderate sized, small intensity defect involving the mid and basal inferior and inferoseptal walls. There was a small amount of reversibility in the mid and basal inferolateral wall which could represent ischemia. However, there was evidence of soft tissue attenuation as well as diaphragmatic attenuation that could also have accounted for variations in radiotracer uptake.  It was felt that it would be best to treat him medically. He was switched from metoprolol to carvedilol but he has yet to receive this in the mail as it was called into the wrong pharmacy initially. He has been monitoring his blood pressures at home and if consistently been in the 162/89-92 mmHg range. He said that today is only 1 of 2 episodes where his blood pressure has been normal.   Prior to undergoing coronary angiography, he was unable to walk the first 9 holes while golfing but has been able to do so ever since being discharged. He is feeling better. He currently denies chest  pain and exertional dyspnea and has not had to use sublingual nitroglycerin.   He is entirely intolerant to statin therapy and tells me that he was tried on Livalo most recently and was only able to tolerate it for one month and then began experiencing diffuse myalgias.  He also has a thoracic aortic aneurysm which is followed by the [place].  Review of Systems: As per "subjective", otherwise negative.  Allergies  Allergen Reactions  . Statins Other (See Comments)    Body aches    Current Outpatient Prescriptions  Medication Sig Dispense Refill  . albuterol (PROVENTIL HFA;VENTOLIN HFA) 108 (90 BASE) MCG/ACT inhaler Inhale 1 puff into the lungs every 6 (six) hours as needed for wheezing or shortness of breath.     [name] amLODipine-benazepril (LOTREL) 5-20 MG per capsule Take 1 capsule by mouth daily. 90 capsule 3  . aspirin EC 81 MG tablet Take 81 mg by mouth daily.    . carvedilol (COREG) 6.25 MG tablet Take 1 tablet (6.25 mg total) by mouth 2 (two) times daily with a meal. 180 tablet 1  . ezetimibe (ZETIA) 10 MG tablet Take 10 mg by mouth daily.    . fenofibrate micronized (LOFIBRA) 134 MG capsule Take 1 capsule (134 mg total) by mouth daily before breakfast. 90 capsule 3  . meloxicam (MOBIC) 15 MG tablet Take 1 tablet (15 mg total) by mouth daily. 90 tablet 3  . metFORMIN (GLUCOPHAGE) 500 MG tablet Take 1 tablet (500 mg total) by mouth 2 (two) times daily with a meal.    . nitroGLYCERIN (NITROSTAT) 0.4 MG  SL tablet Place 1 tablet (0.4 mg total) under the tongue every 5 (five) minutes x 3 doses as needed for chest pain. 75 tablet 1  . prednisoLONE acetate (PRED FORTE) 1 % ophthalmic suspension Place 1 drop into the left eye 4 (four) times daily.     . sertraline (ZOLOFT) 100 MG tablet Take 100 mg by mouth daily.    [name] tiotropium (SPIRIVA) 18 MCG inhalation capsule Place 18 mcg into inhaler and inhale daily.    . clopidogrel (PLAVIX) 75 MG tablet Take 1 tablet (75 mg total) by mouth daily. 90  tablet 3   No current facility-administered medications for this visit.    Past Medical History  Diagnosis Date  . COPD (chronic obstructive pulmonary disease)   . CAD (coronary artery disease)   . Diabetes mellitus without complication   . Hypertension   . Neuromuscular disorder   . Sleep apnea   . Chemical exposure     agent orange   . PTSD (post-traumatic stress disorder)   . Diverticulitis   . Shortness of breath   . Unstable angina     Past Surgical History  Procedure Laterality Date  . Spine surgery    . Cardiac catheterization    . Cholecystectomy    . Shoulder surgery    . Knee surgery      right   . Ptca      unsuccesful  . Cervical spine surgery    . I&d extremity Left 05/30/2013    Procedure: IRRIGATION AND DEBRIDEMENT Left Hand and Foreign body removal;  Surgeon: [name], MD;  Location: [facility];  Service: Orthopedics;  Laterality: Left;  [name] Eye surgery Right     cataract  . Cardiac catheterization  03/18/14    difficult to determine culprit vesel  . Nm myoview ltd  03/19/14    mild inf. ischemia    History   Social History  . Marital Status: Married    Spouse Name: N/A    Number of Children: N/A  . Years of Education: N/A   Occupational History  . Not on file.   Social History Main Topics  . Smoking status: Former Smoker -- 0.75 packs/day for 30 years    Types: Cigarettes    Start date: 01/02/1961    Quit date: 05/31/1987  . Smokeless tobacco: Never Used  . Alcohol Use: No  . Drug Use: No  . Sexual Activity: Not on file   Other Topics Concern  . Not on file   Social History Narrative     Filed Vitals:   04/04/14 1054  BP: 116/74  Pulse: 60  Height: 5\' 8"  (1.727 m)  Weight: 202 lb (91.627 kg)  SpO2: 94%    PHYSICAL EXAM General: NAD HEENT: Normal. Neck: No JVD, no thyromegaly. Lungs: Clear to auscultation bilaterally with normal respiratory effort. CV: Nondisplaced PMI.  Regular rate and rhythm, normal S1/S2, no S3/S4,  no murmur. No pretibial or periankle edema.  No carotid bruit.  Normal pedal pulses.  Abdomen: Soft, nontender, no hepatosplenomegaly, no distention.  Neurologic: Alert and oriented x 3.  Psych: Normal affect. Skin: Normal. Musculoskeletal: Normal range of motion, no gross deformities. Extremities: No clubbing or cyanosis.   ECG: Most recent ECG reviewed.   Coronary angiography (03/18/14): Procedural Findings: Hemodynamics: AO 118/59 mean 85 mm Hg LV 115/12 mm Hg  Coronary angiography: Coronary dominance: right  Left mainstem: The left main is long and calcified with mild 20% mid vessel stenosis.  Left anterior descending (LAD): The LAD is moderately calcified. There is a 50% stenosis in the mid vessel between the first and second diagonals. The two diagonals arise in close proximity and are normal.  Left circumflex (LCx): There is a 40% ostial LCx lesion. The LCx bifurcates into 2 OM branches. The first OM has mild disease up to 20%. The second OM has a 60-70% stenosis at the ostium.   Right coronary artery (RCA): The RCA has a marked shepherd's crook deformity in the proximal vessel. At the acute bend there is an eccentric heavily calcified plaque with moderate stenosis estimated at 70-80%. The mid RCA is heavily calcified with eccentric 60% stenosis. The distal RCA is also calcified with diffuse 30% disease.  Left ventriculography: Left ventricular systolic function is normal, LVEF is estimated at 55-65%, there is no significant mitral regurgitation   Final Conclusions:  1. ASCAD. There is complex calcified disease in the RCA. There is a moderate stenosis at the origin of OM2 and borderline disease in the mid LAD.  2. Normal LV function.  Recommendations: It is difficult to tell what the culprit is for his chest pain which is somewhat atypical. It sounds like he has had PTCA of the OM2 in the past (probably did not want to jail OM1 with a stent). His RCA disease appears more  severe but is difficult to assess accurately on angiography due to heavy calcificationm, tortuosity, and eccentric plaque. PCI of the RCA would be complex due to marked tortuosity and calcification. I think it would be very helpful to have a functional assessment of his ischemic burden and location to help guide the need for further intervention. Will arrange a stress Myoview in the am. If he has no or mild ischemia I would treat him medically. If he has significant ischemia then location may help guide therapy.   Nuclear stress test 910-21-15): IMPRESSION: 1. Moderate sized, small intensity defect involving the mid and basal inferior and inferoseptal walls. There is a small amount of reversibility in the mid and basal inferolateral wall which could represent ischemia. There is evidence of soft tissue attenuation as well as diaphragmatic attenuation that could also account for variations in uptake. Cannot rule out a small area of ischemia vs. attenuatiion artifact.   ASSESSMENT AND PLAN: 1. CAD: At the present time, he is symptomatically stable. I will continue aspirin 81 mg. He is currently taking metoprolol until his carvedilol prescription arrives. He is intolerant to statin therapy. Given the severity and complexity of his RCA disease, I will add Plavix 75 mg daily. 2. Essential HTN: Markedly elevated for the past one month, which has been worrisome for the patient given his strong family history of MI and his thoracic aortic aneurysm. He is awaiting the arrival of his carvedilol. For the time being, I have asked him to increase amlodipine-benazepril 5-20 mg to twice daily. Once his carvedilol arrives, I asked him to reduce amlodipine-benazepril back to once daily. Carvedilol has been prescribed at 6.25 mg twice daily. 3. Hyperlipidemia: Intolerant to statin therapy. 03/18/14 HDL 30, LDL 81 mg. Continue Zetia 10 mg. 4. Thoracic aortic aneurysm: I have asked him to obtain the copy of the  echocardiogram report from the [place]. He may need CTA for more optimal clarification. 5. Type 2 diabetes: Relatively well controlled on metformin 500 mg twice daily 6. Carotid artery stenosis: Carotid Dopplers in October 2015 demonstrated 1-39% bilateral carotid artery stenosis. Continue aspirin and I am initiating Plavix for CAD. Intolerant  to statins.  Dispo: f/u 3 months.  Time spent: 40 minutes, greater than 50% was spent explaining the significance of his coronary artery disease and strategies to attenuate progression with medical therapy, as well as reviewing all relevant studies during his hospitalization with the patient.  [name], M.D., F.A.C.C.

## 2014-04-04 NOTE — Patient Instructions (Addendum)
   Begin Plavix 75mg  daily - 90 day supply sent to [facility]  Increase Lotrel to twice a day until you get the Coreg.  Afterwards, go back to daily as previously taking on the Lotrel.    Continue the Metoprolol twice a day until starting the Coreg.   Continue all other medications.   Follow up in  3 months

## 2014-04-07 DIAGNOSIS — F4310 Post-traumatic stress disorder, unspecified: Secondary | ICD-10-CM | POA: Diagnosis not present

## 2014-04-07 DIAGNOSIS — I519 Heart disease, unspecified: Secondary | ICD-10-CM | POA: Diagnosis not present

## 2014-04-07 DIAGNOSIS — Z9889 Other specified postprocedural states: Secondary | ICD-10-CM | POA: Diagnosis not present

## 2014-04-07 DIAGNOSIS — M5030 Other cervical disc degeneration, unspecified cervical region: Secondary | ICD-10-CM | POA: Diagnosis not present

## 2014-04-07 DIAGNOSIS — Z13820 Encounter for screening for osteoporosis: Secondary | ICD-10-CM | POA: Diagnosis not present

## 2014-04-07 DIAGNOSIS — I10 Essential (primary) hypertension: Secondary | ICD-10-CM | POA: Diagnosis not present

## 2014-04-07 DIAGNOSIS — E118 Type 2 diabetes mellitus with unspecified complications: Secondary | ICD-10-CM | POA: Diagnosis not present

## 2014-04-07 DIAGNOSIS — J449 Chronic obstructive pulmonary disease, unspecified: Secondary | ICD-10-CM | POA: Diagnosis not present

## 2014-04-07 DIAGNOSIS — G629 Polyneuropathy, unspecified: Secondary | ICD-10-CM | POA: Diagnosis not present

## 2014-04-30 ENCOUNTER — Telehealth: Payer: Self-pay | Admitting: Cardiovascular Disease

## 2014-04-30 DIAGNOSIS — I10 Essential (primary) hypertension: Secondary | ICD-10-CM

## 2014-04-30 NOTE — Telephone Encounter (Signed)
Patient reported to [facility] that his blood pressure was elevated today at 159/89. Vicky was instructed to let patient know that no doctor was in the office today for a visit and he could contact his PCP for a visit or concerns if he felt they needed to be addressed today. Also to inform patient that Dr. Bronson Ing would be back in the office tomorrow to review readings that he left last week. Vicky was also instructed to let patient know that MD was out of the office all last week and didn't get to review them on yesterday. Made an attempt to call patient back with these instructions but no answer.

## 2014-04-30 NOTE — Telephone Encounter (Signed)
Spoke with patient and he is frustrated that no one has made any contact with him in response to the BP readings he left at the office 2 weeks ago. Nurse apologized to patient for the delay and explained to patient that his doctor was out of the office all last week. Nurse assured patient that he would be contacted tomorrow with a response.

## 2014-04-30 NOTE — Telephone Encounter (Signed)
Eduardo [name] called wanting to know if Dr. Bronson Ing had read patient's BP readings that he dropped off.  States he is very concerned About his elevated BP.  He took BP at 8AM today 150./89.  This information was given to [name]. She suggested telling the patient to contact his Family doctor today. I relayed this information to the patient.

## 2014-05-01 MED ORDER — AMLODIPINE BESY-BENAZEPRIL HCL 5-20 MG PO CAPS: 1.0000 | ORAL_CAPSULE | Freq: Two times a day (BID) | ORAL | Status: DC

## 2014-05-01 NOTE — Telephone Encounter (Signed)
I have reviewed BP log. Did he increase amlodipine-benazepril 5-20 mg to twice daily as I suggested at the last visit? Is he taking 6.25 mg twice daily?

## 2014-05-01 NOTE — Telephone Encounter (Signed)
Spoke with patient to confirm doses of the below listed medications. Patient informed nurse that he was only taking the lotrel 5/20 daily. Per MD suggestion below, nurse advised patient to increase lotel 5/20 mg to twice daily, and continue monitoring his blood pressures at home. Nurse advised patient if he notices that his numbers are still high after the increase of lotrel 5/20 mg, to contact our office. Confirmed with Dr. Bronson Ing that lotrel 5/20 mg needed to be increased to twice daily instead of daily and he advised that patient should increase for better control of blood pressures. Patient verbalized understanding of plan.

## 2014-05-08 ENCOUNTER — Encounter (HOSPITAL_COMMUNITY): Payer: Self-pay | Admitting: Cardiology

## 2014-06-17 ENCOUNTER — Other Ambulatory Visit: Payer: Self-pay | Admitting: General Practice

## 2014-06-28 ENCOUNTER — Other Ambulatory Visit: Payer: Self-pay | Admitting: General Practice

## 2014-07-08 ENCOUNTER — Ambulatory Visit (INDEPENDENT_AMBULATORY_CARE_PROVIDER_SITE_OTHER): Payer: Medicare Other | Admitting: Cardiovascular Disease

## 2014-07-08 ENCOUNTER — Encounter: Payer: Self-pay | Admitting: Cardiovascular Disease

## 2014-07-08 VITALS — BP 123/73 | HR 66 | Ht 66.0 in | Wt 199.0 lb

## 2014-07-08 DIAGNOSIS — I10 Essential (primary) hypertension: Secondary | ICD-10-CM

## 2014-07-08 DIAGNOSIS — Z889 Allergy status to unspecified drugs, medicaments and biological substances status: Secondary | ICD-10-CM | POA: Diagnosis not present

## 2014-07-08 DIAGNOSIS — Z9861 Coronary angioplasty status: Secondary | ICD-10-CM

## 2014-07-08 DIAGNOSIS — I25118 Atherosclerotic heart disease of native coronary artery with other forms of angina pectoris: Secondary | ICD-10-CM | POA: Diagnosis not present

## 2014-07-08 DIAGNOSIS — I712 Thoracic aortic aneurysm, without rupture, unspecified: Secondary | ICD-10-CM

## 2014-07-08 DIAGNOSIS — E119 Type 2 diabetes mellitus without complications: Secondary | ICD-10-CM | POA: Diagnosis not present

## 2014-07-08 DIAGNOSIS — E785 Hyperlipidemia, unspecified: Secondary | ICD-10-CM

## 2014-07-08 DIAGNOSIS — I6523 Occlusion and stenosis of bilateral carotid arteries: Secondary | ICD-10-CM

## 2014-07-08 DIAGNOSIS — Z789 Other specified health status: Secondary | ICD-10-CM

## 2014-07-08 MED ORDER — CARVEDILOL 12.5 MG PO TABS: 12.5000 mg | ORAL_TABLET | Freq: Two times a day (BID) | ORAL | Status: DC

## 2014-07-08 NOTE — Progress Notes (Signed)
Patient ID: Eduardo [name], male   DOB: 23-Sep-1945, 69 y.o.   MRN: [id number]      SUBJECTIVE: The patient returns for follow-up for severe coronary artery disease. He was hospitalized in October 2015 for chest pain and underwent coronary angiography on 03/18/14 by Dr. Martinique. This demonstrated complex calcified disease in the RCA with a moderate stenosis at the origin of OM 2 and borderline disease in the mid LAD. Left ventriculography demonstrated normal left ventricular systolic function, EF 40-98%, with no significant mitral regurgitation. Chest pain was felt to be somewhat atypical. It appeared that he likely underwent PTCA of the OM 2 in the past. The RCA had heavy calcification, tortuosity, and eccentric plaque. It was felt that PCI of the RCA would be complex due to this marked tortuosity and calcification, for which reason he underwent a functional assessment of his ischemic burden with a nuclear stress test.  Nuclear stress testing demonstrated a moderate sized, small intensity defect involving the mid and basal inferior and inferoseptal walls. There was a small amount of reversibility in the mid and basal inferolateral wall which could represent ischemia. However, there was evidence of soft tissue attenuation as well as diaphragmatic attenuation that could also have accounted for variations in radiotracer uptake.  It was felt that it would be best to treat him medically. He is entirely intolerant to statin therapy. He also has a thoracic aortic aneurysm which is followed by the [place].  He denies chest pain, palpitations, and shortness of breath. He has been walking all 18 holes while golfing which equates to 7.2 miles on Monday, Wednesday, and Friday. He checks his blood pressure before taking his medications in the morning and evening and readings have been 148/88. He has been taking 162 mg of aspirin daily [facility]   Review of Systems: As per "subjective", otherwise  negative.  Allergies  Allergen Reactions  . Statins Other (See Comments)    Body aches    Current Outpatient Prescriptions  Medication Sig Dispense Refill  . albuterol (PROVENTIL HFA;VENTOLIN HFA) 108 (90 BASE) MCG/ACT inhaler Inhale 1 puff into the lungs every 6 (six) hours as needed for wheezing or shortness of breath.     [name] amLODipine-benazepril (LOTREL) 5-20 MG per capsule Take 1 capsule by mouth 2 (two) times daily. 180 capsule 3  . aspirin EC 81 MG tablet Take 162 mg by mouth daily.     . carvedilol (COREG) 6.25 MG tablet Take 1 tablet (6.25 mg total) by mouth 2 (two) times daily with a meal. 180 tablet 1  . clopidogrel (PLAVIX) 75 MG tablet Take 1 tablet (75 mg total) by mouth daily. 90 tablet 3  . ezetimibe (ZETIA) 10 MG tablet Take 10 mg by mouth daily.    . fenofibrate micronized (LOFIBRA) 134 MG capsule TAKE 1 CAPSULE DAILY BEFORE BREAKFAST 30 capsule 0  . meloxicam (MOBIC) 15 MG tablet TAKE 1 TABLET DAILY 30 tablet 0  . metFORMIN (GLUCOPHAGE) 1000 MG tablet Take 1,000 mg by mouth 2 (two) times daily with a meal.    . nitroGLYCERIN (NITROSTAT) 0.4 MG SL tablet Place 1 tablet (0.4 mg total) under the tongue every 5 (five) minutes x 3 doses as needed for chest pain. 75 tablet 1  . sertraline (ZOLOFT) 100 MG tablet Take 100 mg by mouth daily.    [name] tiotropium (SPIRIVA) 18 MCG inhalation capsule Place 18 mcg into inhaler and inhale daily.     No current facility-administered medications  for this visit.    Past Medical History  Diagnosis Date  . COPD (chronic obstructive pulmonary disease)   . CAD (coronary artery disease)   . Diabetes mellitus without complication   . Hypertension   . Neuromuscular disorder   . Sleep apnea   . Chemical exposure     agent orange   . PTSD (post-traumatic stress disorder)   . Diverticulitis   . Shortness of breath   . Unstable angina     Past Surgical History  Procedure Laterality Date  . Spine surgery    . Cardiac catheterization      . Cholecystectomy    . Shoulder surgery    . Knee surgery      right   . Ptca      unsuccesful  . Cervical spine surgery    . I&d extremity Left 05/30/2013    Procedure: IRRIGATION AND DEBRIDEMENT Left Hand and Foreign body removal;  Surgeon: [name], MD;  Location: [facility];  Service: Orthopedics;  Laterality: Left;  [name] Eye surgery Right     cataract  . Cardiac catheterization  03/18/14    difficult to determine culprit vesel  . Nm myoview ltd  03/19/14    mild inf. ischemia  . Left heart catheterization with coronary angiogram N/A 03/18/2014    Procedure: LEFT HEART CATHETERIZATION WITH CORONARY ANGIOGRAM;  Surgeon: [name], MD;  Location: [facility] CATH LAB;  Service: Cardiovascular;  Laterality: N/A;    History   Social History  . Marital Status: Married    Spouse Name: N/A    Number of Children: N/A  . Years of Education: N/A   Occupational History  . Not on file.   Social History Main Topics  . Smoking status: Former Smoker -- 0.75 packs/day for 30 years    Types: Cigarettes    Start date: 01/02/1961    Quit date: 05/31/1987  . Smokeless tobacco: Never Used  . Alcohol Use: No  . Drug Use: No  . Sexual Activity: Not on file   Other Topics Concern  . Not on file   Social History Narrative     Filed Vitals:   07/08/14 0814  BP: 123/73  Pulse: 66  Height: 5\' 6"  (1.676 m)  Weight: 199 lb (90.266 kg)    PHYSICAL EXAM General: NAD HEENT: Normal. Neck: No JVD, no thyromegaly. Lungs: Clear to auscultation bilaterally with normal respiratory effort. CV: Nondisplaced PMI.  Regular rate and rhythm, normal S1/S2, no S3/S4, no murmur. No pretibial or periankle edema.  No carotid bruit.  Normal pedal pulses.  Abdomen: Soft, nontender, no hepatosplenomegaly, no distention.  Neurologic: Alert and oriented x 3.  Psych: Normal affect. Skin: Normal. Musculoskeletal: Normal range of motion, no gross deformities. Extremities: No clubbing or cyanosis.   ECG:  Most recent ECG reviewed.      ASSESSMENT AND PLAN: 1. CAD: Stable ischemic heart disease. I will continue aspirin (reduce back to 81 mg) and Coreg. He is intolerant to statin therapy. Given the severity and complexity of his RCA disease, I will continue Plavix 75 mg daily. 2. Essential HTN: Mildly elevated at home on amlodipine-benazepril 5/20 mg bid and Coreg 6.25 mg bid. Will increase Coreg to 12.5 mg bid. I have asked him to call my office in 2 weeks to inform me how he is feeling with regards to potential sluggishness or fatigue. 3. Hyperlipidemia: Intolerant to statin therapy. 03/18/14 HDL 30, LDL 81 mg. Continue Zetia 10 mg. 4. Thoracic  aortic aneurysm: I have asked him to obtain the copy of the echocardiogram report from the [place]. He may need CTA for more optimal clarification. 5. Type 2 diabetes: Relatively well controlled on metformin 500 mg twice daily 6. Carotid artery stenosis: Carotid Dopplers in October 2015 demonstrated 1-39% bilateral carotid artery stenosis. Continue aspirin and Plavix. Intolerant to statins.  Dispo: f/u 4 months.   [name], M.D., F.A.C.C.

## 2014-07-08 NOTE — Patient Instructions (Addendum)
   Decrease Aspirin to 81mg   Daily  Increase Coreg to 12.5mg  twice a day  - new 90 day supply + 3 refills sent to [facility] today.  You may take 2 tabs of your 6.25mg  tablet twice a day till finish current supply. Continue all other medications.   Call office with update on how feeling in 2 weeks.  Your physician wants you to follow up in: 4 months.  You will receive a reminder letter in the mail one-two months in advance.  If you don't receive a letter, please call our office to schedule the follow up appointment

## 2014-09-25 ENCOUNTER — Telehealth: Payer: Self-pay | Admitting: Cardiovascular Disease

## 2014-09-25 DIAGNOSIS — I10 Essential (primary) hypertension: Secondary | ICD-10-CM

## 2014-09-25 MED ORDER — AMLODIPINE BESY-BENAZEPRIL HCL 5-20 MG PO CAPS: 1.0000 | ORAL_CAPSULE | Freq: Two times a day (BID) | ORAL | Status: DC

## 2014-09-25 MED ORDER — AMLODIPINE BESY-BENAZEPRIL HCL 5-20 MG PO CAPS
1.0000 | ORAL_CAPSULE | Freq: Two times a day (BID) | ORAL | Status: DC
Start: 2014-09-25 — End: 2014-09-25

## 2014-09-25 NOTE — Telephone Encounter (Signed)
Needs refill on  amLODipine-benazepril (LOTREL) 5-20 MG per capsule  Medication     He has questions about how he is suppose to be taking this medication.

## 2014-09-25 NOTE — Telephone Encounter (Signed)
Pt ran out of Lotrel is going out of town, needs [facility] month [facility] [place] and request other refills be sent to [facility]. [facility] [place] [facility]. Pt made aware.

## 2014-10-31 ENCOUNTER — Encounter: Payer: Self-pay | Admitting: *Deleted

## 2014-11-03 ENCOUNTER — Other Ambulatory Visit: Payer: Self-pay | Admitting: *Deleted

## 2014-11-03 DIAGNOSIS — I10 Essential (primary) hypertension: Secondary | ICD-10-CM

## 2014-11-03 MED ORDER — AMLODIPINE BESY-BENAZEPRIL HCL 5-20 MG PO CAPS: 1.0000 | ORAL_CAPSULE | Freq: Two times a day (BID) | ORAL | Status: DC

## 2014-11-04 ENCOUNTER — Ambulatory Visit (INDEPENDENT_AMBULATORY_CARE_PROVIDER_SITE_OTHER): Payer: Medicare Other | Admitting: Cardiovascular Disease

## 2014-11-04 ENCOUNTER — Encounter: Payer: Self-pay | Admitting: Cardiovascular Disease

## 2014-11-04 VITALS — BP 113/70 | HR 59 | Ht 66.0 in | Wt 195.0 lb

## 2014-11-04 DIAGNOSIS — I2510 Atherosclerotic heart disease of native coronary artery without angina pectoris: Secondary | ICD-10-CM

## 2014-11-04 DIAGNOSIS — I10 Essential (primary) hypertension: Secondary | ICD-10-CM | POA: Diagnosis not present

## 2014-11-04 DIAGNOSIS — Z9861 Coronary angioplasty status: Secondary | ICD-10-CM

## 2014-11-04 DIAGNOSIS — E785 Hyperlipidemia, unspecified: Secondary | ICD-10-CM

## 2014-11-04 DIAGNOSIS — Z889 Allergy status to unspecified drugs, medicaments and biological substances status: Secondary | ICD-10-CM

## 2014-11-04 DIAGNOSIS — I6523 Occlusion and stenosis of bilateral carotid arteries: Secondary | ICD-10-CM

## 2014-11-04 DIAGNOSIS — I712 Thoracic aortic aneurysm, without rupture, unspecified: Secondary | ICD-10-CM

## 2014-11-04 DIAGNOSIS — E119 Type 2 diabetes mellitus without complications: Secondary | ICD-10-CM | POA: Diagnosis not present

## 2014-11-04 DIAGNOSIS — Z789 Other specified health status: Secondary | ICD-10-CM

## 2014-11-04 NOTE — Patient Instructions (Signed)
Continue all current medications. Your physician wants you to follow up in: 6 months.  You will receive a reminder letter in the mail one-two months in advance.  If you don't receive a letter, please call our office to schedule the follow up appointment   

## 2014-11-04 NOTE — Progress Notes (Signed)
Patient ID: Eduardo [name], male   DOB: 11/21/1945, 69 y.o.   MRN: [id number]      SUBJECTIVE: The patient returns for follow-up for coronary artery disease. He was hospitalized in October 2015 for chest pain and underwent coronary angiography on 03/18/14 by Dr. Martinique. This demonstrated complex calcified disease in the RCA with a moderate stenosis at the origin of OM 2 and borderline disease in the mid LAD. Left ventriculography demonstrated normal left ventricular systolic function, EF 37-62%, with no significant mitral regurgitation. Chest pain was felt to be somewhat atypical. It appeared that he likely underwent PTCA of the OM 2 in the past. The RCA had heavy calcification, tortuosity, and eccentric plaque. It was felt that PCI of the RCA would be complex due to this marked tortuosity and calcification, for which reason he underwent a functional assessment of his ischemic burden with a nuclear stress test.  Nuclear stress testing demonstrated a moderate sized, small intensity defect involving the mid and basal inferior and inferoseptal walls. There was a small amount of reversibility in the mid and basal inferolateral wall which could represent ischemia. However, there was evidence of soft tissue attenuation as well as diaphragmatic attenuation that could also have accounted for variations in radiotracer uptake.  It was felt that it would be best to treat him medically. He is entirely intolerant to statin therapy.  He denies chest pain, palpitations, and shortness of breath.   He and his wife recently returned from a trip to [place] where they spent 10 days at [facility]. They are going to [facility] next month and [place], [place] in September.  He is planning on getting his thoracic aortic aneurysm checked next month at the [place].  Review of Systems: As per "subjective", otherwise negative.  Allergies  Allergen Reactions  . Statins Other (See Comments)    Body aches    Current  Outpatient Prescriptions  Medication Sig Dispense Refill  . amLODipine-benazepril (LOTREL) 5-20 MG per capsule Take 1 capsule by mouth daily.    [name] aspirin EC 81 MG tablet Take 81 mg by mouth 2 (two) times daily.     . carvedilol (COREG) 6.25 MG tablet Take 6.25 mg by mouth 2 (two) times daily with a meal.    . ezetimibe (ZETIA) 10 MG tablet Take 10 mg by mouth daily.    . fenofibrate micronized (LOFIBRA) 134 MG capsule TAKE 1 CAPSULE DAILY BEFORE BREAKFAST 30 capsule 0  . meloxicam (MOBIC) 15 MG tablet TAKE 1 TABLET DAILY 30 tablet 0  . metFORMIN (GLUCOPHAGE) 1000 MG tablet Take 1,000 mg by mouth 2 (two) times daily with a meal.    . nitroGLYCERIN (NITROSTAT) 0.4 MG SL tablet Place 1 tablet (0.4 mg total) under the tongue every 5 (five) minutes x 3 doses as needed for chest pain. 75 tablet 1  . sertraline (ZOLOFT) 100 MG tablet Take 100 mg by mouth daily.    [name] tiotropium (SPIRIVA) 18 MCG inhalation capsule Place 18 mcg into inhaler and inhale daily.    [name] albuterol (PROVENTIL HFA;VENTOLIN HFA) 108 (90 BASE) MCG/ACT inhaler Inhale 1 puff into the lungs every 6 (six) hours as needed for wheezing or shortness of breath.     . clopidogrel (PLAVIX) 75 MG tablet Take 1 tablet (75 mg total) by mouth daily. (Patient not taking: Reported on 11/04/2014) 90 tablet 3   No current facility-administered medications for this visit.    Past Medical History  Diagnosis Date  . COPD (chronic obstructive  pulmonary disease)   . CAD (coronary artery disease)   . Diabetes mellitus without complication   . Hypertension   . Neuromuscular disorder   . Sleep apnea   . Chemical exposure     agent orange   . PTSD (post-traumatic stress disorder)   . Diverticulitis   . Shortness of breath   . Unstable angina     Past Surgical History  Procedure Laterality Date  . Spine surgery    . Cardiac catheterization    . Cholecystectomy    . Shoulder surgery    . Knee surgery      right   . Ptca      unsuccesful  .  Cervical spine surgery    . I&d extremity Left 05/30/2013    Procedure: IRRIGATION AND DEBRIDEMENT Left Hand and Foreign body removal;  Surgeon: [name], MD;  Location: [place];  Service: Orthopedics;  Laterality: Left;  [name] Eye surgery Right     cataract  . Cardiac catheterization  03/18/14    difficult to determine culprit vesel  . Nm myoview ltd  03/19/14    mild inf. ischemia  . Left heart catheterization with coronary angiogram N/A 03/18/2014    Procedure: LEFT HEART CATHETERIZATION WITH CORONARY ANGIOGRAM;  Surgeon: [name], MD;  Location: [facility] CATH LAB;  Service: Cardiovascular;  Laterality: N/A;    History   Social History  . Marital Status: Married    Spouse Name: N/A  . Number of Children: N/A  . Years of Education: N/A   Occupational History  . Not on file.   Social History Main Topics  . Smoking status: Former Smoker -- 0.75 packs/day for 30 years    Types: Cigarettes    Start date: 01/02/1961    Quit date: 05/31/1987  . Smokeless tobacco: Never Used  . Alcohol Use: No  . Drug Use: No  . Sexual Activity: Not on file   Other Topics Concern  . Not on file   Social History Narrative     Filed Vitals:   11/04/14 0920  BP: 113/70  Pulse: 59  Height: 5\' 6"  (1.676 m)  Weight: 195 lb (88.451 kg)    PHYSICAL EXAM General: NAD HEENT: Normal. Neck: No JVD, no thyromegaly. Lungs: Clear to auscultation bilaterally with normal respiratory effort. CV: Nondisplaced PMI.  Regular rate and rhythm, normal S1/S2, no S3/S4, no murmur. No pretibial or periankle edema.  No carotid bruit.  Normal pedal pulses.  Abdomen: Soft, nontender, no hepatosplenomegaly, no distention.  Neurologic: Alert and oriented x 3.  Psych: Normal affect. Skin: Normal. Musculoskeletal: Normal range of motion, no gross deformities. Extremities: No clubbing or cyanosis.   ECG: Most recent ECG reviewed.      ASSESSMENT AND PLAN: 1. CAD: Stable ischemic heart disease. I will  continue aspirin 81 mg and Coreg. He is intolerant to statin therapy. Given the severity and complexity of his RCA disease, I will continue Plavix 75 mg daily.  2. Essential HTN: Controlled. No changes.  3. Hyperlipidemia: Intolerant to statin therapy. 03/18/14 HDL 30, LDL 81 mg. Continue Zetia 10 mg.  4. Thoracic aortic aneurysm: Followed by [facility].  5. Type 2 diabetes: Relatively well controlled on metformin 500 mg twice daily  6. Carotid artery stenosis: Carotid Dopplers in October 2015 demonstrated 1-39% bilateral carotid artery stenosis. Continue aspirin and Plavix. Intolerant to statins.  Dispo: f/u 6 months.   [name], M.D., F.A.C.C.

## 2014-11-07 ENCOUNTER — Other Ambulatory Visit: Payer: Self-pay | Admitting: *Deleted

## 2014-11-07 MED ORDER — AMLODIPINE BESY-BENAZEPRIL HCL 5-20 MG PO CAPS: 1.0000 | ORAL_CAPSULE | Freq: Every day | ORAL | Status: DC

## 2014-11-07 NOTE — Telephone Encounter (Signed)
Amlodipine / Benazepril refilled today for 90 day to [facility].

## 2015-01-27 ENCOUNTER — Ambulatory Visit (INDEPENDENT_AMBULATORY_CARE_PROVIDER_SITE_OTHER): Payer: Medicare Other | Admitting: Pediatrics

## 2015-01-27 ENCOUNTER — Encounter: Payer: Self-pay | Admitting: Pediatrics

## 2015-01-27 VITALS — BP 118/74 | HR 55 | Temp 97.2°F | Ht 66.0 in | Wt 192.6 lb

## 2015-01-27 DIAGNOSIS — I6523 Occlusion and stenosis of bilateral carotid arteries: Secondary | ICD-10-CM | POA: Diagnosis not present

## 2015-01-27 DIAGNOSIS — M2560 Stiffness of unspecified joint, not elsewhere classified: Secondary | ICD-10-CM | POA: Diagnosis not present

## 2015-01-27 DIAGNOSIS — E785 Hyperlipidemia, unspecified: Secondary | ICD-10-CM | POA: Diagnosis not present

## 2015-01-27 DIAGNOSIS — M25519 Pain in unspecified shoulder: Secondary | ICD-10-CM | POA: Diagnosis not present

## 2015-01-27 DIAGNOSIS — E119 Type 2 diabetes mellitus without complications: Secondary | ICD-10-CM | POA: Diagnosis not present

## 2015-01-27 DIAGNOSIS — I2510 Atherosclerotic heart disease of native coronary artery without angina pectoris: Secondary | ICD-10-CM | POA: Diagnosis not present

## 2015-01-27 DIAGNOSIS — I10 Essential (primary) hypertension: Secondary | ICD-10-CM | POA: Diagnosis not present

## 2015-01-27 LAB — POCT GLYCOSYLATED HEMOGLOBIN (HGB A1C): Hemoglobin A1C: 6.4

## 2015-01-27 NOTE — Patient Instructions (Addendum)
--  continue shoulder exercises  --Physical therapy for shoulders

## 2015-01-27 NOTE — Progress Notes (Signed)
  Subjective:    Patient ID: Eduardo [name], male    DOB: 06/15/1945, 69 y.o.   MRN: [id number]  HPI: Eduardo [name] is a 69 y.o. male presenting on 01/27/2015 for Follow-up  He has had worsening right thumb pain that is similar to pain that he had in his left thumb several years ago. Thumb has pain with any range of motion. He says he completed golf just fine because he does not need to move his trauma. He is left-handed though primarily writes with his right hand. He had surgery on his left thumb when he lived in [place] because of worsening osteoarthritis and he says this pain in his right thumb is similar. He has not noticed any redness or swelling in the thumb. The pain does not come and go but is there constantly throughout the day. This is been ongoing for several weeks to months.  He is on metformin alone for his diabetes. He had an eye exam within the last year for his diabetes.   He has a history of exposure to Agent Orange in [place] and attributes his neuropathy to that in his lower extremities.  He denies any chest pain, shortness of breath.  He has had worsening bilateral shoulder pain. He has had rotator cuff repairs on both shoulders per patient.  Relevant past medical, surgical, family and social history reviewed and updated as indicated. Interim medical history since our last visit reviewed. Allergies and medications reviewed and updated.  Review of Systems  Constitutional: Negative for fever, chills and malaise/fatigue.  Eyes: Negative for blurred vision, double vision and pain.  Respiratory: Negative.   Cardiovascular: Negative for chest pain, palpitations, orthopnea and leg swelling.  Gastrointestinal: Negative for nausea, vomiting, diarrhea, constipation and blood in stool.  Genitourinary: Negative.   Skin: Negative for rash.  Neurological: Negative for focal weakness and weakness.    ROS: Per HPI unless specifically indicated above   Current  Outpatient Prescriptions  Medication Sig Dispense Refill  . albuterol (PROVENTIL HFA;VENTOLIN HFA) 108 (90 BASE) MCG/ACT inhaler Inhale 1 puff into the lungs every 6 (six) hours as needed for wheezing or shortness of breath.     . amLODipine-benazepril (LOTREL) 5-20 MG per capsule Take 1 capsule by mouth daily. 90 capsule 3  . aspirin EC 81 MG tablet Take 81 mg by mouth daily.     . carvedilol (COREG) 6.25 MG tablet Take 6.25 mg by mouth 2 (two) times daily with a meal.    . clopidogrel (PLAVIX) 75 MG tablet Take 1 tablet (75 mg total) by mouth daily. 90 tablet 3  . ezetimibe (ZETIA) 10 MG tablet Take 10 mg by mouth daily.    . fenofibrate micronized (LOFIBRA) 134 MG capsule TAKE 1 CAPSULE DAILY BEFORE BREAKFAST 30 capsule 0  . meloxicam (MOBIC) 15 MG tablet TAKE 1 TABLET DAILY 30 tablet 0  . metFORMIN (GLUCOPHAGE) 1000 MG tablet Take 1,000 mg by mouth 2 (two) times daily with a meal.    . nitroGLYCERIN (NITROSTAT) 0.4 MG SL tablet Place 1 tablet (0.4 mg total) under the tongue every 5 (five) minutes x 3 doses as needed for chest pain. 75 tablet 1  . sertraline (ZOLOFT) 100 MG tablet Take 100 mg by mouth daily.    . tiotropium (SPIRIVA) 18 MCG inhalation capsule Place 18 mcg into inhaler and inhale daily.     No current facility-administered medications for this visit.       Objective:    BP 118/74 mmHg    Pulse 55  Temp(Src) 97.2 F (36.2 C) (Oral)  Ht 5' 6" (1.676 m)  Wt 192 lb 9.6 oz (87.363 kg)  BMI 31.10 kg/m2  Wt Readings from Last 3 Encounters:  01/27/15 192 lb 9.6 oz (87.363 kg)  11/04/14 195 lb (88.451 kg)  07/08/14 199 lb (90.266 kg)     Gen: NAD, alert, cooperative with exam, NCAT EYES: EOMI, no scleral injection or icterus ENT:  OP without erythema LYMPH: no cervical LAD CV: NRRR, normal S1/S2, no murmur, DP pulses 2+ b/l Resp: CTABL, no wheezes, normal WOB Abd: +BS, soft, NTND. no guarding or organomegaly Ext: No edema, warm Neuro: Alert and oriented, strength  equal b/l UE and LE, coordination grossly normal MSK: normal muscle bulk, R MCP with no redness or swelling. Pain with all ROM, active or passive. Rotator cuff muscles intact b/l. Pain with internal rotation b/l shoulders, decreased internal rotation b/l. Positive Neers test b/l.      Assessment & Plan:   Bonny was seen today for follow-up of multiple medical problems. Also with b/l shoulder pain that he is interested in physical therapy for and for R MCP pain that he is worried is OA similar to what he had in his L hand. Will refer to hand orthopedics.  Diagnoses and all orders for this visit:  Type 2 diabetes mellitus without complication Checking HgA1c today. On metformin.  -     POCT glycosylated hemoglobin (Hb A1C)  Hyperlipidemia -     Lipid panel  Coronary artery disease involving native coronary artery of native heart without angina pectoris No recent symptoms of chest pain, dyspnea. On BB, plavix, ASA, ACE-i.  Essential hypertension Well controlled today, continue current meds. Continue current meds. -     BMP8+EGFR  Pain in joint, shoulder region, unspecified laterality -     Ambulatory referral to Physical Therapy   Follow up plan: 3 months  Carol Vincent, MD [facility] 01/27/2015, 5:32 PM   

## 2015-01-27 NOTE — Assessment & Plan Note (Signed)
Checking HgA1c today. On metformin.

## 2015-01-28 LAB — BMP8+EGFR
BUN / CREAT RATIO: 27 — AB (ref 10–22)
BUN: 17 mg/dL (ref 8–27)
CO2: 21 mmol/L (ref 18–29)
CREATININE: 0.63 mg/dL — AB (ref 0.76–1.27)
Calcium: 9.3 mg/dL (ref 8.6–10.2)
Chloride: 98 mmol/L (ref 97–108)
GFR calc Af Amer: 115 mL/min/{1.73_m2} (ref >59)
GFR, EST NON AFRICAN AMERICAN: 100 mL/min/{1.73_m2} (ref >59)
Glucose: 114 mg/dL — ABNORMAL HIGH (ref 65–99)
POTASSIUM: 4.8 mmol/L (ref 3.5–5.2)
SODIUM: 139 mmol/L (ref 134–144)

## 2015-01-28 LAB — LIPID PANEL
CHOLESTEROL TOTAL: 132 mg/dL (ref 100–199)
Chol/HDL Ratio: 3.7 ratio units (ref 0.0–5.0)
HDL: 36 mg/dL — AB (ref >39)
LDL CALC: 57 mg/dL (ref 0–99)
Triglycerides: 194 mg/dL — ABNORMAL HIGH (ref 0–149)
VLDL CHOLESTEROL CAL: 39 mg/dL (ref 5–40)

## 2015-02-19 ENCOUNTER — Other Ambulatory Visit: Payer: Self-pay | Admitting: Cardiovascular Disease

## 2015-03-09 ENCOUNTER — Ambulatory Visit (INDEPENDENT_AMBULATORY_CARE_PROVIDER_SITE_OTHER): Payer: Medicare Other | Admitting: *Deleted

## 2015-03-09 DIAGNOSIS — Z23 Encounter for immunization: Secondary | ICD-10-CM | POA: Diagnosis not present

## 2015-03-10 DIAGNOSIS — M1811 Unilateral primary osteoarthritis of first carpometacarpal joint, right hand: Secondary | ICD-10-CM | POA: Diagnosis not present

## 2015-03-10 DIAGNOSIS — M1812 Unilateral primary osteoarthritis of first carpometacarpal joint, left hand: Secondary | ICD-10-CM | POA: Diagnosis not present

## 2015-03-12 ENCOUNTER — Ambulatory Visit: Payer: Medicare Other | Attending: Pediatrics | Admitting: Physical Therapy

## 2015-03-12 DIAGNOSIS — M25512 Pain in left shoulder: Secondary | ICD-10-CM

## 2015-03-12 DIAGNOSIS — M25511 Pain in right shoulder: Secondary | ICD-10-CM | POA: Diagnosis not present

## 2015-03-12 NOTE — Therapy (Signed)
[facility] [place], [place], [address] Phone: [phone]   Fax:  [phone]  Physical Therapy Evaluation  Patient Details  Name: Eduardo [name] MRN: [id number] Date of Birth: 1946/01/11 Referring Provider:  [name], MD  Encounter Date: 03/12/2015      PT End of Session - 03/12/15 1052    Visit Number 1   Number of Visits 18   Date for PT Re-Evaluation 04/30/15   PT Start Time 1028   PT Stop Time 1115   PT Time Calculation (min) 47 min   Activity Tolerance Patient tolerated treatment well   Behavior During Therapy [facility] for tasks assessed/performed      Past Medical History  Diagnosis Date  . COPD (chronic obstructive pulmonary disease)   . CAD (coronary artery disease)   . Diabetes mellitus without complication   . Hypertension   . Neuromuscular disorder   . Sleep apnea   . Chemical exposure     agent orange   . PTSD (post-traumatic stress disorder)   . Diverticulitis   . Shortness of breath   . Unstable angina     Past Surgical History  Procedure Laterality Date  . Spine surgery    . Cardiac catheterization    . Cholecystectomy    . Shoulder surgery    . Knee surgery      right   . Ptca      unsuccesful  . Cervical spine surgery    . I&d extremity Left 05/30/2013    Procedure: IRRIGATION AND DEBRIDEMENT Left Hand and Foreign body removal;  Surgeon: [name], MD;  Location: [place];  Service: Orthopedics;  Laterality: Left;  [name] Eye surgery Right     cataract  . Cardiac catheterization  03/18/14    difficult to determine culprit vesel  . Nm myoview ltd  03/19/14    mild inf. ischemia  . Left heart catheterization with coronary angiogram N/A 03/18/2014    Procedure: LEFT HEART CATHETERIZATION WITH CORONARY ANGIOGRAM;  Surgeon: [name], MD;  Location: [facility] CATH LAB;  Service: Cardiovascular;  Laterality: N/A;    There were no vitals filed for this visit.  Visit Diagnosis:   Right shoulder pain - Plan: PT plan of care cert/re-cert  Left shoulder pain - Plan: PT plan of care cert/re-cert  Bilateral shoulder pain - Plan: PT plan of care cert/re-cert      Subjective Assessment - 03/12/15 1031    Subjective Tender over my ligaments.   Limitations Lifting   Patient Stated Goals Get out of pain.   Currently in Pain? Yes   Pain Score 3    Pain Location Shoulder   Pain Orientation Left;Right   Pain Descriptors / Indicators Aching   Pain Frequency Constant            [facility] PT Assessment - 03/12/15 0001    Assessment   Medical Diagnosis Bilateral shoulder pain.   Onset Date/Surgical Date --  Ongoing.   Precautions   Precautions None   Restrictions   Weight Bearing Restrictions No   Balance Screen   Has the patient fallen in the past 6 months No   Has the patient had a decrease in activity level because of a fear of falling?  No   Is the patient reluctant to leave their home because of a fear of falling?  No   Home Environment   Living Environment Private residence   Prior Function   Level of  Independence Independent   Cognition   Overall Cognitive Status Within Functional Limits for tasks assessed   Posture/Postural Control   Posture/Postural Control No significant limitations   ROM / Strength   AROM / PROM / Strength AROM;Strength   AROM   Overall AROM Comments Bilateral active shoulder ROMis WNL.   Strength   Overall Strength Comments Bilateral shoulder strength= 4+/5.   Palpation   Palpation comment Tender to palpation over left bicipital groove.  Left shoulder in same area but to a much lesser degree.   Special Tests    Special Tests --  Positive left shoulder Speed's and Impingement tests.                   [facility] Adult PT Treatment/Exercise - 2015-03-26 0001    Modalities   Modalities Electrical Stimulation   Electrical Stimulation   Electrical Stimulation Location Left anterior shoulder.   Electrical Stimulation Action  80-150 HZ x 15 minutes constant pre-mod E'stim                   PT Short Term Goals - 2015/03/26 1059    PT SHORT TERM GOAL #1   Title Ind with initial HEP.   Time 2   Period Weeks   Status New           PT Long Term Goals - 03-26-2015 1059    PT LONG TERM GOAL #1   Title Ind with advanced HEP.   Time 8   Period Weeks   Status New   PT LONG TERM GOAL #2   Title 5/5 bilateral strength to increase stability for functional tasks.   Time 8   Period Weeks   Status New   PT LONG TERM GOAL #3   Title Perform ADL's with pain not > 3/10.   Time 8   Status New   PT LONG TERM GOAL #4   Title Lower object weighing 5# from above shoulder height with left shoulder and pain not > 3/10.   Time 8   Period Weeks   Status New               Plan - 03-26-2015 1035    Clinical Impression Statement The patient has had ongoing bilateral shoulder pain.  His pain is a 3/10 at rest.  His left shoulder pain rises to nearly a 10/10 when lowering objects from above shoulder height.   Pt will benefit from skilled therapeutic intervention in order to improve on the following deficits Pain;Decreased activity tolerance   Rehab Potential Excellent   PT Frequency 3x / week   PT Duration 6 weeks   PT Treatment/Interventions ADLs/Self Care Home Management;Electrical Stimulation;Moist Heat;Therapeutic exercise;Therapeutic activities;Ultrasound;Patient/family education;Manual techniques   PT Next Visit Plan Combo e'stim to patient left anterior and acromial ridge region; bilateral shoulder RW4; full can; [facility].          G-Codes - 03-26-15 1057    Functional Assessment Tool Used FOTO.   Functional Limitation Mobility: Walking and moving around   Mobility: Walking and Moving Around Current Status [phone]) At least 20 percent but less than 40 percent impaired, limited or restricted   Mobility: Walking and Moving Around Goal Status [phone]) At least 1 percent but less than 20 percent  impaired, limited or restricted       Problem List Patient Active Problem List   Diagnosis Date Noted  . Unstable angina ([place]) 03/18/2014  . Angina pectoris ([place]) 03/17/2014  . Diabetes (  [place]) 09/21/2013  . COPD (chronic obstructive pulmonary disease) ([place]) 09/21/2013  . PTSD (post-traumatic stress disorder) 09/21/2013  . Infected hand 05/29/2013  . CAD (coronary artery disease) 04/16/2013  . HTN (hypertension) 04/16/2013  . Hyperlipidemia 04/16/2013    Annlouise Gerety, [place] MPT 03/12/2015, 11:25 AM  [facility] [address] [place], [place], [address] Phone: [phone]   Fax:  [phone]

## 2015-03-17 ENCOUNTER — Ambulatory Visit: Payer: Medicare Other | Admitting: Physical Therapy

## 2015-03-17 ENCOUNTER — Encounter: Payer: Self-pay | Admitting: Physical Therapy

## 2015-03-17 DIAGNOSIS — M25511 Pain in right shoulder: Secondary | ICD-10-CM

## 2015-03-17 DIAGNOSIS — M25512 Pain in left shoulder: Secondary | ICD-10-CM

## 2015-03-17 NOTE — Therapy (Signed)
[facility] [place], [place], [address] Phone: [phone]   Fax:  [phone]  Physical Therapy Treatment  Patient Details  Name: Eduardo [name] MRN: [id number] Date of Birth: 1946-04-29 Referring Provider: Dr. Evette Doffing  Encounter Date: 03/17/2015      PT End of Session - 03/17/15 0755    Visit Number 2   Number of Visits 18   Date for PT Re-Evaluation 04/30/15   PT Start Time 0734   PT Stop Time 0815   PT Time Calculation (min) 41 min   Activity Tolerance Patient tolerated treatment well   Behavior During Therapy [facility] for tasks assessed/performed      Past Medical History  Diagnosis Date  . COPD (chronic obstructive pulmonary disease) ([place])   . CAD (coronary artery disease)   . Diabetes mellitus without complication ([place])   . Hypertension   . Neuromuscular disorder ([place])   . Sleep apnea   . Chemical exposure     agent orange   . PTSD (post-traumatic stress disorder)   . Diverticulitis   . Shortness of breath   . Unstable angina [facility])     Past Surgical History  Procedure Laterality Date  . Spine surgery    . Cardiac catheterization    . Cholecystectomy    . Shoulder surgery    . Knee surgery      right   . Ptca      unsuccesful  . Cervical spine surgery    . I&d extremity Left 05/30/2013    Procedure: IRRIGATION AND DEBRIDEMENT Left Hand and Foreign body removal;  Surgeon: [name], MD;  Location: [place];  Service: Orthopedics;  Laterality: Left;  [name] Eye surgery Right     cataract  . Cardiac catheterization  03/18/14    difficult to determine culprit vesel  . Nm myoview ltd  03/19/14    mild inf. ischemia  . Left heart catheterization with coronary angiogram N/A 03/18/2014    Procedure: LEFT HEART CATHETERIZATION WITH CORONARY ANGIOGRAM;  Surgeon: [name], MD;  Location: [facility] CATH LAB;  Service: Cardiovascular;  Laterality: N/A;    There were no vitals filed for this  visit.  Visit Diagnosis:  Right shoulder pain  Left shoulder pain  Bilateral shoulder pain      Subjective Assessment - 03/17/15 0732    Subjective Reports that neither shoulder feels good. Reports he has weight system at home but cardiologist just made him start using lower weights.   Limitations Lifting   Patient Stated Goals Get out of pain.   Currently in Pain? Yes   Pain Score 3    Pain Location Shoulder   Pain Orientation Right   Multiple Pain Sites Yes   Pain Score 6   Pain Location Shoulder   Pain Orientation Left            [facility] PT Assessment - 03/17/15 0001    Assessment   Medical Diagnosis Bilateral shoulder pain.   Referring Provider Dr. Bronson Ing Adult PT Treatment/Exercise - 03/17/15 0001    Exercises   Exercises Shoulder   Shoulder Exercises: Supine   Protraction Strengthening;Both;20 reps;Weights   Protraction Weight (lbs) 2   Shoulder Exercises: Prone   Extension Strengthening;Both;20 reps;Weights   Extension Weight (lbs) 2   Extension Limitations Bent   Other Prone Exercises Bent B rows 2# x20  reps each   Shoulder Exercises: Sidelying   External Rotation Strengthening;Both;20 reps;Weights   External Rotation Weight (lbs) 2   Flexion Strengthening;Both;20 reps;Weights   Flexion Weight (lbs) 1   Shoulder Exercises: Standing   Protraction Strengthening;Both;20 reps;Theraband   Theraband Level (Shoulder Protraction) Level 1 (Yellow)   External Rotation Strengthening;Both;20 reps;Theraband   Theraband Level (Shoulder External Rotation) Level 1 (Yellow)   Internal Rotation Strengthening;Both;20 reps;Theraband   Theraband Level (Shoulder Internal Rotation) Level 1 (Yellow)   Row Strengthening;Both;20 reps;Theraband   Theraband Level (Shoulder Row) Level 1 (Yellow)   Other Standing Exercises B full can 2# x20 reps   Shoulder Exercises: ROM/Strengthening   UBE (Upper Arm Bike) 120 RPM x5 min   Modalities    Modalities Ultrasound   Ultrasound   Ultrasound Location B ant. shoulder   Ultrasound Parameters Combo 1.5 w/cm2, 100%, 1 mhz x6 min   Ultrasound Goals Pain                  PT Short Term Goals - 03/12/15 1059    PT SHORT TERM GOAL #1   Title Ind with initial HEP.   Time 2   Period Weeks   Status New           PT Long Term Goals - 03/12/15 1059    PT LONG TERM GOAL #1   Title Ind with advanced HEP.   Time 8   Period Weeks   Status New   PT LONG TERM GOAL #2   Title 5/5 bilateral strength to increase stability for functional tasks.   Time 8   Period Weeks   Status New   PT LONG TERM GOAL #3   Title Perform ADL's with pain not > 3/10.   Time 8   Status New   PT LONG TERM GOAL #4   Title Lower object weighing 5# from above shoulder height with left shoulder and pain not > 3/10.   Time 8   Period Weeks   Status New               Plan - 03/17/15 2458    Clinical Impression Statement The patient tolerated today's treatment well today and had no complaints of verbalized pain other than during full can. Normal modalities response noted following end of modality session. Patient completed all exercises well and with minimal multimodal cueing for proper exercise technique. R shoulder popping was noted during R shoulder full can and not present with L shoulder full can. Following today's session patient experienced L shoulder tenderness but denied R shoulder tenderness.   Pt will benefit from skilled therapeutic intervention in order to improve on the following deficits Pain;Decreased activity tolerance   Rehab Potential Excellent   PT Frequency 3x / week   PT Duration 6 weeks   PT Treatment/Interventions ADLs/Self Care Home Management;Electrical Stimulation;Moist Heat;Therapeutic exercise;Therapeutic activities;Ultrasound;Patient/family education;Manual techniques   PT Next Visit Plan Combo e'stim to patient left anterior and acromial ridge region; bilateral  shoulder RW4; full can; [facility] ER.   Consulted and Agree with Plan of Care Patient        Problem List Patient Active Problem List   Diagnosis Date Noted  . Unstable angina ([place]) 03/18/2014  . Angina pectoris ([place]) 03/17/2014  . Diabetes ([place]) 09/21/2013  . COPD (chronic obstructive pulmonary disease) ([place]) 09/21/2013  . PTSD (post-traumatic stress disorder) 09/21/2013  . Infected hand 05/29/2013  . CAD (coronary artery disease) 04/16/2013  . HTN (hypertension) 04/16/2013  . Hyperlipidemia  04/16/2013    Wynelle Fanny, PTA 03/17/2015, 8:41 AM  [facility] [address] [place], [place], [address] Phone: [phone]   Fax:  [phone]  Name: Nakia Koble MRN: [id number] Date of Birth: 1946/03/26

## 2015-03-19 ENCOUNTER — Encounter: Payer: TRICARE For Life (TFL) | Admitting: Physical Therapy

## 2015-03-26 ENCOUNTER — Ambulatory Visit: Payer: Medicare Other | Admitting: Physical Therapy

## 2015-03-26 DIAGNOSIS — M25511 Pain in right shoulder: Secondary | ICD-10-CM

## 2015-03-26 DIAGNOSIS — M25512 Pain in left shoulder: Secondary | ICD-10-CM | POA: Diagnosis not present

## 2015-03-26 NOTE — Therapy (Signed)
Eduardo [name], [place], [address] Phone: [phone]   Fax:  [phone]  Physical Therapy Treatment  Patient Details  Name: Eduardo [name] MRN: [id number] Date of Birth: January 26, 1946 Referring Provider: Dr. Evette Doffing  Encounter Date: 03/26/2015      PT End of Session - 03/26/15 0817    Visit Number 3   Number of Visits 18   Date for PT Re-Evaluation 04/30/15   PT Start Time 0816   PT Stop Time 0902   PT Time Calculation (min) 46 min   Activity Tolerance Patient tolerated treatment well   Behavior During Therapy [facility] for tasks assessed/performed      Past Medical History  Diagnosis Date  . COPD (chronic obstructive pulmonary disease) ([place])   . CAD (coronary artery disease)   . Diabetes mellitus without complication ([place])   . Hypertension   . Neuromuscular disorder ([place])   . Sleep apnea   . Chemical exposure     agent orange   . PTSD (post-traumatic stress disorder)   . Diverticulitis   . Shortness of breath   . Unstable angina [facility])     Past Surgical History  Procedure Laterality Date  . Spine surgery    . Cardiac catheterization    . Cholecystectomy    . Shoulder surgery    . Knee surgery      right   . Ptca      unsuccesful  . Cervical spine surgery    . I&d extremity Left 05/30/2013    Procedure: IRRIGATION AND DEBRIDEMENT Left Hand and Foreign body removal;  Surgeon: [name], MD;  Location: [place];  Service: Orthopedics;  Laterality: Left;  [name] Eye surgery Right     cataract  . Cardiac catheterization  03/18/14    difficult to determine culprit vesel  . Nm myoview ltd  03/19/14    mild inf. ischemia  . Left heart catheterization with coronary angiogram N/A 03/18/2014    Procedure: LEFT HEART CATHETERIZATION WITH CORONARY ANGIOGRAM;  Surgeon: [name], MD;  Location: [facility] CATH LAB;  Service: Cardiovascular;  Laterality: N/A;    There were no vitals filed for this  visit.  Visit Diagnosis:  Bilateral shoulder pain      Subjective Assessment - 03/26/15 0819    Subjective They're always sore.   Patient Stated Goals Get out of pain.   Currently in Pain? Yes   Pain Score 3    Pain Location Shoulder   Pain Orientation Right;Left   Pain Descriptors / Indicators Aching   Pain Frequency Constant                         [facility] Adult PT Treatment/Exercise - 03/26/15 0001    Shoulder Exercises: Supine   Protraction Strengthening;Both;20 reps;Weights   Protraction Weight (lbs) 2   Other Supine Exercises D2 extension x 20   Shoulder Exercises: Prone   Extension Strengthening;Both;20 reps;Weights   Extension Weight (lbs) 2   Horizontal ABduction 1 Strengthening;Both;20 reps;Weights   Horizontal ABduction 1 Weight (lbs) 2   Shoulder Exercises: Sidelying   External Rotation Strengthening;Both;20 reps;Weights   External Rotation Weight (lbs) 3   Flexion Strengthening;Both;20 reps;Weights   Flexion Weight (lbs) 2   Shoulder Exercises: Standing   Protraction Strengthening;Both;20 reps;Theraband   Theraband Level (Shoulder Protraction) Level 2 (Red)  and yellow combined   External Rotation Strengthening;Both;20 reps;Theraband   Theraband Level (Shoulder External Rotation) Level 2 (Red)  Internal Rotation Strengthening;Both;20 reps;Theraband   Theraband Level (Shoulder Internal Rotation) Level 2 (Red)  and yellow combined   Row Strengthening;Both;20 reps;Theraband   Theraband Level (Shoulder Row) Level 2 (Red)  and yellow combined   Other Standing Exercises B full can 2# x20 reps   Modalities   Modalities Ultrasound   Ultrasound   Ultrasound Location B ant shoulder   Ultrasound Parameters Combo 1.5wcm2 1 mhz cont x 10 min   Ultrasound Goals Pain                  PT Short Term Goals - 03/12/15 1059    PT SHORT TERM GOAL #1   Title Ind with initial HEP.   Time 2   Period Weeks   Status New           PT Long  Term Goals - 03/12/15 1059    PT LONG TERM GOAL #1   Title Ind with advanced HEP.   Time 8   Period Weeks   Status New   PT LONG TERM GOAL #2   Title 5/5 bilateral strength to increase stability for functional tasks.   Time 8   Period Weeks   Status New   PT LONG TERM GOAL #3   Title Perform ADL's with pain not > 3/10.   Time 8   Status New   PT LONG TERM GOAL #4   Title Lower object weighing 5# from above shoulder height with left shoulder and pain not > 3/10.   Time 8   Period Weeks   Status New               Plan - 03/26/15 1022    Clinical Impression Statement Patient tolerated treatment well today with no pain reported except with horizontal ABD in prone lying. Goals are ongoing. May benefit from iontophoresis if no positive response from combo.   Pt will benefit from skilled therapeutic intervention in order to improve on the following deficits Pain;Decreased activity tolerance   Rehab Potential Excellent   PT Frequency 3x / week   PT Duration 6 weeks   PT Treatment/Interventions ADLs/Self Care Home Management;Electrical Stimulation;Moist Heat;Therapeutic exercise;Therapeutic activities;Ultrasound;Patient/family education;Manual techniques   PT Next Visit Plan continue strengthening, combo; add manual to bil biceps; consider ionto is no positive response to combo.   Consulted and Agree with Plan of Care Patient        Problem List Patient Active Problem List   Diagnosis Date Noted  . Unstable angina ([place]) 03/18/2014  . Angina pectoris ([place]) 03/17/2014  . Diabetes ([place]) 09/21/2013  . COPD (chronic obstructive pulmonary disease) ([place]) 09/21/2013  . PTSD (post-traumatic stress disorder) 09/21/2013  . Infected hand 05/29/2013  . CAD (coronary artery disease) 04/16/2013  . HTN (hypertension) 04/16/2013  . Hyperlipidemia 04/16/2013    Eduardo [name] PT  03/26/2015, 10:29 AM  [facility] [place], [place], [address] Phone: [phone]   Fax:  [phone]  Name: Eduardo [name] MRN: [id number] Date of Birth: [birth year]

## 2015-03-31 ENCOUNTER — Encounter: Payer: Self-pay | Admitting: Physical Therapy

## 2015-03-31 ENCOUNTER — Ambulatory Visit: Payer: Medicare Other | Attending: Pediatrics | Admitting: Physical Therapy

## 2015-03-31 DIAGNOSIS — M25512 Pain in left shoulder: Secondary | ICD-10-CM | POA: Insufficient documentation

## 2015-03-31 DIAGNOSIS — M25511 Pain in right shoulder: Secondary | ICD-10-CM | POA: Diagnosis not present

## 2015-03-31 NOTE — Therapy (Signed)
[facility] [place], [place], [address] Phone: [phone]   Fax:  [phone]  Physical Therapy Treatment  Patient Details  Name: Eduardo [name] MRN: [id number] Date of Birth: March 19, 1946 Referring Provider: Dr. Evette Doffing  Encounter Date: 03/31/2015      PT End of Session - 03/31/15 0816    Visit Number 4   Number of Visits 18   Date for PT Re-Evaluation 04/30/15   PT Start Time 0815   PT Stop Time 0902   PT Time Calculation (min) 47 min   Activity Tolerance Patient tolerated treatment well   Behavior During Therapy [facility] for tasks assessed/performed      Past Medical History  Diagnosis Date  . COPD (chronic obstructive pulmonary disease) ([place])   . CAD (coronary artery disease)   . Diabetes mellitus without complication ([place])   . Hypertension   . Neuromuscular disorder ([place])   . Sleep apnea   . Chemical exposure     agent orange   . PTSD (post-traumatic stress disorder)   . Diverticulitis   . Shortness of breath   . Unstable angina [facility])     Past Surgical History  Procedure Laterality Date  . Spine surgery    . Cardiac catheterization    . Cholecystectomy    . Shoulder surgery    . Knee surgery      right   . Ptca      unsuccesful  . Cervical spine surgery    . I&d extremity Left 05/30/2013    Procedure: IRRIGATION AND DEBRIDEMENT Left Hand and Foreign body removal;  Surgeon: [name], MD;  Location: [place];  Service: Orthopedics;  Laterality: Left;  [name] Eye surgery Right     cataract  . Cardiac catheterization  03/18/14    difficult to determine culprit vesel  . Nm myoview ltd  03/19/14    mild inf. ischemia  . Left heart catheterization with coronary angiogram N/A 03/18/2014    Procedure: LEFT HEART CATHETERIZATION WITH CORONARY ANGIOGRAM;  Surgeon: [name], MD;  Location[facility] CATH LAB;  Service: Cardiovascular;  Laterality: N/A;    There were no vitals filed for this visit.  Visit  Diagnosis:  Bilateral shoulder pain  Right shoulder pain  Left shoulder pain      Subjective Assessment - 03/31/15 0816    Subjective Reports that his shoulders are better.   Limitations Lifting   Patient Stated Goals Get out of pain.   Currently in Pain? Yes   Pain Score 3    Pain Location Shoulder   Pain Orientation Left   Pain Descriptors / Indicators Tender            [facility] PT Assessment - 03/31/15 0001    Assessment   Medical Diagnosis Bilateral shoulder pain.                     [place] Adult PT Treatment/Exercise - 03/31/15 0001    Shoulder Exercises: Supine   Protraction Strengthening;Both;Weights  x30 reps   Protraction Weight (lbs) 2   Other Supine Exercises D2 extension x 30 red theraband   Shoulder Exercises: Prone   Extension Strengthening;Both;Weights  x30 reps   Extension Weight (lbs) 2   Other Prone Exercises Bent B rows 2# x30 reps each   Shoulder Exercises: Sidelying   External Rotation Strengthening;Both;Weights  x30 reps   External Rotation Weight (lbs) 3   Flexion Strengthening;Both;Weights  x30 reps   Flexion Weight (  lbs) 2   Shoulder Exercises: Standing   Protraction Strengthening;Both;Theraband  x30 reps   Theraband Level (Shoulder Protraction) Level 2 (Red)   External Rotation Strengthening;Both;Theraband  x30 rpes   Theraband Level (Shoulder External Rotation) Level 2 (Red)   Internal Rotation Strengthening;Both;Theraband  x30 reps   Theraband Level (Shoulder Internal Rotation) Level 2 (Red)   ABduction Strengthening;Both;Weights  x30 reps   Shoulder ABduction Weight (lbs) 2   Row Strengthening;Both;Theraband  x30 reps   Theraband Level (Shoulder Row) Level 2 (Red)   Other Standing Exercises B full can 2# x30 reps   Shoulder Exercises: ROM/Strengthening   UBE (Upper Arm Bike) 120 RPM x6 min   Modalities   Modalities Ultrasound   Ultrasound   Ultrasound Location B anterior shoulder   Ultrasound Parameters Combo  1.5 w/cm2, 100%, 1 mhz x6 min each   Ultrasound Goals Pain                  PT Short Term Goals - 03/12/15 1059    PT SHORT TERM GOAL #1   Title Ind with initial HEP.   Time 2   Period Weeks   Status New           PT Long Term Goals - 03/31/15 0906    PT LONG TERM GOAL #1   Title Ind with advanced HEP.   Time 8   Period Weeks   Status On-going   PT LONG TERM GOAL #2   Title 5/5 bilateral strength to increase stability for functional tasks.   Time 8   Period Weeks   Status On-going   PT LONG TERM GOAL #3   Title Perform ADL's with pain not > 3/10.   Time 8   Status Achieved   PT LONG TERM GOAL #4   Title Lower object weighing 5# from above shoulder height with left shoulder and pain not > 3/10.   Time 8   Period Weeks   Status On-going               Plan - 03/31/15 0841    Clinical Impression Statement Patient tolerated today's treatment well with no complaints of increased pain of difficulty. Goals remain on-going at this time secondary to decreased strength and lifitng. Achieved ADLs goal today in clinic. Normal combo response noted following end of the combo session. Denied B shoulder pain following today's treatment.   Pt will benefit from skilled therapeutic intervention in order to improve on the following deficits Pain;Decreased activity tolerance   Rehab Potential Excellent   PT Frequency 3x / week   PT Duration 6 weeks   PT Treatment/Interventions ADLs/Self Care Home Management;Electrical Stimulation;Moist Heat;Therapeutic exercise;Therapeutic activities;Ultrasound;Patient/family education;Manual techniques   PT Next Visit Plan continue strengthening, combo; add manual to bil biceps; consider ionto is no positive response to combo. Increase hand weights for strengthening if needed next treatment.   Consulted and Agree with Plan of Care Patient        Problem List Patient Active Problem List   Diagnosis Date Noted  . Unstable angina  ([place]) 03/18/2014  . Angina pectoris ([place]) 03/17/2014  . Diabetes ([place]) 09/21/2013  . COPD (chronic obstructive pulmonary disease) ([place]) 09/21/2013  . PTSD (post-traumatic stress disorder) 09/21/2013  . Infected hand 05/29/2013  . CAD (coronary artery disease) 04/16/2013  . HTN (hypertension) 04/16/2013  . Hyperlipidemia 04/16/2013    Wynelle Fanny, PTA 03/31/2015, 9:09 AM  [facility] [place], [place],  [place] Phone: [phone]   Fax:  [phone]  Name: Tarin Navarez MRN: [id number] Date of Birth: 12/06/1945

## 2015-04-02 ENCOUNTER — Ambulatory Visit: Payer: Medicare Other | Admitting: Physical Therapy

## 2015-04-02 DIAGNOSIS — M25512 Pain in left shoulder: Principal | ICD-10-CM

## 2015-04-02 DIAGNOSIS — M25511 Pain in right shoulder: Secondary | ICD-10-CM | POA: Diagnosis not present

## 2015-04-02 NOTE — Therapy (Signed)
[facility] [place], [place], [address] Phone: [phone]   Fax:  [phone]  Physical Therapy Treatment  Patient Details  Name: Eduardo [name] MRN: [id number] Date of Birth: Apr 22, 1946 Referring Provider: Dr. Evette Doffing  Encounter Date: 04/02/2015      PT End of Session - 04/02/15 0817    Visit Number 5   Number of Visits 18   Date for PT Re-Evaluation 04/30/15   PT Start Time 0815   PT Stop Time 0859   PT Time Calculation (min) 44 min   Activity Tolerance Patient tolerated treatment well   Behavior During Therapy [facility] for tasks assessed/performed      Past Medical History  Diagnosis Date  . COPD (chronic obstructive pulmonary disease) ([place])   . CAD (coronary artery disease)   . Diabetes mellitus without complication ([place])   . Hypertension   . Neuromuscular disorder ([place])   . Sleep apnea   . Chemical exposure     agent orange   . PTSD (post-traumatic stress disorder)   . Diverticulitis   . Shortness of breath   . Unstable angina [facility])     Past Surgical History  Procedure Laterality Date  . Spine surgery    . Cardiac catheterization    . Cholecystectomy    . Shoulder surgery    . Knee surgery      right   . Ptca      unsuccesful  . Cervical spine surgery    . I&d extremity Left 05/30/2013    Procedure: IRRIGATION AND DEBRIDEMENT Left Hand and Foreign body removal;  Surgeon: [name], MD;  Location: [place];  Service: Orthopedics;  Laterality: Left;  [name] Eye surgery Right     cataract  . Cardiac catheterization  03/18/14    difficult to determine culprit vesel  . Nm myoview ltd  03/19/14    mild inf. ischemia  . Left heart catheterization with coronary angiogram N/A 03/18/2014    Procedure: LEFT HEART CATHETERIZATION WITH CORONARY ANGIOGRAM;  Surgeon: [name], MD;  Location: [facility] CATH LAB;  Service: Cardiovascular;  Laterality: N/A;    There were no vitals filed for this visit.  Visit  Diagnosis:  Bilateral shoulder pain      Subjective Assessment - 04/02/15 0818    Subjective I'm sore today. I had to spread a lot of mulch the past two days.   Patient Stated Goals Get out of pain.   Currently in Pain? Yes   Pain Score 5    Pain Location Shoulder   Pain Orientation Left   Pain Descriptors / Indicators Tender   Multiple Pain Sites Yes   Pain Score 4   Pain Location Shoulder   Pain Orientation Right   Pain Descriptors / Indicators Tender                         [facility] Adult PT Treatment/Exercise - 04/02/15 0001    Shoulder Exercises: Supine   Protraction Strengthening;Both;Weights  x30 reps   Protraction Weight (lbs) 10   Other Supine Exercises D2 extension x 30 red theraband   Shoulder Exercises: Prone   Extension Strengthening;Both;Weights  x30 reps   Extension Weight (lbs) 2   Horizontal ABduction 1 Strengthening;Both;20 reps;Weights   Horizontal ABduction 1 Weight (lbs) 2   Other Prone Exercises Bent B rows 10# x30 reps each  patient uses 20# at home   Shoulder Exercises: Sidelying   External Rotation  Strengthening;Both;Weights  x30 reps   External Rotation Weight (lbs) 3   Flexion Strengthening;Both;Weights  x30 reps   Flexion Weight (lbs) 2   Shoulder Exercises: Standing   Protraction Strengthening;Both;Theraband  x30 reps   Theraband Level (Shoulder Protraction) Level 2 (Red)   External Rotation Strengthening;Both;Theraband  x30 rpes   Theraband Level (Shoulder External Rotation) Level 3 (Green)   Internal Rotation Strengthening;Both;Theraband  x30 reps   Theraband Level (Shoulder Internal Rotation) Level 3 (Green)   ABduction Strengthening;Both;Weights  x30 reps   Shoulder ABduction Weight (lbs) 2   Theraband Level (Shoulder Extension) --  double green   Row Strengthening;Both;Theraband  x30 reps   Theraband Level (Shoulder Row) Level 3 (Green)   Other Standing Exercises B full can 2# x30 reps   Modalities    Modalities Ultrasound   Ultrasound   Ultrasound Location B anterior shoulders   Ultrasound Parameters Combo 1.5wcm2 1 mhz cont x 6 min each   Ultrasound Goals Pain                  PT Short Term Goals - 03/12/15 1059    PT SHORT TERM GOAL #1   Title Ind with initial HEP.   Time 2   Period Weeks   Status New           PT Long Term Goals - 03/31/15 0906    PT LONG TERM GOAL #1   Title Ind with advanced HEP.   Time 8   Period Weeks   Status On-going   PT LONG TERM GOAL #2   Title 5/5 bilateral strength to increase stability for functional tasks.   Time 8   Period Weeks   Status On-going   PT LONG TERM GOAL #3   Title Perform ADL's with pain not > 3/10.   Time 8   Status Achieved   PT LONG TERM GOAL #4   Title Lower object weighing 5# from above shoulder height with left shoulder and pain not > 3/10.   Time 8   Period Weeks   Status On-going               Plan - 04/02/15 1603    Clinical Impression Statement Patient reported increased pain at start of treatment but tolerated increased resistance to many of his exericses with no pain and reported the shoulders felt better after treatment.    PT Next Visit Plan continue strengthening and combo; add manual prn; consider ionto is no positive response to combo.   Consulted and Agree with Plan of Care Patient        Problem List Patient Active Problem List   Diagnosis Date Noted  . Unstable angina ([place]) 03/18/2014  . Angina pectoris ([place]) 03/17/2014  . Diabetes ([place]) 09/21/2013  . COPD (chronic obstructive pulmonary disease) ([place]) 09/21/2013  . PTSD (post-traumatic stress disorder) 09/21/2013  . Infected hand 05/29/2013  . CAD (coronary artery disease) 04/16/2013  . HTN (hypertension) 04/16/2013  . Hyperlipidemia 04/16/2013    [name] PT  04/02/2015, 4:06 PM  [facility] [place], [place], [address] Phone: [phone]   Fax:   [phone]  Name: Eduardo [name] MRN: [id number] Date of Birth: 11/18/1945

## 2015-04-06 ENCOUNTER — Encounter: Payer: Self-pay | Admitting: Physical Therapy

## 2015-04-06 ENCOUNTER — Ambulatory Visit: Payer: Medicare Other | Admitting: Physical Therapy

## 2015-04-06 DIAGNOSIS — M25512 Pain in left shoulder: Secondary | ICD-10-CM | POA: Diagnosis not present

## 2015-04-06 DIAGNOSIS — M25511 Pain in right shoulder: Secondary | ICD-10-CM

## 2015-04-06 NOTE — Therapy (Signed)
[name], [place], [address] Phone: [phone]   Fax:  [phone]  Physical Therapy Treatment  Patient Details  Name: Eduardo [name] MRN: [id number] Date of Birth: 1945-09-23 Referring Provider: Dr. Evette Doffing  Encounter Date: 04/06/2015      PT End of Session - 04/06/15 0818    Visit Number 6   Number of Visits 18   Date for PT Re-Evaluation 04/30/15   PT Start Time 0818   PT Stop Time 0906   PT Time Calculation (min) 48 min   Activity Tolerance Patient tolerated treatment well   Behavior During Therapy [facility] for tasks assessed/performed      Past Medical History  Diagnosis Date  . COPD (chronic obstructive pulmonary disease) ([place])   . CAD (coronary artery disease)   . Diabetes mellitus without complication ([place])   . Hypertension   . Neuromuscular disorder ([place])   . Sleep apnea   . Chemical exposure     agent orange   . PTSD (post-traumatic stress disorder)   . Diverticulitis   . Shortness of breath   . Unstable angina [facility])     Past Surgical History  Procedure Laterality Date  . Spine surgery    . Cardiac catheterization    . Cholecystectomy    . Shoulder surgery    . Knee surgery      right   . Ptca      unsuccesful  . Cervical spine surgery    . I&d extremity Left 05/30/2013    Procedure: IRRIGATION AND DEBRIDEMENT Left Hand and Foreign body removal;  Surgeon: [name], MD;  Location: [place];  Service: Orthopedics;  Laterality: Left;  [name] Eye surgery Right     cataract  . Cardiac catheterization  03/18/14    difficult to determine culprit vesel  . Nm myoview ltd  03/19/14    mild inf. ischemia  . Left heart catheterization with coronary angiogram N/A 03/18/2014    Procedure: LEFT HEART CATHETERIZATION WITH CORONARY ANGIOGRAM;  Surgeon: [name], MD;  Location: [facility] CATH LAB;  Service: Cardiovascular;  Laterality: N/A;    There were no vitals filed for this visit.  Visit  Diagnosis:  Bilateral shoulder pain  Right shoulder pain  Left shoulder pain      Subjective Assessment - 04/06/15 0817    Subjective Reports only shoulder tenderness.   Limitations Lifting   Patient Stated Goals Get out of pain.   Currently in Pain? No/denies            [facility] PT Assessment - 04/06/15 0001    Assessment   Medical Diagnosis Bilateral shoulder pain.                     [facility] Adult PT Treatment/Exercise - 04/06/15 0001    Shoulder Exercises: Supine   Protraction Strengthening;Both;Weights  x30 rpes   Protraction Weight (lbs) 10   Other Supine Exercises B D2 extension x 30 red theraband   Shoulder Exercises: Prone   Extension Strengthening;Both;Weights  x30 reps   Extension Weight (lbs) 3   Extension Limitations Bent   Horizontal ABduction 1 Strengthening;Both;Weights  x30 reps    Horizontal ABduction 1 Weight (lbs) 3   Other Prone Exercises Bent B rows 10# x30 reps each   Shoulder Exercises: Sidelying   External Rotation Strengthening;Both;Weights  x30 reps   External Rotation Weight (lbs) 3   Shoulder Exercises: Standing   Protraction --   Theraband  Level (Shoulder Protraction) --   External Rotation Strengthening;Both;Theraband  3x10 reps   Theraband Level (Shoulder External Rotation) Level 3 (Green)   Internal Rotation Strengthening;Both;Theraband  3x10 reps   Theraband Level (Shoulder Internal Rotation) Level 3 (Green)   ABduction Strengthening;Both;Weights  x30 reps   Shoulder ABduction Weight (lbs) 2   Extension Strengthening;Both;Theraband  3x10 reps   Theraband Level (Shoulder Extension) Level 3 (Green)   Row Strengthening;Both;Theraband  3x10 reps   Theraband Level (Shoulder Row) Level 3 (Green)   Other Standing Exercises B full can 2# x30 reps   Shoulder Exercises: ROM/Strengthening   UBE (Upper Arm Bike) 120 RPM x6 min   Modalities   Modalities Ultrasound   Ultrasound   Ultrasound Location B ant.   Ultrasound  Parameters Combo 1.5 w/cm2, 100%, 1 mhz x6 min each   Ultrasound Goals Pain                  PT Short Term Goals - 03/12/15 1059    PT SHORT TERM GOAL #1   Title Ind with initial HEP.   Time 2   Period Weeks   Status New           PT Long Term Goals - 03/31/15 0906    PT LONG TERM GOAL #1   Title Ind with advanced HEP.   Time 8   Period Weeks   Status On-going   PT LONG TERM GOAL #2   Title 5/5 bilateral strength to increase stability for functional tasks.   Time 8   Period Weeks   Status On-going   PT LONG TERM GOAL #3   Title Perform ADL's with pain not > 3/10.   Time 8   Status Achieved   PT LONG TERM GOAL #4   Title Lower object weighing 5# from above shoulder height with left shoulder and pain not > 3/10.   Time 8   Period Weeks   Status On-going               Plan - 04/06/15 6606    Clinical Impression Statement Patient tolerated today's treatment well with no complaints of increased pain during any B shoulder exercises. Tolerated increased resistance to exercises well with no complaints of increased pain or symptoms in B shoulders today. Normal modaliites response noted following removal of the modalities. Verbalized no complaints of pain following today's treatment.   Pt will benefit from skilled therapeutic intervention in order to improve on the following deficits Pain;Decreased activity tolerance   Rehab Potential Excellent   PT Frequency 3x / week   PT Duration 6 weeks   PT Treatment/Interventions ADLs/Self Care Home Management;Electrical Stimulation;Moist Heat;Therapeutic exercise;Therapeutic activities;Ultrasound;Patient/family education;Manual techniques   PT Next Visit Plan continue strengthening and combo; add manual prn; consider ionto is no positive response to combo.   Consulted and Agree with Plan of Care Patient        Problem List Patient Active Problem List   Diagnosis Date Noted  . Unstable angina ([place]) 03/18/2014  .  Angina pectoris ([place]) 03/17/2014  . Diabetes ([place]) 09/21/2013  . COPD (chronic obstructive pulmonary disease) ([place]) 09/21/2013  . PTSD (post-traumatic stress disorder) 09/21/2013  . Infected hand 05/29/2013  . CAD (coronary artery disease) 04/16/2013  . HTN (hypertension) 04/16/2013  . Hyperlipidemia 04/16/2013    Wynelle Fanny, PTA 04/06/2015, 9:16 AM  [facility] [address] [place], [place], [address] Phone: [phone]   Fax:  [phone]  Name: Kayo  Haring MRN: [id number] Date of Birth: July 02, 1945

## 2015-04-28 ENCOUNTER — Ambulatory Visit: Payer: Medicare Other | Admitting: *Deleted

## 2015-04-28 ENCOUNTER — Encounter: Payer: Self-pay | Admitting: Cardiology

## 2015-04-28 ENCOUNTER — Encounter: Payer: Self-pay | Admitting: *Deleted

## 2015-04-28 ENCOUNTER — Encounter (HOSPITAL_COMMUNITY): Payer: Self-pay | Admitting: Physician Assistant

## 2015-04-28 ENCOUNTER — Inpatient Hospital Stay (HOSPITAL_COMMUNITY)
Admission: AD | Admit: 2015-04-28 | Discharge: 2015-05-10 | DRG: 234 | Disposition: A | Discharge status: Home / Self Care | Payer: Medicare Other | Source: Other Acute Inpatient Hospital | Attending: Cardiothoracic Surgery | Admitting: Cardiothoracic Surgery

## 2015-04-28 DIAGNOSIS — I779 Disorder of arteries and arterioles, unspecified: Secondary | ICD-10-CM

## 2015-04-28 DIAGNOSIS — D62 Acute posthemorrhagic anemia: Secondary | ICD-10-CM | POA: Diagnosis not present

## 2015-04-28 DIAGNOSIS — K913 Postprocedural intestinal obstruction: Secondary | ICD-10-CM | POA: Diagnosis present

## 2015-04-28 DIAGNOSIS — I440 Atrioventricular block, first degree: Secondary | ICD-10-CM | POA: Diagnosis present

## 2015-04-28 DIAGNOSIS — Z833 Family history of diabetes mellitus: Secondary | ICD-10-CM

## 2015-04-28 DIAGNOSIS — R0789 Other chest pain: Secondary | ICD-10-CM | POA: Diagnosis present

## 2015-04-28 DIAGNOSIS — R110 Nausea: Secondary | ICD-10-CM | POA: Diagnosis not present

## 2015-04-28 DIAGNOSIS — I25110 Atherosclerotic heart disease of native coronary artery with unstable angina pectoris: Principal | ICD-10-CM | POA: Diagnosis present

## 2015-04-28 DIAGNOSIS — Z888 Allergy status to other drugs, medicaments and biological substances status: Secondary | ICD-10-CM | POA: Diagnosis not present

## 2015-04-28 DIAGNOSIS — I4891 Unspecified atrial fibrillation: Secondary | ICD-10-CM | POA: Diagnosis present

## 2015-04-28 DIAGNOSIS — I2584 Coronary atherosclerosis due to calcified coronary lesion: Secondary | ICD-10-CM | POA: Diagnosis not present

## 2015-04-28 DIAGNOSIS — Z7982 Long term (current) use of aspirin: Secondary | ICD-10-CM

## 2015-04-28 DIAGNOSIS — R079 Chest pain, unspecified: Secondary | ICD-10-CM

## 2015-04-28 DIAGNOSIS — Z951 Presence of aortocoronary bypass graft: Secondary | ICD-10-CM | POA: Insufficient documentation

## 2015-04-28 DIAGNOSIS — R0602 Shortness of breath: Secondary | ICD-10-CM | POA: Diagnosis not present

## 2015-04-28 DIAGNOSIS — E1165 Type 2 diabetes mellitus with hyperglycemia: Secondary | ICD-10-CM | POA: Diagnosis present

## 2015-04-28 DIAGNOSIS — Z9689 Presence of other specified functional implants: Secondary | ICD-10-CM

## 2015-04-28 DIAGNOSIS — J9811 Atelectasis: Secondary | ICD-10-CM | POA: Diagnosis not present

## 2015-04-28 DIAGNOSIS — Z77098 Contact with and (suspected) exposure to other hazardous, chiefly nonmedicinal, chemicals: Secondary | ICD-10-CM | POA: Diagnosis present

## 2015-04-28 DIAGNOSIS — Z79899 Other long term (current) drug therapy: Secondary | ICD-10-CM | POA: Diagnosis not present

## 2015-04-28 DIAGNOSIS — Z7984 Long term (current) use of oral hypoglycemic drugs: Secondary | ICD-10-CM | POA: Diagnosis not present

## 2015-04-28 DIAGNOSIS — I200 Unstable angina: Secondary | ICD-10-CM | POA: Diagnosis not present

## 2015-04-28 DIAGNOSIS — I209 Angina pectoris, unspecified: Secondary | ICD-10-CM | POA: Diagnosis not present

## 2015-04-28 DIAGNOSIS — Z791 Long term (current) use of non-steroidal anti-inflammatories (NSAID): Secondary | ICD-10-CM | POA: Diagnosis not present

## 2015-04-28 DIAGNOSIS — E785 Hyperlipidemia, unspecified: Secondary | ICD-10-CM | POA: Diagnosis not present

## 2015-04-28 DIAGNOSIS — Z96651 Presence of right artificial knee joint: Secondary | ICD-10-CM | POA: Diagnosis not present

## 2015-04-28 DIAGNOSIS — Z8249 Family history of ischemic heart disease and other diseases of the circulatory system: Secondary | ICD-10-CM | POA: Diagnosis not present

## 2015-04-28 DIAGNOSIS — E119 Type 2 diabetes mellitus without complications: Secondary | ICD-10-CM | POA: Diagnosis not present

## 2015-04-28 DIAGNOSIS — E039 Hypothyroidism, unspecified: Secondary | ICD-10-CM | POA: Diagnosis present

## 2015-04-28 DIAGNOSIS — Z9861 Coronary angioplasty status: Secondary | ICD-10-CM | POA: Diagnosis not present

## 2015-04-28 DIAGNOSIS — Z6830 Body mass index (BMI) 30.0-30.9, adult: Secondary | ICD-10-CM

## 2015-04-28 DIAGNOSIS — J449 Chronic obstructive pulmonary disease, unspecified: Secondary | ICD-10-CM | POA: Diagnosis not present

## 2015-04-28 DIAGNOSIS — G4733 Obstructive sleep apnea (adult) (pediatric): Secondary | ICD-10-CM | POA: Diagnosis present

## 2015-04-28 DIAGNOSIS — R944 Abnormal results of kidney function studies: Secondary | ICD-10-CM | POA: Diagnosis not present

## 2015-04-28 DIAGNOSIS — I6523 Occlusion and stenosis of bilateral carotid arteries: Secondary | ICD-10-CM | POA: Diagnosis present

## 2015-04-28 DIAGNOSIS — F4310 Post-traumatic stress disorder, unspecified: Secondary | ICD-10-CM | POA: Diagnosis not present

## 2015-04-28 DIAGNOSIS — Z6828 Body mass index (BMI) 28.0-28.9, adult: Secondary | ICD-10-CM | POA: Insufficient documentation

## 2015-04-28 DIAGNOSIS — M25512 Pain in left shoulder: Secondary | ICD-10-CM

## 2015-04-28 DIAGNOSIS — Z7902 Long term (current) use of antithrombotics/antiplatelets: Secondary | ICD-10-CM | POA: Diagnosis not present

## 2015-04-28 DIAGNOSIS — I2510 Atherosclerotic heart disease of native coronary artery without angina pectoris: Secondary | ICD-10-CM | POA: Diagnosis present

## 2015-04-28 DIAGNOSIS — I739 Peripheral vascular disease, unspecified: Secondary | ICD-10-CM

## 2015-04-28 DIAGNOSIS — E669 Obesity, unspecified: Secondary | ICD-10-CM | POA: Diagnosis present

## 2015-04-28 DIAGNOSIS — I10 Essential (primary) hypertension: Secondary | ICD-10-CM | POA: Diagnosis present

## 2015-04-28 DIAGNOSIS — K567 Ileus, unspecified: Secondary | ICD-10-CM

## 2015-04-28 DIAGNOSIS — R7989 Other specified abnormal findings of blood chemistry: Secondary | ICD-10-CM

## 2015-04-28 DIAGNOSIS — I712 Thoracic aortic aneurysm, without rupture, unspecified: Secondary | ICD-10-CM

## 2015-04-28 DIAGNOSIS — R61 Generalized hyperhidrosis: Secondary | ICD-10-CM | POA: Diagnosis not present

## 2015-04-28 DIAGNOSIS — Z87891 Personal history of nicotine dependence: Secondary | ICD-10-CM | POA: Diagnosis not present

## 2015-04-28 DIAGNOSIS — E1151 Type 2 diabetes mellitus with diabetic peripheral angiopathy without gangrene: Secondary | ICD-10-CM | POA: Diagnosis not present

## 2015-04-28 DIAGNOSIS — Z981 Arthrodesis status: Secondary | ICD-10-CM | POA: Diagnosis not present

## 2015-04-28 DIAGNOSIS — Z9889 Other specified postprocedural states: Secondary | ICD-10-CM

## 2015-04-28 DIAGNOSIS — M25511 Pain in right shoulder: Secondary | ICD-10-CM

## 2015-04-28 HISTORY — DX: Cervical disc disorder, unspecified, unspecified cervical region: M50.90

## 2015-04-28 HISTORY — DX: Peripheral vascular disease, unspecified: I73.9

## 2015-04-28 HISTORY — DX: Obesity, unspecified: E66.9

## 2015-04-28 HISTORY — DX: Unspecified thoracic, thoracolumbar and lumbosacral intervertebral disc disorder: M51.9

## 2015-04-28 HISTORY — DX: Disorder of arteries and arterioles, unspecified: I77.9

## 2015-04-28 LAB — PROTIME-INR
INR: 1.08 (ref 0.00–1.49)
Prothrombin Time: 14.2 seconds (ref 11.6–15.2)

## 2015-04-28 LAB — GLUCOSE, CAPILLARY: GLUCOSE-CAPILLARY: 170 mg/dL — AB (ref 65–99)

## 2015-04-28 LAB — TROPONIN I: Troponin I: <0.03 ng/mL (ref <0.031)

## 2015-04-28 LAB — HEPARIN LEVEL (UNFRACTIONATED): Heparin Unfractionated: <0.1 IU/mL — ABNORMAL LOW (ref 0.30–0.70)

## 2015-04-28 LAB — TSH: TSH: 1.792 u[IU]/mL (ref 0.350–4.500)

## 2015-04-28 LAB — MAGNESIUM: Magnesium: 1.5 mg/dL — ABNORMAL LOW (ref 1.7–2.4)

## 2015-04-28 MED ORDER — ASPIRIN EC 81 MG PO TBEC
81.0000 mg | DELAYED_RELEASE_TABLET | Freq: Every day | ORAL | Status: DC
  Administered 2015-04-29 – 2015-05-03 (×5): 81 mg via ORAL
  Filled 2015-04-28 (×5): qty 1

## 2015-04-28 MED ORDER — ALBUTEROL SULFATE (2.5 MG/3ML) 0.083% IN NEBU
2.5000 mg | INHALATION_SOLUTION | Freq: Four times a day (QID) | RESPIRATORY_TRACT | Status: DC | PRN
Start: 2015-04-28 — End: 2015-05-04

## 2015-04-28 MED ORDER — MELOXICAM 15 MG PO TABS
15.0000 mg | ORAL_TABLET | Freq: Every day | ORAL | Status: DC
  Administered 2015-04-28 – 2015-04-29 (×2): 15 mg via ORAL
  Filled 2015-04-28 (×2): qty 1

## 2015-04-28 MED ORDER — TIOTROPIUM BROMIDE MONOHYDRATE 18 MCG IN CAPS
18.0000 ug | ORAL_CAPSULE | Freq: Every day | RESPIRATORY_TRACT | Status: DC
  Administered 2015-04-29 – 2015-05-10 (×9): 18 ug via RESPIRATORY_TRACT
  Filled 2015-04-28 (×3): qty 5

## 2015-04-28 MED ORDER — ASPIRIN 81 MG PO CHEW
81.0000 mg | CHEWABLE_TABLET | ORAL | Status: AC
  Administered 2015-04-29: 81 mg via ORAL
  Filled 2015-04-28: qty 1

## 2015-04-28 MED ORDER — ASPIRIN EC 81 MG PO TBEC: 81.0000 mg | DELAYED_RELEASE_TABLET | Freq: Every day | ORAL | Status: DC

## 2015-04-28 MED ORDER — SODIUM CHLORIDE 0.9 % WEIGHT BASED INFUSION
3.0000 mL/kg/h | INTRAVENOUS | Status: DC
  Administered 2015-04-29: 3 mL/kg/h via INTRAVENOUS

## 2015-04-28 MED ORDER — SODIUM CHLORIDE 0.9 % IV SOLN: 250.0000 mL | INTRAVENOUS | Status: DC | PRN

## 2015-04-28 MED ORDER — HEPARIN BOLUS VIA INFUSION
2000.0000 [IU] | Freq: Once | INTRAVENOUS | Status: AC
  Administered 2015-04-28: 2000 [IU] via INTRAVENOUS
  Filled 2015-04-28: qty 2000

## 2015-04-28 MED ORDER — ACETAMINOPHEN 325 MG PO TABS: 650.0000 mg | ORAL_TABLET | ORAL | Status: DC | PRN

## 2015-04-28 MED ORDER — ASPIRIN 81 MG PO CHEW
324.0000 mg | CHEWABLE_TABLET | ORAL | Status: AC
  Administered 2015-04-28: 324 mg via ORAL
  Filled 2015-04-28: qty 4

## 2015-04-28 MED ORDER — SERTRALINE HCL 100 MG PO TABS
100.0000 mg | ORAL_TABLET | Freq: Every day | ORAL | Status: DC
  Administered 2015-04-28 – 2015-05-10 (×11): 100 mg via ORAL
  Filled 2015-04-28 (×11): qty 1

## 2015-04-28 MED ORDER — SODIUM CHLORIDE 0.9 % WEIGHT BASED INFUSION
1.0000 mL/kg/h | INTRAVENOUS | Status: DC
  Administered 2015-04-29: 1 mL/kg/h via INTRAVENOUS

## 2015-04-28 MED ORDER — NITROGLYCERIN 0.4 MG SL SUBL: 0.4000 mg | SUBLINGUAL_TABLET | SUBLINGUAL | Status: DC | PRN

## 2015-04-28 MED ORDER — SODIUM CHLORIDE 0.9 % IJ SOLN: 3.0000 mL | INTRAMUSCULAR | Status: DC | PRN

## 2015-04-28 MED ORDER — EZETIMIBE 10 MG PO TABS
10.0000 mg | ORAL_TABLET | Freq: Every day | ORAL | Status: DC
  Administered 2015-04-28 – 2015-05-10 (×11): 10 mg via ORAL
  Filled 2015-04-28 (×11): qty 1

## 2015-04-28 MED ORDER — FENOFIBRATE 160 MG PO TABS
160.0000 mg | ORAL_TABLET | Freq: Every day | ORAL | Status: DC
  Administered 2015-04-28 – 2015-05-10 (×11): 160 mg via ORAL
  Filled 2015-04-28 (×11): qty 1

## 2015-04-28 MED ORDER — ONDANSETRON HCL 4 MG/2ML IJ SOLN: 4.0000 mg | Freq: Four times a day (QID) | INTRAMUSCULAR | Status: DC | PRN

## 2015-04-28 MED ORDER — SODIUM CHLORIDE 0.9 % IJ SOLN
3.0000 mL | Freq: Two times a day (BID) | INTRAMUSCULAR | Status: DC
  Administered 2015-04-29: 3 mL via INTRAVENOUS

## 2015-04-28 MED ORDER — ASPIRIN 300 MG RE SUPP
300.0000 mg | RECTAL | Status: AC
  Filled 2015-04-28: qty 1

## 2015-04-28 MED ORDER — BENAZEPRIL HCL 20 MG PO TABS
20.0000 mg | ORAL_TABLET | Freq: Every day | ORAL | Status: DC
  Administered 2015-04-28 – 2015-05-03 (×6): 20 mg via ORAL
  Filled 2015-04-28 (×8): qty 1

## 2015-04-28 MED ORDER — HEPARIN (PORCINE) IN NACL 100-0.45 UNIT/ML-% IJ SOLN
1600.0000 [IU]/h | INTRAMUSCULAR | Status: DC
Start: 2015-04-28 — End: 2015-04-29
  Administered 2015-04-29: 1600 [IU]/h via INTRAVENOUS
  Filled 2015-04-28: qty 250

## 2015-04-28 MED ORDER — CARVEDILOL 6.25 MG PO TABS: 6.2500 mg | ORAL_TABLET | Freq: Two times a day (BID) | ORAL | Status: DC

## 2015-04-28 MED ORDER — CLOPIDOGREL BISULFATE 75 MG PO TABS
75.0000 mg | ORAL_TABLET | Freq: Every day | ORAL | Status: DC
  Administered 2015-04-28 – 2015-04-29 (×2): 75 mg via ORAL
  Filled 2015-04-28 (×2): qty 1

## 2015-04-28 MED ORDER — AMLODIPINE BESY-BENAZEPRIL HCL 5-20 MG PO CAPS: 1.0000 | ORAL_CAPSULE | Freq: Every day | ORAL | Status: DC

## 2015-04-28 MED ORDER — INSULIN ASPART 100 UNIT/ML ~~LOC~~ SOLN
0.0000 [IU] | Freq: Three times a day (TID) | SUBCUTANEOUS | Status: DC
  Administered 2015-04-29: 2 [IU] via SUBCUTANEOUS
  Administered 2015-04-29: 3 [IU] via SUBCUTANEOUS
  Administered 2015-04-30 (×2): 2 [IU] via SUBCUTANEOUS
  Administered 2015-04-30 – 2015-05-01 (×3): 3 [IU] via SUBCUTANEOUS
  Administered 2015-05-01: 2 [IU] via SUBCUTANEOUS
  Administered 2015-05-02: 3 [IU] via SUBCUTANEOUS
  Administered 2015-05-02 – 2015-05-03 (×2): 2 [IU] via SUBCUTANEOUS
  Administered 2015-05-03 (×2): 3 [IU] via SUBCUTANEOUS

## 2015-04-28 MED ORDER — SODIUM CHLORIDE 0.9 % IJ SOLN
3.0000 mL | Freq: Two times a day (BID) | INTRAMUSCULAR | Status: DC
  Administered 2015-04-30 – 2015-05-02 (×4): 3 mL via INTRAVENOUS
  Administered 2015-05-03: 23:00:00 via INTRAVENOUS
  Administered 2015-05-03: 3 mL via INTRAVENOUS

## 2015-04-28 MED ORDER — AMLODIPINE BESYLATE 5 MG PO TABS
5.0000 mg | ORAL_TABLET | Freq: Every day | ORAL | Status: DC
  Administered 2015-04-28 – 2015-05-03 (×6): 5 mg via ORAL
  Filled 2015-04-28 (×6): qty 1

## 2015-04-28 NOTE — Progress Notes (Signed)
ANTICOAGULATION CONSULT NOTE - Initial Consult  Pharmacy Consult for heparin  Indication: CP/unstable angina  Patient Measurements: Height: 5\' 8"  (172.7 cm) Weight: 186 lb 4.8 oz (84.505 kg) (scale A) IBW/kg (Calculated) : 68.4  Vital Signs: Temp: 98.1 F (36.7 C) (11/29 2109) Temp Source: Oral (11/29 2109) BP: 114/62 mmHg (11/29 2109) Pulse Rate: 62 (11/29 2109)  Labs:  Recent Labs  04/28/15 2049  LABPROT 14.2  INR 1.08  HEPARINUNFRC <0.10*   Assessment: 69yoM transferred from [facility] hospital with CP and unstable angina. Planned LHC on 11/30. He received bolus and was on 1100 units/hr for 6-8 hours prior to HL obtained here. HL undetectable on 1100 units/hr.  Goal of Therapy:  Heparin level 0.3-0.7 units/ml Monitor platelets by anticoagulation protocol: Yes   Plan:  Give 2000 units bolus x 1 Start heparin infusion at 1400 units/hr Check anti-Xa level in 6 hours and daily while on heparin Continue to monitor H&H and platelets  Vincenza Hews, PharmD, BCPS 04/28/2015, 9:46 PM Pager: [phone]

## 2015-04-28 NOTE — H&P (Addendum)
Patient ID: Goeffrey Berchtold MRN: [id number], DOB/AGE: 70-Oct-1947   Admit date: 04/28/2015   Primary Physician: Redge Gainer, MD Primary Cardiologist: Dr. Bronson Ing  Pt. Profile:  Eduardo [name] is a 69 y.o. male with a history of CAD s/p multiple PTCAs in the past, thoracic aortic aneurysm, COPD, DM, and OSA who was transferred to [facility] from [facility] today for chest pain concerning for unstable angina.    Per patient he has had multiple PCTAs but no stents. He does not know how many. They were done in [place]. He was hospitalized at [facility] in October 2015 for chest pain and underwent coronary angiography on 03/18/14 by Dr. Martinique. This demonstrated complex calcified disease in the RCA with a moderate stenosis at the origin of OM 2 and borderline disease in the mid LAD. LV gram showed EF 55-65%, with no significant mitral regurgitation. Chest pain was felt to be somewhat atypical. It appeared that he likely underwent PTCA of the OM 2 in the past. The RCA had heavy calcification, tortuosity, and eccentric plaque. It was felt that PCI of the RCA would be complex due to this marked tortuosity and calcification, for which reason he underwent a functional assessment of his ischemic burden with a nuclear stress test. Nuclear stress testing demonstrated a moderate sized, small intensity defect involving the mid and basal inferior and inferoseptal walls. There was a small amount of reversibility in the mid and basal inferolateral wall which could represent ischemia. However, there was evidence of soft tissue attenuation as well as diaphragmatic attenuation that could also have accounted for variations in radiotracer uptake. It was felt that it would be best to treat him medically. Of note, he is entirely intolerant to statin therapy due to severe myalgia.  Last seen by Dr. Bronson Ing in 10/2014 and felt to be stable from a cardiac standpoint. He was continued on ASA and plavix. He has  a history of thoracic aortic aneurysm followed at the [place]. He saw his cardiothoracic surgeon about 3 weeks ago who did what sounds like a CT scan to monitor aorta. Per patient, the doctor told him he had extensive coronary calcification in his coronary arteries that would require bypass surgery. He had him set up to see a cardiologist and planned for [facility]. He was supposed to go to see the cardiologist this Friday. He has noticed intermittent chest pain for the past couple weeks but was trying to hold out until Friday. However, yesterday he felt like "he over did it" shoveling sand and developed chest pain.  The patient this morning went to outpatient rehabilitation for bilateral shoulder pain that he has been having.  Following rehabilitation he began to have intermittent chest discomfort that occurred on several occasions.  This morning he walked outside and developed acute chest tightness, nausea, diaphoresis and SOB, which is unusual for him and presented to [facility]. He was given SL NTG which helped his chest pain but gave him a headache and shooting pain down his left neck and shoulder. He currently has 2/10 chest tightness. He was started on IV heparin and transferred to [facility] for unstable angina.   LABS AT [facility]: troponin T normal. Creat 0.68 H/H: 14.2/40.8. Everything else WNL  Problem List  Past Medical History  Diagnosis Date  . COPD (chronic obstructive pulmonary disease) ([place])   . CAD (coronary artery disease)   . Diabetes mellitus without complication ([place])   . Hypertension   . Neuromuscular disorder ([place])   . Sleep  apnea   . Chemical exposure     agent orange   . PTSD (post-traumatic stress disorder)   . Diverticulitis     Past Surgical History  Procedure Laterality Date  . Spine surgery    . Cardiac catheterization    . Cholecystectomy    . Shoulder surgery    . Knee surgery      right   . Ptca      unsuccesful  . Cervical spine surgery    . I&d extremity Left  05/30/2013    Procedure: IRRIGATION AND DEBRIDEMENT Left Hand and Foreign body removal;  Surgeon: [name], MD;  Location: [place];  Service: Orthopedics;  Laterality: Left;  [name] Eye surgery Right     cataract  . Cardiac catheterization  03/18/14    difficult to determine culprit vesel  . Nm myoview ltd  03/19/14    mild inf. ischemia  . Left heart catheterization with coronary angiogram N/A 03/18/2014    Procedure: LEFT HEART CATHETERIZATION WITH CORONARY ANGIOGRAM;  Surgeon: [name], MD;  Location: [facility] CATH LAB;  Service: Cardiovascular;  Laterality: N/A;     Allergies  Allergies  Allergen Reactions  . Statins Other (See Comments)    Body aches     Home Medications  Prior to Admission medications   Medication Sig Start Date End Date Taking? Authorizing Provider  albuterol (PROVENTIL HFA;VENTOLIN HFA) 108 (90 BASE) MCG/ACT inhaler Inhale 1 puff into the lungs every 6 (six) hours as needed for wheezing or shortness of breath.     Historical Provider, MD  amLODipine-benazepril (LOTREL) 5-20 MG per capsule Take 1 capsule by mouth daily. 11/07/14   Herminio Commons, MD  aspirin EC 81 MG tablet Take 81 mg by mouth daily.     Historical Provider, MD  carvedilol (COREG) 6.25 MG tablet Take 6.25 mg by mouth 2 (two) times daily with a meal.    Historical Provider, MD  clopidogrel (PLAVIX) 75 MG tablet TAKE 1 TABLET DAILY 02/19/15   Herminio Commons, MD  ezetimibe (ZETIA) 10 MG tablet Take 10 mg by mouth daily.    Historical Provider, MD  fenofibrate micronized (LOFIBRA) 134 MG capsule TAKE 1 CAPSULE DAILY BEFORE BREAKFAST 06/30/14   Chipper Herb, MD  meloxicam (MOBIC) 15 MG tablet TAKE 1 TABLET DAILY 06/17/14   Lysbeth Penner, FNP  metFORMIN (GLUCOPHAGE) 1000 MG tablet Take 1,000 mg by mouth 2 (two) times daily with a meal.    Historical Provider, MD  nitroGLYCERIN (NITROSTAT) 0.4 MG SL tablet Place 1 tablet (0.4 mg total) under the tongue every 5 (five) minutes x 3 doses as  needed for chest pain. 03/21/14   Isaiah Serge, NP  sertraline (ZOLOFT) 100 MG tablet Take 100 mg by mouth daily.    Historical Provider, MD  tiotropium (SPIRIVA) 18 MCG inhalation capsule Place 18 mcg into inhaler and inhale daily.    Historical Provider, MD   Family History  Family History  Problem Relation Age of Onset  . Diabetes Father   . Heart disease Father   . Hypertension Father   . Diabetes Paternal Grandmother   . Heart disease Paternal Grandmother   . Hypertension Paternal Grandmother   . Diabetes Paternal Uncle   . Heart disease Paternal Uncle   . Hypertension Paternal Uncle    No family status information on file.   Social History  Social History   Social History  . Marital Status: Married  Spouse Name: N/A  . Number of Children: N/A  . Years of Education: N/A   Occupational History  . Not on file.   Social History Main Topics  . Smoking status: Former Smoker -- 0.75 packs/day for 30 years    Types: Cigarettes    Start date: 01/02/1961    Quit date: 05/31/1987  . Smokeless tobacco: Never Used  . Alcohol Use: No  . Drug Use: No  . Sexual Activity: Not on file   Other Topics Concern  . Not on file   Social History Narrative    The patient has significant bilateral shoulder pain that he is getting rehabilitation for currently.  He also has moderate frequency and nocturia.  All other systems reviewed and are otherwise negative except as noted above.  Physical Exam  Blood pressure 133/79, pulse 58, temperature 97.7 F (36.5 C), temperature source Oral, resp. rate 18, height 5\' 8"  (1.727 m), weight 84.505 kg (186 lb 4.8 oz), SpO2 97 %.  General: Pleasant obese white male currently in no acute distress Psych: Normal affect. Neuro: Alert and oriented X 3. Moves all extremities spontaneously. HEENT: Normal  Neck: Supple without bruits or JVD. Lungs:  Resp regular and unlabored, CTA. Heart: RRR, normal S1 and S2, no S3 or S4 no s3, s4, or  murmurs. Abdomen: Obese Soft, non-tender, non-distended, BS + x 4.  Extremities: Previous scar from knee replacement on the right knee, No clubbing, cyanosis or edema. DP/PT/Radials 2+ and equal bilaterally.  Labs   Lab Results  Component Value Date   WBC 5.8 03/19/2014   HGB 15.9 03/19/2014   HCT 46.3 03/19/2014   MCV 93.9 03/19/2014   PLT 192 03/19/2014   No results for input(s): NA, K, CL, CO2, BUN, CREATININE, CALCIUM, PROT, BILITOT, ALKPHOS, ALT, AST, GLUCOSE in the last 168 hours.  Invalid input(s): LABALBU Lab Results  Component Value Date   CHOL 132 01/27/2015   HDL 36* 01/27/2015   LDLCALC 57 01/27/2015   TRIG 194* 01/27/2015     Radiology/Studies Chest x-ray report from [facility] reveals mild scarring in the left base  ECG  NSR HR 63. Borderline prolonged PR  ASSESSMENT AND PLAN  Eduardo [name] is a 69 y.o. male with a history of CAD s/p multiple PTCAs in the past, thoracic aortic aneurysm, COPD, DM, and OSA who was transferred to [facility] from [facility] today for chest pain concerning for unstable angina.   Unstable angina- planned for [facility] tomorrow. NPO after midnight. -- He saw his cardiothoracic surgeon about 3 weeks ago who did what sounds like a CT scan to monitor aorta. Per patient, the doctor told him he had extensive coronary calcification in his coronary arteries that would require bypass surgery. He had him set up to see a cardiologist and planned for [facility]. -- He has been placed on the cath board tomorrow. If coronary angiography reveals diffuse disease not amendable to PCI, CTCS can be consulted. Patient does not care where this is done, if necessary.  -- Troponin negative and ECG with no acute ST or TW changes at [facility]. Will continue to cycle enzymes -- Continue heparin gtt,  ASA, plavix, and coreg. He is intolerant to statins. Continue zetia  CAD: Previous diffuse 3 vessel with calcification  HTN- BP well controlled. Continue  amlodopine/benazepril   HLD- cont Zetia and fenofibrate  DM- we will hold his metformin with cardiac cath planned for tomorrow. I will place him on SSI while inpatient.  Thoracic aortic aneurysm-  Followed by [facility]. Stable per patient.  Carotid artery stenosis- Carotid Dopplers in October 2015 demonstrated 1-39% bilateral carotid artery stenosis. Continue aspirin and Plavix. Intolerant to statins.  Plan: Catheterization planned tomorrow. Cardiac catheterization was discussed with the patient fully including risks of myocardial infarction, death, stroke, bleeding, arrhythmia, dye allergy, renal insufficiency or bleeding.  The patient understands and is willing to proceed.  Possibility of intervention also discussed with patient including risks.  Plan of what to do if diffuse disease found also discussed in discussed with patient that recommendations will depend on results of the catheterization.   Signed,  Ezzard Standing, MD [facility] 04/28/2015, 7:41 PM

## 2015-04-28 NOTE — Therapy (Addendum)
[facility] [place], [place], [address] Phone: [phone]   Fax:  [phone]  Physical Therapy Treatment  Patient Details  Name: Eduardo [name] MRN: [id number] Date of Birth: 03/10/1946 Referring Provider: Dr. Evette Doffing  Encounter Date: 04/28/2015    Past Medical History:  Diagnosis Date  . Bilateral carotid artery disease ([place]) 04/28/2015   1-39 percent bilateral stenosis noted on Doppler   . CAD (coronary artery disease)   . Cervical disc disease   . Chemical exposure    agent orange   . COPD (chronic obstructive pulmonary disease) ([place])   . Diabetes mellitus without complication ([place])   . Diverticulitis   . Hypertension   . Lumbar disc disease   . Obesity (BMI 30-39.9)   . PTSD (post-traumatic stress disorder)   . Sleep apnea     Past Surgical History:  Procedure Laterality Date  . CARDIAC CATHETERIZATION    . CARDIAC CATHETERIZATION  03/18/14   difficult to determine culprit vesel  . CARDIAC CATHETERIZATION N/A 04/29/2015   Procedure: Left Heart Cath and Coronary Angiography;  Surgeon: [name], MD;  Location: [place] CV LAB;  Service: Cardiovascular;  Laterality: N/A;  . CERVICAL SPINE SURGERY    . CHOLECYSTECTOMY    . CORONARY ARTERY BYPASS GRAFT N/A 05/04/2015   Procedure: CORONARY ARTERY BYPASS GRAFTING (CABG)  x four,  using left internal mammary artery, and bilateral thigh greater saphenous veins;  Surgeon: [name], MD;  Location: [place];  Service: Open Heart Surgery;  Laterality: N/A;  . EYE SURGERY Right    cataract  . I&D EXTREMITY Left 05/30/2013   Procedure: IRRIGATION AND DEBRIDEMENT Left Hand and Foreign body removal;  Surgeon: [name], MD;  Location: [place];  Service: Orthopedics;  Laterality: Left;  . LEFT HEART CATHETERIZATION WITH CORONARY ANGIOGRAM N/A 03/18/2014   Procedure: LEFT HEART CATHETERIZATION WITH CORONARY ANGIOGRAM;  Surgeon: [name],  MD;  Location: [facility] CATH LAB;  Service: Cardiovascular;  Laterality: N/A;  . LUMBAR LAMINECTOMY    . PARTIAL COLECTOMY     For diverticulitis  . PTCA     unsuccesful  . REPLACEMENT TOTAL KNEE     right   . SHOULDER ARTHROSCOPY    . SHOULDER SURGERY Bilateral   . TEE WITHOUT CARDIOVERSION N/A 05/04/2015   Procedure: TRANSESOPHAGEAL ECHOCARDIOGRAM (TEE);  Surgeon: [name], MD;  Location: [place];  Service: Open Heart Surgery;  Laterality: N/A;    There were no vitals filed for this visit.  Visit Diagnosis:  Bilateral shoulder pain - Plan: PT plan of care cert/re-cert  Right shoulder pain - Plan: PT plan of care cert/re-cert  Left shoulder pain - Plan: PT plan of care cert/re-cert                                 PT Short Term Goals - 04/28/15 2202      PT SHORT TERM GOAL #1   Title Ind with initial HEP.   Time 2   Period Weeks   Status Achieved           PT Long Term Goals - 04/28/15 5427      PT LONG TERM GOAL #1   Title Ind with advanced HEP.   Time 8   Period Weeks   Status On-going     PT LONG TERM GOAL #2   Title 5/5 bilateral  strength to increase stability for functional tasks.   Time 8   Period Weeks   Status On-going     PT LONG TERM GOAL #3   Title Perform ADL's with pain not > 3/10.   Time 8   Status Achieved     PT LONG TERM GOAL #4   Title Lower object weighing 5# from above shoulder height with left shoulder and pain not > 3/10.   Time 8   Period Weeks   Status On-going               Problem List Patient Active Problem List   Diagnosis Date Noted  . Chest pain 09/26/2015  . Elevated TSH 05/08/2015  . S/P CABG x 4   . Coronary artery disease involving native coronary artery of native heart with unstable angina pectoris ([place])   . Bilateral carotid artery disease ([place]) 04/28/2015  . BMI 28.0-28.9,adult   . Unstable angina ([place]) 03/18/2014  . Type 2 diabetes mellitus without complication, without  long-term current use of insulin ([place]) 09/21/2013  . COPD (chronic obstructive pulmonary disease) ([place]) 09/21/2013  . PTSD (post-traumatic stress disorder) 09/21/2013  . CAD (coronary artery disease) 04/16/2013  . HTN (hypertension) 04/16/2013  . Hyperlipidemia 04/16/2013    APP[place], [place], PTA 03/14/2016, 6:48 PM  [facility] [address] [place], [place], [address] Phone: [phone]   Fax:  [phone]  Name: Eduardo [name] MRN: [id number] Date of Birth: 12-12-1945  PHYSICAL THERAPY DISCHARGE SUMMARY  Visits from Start of Care: 7.  Current functional level related to goals / functional outcomes: Please see above.   Remaining deficits: Continued shoulder pain.   Education / Equipment: HEP. Plan: Patient agrees to discharge.  Patient goals were partially met. Patient is being discharged due to                                                     ?????          Mali Applegate MPT

## 2015-04-29 ENCOUNTER — Encounter (HOSPITAL_COMMUNITY): Payer: Self-pay | Admitting: Cardiology

## 2015-04-29 ENCOUNTER — Ambulatory Visit (HOSPITAL_COMMUNITY)
Admission: RE | Admit: 2015-04-29 | Payer: Medicare Other | Source: Ambulatory Visit | Admitting: Interventional Cardiology

## 2015-04-29 ENCOUNTER — Other Ambulatory Visit: Payer: Self-pay

## 2015-04-29 ENCOUNTER — Other Ambulatory Visit: Payer: Self-pay | Admitting: *Deleted

## 2015-04-29 ENCOUNTER — Encounter (HOSPITAL_COMMUNITY)
Admission: AD | Disposition: A | Discharge status: Home / Self Care | Payer: Self-pay | Source: Other Acute Inpatient Hospital | Attending: Cardiothoracic Surgery

## 2015-04-29 DIAGNOSIS — D62 Acute posthemorrhagic anemia: Secondary | ICD-10-CM | POA: Diagnosis not present

## 2015-04-29 DIAGNOSIS — K913 Postprocedural intestinal obstruction: Secondary | ICD-10-CM | POA: Diagnosis not present

## 2015-04-29 DIAGNOSIS — I6523 Occlusion and stenosis of bilateral carotid arteries: Secondary | ICD-10-CM | POA: Diagnosis present

## 2015-04-29 DIAGNOSIS — E1151 Type 2 diabetes mellitus with diabetic peripheral angiopathy without gangrene: Secondary | ICD-10-CM | POA: Diagnosis not present

## 2015-04-29 DIAGNOSIS — I200 Unstable angina: Secondary | ICD-10-CM

## 2015-04-29 DIAGNOSIS — J439 Emphysema, unspecified: Secondary | ICD-10-CM | POA: Diagnosis not present

## 2015-04-29 DIAGNOSIS — F4310 Post-traumatic stress disorder, unspecified: Secondary | ICD-10-CM | POA: Diagnosis present

## 2015-04-29 DIAGNOSIS — I10 Essential (primary) hypertension: Secondary | ICD-10-CM | POA: Diagnosis not present

## 2015-04-29 DIAGNOSIS — Z7984 Long term (current) use of oral hypoglycemic drugs: Secondary | ICD-10-CM | POA: Diagnosis not present

## 2015-04-29 DIAGNOSIS — Z7982 Long term (current) use of aspirin: Secondary | ICD-10-CM | POA: Diagnosis not present

## 2015-04-29 DIAGNOSIS — I25110 Atherosclerotic heart disease of native coronary artery with unstable angina pectoris: Principal | ICD-10-CM

## 2015-04-29 DIAGNOSIS — Z8249 Family history of ischemic heart disease and other diseases of the circulatory system: Secondary | ICD-10-CM | POA: Diagnosis not present

## 2015-04-29 DIAGNOSIS — E669 Obesity, unspecified: Secondary | ICD-10-CM | POA: Diagnosis present

## 2015-04-29 DIAGNOSIS — I2510 Atherosclerotic heart disease of native coronary artery without angina pectoris: Secondary | ICD-10-CM

## 2015-04-29 DIAGNOSIS — R944 Abnormal results of kidney function studies: Secondary | ICD-10-CM | POA: Diagnosis not present

## 2015-04-29 DIAGNOSIS — Z7902 Long term (current) use of antithrombotics/antiplatelets: Secondary | ICD-10-CM | POA: Diagnosis not present

## 2015-04-29 DIAGNOSIS — Z833 Family history of diabetes mellitus: Secondary | ICD-10-CM | POA: Diagnosis not present

## 2015-04-29 DIAGNOSIS — R079 Chest pain, unspecified: Secondary | ICD-10-CM | POA: Diagnosis not present

## 2015-04-29 DIAGNOSIS — R1110 Vomiting, unspecified: Secondary | ICD-10-CM | POA: Diagnosis not present

## 2015-04-29 DIAGNOSIS — E119 Type 2 diabetes mellitus without complications: Secondary | ICD-10-CM | POA: Diagnosis not present

## 2015-04-29 DIAGNOSIS — Z9889 Other specified postprocedural states: Secondary | ICD-10-CM | POA: Diagnosis not present

## 2015-04-29 DIAGNOSIS — J9811 Atelectasis: Secondary | ICD-10-CM | POA: Diagnosis not present

## 2015-04-29 DIAGNOSIS — I080 Rheumatic disorders of both mitral and aortic valves: Secondary | ICD-10-CM | POA: Diagnosis not present

## 2015-04-29 DIAGNOSIS — I700 Atherosclerosis of aorta: Secondary | ICD-10-CM | POA: Diagnosis not present

## 2015-04-29 DIAGNOSIS — E039 Hypothyroidism, unspecified: Secondary | ICD-10-CM | POA: Diagnosis present

## 2015-04-29 DIAGNOSIS — Z77098 Contact with and (suspected) exposure to other hazardous, chiefly nonmedicinal, chemicals: Secondary | ICD-10-CM | POA: Diagnosis present

## 2015-04-29 DIAGNOSIS — I712 Thoracic aortic aneurysm, without rupture: Secondary | ICD-10-CM | POA: Diagnosis present

## 2015-04-29 DIAGNOSIS — Z888 Allergy status to other drugs, medicaments and biological substances status: Secondary | ICD-10-CM | POA: Diagnosis not present

## 2015-04-29 DIAGNOSIS — Z01818 Encounter for other preprocedural examination: Secondary | ICD-10-CM | POA: Diagnosis not present

## 2015-04-29 DIAGNOSIS — G4733 Obstructive sleep apnea (adult) (pediatric): Secondary | ICD-10-CM | POA: Diagnosis present

## 2015-04-29 DIAGNOSIS — E785 Hyperlipidemia, unspecified: Secondary | ICD-10-CM | POA: Diagnosis not present

## 2015-04-29 DIAGNOSIS — Z87891 Personal history of nicotine dependence: Secondary | ICD-10-CM | POA: Diagnosis not present

## 2015-04-29 DIAGNOSIS — J90 Pleural effusion, not elsewhere classified: Secondary | ICD-10-CM | POA: Diagnosis not present

## 2015-04-29 DIAGNOSIS — I4891 Unspecified atrial fibrillation: Secondary | ICD-10-CM | POA: Diagnosis present

## 2015-04-29 DIAGNOSIS — I440 Atrioventricular block, first degree: Secondary | ICD-10-CM | POA: Diagnosis present

## 2015-04-29 DIAGNOSIS — Z951 Presence of aortocoronary bypass graft: Secondary | ICD-10-CM | POA: Diagnosis not present

## 2015-04-29 DIAGNOSIS — E1165 Type 2 diabetes mellitus with hyperglycemia: Secondary | ICD-10-CM | POA: Diagnosis not present

## 2015-04-29 DIAGNOSIS — J449 Chronic obstructive pulmonary disease, unspecified: Secondary | ICD-10-CM | POA: Diagnosis present

## 2015-04-29 DIAGNOSIS — I2584 Coronary atherosclerosis due to calcified coronary lesion: Secondary | ICD-10-CM | POA: Diagnosis present

## 2015-04-29 DIAGNOSIS — Z6830 Body mass index (BMI) 30.0-30.9, adult: Secondary | ICD-10-CM | POA: Diagnosis not present

## 2015-04-29 DIAGNOSIS — R0789 Other chest pain: Secondary | ICD-10-CM | POA: Diagnosis not present

## 2015-04-29 DIAGNOSIS — Z791 Long term (current) use of non-steroidal anti-inflammatories (NSAID): Secondary | ICD-10-CM | POA: Diagnosis not present

## 2015-04-29 DIAGNOSIS — Z9861 Coronary angioplasty status: Secondary | ICD-10-CM | POA: Diagnosis not present

## 2015-04-29 HISTORY — PX: CARDIAC CATHETERIZATION: SHX172

## 2015-04-29 LAB — CBC
HCT: 42.5 % (ref 39.0–52.0)
Hemoglobin: 14.3 g/dL (ref 13.0–17.0)
MCH: 31.2 pg (ref 26.0–34.0)
MCHC: 33.6 g/dL (ref 30.0–36.0)
MCV: 92.8 fL (ref 78.0–100.0)
PLATELETS: 197 10*3/uL (ref 150–400)
RBC: 4.58 MIL/uL (ref 4.22–5.81)
RDW: 13 % (ref 11.5–15.5)
WBC: 5.9 10*3/uL (ref 4.0–10.5)

## 2015-04-29 LAB — BASIC METABOLIC PANEL
Anion gap: 8 (ref 5–15)
BUN: 18 mg/dL (ref 6–20)
CO2: 25 mmol/L (ref 22–32)
Calcium: 8.8 mg/dL — ABNORMAL LOW (ref 8.9–10.3)
Chloride: 105 mmol/L (ref 101–111)
Creatinine, Ser: 0.75 mg/dL (ref 0.61–1.24)
GFR calc Af Amer: >60 mL/min (ref >60)
GLUCOSE: 147 mg/dL — AB (ref 65–99)
POTASSIUM: 4.2 mmol/L (ref 3.5–5.1)
Sodium: 138 mmol/L (ref 135–145)

## 2015-04-29 LAB — COMPREHENSIVE METABOLIC PANEL
ALBUMIN: 3.6 g/dL (ref 3.5–5.0)
ALK PHOS: 47 U/L (ref 38–126)
ALT: 21 U/L (ref 17–63)
AST: 19 U/L (ref 15–41)
Anion gap: 8 (ref 5–15)
BILIRUBIN TOTAL: 0.3 mg/dL (ref 0.3–1.2)
BUN: 18 mg/dL (ref 6–20)
CALCIUM: 8.8 mg/dL — AB (ref 8.9–10.3)
CO2: 26 mmol/L (ref 22–32)
CREATININE: 0.78 mg/dL (ref 0.61–1.24)
Chloride: 105 mmol/L (ref 101–111)
GFR calc Af Amer: >60 mL/min (ref >60)
GFR calc non Af Amer: >60 mL/min (ref >60)
GLUCOSE: 124 mg/dL — AB (ref 65–99)
Potassium: 3.9 mmol/L (ref 3.5–5.1)
SODIUM: 139 mmol/L (ref 135–145)
Total Protein: 5.7 g/dL — ABNORMAL LOW (ref 6.5–8.1)

## 2015-04-29 LAB — HEPARIN LEVEL (UNFRACTIONATED)
Heparin Unfractionated: 0.22 IU/mL — ABNORMAL LOW (ref 0.30–0.70)
Heparin Unfractionated: 0.15 IU/mL — ABNORMAL LOW (ref 0.30–0.70)

## 2015-04-29 LAB — GLUCOSE, CAPILLARY
GLUCOSE-CAPILLARY: 128 mg/dL — AB (ref 65–99)
GLUCOSE-CAPILLARY: 170 mg/dL — AB (ref 65–99)
Glucose-Capillary: 135 mg/dL — ABNORMAL HIGH (ref 65–99)
Glucose-Capillary: 171 mg/dL — ABNORMAL HIGH (ref 65–99)

## 2015-04-29 LAB — LIPID PANEL
Cholesterol: 122 mg/dL (ref 0–200)
HDL: 29 mg/dL — ABNORMAL LOW (ref >40)
LDL CALC: 36 mg/dL (ref 0–99)
TRIGLYCERIDES: 284 mg/dL — AB (ref <150)
Total CHOL/HDL Ratio: 4.2 RATIO
VLDL: 57 mg/dL — AB (ref 0–40)

## 2015-04-29 LAB — PROTIME-INR
INR: 1.11 (ref 0.00–1.49)
Prothrombin Time: 14.5 seconds (ref 11.6–15.2)

## 2015-04-29 LAB — HEMOGLOBIN A1C
Hgb A1c MFr Bld: 7.3 % — ABNORMAL HIGH (ref 4.8–5.6)
Mean Plasma Glucose: 163 mg/dL

## 2015-04-29 LAB — TROPONIN I: Troponin I: <0.03 ng/mL (ref <0.031)

## 2015-04-29 SURGERY — LEFT HEART CATH AND CORONARY ANGIOGRAPHY
Anesthesia: LOCAL

## 2015-04-29 MED ORDER — LIDOCAINE HCL (PF) 1 % IJ SOLN
INTRAMUSCULAR | Status: AC
  Filled 2015-04-29: qty 30

## 2015-04-29 MED ORDER — SODIUM CHLORIDE 0.9 % IJ SOLN
3.0000 mL | Freq: Two times a day (BID) | INTRAMUSCULAR | Status: DC
  Administered 2015-04-30 – 2015-05-03 (×5): 3 mL via INTRAVENOUS

## 2015-04-29 MED ORDER — HEPARIN SODIUM (PORCINE) 5000 UNIT/ML IJ SOLN: 5000.0000 [IU] | Freq: Three times a day (TID) | INTRAMUSCULAR | Status: DC

## 2015-04-29 MED ORDER — SODIUM CHLORIDE 0.9 % IV SOLN: 250.0000 mL | INTRAVENOUS | Status: DC | PRN

## 2015-04-29 MED ORDER — ONDANSETRON HCL 4 MG/2ML IJ SOLN
4.0000 mg | Freq: Four times a day (QID) | INTRAMUSCULAR | Status: DC | PRN
Start: 2015-04-29 — End: 2015-04-29

## 2015-04-29 MED ORDER — HEPARIN (PORCINE) IN NACL 100-0.45 UNIT/ML-% IJ SOLN
2150.0000 [IU]/h | INTRAMUSCULAR | Status: DC
  Administered 2015-04-29: 1600 [IU]/h via INTRAVENOUS
  Administered 2015-05-01: 2050 [IU]/h via INTRAVENOUS
  Administered 2015-05-01: 1950 [IU]/h via INTRAVENOUS
  Administered 2015-05-02 (×3): 2300 [IU]/h via INTRAVENOUS
  Filled 2015-04-29 (×10): qty 250

## 2015-04-29 MED ORDER — HEPARIN (PORCINE) IN NACL 2-0.9 UNIT/ML-% IJ SOLN
INTRAMUSCULAR | Status: AC
  Filled 2015-04-29: qty 1000

## 2015-04-29 MED ORDER — ACETAMINOPHEN 325 MG PO TABS: 650.0000 mg | ORAL_TABLET | ORAL | Status: DC | PRN

## 2015-04-29 MED ORDER — HEPARIN SODIUM (PORCINE) 1000 UNIT/ML IJ SOLN
INTRAMUSCULAR | Status: AC
  Filled 2015-04-29: qty 1

## 2015-04-29 MED ORDER — LIDOCAINE HCL (PF) 1 % IJ SOLN
INTRAMUSCULAR | Status: DC | PRN
  Administered 2015-04-29: 15:00:00

## 2015-04-29 MED ORDER — MIDAZOLAM HCL 2 MG/2ML IJ SOLN
INTRAMUSCULAR | Status: AC
  Filled 2015-04-29: qty 2

## 2015-04-29 MED ORDER — HEPARIN SODIUM (PORCINE) 1000 UNIT/ML IJ SOLN
INTRAMUSCULAR | Status: DC | PRN
  Administered 2015-04-29: 4500 [IU] via INTRAVENOUS

## 2015-04-29 MED ORDER — MIDAZOLAM HCL 2 MG/2ML IJ SOLN
INTRAMUSCULAR | Status: DC | PRN
  Administered 2015-04-29: 1 mg via INTRAVENOUS

## 2015-04-29 MED ORDER — MELOXICAM 15 MG PO TABS: 15.0000 mg | ORAL_TABLET | Freq: Every day | ORAL | Status: DC

## 2015-04-29 MED ORDER — IOHEXOL 350 MG/ML SOLN
INTRAVENOUS | Status: DC | PRN
  Administered 2015-04-29: 80 mL via INTRAVENOUS

## 2015-04-29 MED ORDER — VERAPAMIL HCL 2.5 MG/ML IV SOLN
INTRAVENOUS | Status: AC
  Filled 2015-04-29: qty 2

## 2015-04-29 MED ORDER — VERAPAMIL HCL 2.5 MG/ML IV SOLN
INTRAVENOUS | Status: DC | PRN
  Administered 2015-04-29: 10 mL via INTRA_ARTERIAL

## 2015-04-29 MED ORDER — FENTANYL CITRATE (PF) 100 MCG/2ML IJ SOLN
INTRAMUSCULAR | Status: AC
  Filled 2015-04-29: qty 2

## 2015-04-29 MED ORDER — SODIUM CHLORIDE 0.9 % IJ SOLN: 3.0000 mL | INTRAMUSCULAR | Status: DC | PRN

## 2015-04-29 MED ORDER — CARVEDILOL 6.25 MG PO TABS
6.2500 mg | ORAL_TABLET | Freq: Two times a day (BID) | ORAL | Status: DC
  Administered 2015-04-29 – 2015-05-03 (×10): 6.25 mg via ORAL
  Filled 2015-04-29 (×10): qty 1

## 2015-04-29 MED ORDER — LIDOCAINE HCL (PF) 1 % IJ SOLN
INTRAMUSCULAR | Status: DC | PRN
  Administered 2015-04-29: 2 mL

## 2015-04-29 MED ORDER — SODIUM CHLORIDE 0.9 % WEIGHT BASED INFUSION
1.0000 mL/kg/h | INTRAVENOUS | Status: AC
  Administered 2015-04-29: 1 mL/kg/h via INTRAVENOUS

## 2015-04-29 MED ORDER — FENTANYL CITRATE (PF) 100 MCG/2ML IJ SOLN
INTRAMUSCULAR | Status: DC | PRN
  Administered 2015-04-29: 25 ug via INTRAVENOUS

## 2015-04-29 MED FILL — Heparin Sodium (Porcine) 100 Unt/ML in Sodium Chloride 0.45%: INTRAMUSCULAR | Qty: 250 | Status: AC

## 2015-04-29 SURGICAL SUPPLY — 12 items

## 2015-04-29 NOTE — Progress Notes (Addendum)
Patient ID: Eduardo [name], male   DOB: 19-Mar-1946, 69 y.o.   MRN: [id number]      [address]       [place] [address]             [phone]        Bren[facility]quist [facility] Medical Record [id number] Date of Birth: 06-May-1946  Referring:Dr. Bronson Ing Primary Care: Redge Gainer, MD  Chief Complaint:  Chest pain, SOB  History of Present Illness:      Eduardo [name] is a 69 y.o. male with a history of CAD s/p multiple PTCAs in the past, thoracic aortic aneurysm with recent ct of chest done at [facility] with no current records here, COPD, DM, and OSA who was transferred to [facility] from [facility] today for chest pain concerning for unstable angina. He was to see cardiology at [facility] this friday  He has  had multiple PCTAs but no stents.  done in [place]. He was hospitalized at [facility] in October 2015 for chest pain and underwent coronary angiography on 03/18/14 by Dr. Martinique. This demonstrated complex calcified disease in the RCA with a moderate stenosis at the origin of OM 2 and borderline disease in the mid LAD. LV gram showed EF 55-65%, with no significant mitral regurgitation. It appeared that he likely underwent PTCA of the OM 2 in the past. The RCA had heavy calcification, tortuosity, and eccentric plaque. It was felt that PCI of the RCA would be complex due to this marked tortuosity and calcification, for which reason he underwent a functional assessment of his ischemic burden with a nuclear stress test. Nuclear stress testing demonstrated a moderate sized, small intensity defect involving the mid and basal inferior and inferoseptal walls. There was a small amount of reversibility in the mid and basal inferolateral wall which could represent ischemia. However, there was evidence of soft tissue attenuation as well as diaphragmatic attenuation that could also have accounted for variations in radiotracer uptake. It was felt that it would be best to treat him  medically. Of note, he is entirely intolerant to statin therapy due to severe myalgia.   He was continued on ASA and plavix. He has a history of thoracic aortic aneurysm followed at the [place]. He saw his cardiothoracic surgeon about 3 weeks ago who did what sounds like a CT scan to monitor aorta. Per patient, the doctor told him he had extensive coronary calcification in his coronary arteries that would require bypass surgery. He has noticed intermittent chest pain for the past couple weeks but was trying to hold out until Friday. However, yesterday he felt like "he over did it" shoveling sand and developed chest pain. The patient this morning went to outpatient rehabilitation for bilateral shoulder pain that he has been having.  This morning he walked outside and developed acute chest tightness, nausea, diaphoresis and SOB, which is unusual for him and presented to [facility]. He was given SL NTG which helped his chest pain but gave him a headache and shooting pain down his left neck and shoulder. He currently has 2/10 chest tightness. He was started on IV heparin and transferred to [facility] for unstable angina  Previous smoker , quit [id number].  Current Activity/ Functional Status: Patient is independent with mobility/ambulation, transfers, ADL's, IADL's.   Zubrod Score: At the time of surgery this patient's most appropriate activity status/level should be described as: []     0    Normal activity, no symptoms [x]     1  Restricted in physical strenuous activity but ambulatory, able to do out light work []     2    Ambulatory and capable of self care, unable to do work activities, up and about                 more than 50%  Of the time                            []     3    Only limited self care, in bed greater than 50% of waking hours []     4    Completely disabled, no self care, confined to bed or chair []     5    Moribund  Past Medical History  Diagnosis Date  . COPD (chronic obstructive  pulmonary disease) ([place])   . CAD (coronary artery disease)   . Diabetes mellitus without complication ([place])   . Hypertension   . Sleep apnea   . Chemical exposure     agent orange   . PTSD (post-traumatic stress disorder)   . Diverticulitis   . Obesity (BMI 30-39.9)   . Bilateral carotid artery disease ([place]) 04/28/2015    1-39 percent bilateral stenosis noted on Doppler   . Lumbar disc disease   . Cervical disc disease     Past Surgical History  Procedure Laterality Date  . Lumbar laminectomy    . Cardiac catheterization    . Cholecystectomy    . Shoulder surgery Bilateral   . Replacement total knee      right   . Ptca      unsuccesful  . Cervical spine surgery    . I&d extremity Left 05/30/2013    Procedure: IRRIGATION AND DEBRIDEMENT Left Hand and Foreign body removal;  Surgeon: [name], MD;  Location: [facility];  Service: Orthopedics;  Laterality: Left;  [name] Eye surgery Right     cataract  . Cardiac catheterization  03/18/14    difficult to determine culprit vesel  . Left heart catheterization with coronary angiogram N/A 03/18/2014    Procedure: LEFT HEART CATHETERIZATION WITH CORONARY ANGIOGRAM;  Surgeon: [name], MD;  Location: [facility] CATH LAB;  Service: Cardiovascular;  Laterality: N/A;  . Partial colectomy      For diverticulitis    History  Smoking status  . Former Smoker -- 0.75 packs/day for 30 years  . Types: Cigarettes  . Start date: 01/02/1961  . Quit date: 05/31/1987  Smokeless tobacco  . Never Used    History  Alcohol Use No    Social History   Social History  . Marital Status: Married    Spouse Name: N/A  . Number of Children: N/A  . Years of Education: N/A   Occupational History  . Not on file.   Social History Main Topics  . Smoking status: Former Smoker -- 0.75 packs/day for 30 years    Types: Cigarettes    Start date: 01/02/1961    Quit date: 05/31/1987  . Smokeless tobacco: Never Used  . Alcohol Use: No  . Drug Use: No  .  Sexual Activity: Not on file   Other Topics Concern  . Not on file   Social History Narrative    Allergies  Allergen Reactions  . Statins Other (See Comments)    Body aches    Current Facility-Administered Medications  Medication Dose Route Frequency Provider Last Rate Last Dose  . 0.9 %  sodium chloride infusion  250 mL Intravenous PRN Eileen Stanford, PA-C      . 0.9 %  sodium chloride infusion  250 mL Intravenous PRN [name], MD      . 0.9% sodium chloride infusion  1 mL/kg/hr Intravenous Continuous [name], MD      . acetaminophen (TYLENOL) tablet 650 mg  650 mg Oral Q4H PRN Eileen Stanford, PA-C      . albuterol (PROVENTIL) (2.5 MG/3ML) 0.083% nebulizer solution 2.5 mg  2.5 mg Inhalation Q6H PRN Eileen Stanford, PA-C      . amLODipine (NORVASC) tablet 5 mg  5 mg Oral Daily Leonie Man, MD   5 mg at 04/29/15 [id number]   And  . benazepril (LOTENSIN) tablet 20 mg  20 mg Oral Daily Leonie Man, MD   20 mg at 04/29/15 0954  . aspirin EC tablet 81 mg  81 mg Oral Daily Eileen Stanford, PA-C   81 mg at 04/29/15 [id number]  . carvedilol (COREG) tablet 6.25 mg  6.25 mg Oral BID WC Leonie Man, MD   6.25 mg at 04/29/15 0954  . ezetimibe (ZETIA) tablet 10 mg  10 mg Oral Daily Eileen Stanford, PA-C   10 mg at 04/29/15 [id number]  . fenofibrate tablet 160 mg  160 mg Oral Daily Eileen Stanford, PA-C   160 mg at 04/29/15 [id number]  . heparin ADULT infusion 100 units/mL (25000 units/250 mL)  1,600 Units/hr Intravenous Continuous Leonie Man, MD      . insulin aspart (novoLOG) injection 0-15 Units  0-15 Units Subcutaneous TID [facility] Eileen Stanford, PA-C   2 Units at 04/29/15 [phone]  . [START ON 05/01/2015] meloxicam (MOBIC) tablet 15 mg  15 mg Oral Daily [name], MD      . nitroGLYCERIN (NITROSTAT) SL tablet 0.4 mg  0.4 mg Sublingual Q5 Min x 3 PRN Eileen Stanford, PA-C      . nitroGLYCERIN (NITROSTAT) SL tablet 0.4 mg  0.4 mg Sublingual Q5 Min x 3  PRN Eileen Stanford, PA-C      . ondansetron [facility]) injection 4 mg  4 mg Intravenous Q6H PRN Eileen Stanford, PA-C      . sertraline (ZOLOFT) tablet 100 mg  100 mg Oral Daily Eileen Stanford, PA-C   100 mg at 04/29/15 0954  . sodium chloride 0.9 % injection 3 mL  3 mL Intravenous Q12H Eileen Stanford, PA-C   0 mL at 04/28/15 2200  . sodium chloride 0.9 % injection 3 mL  3 mL Intravenous PRN Eileen Stanford, PA-C      . sodium chloride 0.9 % injection 3 mL  3 mL Intravenous Q12H [name], MD      . sodium chloride 0.9 % injection 3 mL  3 mL Intravenous PRN [name], MD      . tiotropium [facility]) inhalation capsule 18 mcg  18 mcg Inhalation Daily Eileen Stanford, PA-C   18 mcg at 04/29/15 [id number]    Prescriptions prior to admission  Medication Sig Dispense Refill Last Dose  . albuterol (PROVENTIL HFA;VENTOLIN HFA) 108 (90 BASE) MCG/ACT inhaler Inhale 1 puff into the lungs every 6 (six) hours as needed for wheezing or shortness of breath.    Past Week at Unknown time  . amLODipine-benazepril (LOTREL) 5-20 MG per capsule Take 1 capsule by mouth daily. 90 capsule 3 04/28/2015 at Unknown time  . aspirin EC  81 MG tablet Take 81 mg by mouth daily.    04/28/2015 at Unknown time  . carvedilol (COREG) 6.25 MG tablet Take 6.25 mg by mouth 2 (two) times daily with a meal.   04/28/2015 at 0600  . clopidogrel (PLAVIX) 75 MG tablet TAKE 1 TABLET DAILY 90 tablet 2 04/28/2015 at Unknown time  . ezetimibe (ZETIA) 10 MG tablet Take 10 mg by mouth daily.   04/28/2015 at Unknown time  . fenofibrate micronized (LOFIBRA) 134 MG capsule TAKE 1 CAPSULE DAILY BEFORE BREAKFAST 30 capsule 0 04/28/2015 at Unknown time  . meloxicam (MOBIC) 15 MG tablet TAKE 1 TABLET DAILY (Patient taking differently: TAKE 1 TABLET DAILY as needed for pain) 30 tablet 0 Past Month at Unknown time  . metFORMIN (GLUCOPHAGE) 1000 MG tablet Take 1,000 mg by mouth 2 (two) times daily with a meal.   04/28/2015  at Unknown time  . sertraline (ZOLOFT) 100 MG tablet Take 100 mg by mouth daily.   04/28/2015 at Unknown time  . tiotropium (SPIRIVA) 18 MCG inhalation capsule Place 18 mcg into inhaler and inhale daily.   04/28/2015 at Unknown time  . nitroGLYCERIN (NITROSTAT) 0.4 MG SL tablet Place 1 tablet (0.4 mg total) under the tongue every 5 (five) minutes x 3 doses as needed for chest pain. 75 tablet 1 unknown at unknown    Family History  Problem Relation Age of Onset  . Diabetes Father   . Heart disease Father   . Hypertension Father   . Diabetes Paternal Grandmother   . Heart disease Paternal Grandmother   . Hypertension Paternal Grandmother   . Diabetes Paternal Uncle   . Heart disease Paternal Uncle   . Hypertension Paternal Uncle      Review of Systems:      Cardiac Review of Systems: Y or N  Chest Pain [ Y  ] Exertional SOB  Blue.Reese  ]  Vertell Limber Florencio.Farrier  ]   Pedal Edema [  n ]    Palpitations [n  ] Syncope  Florencio.Farrier  ]   Presyncope [ n  ]  General Review of Systems: [Y] = yes [  ]=no Constitional: recent weight change [ n ]; anorexia [  ]; fatigue Blue.Reese  ]; nausea [  ]; night sweats [ n ]; fever [n  ]; or chills [  ]                                                               Dental: poor dentition[  ]; Last Dentist visit:   Eye : blurred vision [  ]; diplopia [   ]; vision changes [  ];  Amaurosis fugax[  ]; Resp: cough [  ];  wheezing[  ];  hemoptysis[  ]; shortness of breath[ y ]; paroxysmal nocturnal dyspnea[ y ]; dyspnea on exertion[y  ]; or orthopnea[  ];  GI:  gallstones[  ], vomiting[  ];  dysphagia[  ]; melena[  ];  hematochezia [  ]; heartburn[ y ];   Hx of  Colonoscopy[  ]; GU: kidney stones [  ]; hematuria[  ];   dysuria [  ];  nocturia[  ];  history of     obstruction [  ]; urinary frequency [  ]  Skin: rash, swelling[  ];, hair loss[  ];  peripheral edema[  ];  or itching[  ]; Musculosketetal: myalgias[  ];  joint swelling[  ];  joint erythema[  ];  joint pain[  ];  back  pain[  ];  Heme/Lymph: bruising[  ];  bleeding[  ];  anemia[  ];  Neuro: TIA[  ];  headaches[  ];  stroke[  ];  vertigo[  ];  seizures[  ];   paresthesias[  ];  difficulty walking[n  ];  Psych:depression[  ]; anxiety[  ];  Endocrine: diabetes[  ];  thyroid dysfunction[  ];  Immunizations: Flu [  y]; Pneumococcal[y  ];  Other:  Physical Exam: BP 137/60 mmHg  Pulse 63  Temp(Src) 97.7 F (36.5 C) (Oral)  Resp 16  Ht 5\' 8"  (1.727 m)  Wt 195 lb 12.8 oz (88.814 kg)  BMI 29.78 kg/m2  SpO2 94%   General appearance: alert, cooperative and no distress Head: Normocephalic, without obvious abnormality, atraumatic Neck: no adenopathy, no carotid bruit, no JVD, supple, symmetrical, trachea midline and thyroid not enlarged, symmetric, no tenderness/mass/nodules Lymph nodes: Cervical, supraclavicular, and axillary nodes normal. Resp: clear to auscultation bilaterally Back: symmetric, no curvature. ROM normal. No CVA tenderness. Cardio: regular rate and rhythm, S1, S2 normal, no murmur, click, rub or gallop GI: soft, non-tender; bowel sounds normal; no masses,  no organomegaly Extremities: extremities normal, atraumatic, no cyanosis or edema and Homans sign is negative, no sign of DVT Neurologic: Grossly normal + 2 dp ,pt pulses bilateral  Diagnostic Studies & Laboratory data:     Recent Radiology Findings:   No results found.   I have independently reviewed the above radiologic studies.  Recent Lab Findings: Lab Results  Component Value Date   WBC 5.9 04/29/2015   HGB 14.3 04/29/2015   HCT 42.5 04/29/2015   PLT 197 04/29/2015   GLUCOSE 147* 04/29/2015   CHOL 122 04/29/2015   TRIG 284* 04/29/2015   HDL 29* 04/29/2015   LDLCALC 36 04/29/2015   ALT 21 04/29/2015   AST 19 04/29/2015   NA 138 04/29/2015   K 4.2 04/29/2015   CL 105 04/29/2015   CREATININE 0.75 04/29/2015   BUN 18 04/29/2015   CO2 25 04/29/2015   TSH 1.792 04/28/2015   INR 1.11 04/29/2015   HGBA1C 7.3*  04/28/2015   Cath films reviewed  Procedures    Left Heart Cath and Coronary Angiography    Conclusion     Prox RCA lesion, 50% stenosed, heavily calcified and eccentric.  Mid RCA lesion, 70% stenosed, heavily calcified and eccentric.  Mid Cx lesion at ostium of OM2, 75% stenosed- seen best in the cranial view and noted on 2015 cath.  Mid LAD lesion, 80% stenosed- progressed since the 2015 cath.  Ost 2nd Diag to 2nd Diag lesion, 60% stenosed.  The left ventricular systolic function is normal.  Discussed with the inpatient team (Dr. Martinique). Patient with thoracic aneurysm of unknown size. Given how difficult RCA PCI would be due to calcification and tortuosity, will get cardiac surgery evaluation.   Stop clopidogrel at this time. Resume heparin 8 hours post sheath pull.        Assessment / Plan:   Cardiology to obtain records from [facility], ct scan done three weeks ago to review, no evaluation for thoracic  aneurysm   done here , will get echocardiogram Unstable angina with 3 v CAD  consider cabg Monday after washout of plavix     I  spent 40 minutes counseling the patient face to face and 50% or more the  time was spent in counseling and coordination of care. The total time spent in the appointment was 60 minutes.    [name] MD      [address] [place] [address] Office [phone]   Beeper [phone]  04/29/2015 4:46 PM

## 2015-04-29 NOTE — Progress Notes (Signed)
TR band removed. Vitals WNL. Site is level 0. Gauze and transparent dressing applied.

## 2015-04-29 NOTE — Progress Notes (Signed)
Patient Name: Eduardo [name] Date of Encounter: 04/29/2015  Active Problems:   CAD (coronary artery disease)   HTN (hypertension)   Hyperlipidemia   Type 2 diabetes mellitus without complication, without long-term current use of insulin (HCC)   COPD (chronic obstructive pulmonary disease) (HCC)   PTSD (post-traumatic stress disorder)   Unstable angina [facility])     Primary Cardiologist: Dr. Bronson Ing Patient Profile: 69 yo male w/ PMH of CAD (s/p multiple PTCAs in the past but no stents), thoracic aortic aneurysm, COPD, DM, and OSA who was transferred to [facility] from [facility] on 04/28/2015 for chest pain concerning for unstable angina.   SUBJECTIVE: Denies any recurrence in his chest pain overnight. Denies any palpitations or dyspnea. No remaining questions or concerns regarding his catheterization today.  OBJECTIVE Filed Vitals:   04/28/15 1838 04/28/15 2109 04/28/15 2300 04/29/15 0403  BP: 133/79 114/62 122/72 112/66  Pulse: 58 62 58 58  Temp: 97.7 F (36.5 C) 98.1 F (36.7 C) 98 F (36.7 C) 97.5 F (36.4 C)  TempSrc: Oral Oral Oral Oral  Resp: 18 18 18 18   Height: 5\' 8"  (1.727 m)     Weight: 186 lb 4.8 oz (84.505 kg)   195 lb 12.8 oz (88.814 kg)  SpO2: 97% 93% 93% 94%    Intake/Output Summary (Last 24 hours) at 04/29/15 [id number] Last data filed at 04/29/15 0600  Gross per 24 hour  Intake 549.67 ml  Output    400 ml  Net 149.67 ml   Filed Weights   04/28/15 1838 04/29/15 0403  Weight: 186 lb 4.8 oz (84.505 kg) 195 lb 12.8 oz (88.814 kg)    PHYSICAL EXAM General: Well developed, well nourished, male in no acute distress. Head: Normocephalic, atraumatic.  Neck: Supple without bruits, JVD not elevated. Lungs:  Resp regular and unlabored, CTA without wheezing or rales. Heart: RRR, S1, S2, no S3, S4, or murmur; no rub. Abdomen: Soft, non-tender, non-distended with normoactive bowel sounds. No hepatomegaly. No rebound/guarding. No obvious abdominal  masses. Extremities: No clubbing, cyanosis, or edema. Distal pedal pulses are 2+ bilaterally. Neuro: Alert and oriented X 3. Moves all extremities spontaneously. Psych: Normal affect.  LABS: CBC: Recent Labs  04/29/15 0714  WBC 5.9  HGB 14.3  HCT 42.5  MCV 92.8  PLT 197   INR: Recent Labs  04/29/15 0714  INR 0000000   Basic Metabolic Panel: Recent Labs  04/28/15 2049 04/29/15 0120 04/29/15 0714  NA  --  139 138  K  --  3.9 4.2  CL  --  105 105  CO2  --  26 25  GLUCOSE  --  124* 147*  BUN  --  18 18  CREATININE  --  0.78 0.75  CALCIUM  --  8.8* 8.8*  MG 1.5*  --   --    Liver Function Tests: Recent Labs  04/29/15 0120  AST 19  ALT 21  ALKPHOS 47  BILITOT 0.3  PROT 5.7*  ALBUMIN 3.6   Cardiac Enzymes: Recent Labs  04/28/15 2049 04/29/15 0120 04/29/15 0714  TROPONINI <0.03 <0.03 <0.03   Fasting Lipid Panel: Recent Labs  04/29/15 0120  CHOL 122  HDL 29*  LDLCALC 36  TRIG 284*  CHOLHDL 4.2   Thyroid Function Tests: Recent Labs  04/28/15 2049  TSH 1.792   TELE: 1st degree AV block with rate in 50's - 60's. No atopic events.       ECG: Sinus brady, 1st degree AV block,  no acute ST or T-wave changes.   Current Medications:  . amLODipine  5 mg Oral Daily   And  . benazepril  20 mg Oral Daily  . aspirin EC  81 mg Oral Daily  . carvedilol  6.25 mg Oral BID WC  . clopidogrel  75 mg Oral Daily  . ezetimibe  10 mg Oral Daily  . fenofibrate  160 mg Oral Daily  . insulin aspart  0-15 Units Subcutaneous TID WC  . meloxicam  15 mg Oral Daily  . sertraline  100 mg Oral Daily  . sodium chloride  3 mL Intravenous Q12H  . sodium chloride  3 mL Intravenous Q12H  . tiotropium  18 mcg Inhalation Daily   . sodium chloride    . heparin 1,400 Units/hr (04/28/15 2222)    ASSESSMENT AND PLAN:  1. Unstable angina - He saw his cardiothoracic surgeon about 3 weeks ago who did what sounds like a CT scan to monitor aorta. Per patient, the doctor told him  he had extensive coronary calcification in his coronary arteries that would require bypass surgery.  - Last cath was in 03/18/14 by Dr. Martinique which showed complex calcified disease in the RCA with a moderate stenosis at the origin of OM 2 and borderline disease in the mid LAD. The RCA had heavy calcification, tortuosity, and eccentric plaque and it was felt that PCI of the RCA would be complex.  - The patient is scheduled for LHC today. He was informed on admission if his cath shows diffuse disease not amendable to PCI, CTCS will need to be consulted.  - Troponin negative and ECG with no acute ST or TW changes at [facility]. Cyclic values have been negative. - Continue heparin gtt,ASA, plavix, and coreg. Continue Zetia (Intolerant to statins)  2. CAD - Previous diffuse 3 vessel with calcification - continue ASA, Plavix, BB, and Zetia.  3. HTN - BP well controlled.  - Continue amlodopine/benazepril   4. HLD - continue Zetia and fenofibrate  5. DM - Metformin held in anticipation of cath. Hold 48 hours following cath. - SSI while inpatient.  6. Thoracic aortic [facility] - [facility]. Stable per patient.  7. Carotid artery stenosis - Carotid Dopplers in October 2015 demonstrated 1-39% bilateral carotid artery stenosis.  - Continue aspirin and Plavix. Intolerant to statins.  Arna Medici , PA-C 8:32 AM 04/29/2015 Pager: [phone] Patient seen and examined and history reviewed. Agree with above findings and plan. Patient reports some twinges of pain. Otherwise stable. Has ruled out for MI. Plan cardiac cath today. Prior cath one year ago showed moderate 3 vessel CAD. RCA was most severe lesion and was poorly suited for PCI due to vessel tortuosity. If he has progressive disease by cath may need to consider CABG. will need to get results of CT from the [place].  Janashia Parco Martinique, [place] 04/29/2015 1:40 PM

## 2015-04-29 NOTE — H&P (View-Only) (Signed)
Patient Name: Eduardo [name] Date of Encounter: 04/29/2015  Active Problems:   CAD (coronary artery disease)   HTN (hypertension)   Hyperlipidemia   Type 2 diabetes mellitus without complication, without long-term current use of insulin (HCC)   COPD (chronic obstructive pulmonary disease) (HCC)   PTSD (post-traumatic stress disorder)   Unstable angina [facility])     Primary Cardiologist: Dr. Bronson Ing Patient Profile: 69 yo male w/ PMH of CAD (s/p multiple PTCAs in the past but no stents), thoracic aortic aneurysm, COPD, DM, and OSA who was transferred to [facility] from [facility] on 04/28/2015 for chest pain concerning for unstable angina.   SUBJECTIVE: Denies any recurrence in his chest pain overnight. Denies any palpitations or dyspnea. No remaining questions or concerns regarding his catheterization today.  OBJECTIVE Filed Vitals:   04/28/15 1838 04/28/15 2109 04/28/15 2300 04/29/15 0403  BP: 133/79 114/62 122/72 112/66  Pulse: 58 62 58 58  Temp: 97.7 F (36.5 C) 98.1 F (36.7 C) 98 F (36.7 C) 97.5 F (36.4 C)  TempSrc: Oral Oral Oral Oral  Resp: 18 18 18 18   Height: 5\' 8"  (1.727 m)     Weight: 186 lb 4.8 oz (84.505 kg)   195 lb 12.8 oz (88.814 kg)  SpO2: 97% 93% 93% 94%    Intake/Output Summary (Last 24 hours) at 04/29/15 [id number] Last data filed at 04/29/15 0600  Gross per 24 hour  Intake 549.67 ml  Output    400 ml  Net 149.67 ml   Filed Weights   04/28/15 1838 04/29/15 0403  Weight: 186 lb 4.8 oz (84.505 kg) 195 lb 12.8 oz (88.814 kg)    PHYSICAL EXAM General: Well developed, well nourished, male in no acute distress. Head: Normocephalic, atraumatic.  Neck: Supple without bruits, JVD not elevated. Lungs:  Resp regular and unlabored, CTA without wheezing or rales. Heart: RRR, S1, S2, no S3, S4, or murmur; no rub. Abdomen: Soft, non-tender, non-distended with normoactive bowel sounds. No hepatomegaly. No rebound/guarding. No obvious abdominal  masses. Extremities: No clubbing, cyanosis, or edema. Distal pedal pulses are 2+ bilaterally. Neuro: Alert and oriented X 3. Moves all extremities spontaneously. Psych: Normal affect.  LABS: CBC: Recent Labs  04/29/15 0714  WBC 5.9  HGB 14.3  HCT 42.5  MCV 92.8  PLT 197   INR: Recent Labs  04/29/15 0714  INR 0000000   Basic Metabolic Panel: Recent Labs  04/28/15 2049 04/29/15 0120 04/29/15 0714  NA  --  139 138  K  --  3.9 4.2  CL  --  105 105  CO2  --  26 25  GLUCOSE  --  124* 147*  BUN  --  18 18  CREATININE  --  0.78 0.75  CALCIUM  --  8.8* 8.8*  MG 1.5*  --   --    Liver Function Tests: Recent Labs  04/29/15 0120  AST 19  ALT 21  ALKPHOS 47  BILITOT 0.3  PROT 5.7*  ALBUMIN 3.6   Cardiac Enzymes: Recent Labs  04/28/15 2049 04/29/15 0120 04/29/15 0714  TROPONINI <0.03 <0.03 <0.03   Fasting Lipid Panel: Recent Labs  04/29/15 0120  CHOL 122  HDL 29*  LDLCALC 36  TRIG 284*  CHOLHDL 4.2   Thyroid Function Tests: Recent Labs  04/28/15 2049  TSH 1.792   TELE: 1st degree AV block with rate in 50's - 60's. No atopic events.       ECG: Sinus brady, 1st degree AV block,  no acute ST or T-wave changes.   Current Medications:  . amLODipine  5 mg Oral Daily   And  . benazepril  20 mg Oral Daily  . aspirin EC  81 mg Oral Daily  . carvedilol  6.25 mg Oral BID WC  . clopidogrel  75 mg Oral Daily  . ezetimibe  10 mg Oral Daily  . fenofibrate  160 mg Oral Daily  . insulin aspart  0-15 Units Subcutaneous TID WC  . meloxicam  15 mg Oral Daily  . sertraline  100 mg Oral Daily  . sodium chloride  3 mL Intravenous Q12H  . sodium chloride  3 mL Intravenous Q12H  . tiotropium  18 mcg Inhalation Daily   . sodium chloride    . heparin 1,400 Units/hr (04/28/15 2222)    ASSESSMENT AND PLAN:  1. Unstable angina - He saw his cardiothoracic surgeon about 3 weeks ago who did what sounds like a CT scan to monitor aorta. Per patient, the doctor told him  he had extensive coronary calcification in his coronary arteries that would require bypass surgery.  - Last cath was in 03/18/14 by Dr. Martinique which showed complex calcified disease in the RCA with a moderate stenosis at the origin of OM 2 and borderline disease in the mid LAD. The RCA had heavy calcification, tortuosity, and eccentric plaque and it was felt that PCI of the RCA would be complex.  - The patient is scheduled for LHC today. He was informed on admission if his cath shows diffuse disease not amendable to PCI, CTCS will need to be consulted.  - Troponin negative and ECG with no acute ST or TW changes at [facility]. Cyclic values have been negative. - Continue heparin gtt,ASA, plavix, and coreg. Continue Zetia (Intolerant to statins)  2. CAD - Previous diffuse 3 vessel with calcification - continue ASA, Plavix, BB, and Zetia.  3. HTN - BP well controlled.  - Continue amlodopine/benazepril   4. HLD - continue Zetia and fenofibrate  5. DM - Metformin held in anticipation of cath. Hold 48 hours following cath. - SSI while inpatient.  6. Thoracic aortic aneurysm - [facility]. Stable per patient.  7. Carotid artery stenosis - Carotid Dopplers in October 2015 demonstrated 1-39% bilateral carotid artery stenosis.  - Continue aspirin and Plavix. Intolerant to statins.  Arna Medici , PA-C 8:32 AM 04/29/2015 Pager: [phone] Patient seen and examined and history reviewed. Agree with above findings and plan. Patient reports some twinges of pain. Otherwise stable. Has ruled out for MI. Plan cardiac cath today. Prior cath one year ago showed moderate 3 vessel CAD. RCA was most severe lesion and was poorly suited for PCI due to vessel tortuosity. If he has progressive disease by cath may need to consider CABG. will need to get results of CT from the [place].  Kerrigan Glendening Martinique, [place] 04/29/2015 1:40 PM

## 2015-04-29 NOTE — Progress Notes (Signed)
ANTICOAGULATION CONSULT NOTE Pharmacy Consult for heparin  Indication: CP/unstable angina  Patient Measurements: Height: 5\' 8"  (172.7 cm) Weight: 195 lb 12.8 oz (88.814 kg) IBW/kg (Calculated) : 68.4  Vital Signs: Temp: 98 F (36.7 C) (11/30 0832) Temp Source: Oral (11/30 0832) BP: 126/68 mmHg (11/30 0832) Pulse Rate: 55 (11/30 0832)  Labs:  Recent Labs  04/28/15 2049 04/29/15 0120 04/29/15 0620 04/29/15 0714  HGB  --   --   --  14.3  HCT  --   --   --  42.5  PLT  --   --   --  197  LABPROT 14.2  --   --  14.5  INR 1.08  --   --  1.11  HEPARINUNFRC <0.10* 0.22* 0.15*  --   CREATININE  --  0.78  --  0.75  TROPONINI <0.03 <0.03  --  <0.03   Assessment: 69yoM transferred from [facility] hospital with CP and unstable angina. Planned LHC on 11/30.  HL low at 0.15  Goal of Therapy:  Heparin level 0.3-0.7 units/ml Monitor platelets by anticoagulation protocol: Yes   Plan:  Increase heparin to 1600 units / hr Follow up after cath  Thank you Anette Guarneri, PharmD [phone]  04/29/2015, 8:52 AM

## 2015-04-29 NOTE — Progress Notes (Signed)
Patient returned from cath lab. Alert and oriented, vitals WNL, and right radial site WNL. TR band in place. Patient verbalized understanding and precautions of TR band.

## 2015-04-29 NOTE — Progress Notes (Signed)
ANTICOAGULATION CONSULT NOTE Pharmacy Consult for heparin  Indication: CP/unstable angina  Patient Measurements: Height: 5\' 8"  (172.7 cm) Weight: 195 lb 12.8 oz (88.814 kg) IBW/kg (Calculated) : 68.4  Vital Signs: Temp: 97.7 F (36.5 C) (11/30 1127) Temp Source: Oral (11/30 1127) BP: 128/83 mmHg (11/30 1515) Pulse Rate: 100 (11/30 1515)  Labs:  Recent Labs  04/28/15 2049 04/29/15 0120 04/29/15 0620 04/29/15 0714  HGB  --   --   --  14.3  HCT  --   --   --  42.5  PLT  --   --   --  197  LABPROT 14.2  --   --  14.5  INR 1.08  --   --  1.11  HEPARINUNFRC <0.10* 0.22* 0.15*  --   CREATININE  --  0.78  --  0.75  TROPONINI <0.03 <0.03  --  <0.03   Assessment: 69yoM transferred from [facility] hospital with CP and unstable angina. Planned LHC on 11/30.  HL low this AM at 0.15  Now s/p cath - to resume heparin 8 hours post sheath removal at 2330 pm  Goal of Therapy:  Heparin level 0.3-0.7 units/ml Monitor platelets by anticoagulation protocol: Yes   Plan:  Resume heparin at 2330 pm at 1600 units / hr 730 am heparin level Daily CBC   Thank you Anette Guarneri, PharmD [phone]  04/29/2015, 3:29 PM

## 2015-04-29 NOTE — Interval H&P Note (Signed)
Cath Lab Visit (complete for each Cath Lab visit)  Clinical Evaluation Leading to the Procedure:   ACS: Yes.    Non-ACS:    Anginal Classification: CCS IV  Anti-ischemic medical therapy: Maximal Therapy (2 or more classes of medications)  Non-Invasive Test Results: No non-invasive testing performed  Prior CABG: No previous CABG   Complex calcified disease noted in the RCA from prior cath.   History and Physical Interval Note:  04/29/2015 2:17 PM  Eduardo [name]  has presented today for surgery, with the diagnosis of cp  The various methods of treatment have been discussed with the patient and family. After consideration of risks, benefits and other options for treatment, the patient has consented to  Procedure(s): Left Heart Cath and Coronary Angiography (N/A) as a surgical intervention .  The patient's history has been reviewed, patient examined, no change in status, stable for surgery.  I have reviewed the patient's chart and labs.  Questions were answered to the patient's satisfaction.     Josephmichael Lisenbee S.

## 2015-04-30 ENCOUNTER — Other Ambulatory Visit (HOSPITAL_COMMUNITY): Payer: TRICARE For Life (TFL)

## 2015-04-30 ENCOUNTER — Inpatient Hospital Stay (HOSPITAL_COMMUNITY): Payer: Medicare Other

## 2015-04-30 ENCOUNTER — Other Ambulatory Visit: Payer: Self-pay

## 2015-04-30 ENCOUNTER — Encounter (HOSPITAL_COMMUNITY): Payer: Self-pay | Admitting: Interventional Cardiology

## 2015-04-30 ENCOUNTER — Encounter: Payer: TRICARE For Life (TFL) | Admitting: *Deleted

## 2015-04-30 ENCOUNTER — Ambulatory Visit (HOSPITAL_COMMUNITY): Payer: Medicare Other

## 2015-04-30 ENCOUNTER — Ambulatory Visit: Payer: TRICARE For Life (TFL) | Admitting: Pediatrics

## 2015-04-30 DIAGNOSIS — I25110 Atherosclerotic heart disease of native coronary artery with unstable angina pectoris: Secondary | ICD-10-CM

## 2015-04-30 LAB — GLUCOSE, CAPILLARY
GLUCOSE-CAPILLARY: 149 mg/dL — AB (ref 65–99)
Glucose-Capillary: 124 mg/dL — ABNORMAL HIGH (ref 65–99)
Glucose-Capillary: 183 mg/dL — ABNORMAL HIGH (ref 65–99)
Glucose-Capillary: 150 mg/dL — ABNORMAL HIGH (ref 65–99)

## 2015-04-30 LAB — HEPARIN LEVEL (UNFRACTIONATED)
Heparin Unfractionated: 0.22 IU/mL — ABNORMAL LOW (ref 0.30–0.70)
Heparin Unfractionated: 0.25 IU/mL — ABNORMAL LOW (ref 0.30–0.70)

## 2015-04-30 LAB — CBC
HCT: 43.1 % (ref 39.0–52.0)
Hemoglobin: 14.6 g/dL (ref 13.0–17.0)
MCH: 31.5 pg (ref 26.0–34.0)
MCHC: 33.9 g/dL (ref 30.0–36.0)
MCV: 92.9 fL (ref 78.0–100.0)
PLATELETS: 188 10*3/uL (ref 150–400)
RBC: 4.64 MIL/uL (ref 4.22–5.81)
RDW: 13 % (ref 11.5–15.5)
WBC: 5.1 10*3/uL (ref 4.0–10.5)

## 2015-04-30 LAB — PLATELET INHIBITION P2Y12: Platelet Function  P2Y12: 222 [PRU] (ref 194–418)

## 2015-04-30 MED ORDER — CARVEDILOL 6.25 MG PO TABS: 6.2500 mg | ORAL_TABLET | Freq: Two times a day (BID) | ORAL | Status: DC

## 2015-04-30 MED ORDER — FENOFIBRATE MICRONIZED 134 MG PO CAPS: ORAL_CAPSULE | ORAL | Status: DC

## 2015-04-30 NOTE — Progress Notes (Signed)
ANTICOAGULATION CONSULT NOTE Pharmacy Consult for heparin  Indication: CP/unstable angina  Patient Measurements: Height: 5\' 8"  (172.7 cm) Weight: 191 lb 8 oz (86.864 kg) (Scale A) IBW/kg (Calculated) : 68.4  Vital Signs: Temp: 97.9 F (36.6 C) (12/01 0550) Temp Source: Oral (12/01 0550) BP: 124/74 mmHg (12/01 0550) Pulse Rate: 59 (12/01 0550)  Labs:  Recent Labs  04/28/15 2049 04/29/15 0120 04/29/15 0620 04/29/15 0714 04/30/15 0827  HGB  --   --   --  14.3 14.6  HCT  --   --   --  42.5 43.1  PLT  --   --   --  197 188  LABPROT 14.2  --   --  14.5  --   INR 1.08  --   --  1.11  --   HEPARINUNFRC <0.10* 0.22* 0.15*  --  0.22*  CREATININE  --  0.78  --  0.75  --   TROPONINI <0.03 <0.03  --  <0.03  --    Assessment: 69yoM transferred from [facility] hospital with CP and unstable angina. LHC on 11/30, now plan for CABG on Monday.  HL low this AM at 0.22 on 1600 units/hr.   Goal of Therapy:  Heparin level 0.3-0.7 units/ml Monitor platelets by anticoagulation protocol: Yes   Plan:  Increase heparin to 1800 units / hr F/u  hour HL Daily CBC, HL, monitor s/sx bleeding  Joya San, PharmD Clinical Pharmacy Resident Pager # [phone] 04/30/2015 9:43 AM

## 2015-04-30 NOTE — Progress Notes (Signed)
[id number] Offered to walk with pt and he stated he had walked in hallway twice already and would walk again later. Reviewed importance of activity and IS after surgery. Discussed with pt sternal precautions and showed pt how to get up and down without use of arms. Gave OHS booklet, care guide and wrote down how to view pre op video. IS not on floor so asked staff to order pt one to start using. Pt stated he had family to take care of him after discharge. Pt stated no CP when up in hall. Graylon Good RN BSN 04/30/2015 2:15 PM

## 2015-04-30 NOTE — Progress Notes (Signed)
Patient Name: Eduardo [name] Date of Encounter: 04/30/2015  Active Problems:   CAD (coronary artery disease)   HTN (hypertension)   Hyperlipidemia   Type 2 diabetes mellitus without complication, without long-term current use of insulin (HCC)   COPD (chronic obstructive pulmonary disease) (HCC)   PTSD (post-traumatic stress disorder)   Unstable angina [facility])     Primary Cardiologist: Dr. Bronson Ing Patient Profile: 69 yo male w/ PMH of CAD (s/p multiple PTCAs in the past but no stents), thoracic aortic aneurysm, COPD, DM, and OSA who was transferred to [facility] from [facility] on 04/28/2015 for chest pain consistent with  unstable angina.   SUBJECTIVE: Denies any recurrence in his chest pain overnight. Denies any palpitations or dyspnea. Concerned about his elevated blood sugar. Reports he is always able to control it with diet at home.   OBJECTIVE Filed Vitals:   04/29/15 2122 04/30/15 0008 04/30/15 0550 04/30/15 0740  BP: 130/71 128/69 124/74   Pulse: 65 68 59   Temp: 97.9 F (36.6 C) 97.8 F (36.6 C) 97.9 F (36.6 C)   TempSrc: Oral Oral Oral   Resp: 18 18 16    Height:      Weight:   86.864 kg (191 lb 8 oz)   SpO2: 93% 97% 93% 94%    Intake/Output Summary (Last 24 hours) at 04/30/15 0914 Last data filed at 04/30/15 0849  Gross per 24 hour  Intake 2211.99 ml  Output    775 ml  Net 1436.99 ml   Filed Weights   04/28/15 1838 04/29/15 0403 04/30/15 0550  Weight: 84.505 kg (186 lb 4.8 oz) 88.814 kg (195 lb 12.8 oz) 86.864 kg (191 lb 8 oz)    PHYSICAL EXAM General: Well developed, well nourished, male in no acute distress. Head: Normocephalic, atraumatic.  Neck: Supple without bruits, JVD not elevated. Lungs:  Resp regular and unlabored, CTA without wheezing or rales. Heart: RRR, S1, S2, no S3, S4, or murmur; no rub. Abdomen: Soft, non-tender, non-distended with normoactive bowel sounds. No hepatomegaly. No rebound/guarding. No obvious abdominal  masses. Extremities: No clubbing, cyanosis, or edema. Distal pedal pulses are 2+ bilaterally. Neuro: Alert and oriented X 3. Moves all extremities spontaneously. Psych: Normal affect.  LABS: CBC:  Recent Labs  04/29/15 0714 04/30/15 0827  WBC 5.9 5.1  HGB 14.3 14.6  HCT 42.5 43.1  MCV 92.8 92.9  PLT 197 188   INR:  Recent Labs  04/29/15 0714  INR 0000000   Basic Metabolic Panel:  Recent Labs  04/28/15 2049 04/29/15 0120 04/29/15 0714  NA  --  139 138  K  --  3.9 4.2  CL  --  105 105  CO2  --  26 25  GLUCOSE  --  124* 147*  BUN  --  18 18  CREATININE  --  0.78 0.75  CALCIUM  --  8.8* 8.8*  MG 1.5*  --   --    Liver Function Tests:  Recent Labs  04/29/15 0120  AST 19  ALT 21  ALKPHOS 47  BILITOT 0.3  PROT 5.7*  ALBUMIN 3.6   Cardiac Enzymes:  Recent Labs  04/28/15 2049 04/29/15 0120 04/29/15 0714  TROPONINI <0.03 <0.03 <0.03   Fasting Lipid Panel:  Recent Labs  04/29/15 0120  CHOL 122  HDL 29*  LDLCALC 36  TRIG 284*  CHOLHDL 4.2   Thyroid Function Tests:  Recent Labs  04/28/15 2049  TSH 1.792   TELE: 1st degree AV block  with rate in 50's - 60's. No atopic events.       ECG: Sinus brady, 1st degree AV block, no acute ST or T-wave changes.   Procedures    Left Heart Cath and Coronary Angiography    Conclusion     Prox RCA lesion, 50% stenosed, heavily calcified and eccentric.  Mid RCA lesion, 70% stenosed, heavily calcified and eccentric.  Mid Cx lesion at ostium of OM2, 75% stenosed- seen best in the cranial view and noted on 2015 cath.  Mid LAD lesion, 80% stenosed- progressed since the 2015 cath.  Ost 2nd Diag to 2nd Diag lesion, 60% stenosed.  The left ventricular systolic function is normal.  Discussed with the inpatient team (Dr. Martinique). Patient with thoracic aneurysm of unknown size. Given how difficult RCA PCI would be due to calcification and tortuosity, will get cardiac surgery evaluation.   Stop  clopidogrel at this time. Resume heparin 8 hours post sheath pull.     Indications    Coronary artery disease involving native coronary artery of native heart with unstable angina pectoris ([place]) [I25.110 (ICD-10-CM)]    Technique and Indications    The risks, benefits, and details of the procedure were explained to the patient. The patient verbalized understanding and wanted to proceed. Informed written consent was obtained.  PROCEDURE TECHNIQUE: After Xylocaine anesthesia a 23F slender sheath was placed in the right radial artery with a single anterior needle wall stick. IV Heparin was given. Right coronary angiography was done using a Judkins R4 guide catheter. Left coronary angiography was done using a Judkins L3.5 guide catheter. Left ventriculography was done using a pigtail catheter. A TR band was used for hemostasis.  Contrast: 80 cc  Estimated blood loss <50 mL. There were no immediate complications during the procedure.    Coronary Findings    Dominance: Right   Left Anterior Descending   . Mid LAD lesion, 80% stenosed.   . Second Diagonal Branch   . Ost 2nd Diag to 2nd Diag lesion, 60% stenosed.     Left Circumflex   . Mid Cx lesion, 75% stenosed.     Right Coronary Artery  . Vessel is large. There is mild the vessel.   . Prox RCA lesion, 50% stenosed. Calcified.   . Mid RCA lesion, 70% stenosed. Calcified.      Wall Motion                 Left Heart    Left Ventricle The left ventricular size is normal. The left ventricular systolic function is normal. The left ventricular ejection fraction is 55-65% by visual estimate. There are no wall motion abnormalities in the left ventricle.   Aortic Valve There is no aortic valve stenosis.    Coronary Diagrams    Diagnostic Diagram              Current Medications:  . amLODipine  5 mg Oral Daily   And  . benazepril  20 mg Oral Daily  . aspirin EC  81 mg Oral Daily  . carvedilol  6.25 mg  Oral BID WC  . ezetimibe  10 mg Oral Daily  . fenofibrate  160 mg Oral Daily  . insulin aspart  0-15 Units Subcutaneous TID WC  . sertraline  100 mg Oral Daily  . sodium chloride  3 mL Intravenous Q12H  . sodium chloride  3 mL Intravenous Q12H  . tiotropium  18 mcg Inhalation Daily   . heparin 1,600 Units/hr (04/29/15  2329)    ASSESSMENT AND PLAN:  1. Unstable angina - Troponin negative and ECG with no acute ST or TW changes at [facility]. Cyclic values have been negative. - Continue heparin gtt,ASA, and coreg. Continue Zetia (Intolerant to statins) --cardiac cath shows complex 3 vessel CAD. Anatomy poorly suited for PCI. Appreciate Dr. Everrett Coombe input. CT from [place] requested to assess aorta. plavix on hold. Plan surgery Monday. Echo today.  2. CAD - Diffuse 3 vessel with calcification - continue ASA, Plavix, BB, and Zetia.  3. HTN - BP well controlled.  - Continue amlodopine/benazepril   4. HLD - continue Zetia and fenofibrate  5. DM - Metformin held in anticipation of cath. Hold 48 hours following cath. - SSI while inpatient. --BS may also be higher with physiologic stress.   6. Thoracic aortic aneurysm - awaiting CT from [place].  7. Carotid artery stenosis - Carotid Dopplers in October 2015 demonstrated 1-39% bilateral carotid artery stenosis.  - Continue aspirin and Plavix. Intolerant to statins.  Signed,   Peter Martinique, [place] 04/30/2015 9:14 AM

## 2015-04-30 NOTE — Progress Notes (Signed)
ANTICOAGULATION CONSULT NOTE - Follow Up Consult  Pharmacy Consult for heparin Indication: CP / angina  Allergies  Allergen Reactions  . Statins Other (See Comments)    Body aches    Patient Measurements: Height: 5\' 8"  (172.7 cm) Weight: 191 lb 8 oz (86.864 kg) (Scale A) IBW/kg (Calculated) : 68.4 Heparin Dosing Weight: 86.9 kg  Vital Signs: Temp: 98.1 F (36.7 C) (12/01 1500) Temp Source: Oral (12/01 1500) BP: 122/64 mmHg (12/01 1500) Pulse Rate: 60 (12/01 1500)  Labs:  Recent Labs  04/28/15 2049 04/29/15 0120 04/29/15 0620 04/29/15 0714 04/30/15 0827 04/30/15 1556  HGB  --   --   --  14.3 14.6  --   HCT  --   --   --  42.5 43.1  --   PLT  --   --   --  197 188  --   LABPROT 14.2  --   --  14.5  --   --   INR 1.08  --   --  1.11  --   --   HEPARINUNFRC <0.10* 0.22* 0.15*  --  0.22* 0.25*  CREATININE  --  0.78  --  0.75  --   --   TROPONINI <0.03 <0.03  --  <0.03  --   --     Estimated Creatinine Clearance: 92.1 mL/min (by C-G formula based on Cr of 0.75).   Medications:  Scheduled:  . amLODipine  5 mg Oral Daily   And  . benazepril  20 mg Oral Daily  . aspirin EC  81 mg Oral Daily  . carvedilol  6.25 mg Oral BID WC  . ezetimibe  10 mg Oral Daily  . fenofibrate  160 mg Oral Daily  . insulin aspart  0-15 Units Subcutaneous TID WC  . sertraline  100 mg Oral Daily  . sodium chloride  3 mL Intravenous Q12H  . sodium chloride  3 mL Intravenous Q12H  . tiotropium  18 mcg Inhalation Daily   Infusions:  . heparin 1,800 Units/hr (04/30/15 0955)    Assessment: 69 yo male with chest pain and unstable angina is currently on subtherapeutic heparin.  Heparin level is 0.25 Goal of Therapy:  Heparin level 0.3-0.7 units/ml Monitor platelets by anticoagulation protocol: Yes   Plan:  - increase heparin to 1950 units/hr - 8 hr heparin level  Bethzaida Boord, Tsz-Yin 04/30/2015,4:55 PM

## 2015-04-30 NOTE — Progress Notes (Signed)
Patient ID: Eduardo [name], male   DOB: 12/19/1945, 69 y.o.   MRN: [id number]      [address]       [place] [address]             [phone]                 1 Day Post-Op Procedure(s) (LRB): Left Heart Cath and Coronary Angiography (N/A)  LOS: 2 days   Subjective: Feels ok, no chest pain  Objective: BP 124/74 mmHg  Pulse 59  Temp(Src) 97.9 F (36.6 C) (Oral)  Resp 16  Ht 5\' 8"  (1.727 m)  Wt 191 lb 8 oz (86.864 kg)  BMI 29.12 kg/m2  SpO2 94%     Filed Weights   04/28/15 1838 04/29/15 0403 04/30/15 0550  Weight: 186 lb 4.8 oz (84.505 kg) 195 lb 12.8 oz (88.814 kg) 191 lb 8 oz (86.864 kg)    Hemodynamic parameters for last 24 hours:    Intake/Output from previous day: 11/30 0701 - 12/01 0700 In: 1612 [P.O.:960; I.V.:652] Out: 775 [Urine:775] Intake/Output this shift:    Scheduled Meds: . amLODipine  5 mg Oral Daily   And  . benazepril  20 mg Oral Daily  . aspirin EC  81 mg Oral Daily  . carvedilol  6.25 mg Oral BID WC  . ezetimibe  10 mg Oral Daily  . fenofibrate  160 mg Oral Daily  . insulin aspart  0-15 Units Subcutaneous TID WC  . sertraline  100 mg Oral Daily  . sodium chloride  3 mL Intravenous Q12H  . sodium chloride  3 mL Intravenous Q12H  . tiotropium  18 mcg Inhalation Daily   Continuous Infusions: . heparin 1,600 Units/hr (04/29/15 2329)   PRN Meds:.sodium chloride, sodium chloride, acetaminophen, albuterol, nitroGLYCERIN, nitroGLYCERIN, ondansetron (ZOFRAN) IV, sodium chloride, sodium chloride  General appearance: alert and cooperative Neurologic: intact Heart: regular rate and rhythm, S1, S2 normal, no murmur, click, rub or gallop Lungs: clear to auscultation bilaterally Abdomen: soft, non-tender; bowel sounds normal; no masses,  no organomegaly Extremities: extremities normal, atraumatic, no cyanosis or edema  Lab Results: CBC: Recent Labs  04/29/15 0714  WBC 5.9  HGB 14.3  HCT 42.5  PLT 197   BMET:    Recent Labs  04/29/15 0120 04/29/15 0714  NA 139 138  K 3.9 4.2  CL 105 105  CO2 26 25  GLUCOSE 124* 147*  BUN 18 18  CREATININE 0.78 0.75  CALCIUM 8.8* 8.8*    PT/INR:  Recent Labs  04/29/15 0714  LABPROT 14.5  INR 1.11     Radiology Dg Chest Port 1 View  04/30/2015  CLINICAL DATA:  Chest pain upon admission EXAM: PORTABLE CHEST 1 VIEW COMPARISON:  Meet 2 days ago FINDINGS: Normal heart size and mediastinal contours. No acute infiltrate or edema. No effusion or pneumothorax. Paraseptal emphysematous changes at the apices. No acute osseous findings. IMPRESSION: No evidence of acute disease. Electronically Signed   By: Monte Fantasia M.D.   On: 04/30/2015 07:20   Patient has supplied me record of CT done 915/2016: At level of sinus of valsalva 4.6 cm, 3.1 cm at annulus, 4.2 at sinotubular ridge , 3.9 cm at mid ascending aorta, [facility]  Echo pending  [id number] test sent to [facility] due to machine malfunction here.  Assessment/Plan: S/P Procedure(s) (LRB): Left Heart Cath and Coronary Angiography (N/A) cabg next week after wash out of Plavix, last dose  given yesterday and obtaining ct from [place]   [name] MD 04/30/2015 8:27 AM

## 2015-05-01 ENCOUNTER — Ambulatory Visit (HOSPITAL_COMMUNITY): Payer: Medicare Other

## 2015-05-01 ENCOUNTER — Inpatient Hospital Stay (HOSPITAL_COMMUNITY): Payer: TRICARE For Life (TFL)

## 2015-05-01 ENCOUNTER — Inpatient Hospital Stay (HOSPITAL_COMMUNITY): Payer: Medicare Other

## 2015-05-01 ENCOUNTER — Other Ambulatory Visit: Payer: Self-pay | Admitting: *Deleted

## 2015-05-01 DIAGNOSIS — R079 Chest pain, unspecified: Secondary | ICD-10-CM

## 2015-05-01 DIAGNOSIS — J439 Emphysema, unspecified: Secondary | ICD-10-CM

## 2015-05-01 DIAGNOSIS — I25110 Atherosclerotic heart disease of native coronary artery with unstable angina pectoris: Secondary | ICD-10-CM

## 2015-05-01 LAB — GLUCOSE, CAPILLARY
Glucose-Capillary: 156 mg/dL — ABNORMAL HIGH (ref 65–99)
Glucose-Capillary: 172 mg/dL — ABNORMAL HIGH (ref 65–99)
Glucose-Capillary: 124 mg/dL — ABNORMAL HIGH (ref 65–99)
Glucose-Capillary: 151 mg/dL — ABNORMAL HIGH (ref 65–99)

## 2015-05-01 LAB — PULMONARY FUNCTION TEST
DL/VA % pred: 126 %
DL/VA: 5.38 ml/min/mmHg/L
DLCO cor % pred: 89 %
DLCO cor: 22.93 ml/min/mmHg
DLCO unc % pred: 88 %
DLCO unc: 22.6 ml/min/mmHg
FEF 25-75 Post: 0.87 L/sec
FEF 25-75 Pre: 0.81 L/sec
FEF2575-%Change-Post: 6 %
FEF2575-%Pred-Post: 43 %
FEF2575-%Pred-Pre: 40 %
FEV1-%Change-Post: -1 %
FEV1-%Pred-Post: 72 %
FEV1-%Pred-Pre: 73 %
FEV1-Post: 1.9 L
FEV1-Pre: 1.92 L
FEV1FVC-%Change-Post: -2 %
FEV1FVC-%Pred-Pre: 84 %
FEV6-%Change-Post: 2 %
FEV6-%Pred-Post: 87 %
FEV6-%Pred-Pre: 85 %
FEV6-Post: 2.95 L
FEV6-Pre: 2.89 L
FEV6FVC-%Change-Post: 1 %
FEV6FVC-%Pred-Post: 101 %
FEV6FVC-%Pred-Pre: 100 %
FVC-%Change-Post: 1 %
FVC-%Pred-Post: 86 %
FVC-%Pred-Pre: 85 %
FVC-Post: 3.11 L
FVC-Pre: 3.07 L
Post FEV1/FVC ratio: 61 %
Post FEV6/FVC ratio: 95 %
Pre FEV1/FVC ratio: 63 %
Pre FEV6/FVC Ratio: 94 %
RV % pred: 107 %
RV: 2.35 L
TLC % pred: 98 %
TLC: 5.94 L

## 2015-05-01 LAB — CBC
HEMATOCRIT: 42.2 % (ref 39.0–52.0)
Hemoglobin: 14.1 g/dL (ref 13.0–17.0)
MCH: 31.3 pg (ref 26.0–34.0)
MCHC: 33.4 g/dL (ref 30.0–36.0)
MCV: 93.8 fL (ref 78.0–100.0)
Platelets: 186 10*3/uL (ref 150–400)
RBC: 4.5 MIL/uL (ref 4.22–5.81)
RDW: 13 % (ref 11.5–15.5)
WBC: 5.2 10*3/uL (ref 4.0–10.5)

## 2015-05-01 LAB — HEPARIN LEVEL (UNFRACTIONATED)
Heparin Unfractionated: 0.34 IU/mL (ref 0.30–0.70)
Heparin Unfractionated: 0.28 IU/mL — ABNORMAL LOW (ref 0.30–0.70)

## 2015-05-01 MED ORDER — ALBUTEROL SULFATE (2.5 MG/3ML) 0.083% IN NEBU
2.5000 mg | INHALATION_SOLUTION | Freq: Once | RESPIRATORY_TRACT | Status: AC
  Administered 2015-05-01: 2.5 mg via RESPIRATORY_TRACT

## 2015-05-01 NOTE — Progress Notes (Signed)
ANTICOAGULATION CONSULT NOTE - Follow Up Consult  Pharmacy Consult for heparin Indication: USAP   Labs:  Recent Labs  04/28/15 2049 04/29/15 0120  04/29/15 0714 04/30/15 0827 04/30/15 1556 05/01/15 0135  HGB  --   --   < > 14.3 14.6  --  14.1  HCT  --   --   --  42.5 43.1  --  42.2  PLT  --   --   --  197 188  --  186  LABPROT 14.2  --   --  14.5  --   --   --   INR 1.08  --   --  1.11  --   --   --   HEPARINUNFRC <0.10* 0.22*  < >  --  0.22* 0.25* 0.34  CREATININE  --  0.78  --  0.75  --   --   --   TROPONINI <0.03 <0.03  --  <0.03  --   --   --   < > = values in this interval not displayed.    Assessment/Plan:  69yo male therapeutic on heparin after multiple rate adjustments. Will continue gtt at current rate and confirm stable with additional level.   Wynona Neat, PharmD, BCPS  05/01/2015,2:48 AM

## 2015-05-01 NOTE — Care Management Important Message (Signed)
Important Message  Patient Details  Name: Eduardo [name] MRN: [id number] Date of Birth: 05/07/1946   Medicare Important Message Given:  Yes    [name] Eduardo [name] 05/01/2015, 2:01 PM

## 2015-05-01 NOTE — Progress Notes (Signed)
ANTICOAGULATION CONSULT NOTE Pharmacy Consult for heparin  Indication: CP/unstable angina -> awaiting CABG  Patient Measurements: Height: 5\' 8"  (172.7 cm) Weight: 186 lb 4.8 oz (84.505 kg) IBW/kg (Calculated) : 68.4  Vital Signs: Temp: 98.1 F (36.7 C) (12/02 0735) Temp Source: Oral (12/02 0735) BP: 146/70 mmHg (12/02 0735) Pulse Rate: 56 (12/02 0735)  Labs:  Recent Labs  04/28/15 2049 04/29/15 0120  04/29/15 0714 04/30/15 0827 04/30/15 1556 05/01/15 0135 05/01/15 0931  HGB  --   --   < > 14.3 14.6  --  14.1  --   HCT  --   --   --  42.5 43.1  --  42.2  --   PLT  --   --   --  197 188  --  186  --   LABPROT 14.2  --   --  14.5  --   --   --   --   INR 1.08  --   --  1.11  --   --   --   --   HEPARINUNFRC <0.10* 0.22*  < >  --  0.22* 0.25* 0.34 0.28*  CREATININE  --  0.78  --  0.75  --   --   --   --   TROPONINI <0.03 <0.03  --  <0.03  --   --   --   --   < > = values in this interval not displayed.   Assessment: 69yoM transferred [facility] hospital with CP and unstable angina.  Awaiting CABG on Monday HL nearly therapeutic, CBC stable  Goal of Therapy:  Heparin level 0.3-0.7 units/ml Monitor platelets by anticoagulation protocol: Yes   Plan:  Heparin to 2050 units / hr Follow up AM labs  Thank you Anette Guarneri, PharmD [phone]  05/01/2015, 10:04 AM

## 2015-05-01 NOTE — Progress Notes (Signed)
[address]       [place] [address]             [phone]                 2 Days Post-Op Procedure(s) (LRB): Left Heart Cath and Coronary Angiography (N/A)  LOS: 3 days   Subjective: No chest pain , echo done today   Objective: Vital signs in last 24 hours: Patient Vitals for the past 24 hrs:  BP Temp Temp src Pulse Resp SpO2 Weight  05/01/15 1156 139/76 mmHg 98 F (36.7 C) Oral 89 20 94 % -  05/01/15 0735 (!) 146/70 mmHg 98.1 F (36.7 C) Oral (!) 56 20 - -  05/01/15 0446 (!) 148/82 mmHg 97.6 F (36.4 C) Oral (!) 58 (!) 21 95 % 186 lb 4.8 oz (84.505 kg)  04/30/15 2208 139/71 mmHg 98.4 F (36.9 C) Oral (!) 59 18 95 % -    Filed Weights   04/29/15 0403 04/30/15 0550 05/01/15 0446  Weight: 195 lb 12.8 oz (88.814 kg) 191 lb 8 oz (86.864 kg) 186 lb 4.8 oz (84.505 kg)    Hemodynamic parameters for last 24 hours:    Intake/Output from previous day: 12/01 0701 - 12/02 0700 In: 960 [P.O.:960] Out: 800 [Urine:800] Intake/Output this shift: Total I/O In: 480 [P.O.:480] Out: -   Scheduled Meds: . amLODipine  5 mg Oral Daily   And  . benazepril  20 mg Oral Daily  . aspirin EC  81 mg Oral Daily  . carvedilol  6.25 mg Oral BID WC  . ezetimibe  10 mg Oral Daily  . fenofibrate  160 mg Oral Daily  . insulin aspart  0-15 Units Subcutaneous TID WC  . sertraline  100 mg Oral Daily  . sodium chloride  3 mL Intravenous Q12H  . sodium chloride  3 mL Intravenous Q12H  . tiotropium  18 mcg Inhalation Daily   Continuous Infusions: . heparin 2,050 Units/hr (05/01/15 1007)   PRN Meds:.sodium chloride, sodium chloride, acetaminophen, albuterol, nitroGLYCERIN, nitroGLYCERIN, ondansetron (ZOFRAN) IV, sodium chloride, sodium chloride  General appearance: alert and cooperative Neurologic: intact Heart: regular rate and rhythm, S1, S2 normal, no murmur, click, rub or gallop Lungs: clear to auscultation bilaterally Abdomen: soft, non-tender; bowel sounds  normal; no masses,  no organomegaly Extremities: extremities normal, atraumatic, no cyanosis or edema and Homans sign is negative, no sign of DVT  Lab Results: CBC: Recent Labs  04/30/15 0827 05/01/15 0135  WBC 5.1 5.2  HGB 14.6 14.1  HCT 43.1 42.2  PLT 188 186   BMET:  Recent Labs  04/29/15 0120 04/29/15 0714  NA 139 138  K 3.9 4.2  CL 105 105  CO2 26 25  GLUCOSE 124* 147*  BUN 18 18  CREATININE 0.78 0.75  CALCIUM 8.8* 8.8*    PT/INR:  Recent Labs  04/29/15 0714  LABPROT 14.5  INR 1.11     Radiology Dg Chest Port 1 View  04/30/2015  CLINICAL DATA:  Chest pain upon admission EXAM: PORTABLE CHEST 1 VIEW COMPARISON:  Meet 2 days ago FINDINGS: Normal heart size and mediastinal contours. No acute infiltrate or edema. No effusion or pneumothorax. Paraseptal emphysematous changes at the apices. No acute osseous findings. IMPRESSION: No evidence of acute disease. Electronically Signed   By: Monte Fantasia M.D.   On: 04/30/2015 07:20   Echo: Study Conclusions  - Left ventricle: The cavity size was normal. There was  mild concentric hypertrophy. Systolic function was normal. The estimated ejection fraction was in the range of 55% to 60%. Probable mild hypokinesis of the basal-midinferior and inferoseptal myocardium. Left ventricular diastolic function parameters were normal. - Aortic valve: There was mild regurgitation.  Assessment/Plan: S/P Procedure(s) (LRB): Left Heart Cath and Coronary Angiography (N/A) [facility] report[facility], if does not come will get ct of chest non contrast Plan  The goals risks and alternatives of the planned surgical procedure Cabg Monday, poss replacement of ascending aorta have been discussed with the patient in detail. The risks of the procedure including death, infection, stroke, myocardial infarction, bleeding, blood transfusion have all been discussed specifically.  I have quoted Eduardo [name] a 3 % of perioperative mortality and a complication rate as high as 30 %. The patient's questions have been answered.Eduardo [name] is willing  to proceed with the planned procedure.   In addition to other potential risks and complications from the surgery, I have made the patient aware of the recent [place] concerning the risk of infection by Myocobacterium chimaera related to the use of Stockert 3T heater-cooler equipment during cardiac surgery. I discussed with the patient the low risk of infection, as well as our compliance with the most current FDA recommendations to minimize infection and testing of all devices for contamination. The patient has been made aware of the limited alternatives to immediately replacing the current equipment. The patient has been informed regarding the risks associated with waiting to proceed with needed surgery and that such risks are greater than the risk of infection related to the use of the heater-cooler device. I did make the patient aware that after careful review of the patients having cardiac surgery at [facility] we have no evidence that heater/cooler related infections have occurred at [facility]. We discussed that this is a slow-growing bacterium, such that it can take some period of time for symptoms to develop.     [name] MD 05/01/2015 3:55 PM

## 2015-05-01 NOTE — Progress Notes (Signed)
Patient Name: Eduardo [name] Date of Encounter: 05/01/2015  Active Problems:   CAD (coronary artery disease)   HTN (hypertension)   Hyperlipidemia   Type 2 diabetes mellitus without complication, without long-term current use of insulin (HCC)   COPD (chronic obstructive pulmonary disease) (HCC)   PTSD (post-traumatic stress disorder)   Unstable angina [facility])   Primary Cardiologist: Dr. Bronson Ing Patient Profile: 69 yo male w/ PMH of CAD (s/p multiple PTCAs in the past but no stents), thoracic aortic aneurysm, COPD, DM, and OSA who was transferred to [facility] from [facility] on 04/28/2015 for chest pain concerning for unstable angina.   SUBJECTIVE: Denies any chest pain. Had PFT's this morning in preparation for CABG. Not pleased about having to have blood drawn several times per day.  OBJECTIVE Filed Vitals:   04/30/15 1500 04/30/15 2208 05/01/15 0446 05/01/15 0735  BP: 122/64 139/71 148/82 146/70  Pulse: 60 59 58 56  Temp: 98.1 F (36.7 C) 98.4 F (36.9 C) 97.6 F (36.4 C) 98.1 F (36.7 C)  TempSrc: Oral Oral Oral Oral  Resp:  18 21 20   Height:      Weight:   186 lb 4.8 oz (84.505 kg)   SpO2: 95% 95% 95%     Intake/Output Summary (Last 24 hours) at 05/01/15 0902 Last data filed at 04/30/15 2208  Gross per 24 hour  Intake    360 ml  Output    800 ml  Net   -440 ml   Filed Weights   04/29/15 0403 04/30/15 0550 05/01/15 0446  Weight: 195 lb 12.8 oz (88.814 kg) 191 lb 8 oz (86.864 kg) 186 lb 4.8 oz (84.505 kg)    PHYSICAL EXAM General: Well developed, well nourished, male in no acute distress. Head: Normocephalic, atraumatic.  Neck: Supple without bruits, JVD not elevated. Lungs:  Resp regular and unlabored, CTA without wheezing or rales. Heart: RRR, S1, S2, no S3, S4, or murmur; no rub. Abdomen: Soft, non-tender, non-distended with normoactive bowel sounds. No hepatomegaly. No rebound/guarding. No obvious abdominal masses. Extremities: No clubbing,  cyanosis, or edema. Distal pedal pulses are 2+ bilaterally. Right radial cath site without ecchymosis, evidence of hematoma, or bruit. Neuro: Alert and oriented X 3. Moves all extremities spontaneously. Psych: Normal affect.  LABS: CBC: Recent Labs  04/30/15 0827 05/01/15 0135  WBC 5.1 5.2  HGB 14.6 14.1  HCT 43.1 42.2  MCV 92.9 93.8  PLT 188 186   INR: Recent Labs  04/29/15 0714  INR 0000000   Basic Metabolic Panel: Recent Labs  04/28/15 2049 04/29/15 0120 04/29/15 0714  NA  --  139 138  K  --  3.9 4.2  CL  --  105 105  CO2  --  26 25  GLUCOSE  --  124* 147*  BUN  --  18 18  CREATININE  --  0.78 0.75  CALCIUM  --  8.8* 8.8*  MG 1.5*  --   --    Liver Function Tests: Recent Labs  04/29/15 0120  AST 19  ALT 21  ALKPHOS 47  BILITOT 0.3  PROT 5.7*  ALBUMIN 3.6   Cardiac Enzymes: Recent Labs  04/28/15 2049 04/29/15 0120 04/29/15 0714  TROPONINI <0.03 <0.03 <0.03   Hemoglobin A1C: Recent Labs  04/28/15 2049  HGBA1C 7.3*   Fasting Lipid Panel: Recent Labs  04/29/15 0120  CHOL 122  HDL 29*  LDLCALC 36  TRIG 284*  CHOLHDL 4.2   Thyroid Function Tests: Recent Labs  04/28/15 2049  TSH 1.792   TELE:   NSR with rate in 50's - 60's.     ECHO: Pending.  Radiology/Studies: Dg Chest Port 1 View: 04/30/2015  CLINICAL DATA:  Chest pain upon admission EXAM: PORTABLE CHEST 1 VIEW COMPARISON:  Meet 2 days ago FINDINGS: Normal heart size and mediastinal contours. No acute infiltrate or edema. No effusion or pneumothorax. Paraseptal emphysematous changes at the apices. No acute osseous findings. IMPRESSION: No evidence of acute disease. Electronically Signed   By: Monte Fantasia M.D.   On: 04/30/2015 07:20    Current Medications:  . amLODipine  5 mg Oral Daily   And  . benazepril  20 mg Oral Daily  . aspirin EC  81 mg Oral Daily  . carvedilol  6.25 mg Oral BID WC  . ezetimibe  10 mg Oral Daily  . fenofibrate  160 mg Oral Daily  . insulin aspart   0-15 Units Subcutaneous TID WC  . sertraline  100 mg Oral Daily  . sodium chloride  3 mL Intravenous Q12H  . sodium chloride  3 mL Intravenous Q12H  . tiotropium  18 mcg Inhalation Daily   . heparin 1,950 Units/hr (05/01/15 0416)    ASSESSMENT AND PLAN:  1. Unstable angina - He saw his cardiothoracic surgeon about 3 weeks ago who did what sounds like a CT scan to monitor aorta. Per patient, the doctor told him he had extensive coronary calcification in his coronary arteries that would require bypass surgery. Cyclic troponin values negative and EKG without acute changes - Cardiac cath on 04/30/2015 showed complex 3 vessel CAD. Anatomy poorly suited for PCI. Cardiothoracic surgery consulted. Planning for CABG on 05/04/2015. - Continue heparin gtt,ASA, and coreg. Plavix on hold in preparation for surgery. Continue Zetia (Intolerant to statins)  2. CAD - Diffuse 3 vessel disease with calcification - continue ASA, Plavix, BB, and Zetia.  3. HTN - BP well controlled.  - Continue amlodopine/benazepril   4. HLD - continue Zetia and fenofibrate  5. DM - SSI while inpatient.  6. Thoracic [facility] - [facility] St[facility] patient. - CT report (02/12/2015) from the [place] is available in the patient's shadow chart. Shows dilation of the aortic root measuring 4.6 cm in diameter at the sinuses of Valsalva.  7. Carotid artery stenosis - Carotid Dopplers in October 2015 demonstrated 1-39% bilateral carotid artery stenosis.  - Continue aspirin. Plavix on hold. Intolerant to statins.  Arna Medici , PA-C 9:02 AM 05/01/2015 Pager: [phone] Patient seen and examined and history reviewed. Agree with above findings and plan. Feels well. No chest pain. Had PFTs this am. Plan Echo and carotid dopplers later today. Expect CT to be reviewed by Dr. [name] from [place]. Plan surgery on Monday. Continue IV Heparin.  Enola Siebers [place], [place] 05/01/2015 10:15 AM

## 2015-05-02 ENCOUNTER — Inpatient Hospital Stay (HOSPITAL_COMMUNITY): Payer: Medicare Other

## 2015-05-02 DIAGNOSIS — I2510 Atherosclerotic heart disease of native coronary artery without angina pectoris: Secondary | ICD-10-CM

## 2015-05-02 LAB — CBC
HCT: 43.4 % (ref 39.0–52.0)
Hemoglobin: 14.6 g/dL (ref 13.0–17.0)
MCH: 31.5 pg (ref 26.0–34.0)
MCHC: 33.6 g/dL (ref 30.0–36.0)
MCV: 93.5 fL (ref 78.0–100.0)
PLATELETS: 183 10*3/uL (ref 150–400)
RBC: 4.64 MIL/uL (ref 4.22–5.81)
RDW: 12.9 % (ref 11.5–15.5)
WBC: 4.9 10*3/uL (ref 4.0–10.5)

## 2015-05-02 LAB — GLUCOSE, CAPILLARY
GLUCOSE-CAPILLARY: 187 mg/dL — AB (ref 65–99)
GLUCOSE-CAPILLARY: 150 mg/dL — AB (ref 65–99)
Glucose-Capillary: 140 mg/dL — ABNORMAL HIGH (ref 65–99)
Glucose-Capillary: 147 mg/dL — ABNORMAL HIGH (ref 65–99)

## 2015-05-02 LAB — HEPARIN LEVEL (UNFRACTIONATED)
Heparin Unfractionated: 0.23 IU/mL — ABNORMAL LOW (ref 0.30–0.70)
Heparin Unfractionated: 0.52 IU/mL (ref 0.30–0.70)

## 2015-05-02 NOTE — Progress Notes (Addendum)
Pre-op Cardiac Surgery  Carotid Findings:  1-39% ICA plaquing.  Vertebral artery flow is antegrade.   Upper Extremity Right Left  Brachial Pressures IV dressing 140T  Radial Waveforms T T  Ulnar Waveforms T T  Palmar Arch (Allen's Test) WNL Doppler signal obliterates with radial compression and remains normal with ulnar compression.    Findings:      Lower  Extremity Right Left  Dorsalis Pedis    Anterior Tibial 139T 156T  Posterior Tibial 143T 138T  Ankle/Brachial Indices >1 >1    Findings:  WNL    Emerita Berkemeier, RVT 05/02/2015, 8:55 AM

## 2015-05-02 NOTE — Progress Notes (Signed)
       Patient Name: Eduardo [name] Date of Encounter: 05/02/2015    SUBJECTIVE: Doing well. He denies angina.  TELEMETRY:  Normal sinus rhythm Filed Vitals:   05/02/15 0619 05/02/15 1007 05/02/15 1040 05/02/15 1200  BP: 131/72 212/73 130/70 110/71  Pulse: 57 73  63  Temp: 97.3 F (36.3 C) 98 F (36.7 C)  98 F (36.7 C)  TempSrc: Oral Oral  Oral  Resp: 18 18  18   Height:      Weight: 187 lb 6.4 oz (85.004 kg)     SpO2: 96% 94%  92%    Intake/Output Summary (Last 24 hours) at 05/02/15 1350 Last data filed at 05/02/15 1000  Gross per 24 hour  Intake 1694.7 ml  Output   1000 ml  Net  694.7 ml   LABS: Basic Metabolic Panel: No results for input(s): NA, K, CL, CO2, GLUCOSE, BUN, CREATININE, CALCIUM, MG, PHOS in the last 72 hours. CBC:  Recent Labs  05/01/15 0135 05/02/15 0257  WBC 5.2 4.9  HGB 14.1 14.6  HCT 42.2 43.4  MCV 93.8 93.5  PLT 186 183   ECHOCARDIOGRAM: 05/01/2015 Study Conclusions  - Left ventricle: The cavity size was normal. There was mild concentric hypertrophy. Systolic function was normal. The estimated ejection fraction was in the range of 55% to 60%. Probable mild hypokinesis of the basal-midinferior and inferoseptal myocardium. Left ventricular diastolic function parameters were normal. - Aortic valve: There was mild regurgitation  VASCULAR    Radiology/Studies:  No acute change  Physical Exam: Blood pressure 110/71, pulse 63, temperature 98 F (36.7 C), temperature source Oral, resp. rate 18, height 5\' 8"  (1.727 m), weight 187 lb 6.4 oz (85.004 kg), SpO2 92 %. Weight change: 1 lb 1.6 oz (0.499 kg)  Wt Readings from Last 3 Encounters:  05/02/15 187 lb 6.4 oz (85.004 kg)  01/27/15 192 lb 9.6 oz (87.363 kg)  11/04/14 195 lb (88.451 kg)   Much family is in the room with the patient and at his bedside. He is sitting and in no distress. Decreased breath sounds bilaterally but no rales or wheezing is heard Cardiac exam  is unremarkable Extremities reveal no edema  ASSESSMENT:  1. Severe three-vessel coronary disease, currently asymptomatic. Plavix is on hold. 2. Unstable angina controlled on IV anticoagulation and nitroglycerin. 3. Blood pressures well controlled  Plan:  1. Continue current level of care 2. Continue off Plavix 3. Plan surgery per Dr. [name] on Monday  Signed, Sinclair Grooms 05/02/2015, 1:50 PM

## 2015-05-02 NOTE — Progress Notes (Signed)
ANTICOAGULATION CONSULT NOTE - Follow Up Consult  Pharmacy Consult for Heparin Indication: 3V CAD, awaiting CABG  Allergies  Allergen Reactions  . Statins Other (See Comments)    Body aches    Patient Measurements: Height: 5\' 8"  (172.7 cm) Weight: 187 lb 6.4 oz (85.004 kg) (Scale A) IBW/kg (Calculated) : 68.4 Heparin Dosing Weight: 85 kg  Vital Signs: Temp: 98 F (36.7 C) (12/03 1200) Temp Source: Oral (12/03 1200) BP: 110/71 mmHg (12/03 1200) Pulse Rate: 63 (12/03 1200)  Labs:  Recent Labs  04/30/15 0827  05/01/15 0135 05/01/15 0931 05/02/15 0257 05/02/15 1334  HGB 14.6  --  14.1  --  14.6  --   HCT 43.1  --  42.2  --  43.4  --   PLT 188  --  186  --  183  --   HEPARINUNFRC 0.22*  < > 0.34 0.28* 0.23* 0.52  < > = values in this interval not displayed.  Estimated Creatinine Clearance: 91.1 mL/min (by C-G formula based on Cr of 0.75).  Assessment:   3V CAD, for CABG on 05/04/15.  Off Plavix, last dose 04/29/15.   Heparin level is now therapeutic (0.52) on 2300 units/hr. CBC stable.  Goal of Therapy:  Heparin level 0.3-0.7 units/ml Monitor platelets by anticoagulation protocol: Yes   Plan:   Continue heparin drip at 2300 units/hr.  Daily heparin level and CBC while on heparin.  Arty Baumgartner, [place] Pager: [phone] 05/02/2015,4:21 PM

## 2015-05-02 NOTE — Progress Notes (Signed)
ANTICOAGULATION CONSULT NOTE Pharmacy Consult for heparin  Indication: 3V CAD-> awaiting CABG  Patient Measurements: Height: 5\' 8"  (172.7 cm) Weight: 186 lb 4.8 oz (84.505 kg) IBW/kg (Calculated) : 68.4  Vital Signs: Temp: 98.2 F (36.8 C) (12/02 2144) Temp Source: Oral (12/02 2144) BP: 132/75 mmHg (12/02 2144) Pulse Rate: 59 (12/02 2144)  Labs:  Recent Labs  04/29/15 0714 04/30/15 0827  05/01/15 0135 05/01/15 0931 05/02/15 0257  HGB 14.3 14.6  --  14.1  --   --   HCT 42.5 43.1  --  42.2  --   --   PLT 197 188  --  186  --   --   LABPROT 14.5  --   --   --   --   --   INR 1.11  --   --   --   --   --   HEPARINUNFRC  --  0.22*  < > 0.34 0.28* 0.23*  CREATININE 0.75  --   --   --   --   --   TROPONINI <0.03  --   --   --   --   --   < > = values in this interval not displayed.   Assessment: 69yoM on heparin for 3V CAD - awaiting CABG on Monday. Heparin level 0.23 (subtherapeutic) on 2050 units/hr. No issues with line or bleeding reported per RN.  Goal of Therapy:  Heparin level 0.3-0.7 units/ml Monitor platelets by anticoagulation protocol: Yes   Plan:  Increase heparin to 2300 units / hr Follow up heparin level in 8 hours  Sherlon Handing, PharmD, BCPS Clinical pharmacist, pager [phone] 05/02/2015, 4:20 AM

## 2015-05-03 ENCOUNTER — Inpatient Hospital Stay (HOSPITAL_COMMUNITY): Payer: Medicare Other

## 2015-05-03 LAB — BASIC METABOLIC PANEL
Anion gap: 7 (ref 5–15)
BUN: 16 mg/dL (ref 6–20)
CHLORIDE: 103 mmol/L (ref 101–111)
CO2: 29 mmol/L (ref 22–32)
Calcium: 9.3 mg/dL (ref 8.9–10.3)
Creatinine, Ser: 0.89 mg/dL (ref 0.61–1.24)
Glucose, Bld: 144 mg/dL — ABNORMAL HIGH (ref 65–99)
POTASSIUM: 4.2 mmol/L (ref 3.5–5.1)
SODIUM: 139 mmol/L (ref 135–145)

## 2015-05-03 LAB — HEPARIN LEVEL (UNFRACTIONATED)
HEPARIN UNFRACTIONATED: 0.84 [IU]/mL — AB (ref 0.30–0.70)
HEPARIN UNFRACTIONATED: 0.55 [IU]/mL (ref 0.30–0.70)

## 2015-05-03 LAB — GLUCOSE, CAPILLARY
GLUCOSE-CAPILLARY: 179 mg/dL — AB (ref 65–99)
GLUCOSE-CAPILLARY: 149 mg/dL — AB (ref 65–99)
Glucose-Capillary: 155 mg/dL — ABNORMAL HIGH (ref 65–99)
Glucose-Capillary: 143 mg/dL — ABNORMAL HIGH (ref 65–99)

## 2015-05-03 LAB — CBC
HEMATOCRIT: 44.2 % (ref 39.0–52.0)
HEMOGLOBIN: 14.6 g/dL (ref 13.0–17.0)
MCH: 31.1 pg (ref 26.0–34.0)
MCHC: 33 g/dL (ref 30.0–36.0)
MCV: 94.2 fL (ref 78.0–100.0)
Platelets: 192 10*3/uL (ref 150–400)
RBC: 4.69 MIL/uL (ref 4.22–5.81)
RDW: 13.1 % (ref 11.5–15.5)
WBC: 6.2 10*3/uL (ref 4.0–10.5)

## 2015-05-03 LAB — TYPE AND SCREEN
ABO/RH(D): O POS
Antibody Screen: NEGATIVE

## 2015-05-03 LAB — ABO/RH: ABO/RH(D): O POS

## 2015-05-03 MED ORDER — TEMAZEPAM 15 MG PO CAPS
15.0000 mg | ORAL_CAPSULE | Freq: Once | ORAL | Status: DC | PRN
Start: 2015-05-03 — End: 2015-05-04

## 2015-05-03 MED ORDER — AMINOCAPROIC ACID 250 MG/ML IV SOLN
INTRAVENOUS | Status: AC
  Administered 2015-05-04: 69.8 mL/h via INTRAVENOUS
  Filled 2015-05-03: qty 40

## 2015-05-03 MED ORDER — PLASMA-LYTE 148 IV SOLN
INTRAVENOUS | Status: AC
  Administered 2015-05-04: 500 mL
  Filled 2015-05-03: qty 2.5

## 2015-05-03 MED ORDER — BISACODYL 5 MG PO TBEC
5.0000 mg | DELAYED_RELEASE_TABLET | Freq: Once | ORAL | Status: AC
  Administered 2015-05-03: 5 mg via ORAL
  Filled 2015-05-03: qty 1

## 2015-05-03 MED ORDER — DOPAMINE-DEXTROSE 3.2-5 MG/ML-% IV SOLN
0.0000 ug/kg/min | INTRAVENOUS | Status: DC
Start: 2015-05-04 — End: 2015-05-04
  Filled 2015-05-03 (×2): qty 250

## 2015-05-03 MED ORDER — MAGNESIUM SULFATE 50 % IJ SOLN
40.0000 meq | INTRAMUSCULAR | Status: DC
  Filled 2015-05-03: qty 10

## 2015-05-03 MED ORDER — DEXTROSE 5 % IV SOLN
750.0000 mg | INTRAVENOUS | Status: DC
  Filled 2015-05-03: qty 750

## 2015-05-03 MED ORDER — EPINEPHRINE HCL 1 MG/ML IJ SOLN
0.0000 ug/min | INTRAMUSCULAR | Status: DC
  Filled 2015-05-03: qty 4

## 2015-05-03 MED ORDER — DEXTROSE 5 % IV SOLN
1.5000 g | INTRAVENOUS | Status: AC
  Administered 2015-05-04: 1.5 g via INTRAVENOUS
  Administered 2015-05-04: .75 g via INTRAVENOUS
  Filled 2015-05-03 (×2): qty 1.5

## 2015-05-03 MED ORDER — VANCOMYCIN HCL 10 G IV SOLR
1500.0000 mg | INTRAVENOUS | Status: AC
  Administered 2015-05-04: 1500 mg via INTRAVENOUS
  Filled 2015-05-03: qty 1500

## 2015-05-03 MED ORDER — METOPROLOL TARTRATE 12.5 MG HALF TABLET
12.5000 mg | ORAL_TABLET | Freq: Once | ORAL | Status: AC
Start: 2015-05-04 — End: 2015-05-04
  Administered 2015-05-04: 12.5 mg via ORAL
  Filled 2015-05-03: qty 1

## 2015-05-03 MED ORDER — POTASSIUM CHLORIDE 2 MEQ/ML IV SOLN
80.0000 meq | INTRAVENOUS | Status: DC
  Filled 2015-05-03: qty 40

## 2015-05-03 MED ORDER — SODIUM CHLORIDE 0.9 % IV SOLN
INTRAVENOUS | Status: DC
  Filled 2015-05-03: qty 30

## 2015-05-03 MED ORDER — CHLORHEXIDINE GLUCONATE CLOTH 2 % EX PADS
6.0000 | MEDICATED_PAD | Freq: Once | CUTANEOUS | Status: AC
Start: 2015-05-03 — End: 2015-05-03
  Administered 2015-05-03: 6 via TOPICAL

## 2015-05-03 MED ORDER — PHENYLEPHRINE HCL 10 MG/ML IJ SOLN
30.0000 ug/min | INTRAVENOUS | Status: AC
  Administered 2015-05-04: 25 ug/min via INTRAVENOUS
  Administered 2015-05-04: 15 ug/min via INTRAVENOUS
  Filled 2015-05-03: qty 2

## 2015-05-03 MED ORDER — NITROGLYCERIN IN D5W 200-5 MCG/ML-% IV SOLN
2.0000 ug/min | INTRAVENOUS | Status: AC
  Administered 2015-05-04: 5 ug/min via INTRAVENOUS
  Filled 2015-05-03: qty 250

## 2015-05-03 MED ORDER — DEXMEDETOMIDINE HCL IN NACL 400 MCG/100ML IV SOLN
0.1000 ug/kg/h | INTRAVENOUS | Status: AC
  Administered 2015-05-04: .2 ug/kg/h via INTRAVENOUS
  Filled 2015-05-03: qty 100

## 2015-05-03 MED ORDER — CHLORHEXIDINE GLUCONATE CLOTH 2 % EX PADS
6.0000 | MEDICATED_PAD | Freq: Once | CUTANEOUS | Status: AC
  Administered 2015-05-04: 6 via TOPICAL

## 2015-05-03 MED ORDER — CHLORHEXIDINE GLUCONATE 0.12 % MT SOLN
15.0000 mL | Freq: Once | OROMUCOSAL | Status: AC
  Administered 2015-05-04: 15 mL via OROMUCOSAL
  Filled 2015-05-03: qty 15

## 2015-05-03 MED ORDER — SODIUM CHLORIDE 0.9 % IV SOLN
INTRAVENOUS | Status: AC
  Administered 2015-05-04: 1.9 [IU]/h via INTRAVENOUS
  Filled 2015-05-03: qty 2.5

## 2015-05-03 NOTE — Progress Notes (Signed)
ANTICOAGULATION CONSULT NOTE - Follow Up Consult  Pharmacy Consult for Heparin Indication: 3V CAD, awaiting CABG  Allergies  Allergen Reactions  . Statins Other (See Comments)    Body aches    Patient Measurements: Height: 5\' 8"  (172.7 cm) Weight: 191 lb 3.2 oz (86.728 kg) (scale a) IBW/kg (Calculated) : 68.4 Heparin Dosing Weight: 85 kg  Vital Signs: Temp: 97.6 F (36.4 C) (12/04 0947) Temp Source: Oral (12/04 0947) BP: 126/76 mmHg (12/04 0947) Pulse Rate: 62 (12/04 0947)  Labs:  Recent Labs  05/01/15 0135  05/02/15 0257 05/02/15 1334 05/03/15 0412 05/03/15 1355  HGB 14.1  --  14.6  --  14.6  --   HCT 42.2  --  43.4  --  44.2  --   PLT 186  --  183  --  192  --   HEPARINUNFRC 0.34  < > 0.23* 0.52 0.84* 0.55  CREATININE  --   --   --   --  0.89  --   < > = values in this interval not displayed.  Estimated Creatinine Clearance: 82.7 mL/min (by C-G formula based on Cr of 0.89).  Assessment:   3V CAD, for CABG on 05/04/15.  Off Plavix, last dose 04/29/15.   Heparin level is now therapeutic (0.55) on 2150 units/hr. CBC stable.  Goal of Therapy:  Heparin level 0.3-0.7 units/ml Monitor platelets by anticoagulation protocol: Yes   Plan:   Continue heparin drip at 2250 units/hr.  Daily heparin level and CBC while on heparin.  [facility] in am.  Arty Baumgartner, [place] Pager: [phone] 05/03/2015,3:09 PM

## 2015-05-03 NOTE — Progress Notes (Signed)
Patient Name: Eduardo [name] Date of Encounter: 05/03/2015  Active Problems:   CAD (coronary artery disease)   HTN (hypertension)   Hyperlipidemia   Type 2 diabetes mellitus without complication, without long-term current use of insulin (HCC)   COPD (chronic obstructive pulmonary disease) (HCC)   PTSD (post-traumatic stress disorder)   Unstable angina [facility])    Primary Cardiologist: Dr. Bronson Ing Patient Profile: 69 yo male w/ PMH of CAD (s/p multiple PTCAs in the past but no stents), thoracic aortic aneurysm, COPD, DM, and OSA who was transferred to [facility] from [facility] on 04/28/2015 for chest pain consistent with  unstable angina.   SUBJECTIVE: No chest pain. Feeling well. No complaints. Awaiting CABG tomorrow with Dr. [name].   OBJECTIVE Filed Vitals:   05/02/15 1040 05/02/15 1200 05/02/15 1952 05/03/15 0520  BP: 130/70 110/71 112/65 126/78  Pulse:  63 64 57  Temp:  98 F (36.7 C) 98 F (36.7 C) 97.5 F (36.4 C)  TempSrc:  Oral Oral Oral  Resp:  18 18 18   Height:      Weight:    191 lb 3.2 oz (86.728 kg)  SpO2:  92% 91% 93%    Intake/Output Summary (Last 24 hours) at 05/03/15 0846 Last data filed at 05/03/15 [id number]  Gross per 24 hour  Intake 1711.71 ml  Output   1800 ml  Net -88.29 ml   Filed Weights   05/01/15 0446 05/02/15 0619 05/03/15 0520  Weight: 186 lb 4.8 oz (84.505 kg) 187 lb 6.4 oz (85.004 kg) 191 lb 3.2 oz (86.728 kg)    PHYSICAL EXAM General: Well developed, well nourished, male in no acute distress. Head: Normocephalic, atraumatic.  Neck: Supple without bruits, JVD not elevated. Lungs:  Resp regular and unlabored, CTA without wheezing or rales. Heart: RRR, S1, S2, no S3, S4, or murmur; no rub. Abdomen: Soft, non-tender, non-distended with normoactive bowel sounds. No hepatomegaly. No rebound/guarding. No obvious abdominal masses. Extremities: No clubbing, cyanosis, or edema. Distal pedal pulses are 2+ bilaterally. Neuro: Alert  and oriented X 3. Moves all extremities spontaneously. Psych: Normal affect.  LABS: CBC:  Recent Labs  05/02/15 0257 05/03/15 0412  WBC 4.9 6.2  HGB 14.6 14.6  HCT 43.4 44.2  MCV 93.5 94.2  PLT 183 192   INR:  Basic Metabolic Panel:  Recent Labs  05/03/15 0412  NA 139  K 4.2  CL 103  CO2 29  GLUCOSE 144*  BUN 16  CREATININE 0.89  CALCIUM 9.3     TELE: 1st degree AV block with rate in 50's - 60's. No atopic events.       ECG: Sinus brady, 1st degree AV block, no acute ST or T-wave changes.   Procedures    Left Heart Cath and Coronary Angiography    Conclusion     Prox RCA lesion, 50% stenosed, heavily calcified and eccentric.  Mid RCA lesion, 70% stenosed, heavily calcified and eccentric.  Mid Cx lesion at ostium of OM2, 75% stenosed- seen best in the cranial view and noted on 2015 cath.  Mid LAD lesion, 80% stenosed- progressed since the 2015 cath.  Ost 2nd Diag to 2nd Diag lesion, 60% stenosed.  The left ventricular systolic function is normal.  Discussed with the inpatient team (Dr. Martinique). Patient with thoracic aneurysm of unknown size. Given how difficult RCA PCI would be due to calcification and tortuosity, will get cardiac surgery evaluation.   Stop clopidogrel at this time. Resume heparin 8 hours post  sheath pull.     Indications    Coronary artery disease involving native coronary artery of native heart with unstable angina pectoris ([place]) [I25.110 (ICD-10-CM)]    Technique and Indications    The risks, benefits, and details of the procedure were explained to the patient. The patient verbalized understanding and wanted to proceed. Informed written consent was obtained.  PROCEDURE TECHNIQUE: After Xylocaine anesthesia a 47F slender sheath was placed in the right radial artery with a single anterior needle wall stick. IV Heparin was given. Right coronary angiography was done using a Judkins R4 guide catheter. Left coronary  angiography was done using a Judkins L3.5 guide catheter. Left ventriculography was done using a pigtail catheter. A TR band was used for hemostasis.  Contrast: 80 cc  Estimated blood loss <50 mL. There were no immediate complications during the procedure.    Coronary Findings    Dominance: Right   Left Anterior Descending   . Mid LAD lesion, 80% stenosed.   . Second Diagonal Branch   . Ost 2nd Diag to 2nd Diag lesion, 60% stenosed.     Left Circumflex   . Mid Cx lesion, 75% stenosed.     Right Coronary Artery  . Vessel is large. There is mild the vessel.   . Prox RCA lesion, 50% stenosed. Calcified.   . Mid RCA lesion, 70% stenosed. Calcified.      Wall Motion                 Left Heart    Left Ventricle The left ventricular size is normal. The left ventricular systolic function is normal. The left ventricular ejection fraction is 55-65% by visual estimate. There are no wall motion abnormalities in the left ventricle.   Aortic Valve There is no aortic valve stenosis.    Coronary Diagrams    Diagnostic Diagram              Current Medications:  . amLODipine  5 mg Oral Daily   And  . benazepril  20 mg Oral Daily  . aspirin EC  81 mg Oral Daily  . bisacodyl  5 mg Oral Once  . carvedilol  6.25 mg Oral BID WC  . [START ON 05/04/2015] chlorhexidine  15 mL Mouth/Throat Once  . Chlorhexidine Gluconate Cloth  6 each Topical Once   And  . Chlorhexidine Gluconate Cloth  6 each Topical Once  . ezetimibe  10 mg Oral Daily  . fenofibrate  160 mg Oral Daily  . insulin aspart  0-15 Units Subcutaneous TID WC  . [START ON 05/04/2015] metoprolol tartrate  12.5 mg Oral Once  . sertraline  100 mg Oral Daily  . sodium chloride  3 mL Intravenous Q12H  . sodium chloride  3 mL Intravenous Q12H  . tiotropium  18 mcg Inhalation Daily   . heparin 2,150 Units/hr (05/03/15 0545)    ASSESSMENT AND PLAN:  69 yo male w/ PMH of CAD (s/p multiple PTCAs in the past but  no stents), thoracic aortic aneurysm, COPD, DM, and OSA who was transferred to [facility] from [facility] on 04/28/2015 for chest pain concerning for unstable angina  1. Unstable angina - He saw his cardiothoracic surgeon about 3 weeks ago who did what sounds like a CT scan to monitor aorta. Per patient, the doctor told him he had extensive coronary calcification in his coronary arteries that would require bypass surgery. Cyclic troponin values negative and EKG without acute changes - Cardiac cath on 04/30/2015  showed complex 3 vessel CAD. Anatomy poorly suited for PCI. - Cardiothoracic surgery consulted. Planning for CABG on 05/04/2015. - Continue heparin gtt,ASA, and coreg. Plavix on hold in preparation for surgery. Continue Zetia (Intolerant to statins)  2. CAD - Diffuse 3 vessel disease with calcification - continue ASA, Plavix, BB, and Zetia.  3. HTN - BP well controlled.  - Continue amlodopine/benazepril   4. HLD - LDL excellent at 36. Continue Zetia and fenofibrate  5. DM - SSI while inpatient.  6. Thoracic [facility] - [facility] St[facility] patient. - CT report (02/12/2015) from the [place] is available in the patient's shadow chart. Shows dilation of the aortic root measuring 4.6 cm in diameter at the sinuses of Valsalva.  7. Carotid artery stenosis - Carotid Dopplers in October 2015 demonstrated 1-39% bilateral carotid artery stenosis.  - Continue aspirin. Plavix on hold. Intolerant to statins.  Judy Pimple, PA-CFACC 05/03/2015 8:46 AM

## 2015-05-03 NOTE — Progress Notes (Signed)
ANTICOAGULATION CONSULT NOTE - Follow Up Consult  Pharmacy Consult for Heparin Indication: 3V CAD, awaiting CABG  Allergies  Allergen Reactions  . Statins Other (See Comments)    Body aches    Patient Measurements: Height: 5\' 8"  (172.7 cm) Weight: 191 lb 3.2 oz (86.728 kg) (scale a) IBW/kg (Calculated) : 68.4 Heparin Dosing Weight: 85 kg  Vital Signs: Temp: 97.5 F (36.4 C) (12/04 0520) Temp Source: Oral (12/04 0520) BP: 126/78 mmHg (12/04 0520) Pulse Rate: 57 (12/04 0520)  Labs:  Recent Labs  05/01/15 0135  05/02/15 0257 05/02/15 1334 05/03/15 0412  HGB 14.1  --  14.6  --  14.6  HCT 42.2  --  43.4  --  44.2  PLT 186  --  183  --  192  HEPARINUNFRC 0.34  < > 0.23* 0.52 0.84*  < > = values in this interval not displayed.  Estimated Creatinine Clearance: 92 mL/min (by C-G formula based on Cr of 0.75).  Assessment: 69 y.o. [name] on heparin for 3V CAD, for CABG on 05/04/15. Off Plavix, last dose 04/29/15. Heparin level supratherapeutic (0.84) on 2300 units/hr. No issues with line or bleeding reported per RN.  Goal of Therapy:  Heparin level 0.3-0.7 units/ml Monitor platelets by anticoagulation protocol: Yes   Plan:  Decrease heparin drip to 2150 units/hr. F/u 8 hr heparin level  Sherlon Handing, PharmD, BCPS Clinical pharmacist, pager [phone] 05/03/2015,5:32 AM

## 2015-05-03 NOTE — Progress Notes (Signed)
CD with recent films did not arrive after multiple calls th [facility], will repeat non contrast ct of chest to measure aortic size, avoiding contrast due to surgery tomorrow. [name] MD      [address] [place] [address] Office [phone]   [place]

## 2015-05-04 ENCOUNTER — Inpatient Hospital Stay (HOSPITAL_COMMUNITY): Payer: Medicare Other

## 2015-05-04 ENCOUNTER — Other Ambulatory Visit: Payer: Self-pay

## 2015-05-04 ENCOUNTER — Inpatient Hospital Stay (HOSPITAL_COMMUNITY): Payer: Medicare Other | Admitting: Certified Registered"

## 2015-05-04 ENCOUNTER — Encounter (HOSPITAL_COMMUNITY)
Admission: AD | Disposition: A | Discharge status: Home / Self Care | Payer: TRICARE For Life (TFL) | Source: Other Acute Inpatient Hospital | Attending: Cardiothoracic Surgery

## 2015-05-04 ENCOUNTER — Encounter (HOSPITAL_COMMUNITY): Payer: Self-pay | Admitting: Certified Registered"

## 2015-05-04 HISTORY — PX: TEE WITHOUT CARDIOVERSION: SHX5443

## 2015-05-04 HISTORY — PX: CORONARY ARTERY BYPASS GRAFT: SHX141

## 2015-05-04 LAB — POCT I-STAT, CHEM 8
BUN: 19 mg/dL (ref 6–20)
BUN: 17 mg/dL (ref 6–20)
BUN: 20 mg/dL (ref 6–20)
BUN: 18 mg/dL (ref 6–20)
BUN: 17 mg/dL (ref 6–20)
BUN: 16 mg/dL (ref 6–20)
CALCIUM ION: 1.21 mmol/L (ref 1.13–1.30)
CALCIUM ION: 1.08 mmol/L — AB (ref 1.13–1.30)
CHLORIDE: 100 mmol/L — AB (ref 101–111)
CHLORIDE: 99 mmol/L — AB (ref 101–111)
CHLORIDE: 97 mmol/L — AB (ref 101–111)
CREATININE: 0.7 mg/dL (ref 0.61–1.24)
CREATININE: 0.7 mg/dL (ref 0.61–1.24)
CREATININE: 0.9 mg/dL (ref 0.61–1.24)
Calcium, Ion: 1.28 mmol/L (ref 1.13–1.30)
Calcium, Ion: 1.04 mmol/L — ABNORMAL LOW (ref 1.13–1.30)
Calcium, Ion: 1.07 mmol/L — ABNORMAL LOW (ref 1.13–1.30)
Calcium, Ion: 1.1 mmol/L — ABNORMAL LOW (ref 1.13–1.30)
Chloride: 101 mmol/L (ref 101–111)
Chloride: 96 mmol/L — ABNORMAL LOW (ref 101–111)
Chloride: 102 mmol/L (ref 101–111)
Creatinine, Ser: 0.6 mg/dL — ABNORMAL LOW (ref 0.61–1.24)
Creatinine, Ser: 0.7 mg/dL (ref 0.61–1.24)
Creatinine, Ser: 0.7 mg/dL (ref 0.61–1.24)
GLUCOSE: 151 mg/dL — AB (ref 65–99)
GLUCOSE: 205 mg/dL — AB (ref 65–99)
Glucose, Bld: 155 mg/dL — ABNORMAL HIGH (ref 65–99)
Glucose, Bld: 124 mg/dL — ABNORMAL HIGH (ref 65–99)
Glucose, Bld: 146 mg/dL — ABNORMAL HIGH (ref 65–99)
Glucose, Bld: 146 mg/dL — ABNORMAL HIGH (ref 65–99)
HCT: 42 % (ref 39.0–52.0)
HCT: 34 % — ABNORMAL LOW (ref 39.0–52.0)
HCT: 31 % — ABNORMAL LOW (ref 39.0–52.0)
HEMATOCRIT: 42 % (ref 39.0–52.0)
HEMATOCRIT: 32 % — AB (ref 39.0–52.0)
HEMATOCRIT: 39 % (ref 39.0–52.0)
HEMOGLOBIN: 14.3 g/dL (ref 13.0–17.0)
HEMOGLOBIN: 14.3 g/dL (ref 13.0–17.0)
HEMOGLOBIN: 13.3 g/dL (ref 13.0–17.0)
Hemoglobin: 10.9 g/dL — ABNORMAL LOW (ref 13.0–17.0)
Hemoglobin: 11.6 g/dL — ABNORMAL LOW (ref 13.0–17.0)
Hemoglobin: 10.5 g/dL — ABNORMAL LOW (ref 13.0–17.0)
POTASSIUM: 4.3 mmol/L (ref 3.5–5.1)
POTASSIUM: 4.8 mmol/L (ref 3.5–5.1)
POTASSIUM: 5.5 mmol/L — AB (ref 3.5–5.1)
POTASSIUM: 4.1 mmol/L (ref 3.5–5.1)
Potassium: 4.6 mmol/L (ref 3.5–5.1)
Potassium: 4.1 mmol/L (ref 3.5–5.1)
SODIUM: 139 mmol/L (ref 135–145)
SODIUM: 135 mmol/L (ref 135–145)
SODIUM: 139 mmol/L (ref 135–145)
SODIUM: 134 mmol/L — AB (ref 135–145)
SODIUM: 137 mmol/L (ref 135–145)
Sodium: 139 mmol/L (ref 135–145)
TCO2: 27 mmol/L (ref 0–100)
TCO2: 28 mmol/L (ref 0–100)
TCO2: 30 mmol/L (ref 0–100)
TCO2: 23 mmol/L (ref 0–100)
TCO2: 27 mmol/L (ref 0–100)
TCO2: 25 mmol/L (ref 0–100)

## 2015-05-04 LAB — CBC
HCT: 38.3 % — ABNORMAL LOW (ref 39.0–52.0)
HCT: 43.9 % (ref 39.0–52.0)
HEMATOCRIT: 42.3 % (ref 39.0–52.0)
HEMOGLOBIN: 14.6 g/dL (ref 13.0–17.0)
HEMOGLOBIN: 14.9 g/dL (ref 13.0–17.0)
Hemoglobin: 12.9 g/dL — ABNORMAL LOW (ref 13.0–17.0)
MCH: 31.9 pg (ref 26.0–34.0)
MCH: 31.8 pg (ref 26.0–34.0)
MCH: 31.3 pg (ref 26.0–34.0)
MCHC: 34.5 g/dL (ref 30.0–36.0)
MCHC: 33.9 g/dL (ref 30.0–36.0)
MCHC: 33.7 g/dL (ref 30.0–36.0)
MCV: 92.6 fL (ref 78.0–100.0)
MCV: 93 fL (ref 78.0–100.0)
MCV: 93.6 fL (ref 78.0–100.0)
Platelets: 157 10*3/uL (ref 150–400)
Platelets: 161 10*3/uL (ref 150–400)
Platelets: 185 10*3/uL (ref 150–400)
RBC: 4.12 MIL/uL — ABNORMAL LOW (ref 4.22–5.81)
RBC: 4.57 MIL/uL (ref 4.22–5.81)
RBC: 4.69 MIL/uL (ref 4.22–5.81)
RDW: 13 % (ref 11.5–15.5)
RDW: 13 % (ref 11.5–15.5)
RDW: 13.1 % (ref 11.5–15.5)
WBC: 12.7 10*3/uL — ABNORMAL HIGH (ref 4.0–10.5)
WBC: 17.4 10*3/uL — ABNORMAL HIGH (ref 4.0–10.5)
WBC: 5.8 10*3/uL (ref 4.0–10.5)

## 2015-05-04 LAB — POCT I-STAT 4, (NA,K, GLUC, HGB,HCT)
Glucose, Bld: 146 mg/dL — ABNORMAL HIGH (ref 65–99)
HCT: 42 % (ref 39.0–52.0)
Hemoglobin: 14.3 g/dL (ref 13.0–17.0)
POTASSIUM: 4.2 mmol/L (ref 3.5–5.1)
SODIUM: 139 mmol/L (ref 135–145)

## 2015-05-04 LAB — POCT I-STAT 3, ART BLOOD GAS (G3+)
ACID-BASE DEFICIT: 3 mmol/L — AB (ref 0.0–2.0)
Acid-Base Excess: 1 mmol/L (ref 0.0–2.0)
Acid-Base Excess: 1 mmol/L (ref 0.0–2.0)
BICARBONATE: 27.4 meq/L — AB (ref 20.0–24.0)
BICARBONATE: 26.4 meq/L — AB (ref 20.0–24.0)
Bicarbonate: 27.3 mEq/L — ABNORMAL HIGH (ref 20.0–24.0)
Bicarbonate: 24.3 mEq/L — ABNORMAL HIGH (ref 20.0–24.0)
O2 SAT: 100 %
O2 SAT: 95 %
O2 Saturation: 95 %
O2 Saturation: 97 %
PCO2 ART: 50.5 mmHg — AB (ref 35.0–45.0)
PH ART: 7.341 — AB (ref 7.350–7.450)
PH ART: 7.303 — AB (ref 7.350–7.450)
PH ART: 7.371 (ref 7.350–7.450)
PO2 ART: 271 mmHg — AB (ref 80.0–100.0)
TCO2: 29 mmol/L (ref 0–100)
TCO2: 29 mmol/L (ref 0–100)
TCO2: 26 mmol/L (ref 0–100)
TCO2: 28 mmol/L (ref 0–100)
pCO2 arterial: 55.1 mmHg — ABNORMAL HIGH (ref 35.0–45.0)
pCO2 arterial: 49 mmHg — ABNORMAL HIGH (ref 35.0–45.0)
pCO2 arterial: 45.5 mmHg — ABNORMAL HIGH (ref 35.0–45.0)
pH, Arterial: 7.305 — ABNORMAL LOW (ref 7.350–7.450)
pO2, Arterial: 82 mmHg (ref 80.0–100.0)
pO2, Arterial: 87 mmHg (ref 80.0–100.0)
pO2, Arterial: 98 mmHg (ref 80.0–100.0)

## 2015-05-04 LAB — BASIC METABOLIC PANEL
Anion gap: 6 (ref 5–15)
BUN: 20 mg/dL (ref 6–20)
CO2: 27 mmol/L (ref 22–32)
Calcium: 9.4 mg/dL (ref 8.9–10.3)
Chloride: 105 mmol/L (ref 101–111)
Creatinine, Ser: 0.91 mg/dL (ref 0.61–1.24)
GFR calc Af Amer: >60 mL/min (ref >60)
GFR calc non Af Amer: >60 mL/min (ref >60)
Glucose, Bld: 129 mg/dL — ABNORMAL HIGH (ref 65–99)
Potassium: 4.8 mmol/L (ref 3.5–5.1)
Sodium: 138 mmol/L (ref 135–145)

## 2015-05-04 LAB — GLUCOSE, CAPILLARY
GLUCOSE-CAPILLARY: 127 mg/dL — AB (ref 65–99)
GLUCOSE-CAPILLARY: 105 mg/dL — AB (ref 65–99)
Glucose-Capillary: 111 mg/dL — ABNORMAL HIGH (ref 65–99)
Glucose-Capillary: 118 mg/dL — ABNORMAL HIGH (ref 65–99)
Glucose-Capillary: 116 mg/dL — ABNORMAL HIGH (ref 65–99)

## 2015-05-04 LAB — CREATININE, SERUM
Creatinine, Ser: 0.83 mg/dL (ref 0.61–1.24)
GFR calc Af Amer: >60 mL/min (ref >60)
GFR calc non Af Amer: >60 mL/min (ref >60)

## 2015-05-04 LAB — PLATELET COUNT: Platelets: 166 10*3/uL (ref 150–400)

## 2015-05-04 LAB — HEMOGLOBIN AND HEMATOCRIT, BLOOD
HCT: 32.7 % — ABNORMAL LOW (ref 39.0–52.0)
Hemoglobin: 11.2 g/dL — ABNORMAL LOW (ref 13.0–17.0)

## 2015-05-04 LAB — APTT: aPTT: 35 seconds (ref 24–37)

## 2015-05-04 LAB — MAGNESIUM: Magnesium: 2.3 mg/dL (ref 1.7–2.4)

## 2015-05-04 LAB — HEPARIN LEVEL (UNFRACTIONATED): HEPARIN UNFRACTIONATED: 0.57 [IU]/mL (ref 0.30–0.70)

## 2015-05-04 LAB — PROTIME-INR
INR: 1.38 (ref 0.00–1.49)
PROTHROMBIN TIME: 17.1 s — AB (ref 11.6–15.2)

## 2015-05-04 SURGERY — CORONARY ARTERY BYPASS GRAFTING (CABG)
Anesthesia: General | Site: Chest

## 2015-05-04 MED ORDER — ACETAMINOPHEN 500 MG PO TABS
1000.0000 mg | ORAL_TABLET | Freq: Four times a day (QID) | ORAL | Status: DC
  Administered 2015-05-05 – 2015-05-08 (×9): 1000 mg via ORAL
  Filled 2015-05-04 (×8): qty 2

## 2015-05-04 MED ORDER — PROPOFOL 10 MG/ML IV BOLUS
INTRAVENOUS | Status: DC | PRN
Start: 2015-05-04 — End: 2015-05-04
  Administered 2015-05-04: 60 mg via INTRAVENOUS

## 2015-05-04 MED ORDER — SODIUM CHLORIDE 0.9 % IJ SOLN
OROMUCOSAL | Status: DC | PRN
  Administered 2015-05-04 (×4): 4 mL via TOPICAL

## 2015-05-04 MED ORDER — FENTANYL CITRATE (PF) 250 MCG/5ML IJ SOLN
INTRAMUSCULAR | Status: AC
  Filled 2015-05-04: qty 5

## 2015-05-04 MED ORDER — FENTANYL CITRATE (PF) 250 MCG/5ML IJ SOLN
INTRAMUSCULAR | Status: AC
  Filled 2015-05-04: qty 25

## 2015-05-04 MED ORDER — METOPROLOL TARTRATE 12.5 MG HALF TABLET
12.5000 mg | ORAL_TABLET | Freq: Two times a day (BID) | ORAL | Status: DC
  Administered 2015-05-05 – 2015-05-07 (×4): 12.5 mg via ORAL
  Filled 2015-05-04 (×4): qty 1

## 2015-05-04 MED ORDER — VECURONIUM BROMIDE 10 MG IV SOLR
INTRAVENOUS | Status: AC
  Filled 2015-05-04: qty 20

## 2015-05-04 MED ORDER — ONDANSETRON HCL 4 MG/2ML IJ SOLN
4.0000 mg | Freq: Four times a day (QID) | INTRAMUSCULAR | Status: DC | PRN
  Administered 2015-05-06: 4 mg via INTRAVENOUS
  Filled 2015-05-04: qty 2

## 2015-05-04 MED ORDER — ROCURONIUM BROMIDE 50 MG/5ML IV SOLN
INTRAVENOUS | Status: AC
Start: 2015-05-04 — End: 2015-05-04
  Filled 2015-05-04: qty 1

## 2015-05-04 MED ORDER — ANTISEPTIC ORAL RINSE SOLUTION (CORINZ)
7.0000 mL | OROMUCOSAL | Status: DC
  Administered 2015-05-04 (×2): 7 mL via OROMUCOSAL

## 2015-05-04 MED ORDER — SODIUM CHLORIDE 0.9 % IJ SOLN: 3.0000 mL | INTRAMUSCULAR | Status: DC | PRN

## 2015-05-04 MED ORDER — LEVALBUTEROL HCL 0.63 MG/3ML IN NEBU
0.6300 mg | INHALATION_SOLUTION | Freq: Four times a day (QID) | RESPIRATORY_TRACT | Status: DC
  Administered 2015-05-04 (×2): 0.63 mg via RESPIRATORY_TRACT
  Filled 2015-05-04 (×2): qty 3

## 2015-05-04 MED ORDER — SODIUM CHLORIDE 0.45 % IV SOLN
INTRAVENOUS | Status: DC | PRN
  Administered 2015-05-04: 20 mL/h via INTRAVENOUS

## 2015-05-04 MED ORDER — LACTATED RINGERS IV SOLN
INTRAVENOUS | Status: DC
  Administered 2015-05-04: 20 mL/h via INTRAVENOUS

## 2015-05-04 MED ORDER — SODIUM CHLORIDE 0.9 % IJ SOLN
INTRAMUSCULAR | Status: AC
  Filled 2015-05-04: qty 10

## 2015-05-04 MED ORDER — LEVALBUTEROL HCL 0.63 MG/3ML IN NEBU
0.6300 mg | INHALATION_SOLUTION | RESPIRATORY_TRACT | Status: DC
  Administered 2015-05-04 – 2015-05-05 (×3): 0.63 mg via RESPIRATORY_TRACT
  Filled 2015-05-04 (×4): qty 3

## 2015-05-04 MED ORDER — METOPROLOL TARTRATE 1 MG/ML IV SOLN
2.5000 mg | INTRAVENOUS | Status: DC | PRN
  Administered 2015-05-07: 5 mg via INTRAVENOUS

## 2015-05-04 MED ORDER — LACTATED RINGERS IV SOLN
INTRAVENOUS | Status: DC | PRN
  Administered 2015-05-04: 07:00:00 via INTRAVENOUS

## 2015-05-04 MED ORDER — ALBUTEROL SULFATE HFA 108 (90 BASE) MCG/ACT IN AERS
INHALATION_SPRAY | RESPIRATORY_TRACT | Status: DC | PRN
  Administered 2015-05-04 (×2): 2 via RESPIRATORY_TRACT

## 2015-05-04 MED ORDER — FENTANYL CITRATE (PF) 100 MCG/2ML IJ SOLN
INTRAMUSCULAR | Status: DC | PRN
  Administered 2015-05-04: 350 ug via INTRAVENOUS
  Administered 2015-05-04: 500 ug via INTRAVENOUS
  Administered 2015-05-04: 100 ug via INTRAVENOUS
  Administered 2015-05-04: 50 ug via INTRAVENOUS
  Administered 2015-05-04 (×3): 250 ug via INTRAVENOUS

## 2015-05-04 MED ORDER — MIDAZOLAM HCL 2 MG/2ML IJ SOLN
2.0000 mg | INTRAMUSCULAR | Status: DC | PRN
  Administered 2015-05-04 (×2): 2 mg via INTRAVENOUS
  Filled 2015-05-04 (×2): qty 2

## 2015-05-04 MED ORDER — BISACODYL 10 MG RE SUPP
10.0000 mg | Freq: Every day | RECTAL | Status: DC
  Administered 2015-05-06: 10 mg via RECTAL
  Filled 2015-05-04: qty 1

## 2015-05-04 MED ORDER — HEPARIN SODIUM (PORCINE) 1000 UNIT/ML IJ SOLN
INTRAMUSCULAR | Status: DC | PRN
Start: 2015-05-04 — End: 2015-05-04
  Administered 2015-05-04: 28000 [IU] via INTRAVENOUS

## 2015-05-04 MED ORDER — MIDAZOLAM HCL 5 MG/5ML IJ SOLN
INTRAMUSCULAR | Status: DC | PRN
  Administered 2015-05-04: 3 mg via INTRAVENOUS
  Administered 2015-05-04: 2 mg via INTRAVENOUS
  Administered 2015-05-04 (×2): 1 mg via INTRAVENOUS
  Administered 2015-05-04: 3 mg via INTRAVENOUS

## 2015-05-04 MED ORDER — INSULIN REGULAR BOLUS VIA INFUSION
0.0000 [IU] | Freq: Three times a day (TID) | INTRAVENOUS | Status: DC
Start: 2015-05-04 — End: 2015-05-06
  Filled 2015-05-04: qty 10

## 2015-05-04 MED ORDER — ACETAMINOPHEN 160 MG/5ML PO SOLN: 650.0000 mg | Freq: Once | ORAL | Status: AC

## 2015-05-04 MED ORDER — SODIUM CHLORIDE 0.9 % IV SOLN: 250.0000 mL | INTRAVENOUS | Status: DC

## 2015-05-04 MED ORDER — VANCOMYCIN HCL IN DEXTROSE 1-5 GM/200ML-% IV SOLN
1000.0000 mg | Freq: Once | INTRAVENOUS | Status: AC
  Administered 2015-05-04: 1000 mg via INTRAVENOUS
  Filled 2015-05-04: qty 200

## 2015-05-04 MED ORDER — 0.9 % SODIUM CHLORIDE (POUR BTL) OPTIME
TOPICAL | Status: DC | PRN
  Administered 2015-05-04: 6000 mL

## 2015-05-04 MED ORDER — ACETAMINOPHEN 650 MG RE SUPP
650.0000 mg | Freq: Once | RECTAL | Status: AC
  Administered 2015-05-04: 650 mg via RECTAL

## 2015-05-04 MED ORDER — NITROGLYCERIN IN D5W 200-5 MCG/ML-% IV SOLN: 0.0000 ug/min | INTRAVENOUS | Status: DC

## 2015-05-04 MED ORDER — PHENYLEPHRINE HCL 10 MG/ML IJ SOLN
0.0000 ug/min | INTRAVENOUS | Status: DC
  Administered 2015-05-04: 40 ug/min via INTRAVENOUS
  Filled 2015-05-04 (×3): qty 2

## 2015-05-04 MED ORDER — SODIUM CHLORIDE 0.9 % IJ SOLN
3.0000 mL | Freq: Two times a day (BID) | INTRAMUSCULAR | Status: DC
  Administered 2015-05-05 – 2015-05-07 (×5): 3 mL via INTRAVENOUS

## 2015-05-04 MED ORDER — SODIUM CHLORIDE 0.9 % IJ SOLN
INTRAMUSCULAR | Status: AC
  Filled 2015-05-04: qty 20

## 2015-05-04 MED ORDER — LACTATED RINGERS IV SOLN: 500.0000 mL | Freq: Once | INTRAVENOUS | Status: DC | PRN

## 2015-05-04 MED ORDER — HEPARIN SODIUM (PORCINE) 1000 UNIT/ML IJ SOLN
INTRAMUSCULAR | Status: AC
Start: 2015-05-04 — End: 2015-05-04
  Filled 2015-05-04: qty 1

## 2015-05-04 MED ORDER — SODIUM CHLORIDE 0.9 % IV SOLN
INTRAVENOUS | Status: DC
  Administered 2015-05-04: 20 mL/h via INTRAVENOUS

## 2015-05-04 MED ORDER — ASPIRIN EC 325 MG PO TBEC
325.0000 mg | DELAYED_RELEASE_TABLET | Freq: Every day | ORAL | Status: DC
  Administered 2015-05-05 – 2015-05-07 (×2): 325 mg via ORAL
  Filled 2015-05-04 (×2): qty 1

## 2015-05-04 MED ORDER — CHLORHEXIDINE GLUCONATE 0.12 % MT SOLN
15.0000 mL | OROMUCOSAL | Status: AC
  Administered 2015-05-04: 15 mL via OROMUCOSAL
  Filled 2015-05-04: qty 15

## 2015-05-04 MED ORDER — MORPHINE SULFATE (PF) 2 MG/ML IV SOLN
2.0000 mg | INTRAVENOUS | Status: DC | PRN
  Administered 2015-05-04 – 2015-05-05 (×2): 2 mg via INTRAVENOUS
  Administered 2015-05-05: 4 mg via INTRAVENOUS
  Administered 2015-05-05 (×2): 2 mg via INTRAVENOUS
  Administered 2015-05-05 (×6): 4 mg via INTRAVENOUS
  Administered 2015-05-06: 2 mg via INTRAVENOUS
  Administered 2015-05-06 – 2015-05-07 (×3): 4 mg via INTRAVENOUS
  Filled 2015-05-04: qty 2
  Filled 2015-05-04: qty 1
  Filled 2015-05-04 (×3): qty 2
  Filled 2015-05-04: qty 1
  Filled 2015-05-04: qty 2
  Filled 2015-05-04 (×3): qty 1
  Filled 2015-05-04 (×4): qty 2
  Filled 2015-05-04: qty 1
  Filled 2015-05-04: qty 2

## 2015-05-04 MED ORDER — ROCURONIUM BROMIDE 100 MG/10ML IV SOLN
INTRAVENOUS | Status: DC | PRN
  Administered 2015-05-04 (×3): 50 mg via INTRAVENOUS

## 2015-05-04 MED ORDER — MAGNESIUM SULFATE 4 GM/100ML IV SOLN
4.0000 g | Freq: Once | INTRAVENOUS | Status: AC
  Administered 2015-05-04: 4 g via INTRAVENOUS
  Filled 2015-05-04: qty 100

## 2015-05-04 MED ORDER — PROPOFOL 10 MG/ML IV BOLUS
INTRAVENOUS | Status: AC
  Filled 2015-05-04: qty 20

## 2015-05-04 MED ORDER — LIDOCAINE HCL (CARDIAC) 20 MG/ML IV SOLN
INTRAVENOUS | Status: DC | PRN
  Administered 2015-05-04: 20 mg via INTRAVENOUS

## 2015-05-04 MED ORDER — DEXTROSE 5 % IV SOLN
1.5000 g | Freq: Two times a day (BID) | INTRAVENOUS | Status: AC
  Administered 2015-05-04 – 2015-05-06 (×4): 1.5 g via INTRAVENOUS
  Filled 2015-05-04 (×5): qty 1.5

## 2015-05-04 MED ORDER — DOCUSATE SODIUM 100 MG PO CAPS
200.0000 mg | ORAL_CAPSULE | Freq: Every day | ORAL | Status: DC
  Administered 2015-05-05: 200 mg via ORAL
  Filled 2015-05-04: qty 2

## 2015-05-04 MED ORDER — ARTIFICIAL TEARS OP OINT
TOPICAL_OINTMENT | OPHTHALMIC | Status: DC | PRN
  Administered 2015-05-04: 1 via OPHTHALMIC

## 2015-05-04 MED ORDER — OXYCODONE HCL 5 MG PO TABS
5.0000 mg | ORAL_TABLET | ORAL | Status: DC | PRN
  Administered 2015-05-05 – 2015-05-07 (×9): 10 mg via ORAL
  Filled 2015-05-04 (×10): qty 2

## 2015-05-04 MED ORDER — LEVALBUTEROL HCL 0.63 MG/3ML IN NEBU
0.6300 mg | INHALATION_SOLUTION | RESPIRATORY_TRACT | Status: DC
Start: 2015-05-05 — End: 2015-05-04

## 2015-05-04 MED ORDER — LIDOCAINE HCL (CARDIAC) 20 MG/ML IV SOLN
INTRAVENOUS | Status: AC
  Filled 2015-05-04: qty 5

## 2015-05-04 MED ORDER — MORPHINE SULFATE (PF) 2 MG/ML IV SOLN
1.0000 mg | INTRAVENOUS | Status: AC | PRN
  Administered 2015-05-04 (×2): 2 mg via INTRAVENOUS
  Filled 2015-05-04: qty 1

## 2015-05-04 MED ORDER — LACTATED RINGERS IV SOLN
INTRAVENOUS | Status: DC
  Administered 2015-05-04: 10 mL/h via INTRAVENOUS
  Administered 2015-05-04: 21:00:00 via INTRAVENOUS

## 2015-05-04 MED ORDER — VECURONIUM BROMIDE 10 MG IV SOLR
INTRAVENOUS | Status: DC | PRN
  Administered 2015-05-04 (×2): 5 mg via INTRAVENOUS

## 2015-05-04 MED ORDER — ALBUMIN HUMAN 5 % IV SOLN
250.0000 mL | INTRAVENOUS | Status: AC | PRN
  Administered 2015-05-04 – 2015-05-05 (×3): 250 mL via INTRAVENOUS
  Filled 2015-05-04: qty 250

## 2015-05-04 MED ORDER — DEXMEDETOMIDINE HCL IN NACL 200 MCG/50ML IV SOLN
0.0000 ug/kg/h | INTRAVENOUS | Status: DC
  Administered 2015-05-04 (×3): 0.7 ug/kg/h via INTRAVENOUS
  Filled 2015-05-04 (×3): qty 50

## 2015-05-04 MED ORDER — FAMOTIDINE IN NACL 20-0.9 MG/50ML-% IV SOLN
20.0000 mg | Freq: Two times a day (BID) | INTRAVENOUS | Status: AC
  Administered 2015-05-04 (×2): 20 mg via INTRAVENOUS
  Filled 2015-05-04: qty 50

## 2015-05-04 MED ORDER — DOPAMINE-DEXTROSE 3.2-5 MG/ML-% IV SOLN: 2.0000 ug/kg/min | INTRAVENOUS | Status: DC

## 2015-05-04 MED ORDER — METOPROLOL TARTRATE 25 MG/10 ML ORAL SUSPENSION
12.5000 mg | Freq: Two times a day (BID) | ORAL | Status: DC
Start: 2015-05-04 — End: 2015-05-08

## 2015-05-04 MED ORDER — ROCURONIUM BROMIDE 50 MG/5ML IV SOLN
INTRAVENOUS | Status: AC
  Filled 2015-05-04: qty 1

## 2015-05-04 MED ORDER — PANTOPRAZOLE SODIUM 40 MG PO TBEC
40.0000 mg | DELAYED_RELEASE_TABLET | Freq: Every day | ORAL | Status: DC
  Administered 2015-05-05 – 2015-05-07 (×2): 40 mg via ORAL
  Filled 2015-05-04 (×2): qty 1

## 2015-05-04 MED ORDER — PROTAMINE SULFATE 10 MG/ML IV SOLN
INTRAVENOUS | Status: DC | PRN
  Administered 2015-05-04: 330 mg via INTRAVENOUS

## 2015-05-04 MED ORDER — BISACODYL 5 MG PO TBEC
10.0000 mg | DELAYED_RELEASE_TABLET | Freq: Every day | ORAL | Status: DC
  Administered 2015-05-05: 10 mg via ORAL
  Filled 2015-05-04: qty 2

## 2015-05-04 MED ORDER — HEMOSTATIC AGENTS (NO CHARGE) OPTIME
TOPICAL | Status: DC | PRN
  Administered 2015-05-04 (×3): 1 via TOPICAL

## 2015-05-04 MED ORDER — POTASSIUM CHLORIDE 10 MEQ/50ML IV SOLN: 10.0000 meq | INTRAVENOUS | Status: AC

## 2015-05-04 MED ORDER — ACETAMINOPHEN 160 MG/5ML PO SOLN
1000.0000 mg | Freq: Four times a day (QID) | ORAL | Status: DC
  Administered 2015-05-04: 1000 mg
  Filled 2015-05-04: qty 40.6

## 2015-05-04 MED ORDER — ASPIRIN 81 MG PO CHEW: 324.0000 mg | CHEWABLE_TABLET | Freq: Every day | ORAL | Status: DC

## 2015-05-04 MED ORDER — SODIUM CHLORIDE 0.9 % IV SOLN
INTRAVENOUS | Status: DC
  Administered 2015-05-04: 2.2 [IU]/h via INTRAVENOUS
  Filled 2015-05-04 (×2): qty 2.5

## 2015-05-04 MED ORDER — CHLORHEXIDINE GLUCONATE 0.12% ORAL RINSE (MEDLINE KIT)
15.0000 mL | Freq: Two times a day (BID) | OROMUCOSAL | Status: DC
  Administered 2015-05-04: 15 mL via OROMUCOSAL

## 2015-05-04 MED ORDER — DOPAMINE-DEXTROSE 1.6-5 MG/ML-% IV SOLN
INTRAVENOUS | Status: DC | PRN
  Administered 2015-05-04: 2 ug/kg/min via INTRAVENOUS

## 2015-05-04 MED ORDER — TRAMADOL HCL 50 MG PO TABS
50.0000 mg | ORAL_TABLET | ORAL | Status: DC | PRN
  Administered 2015-05-05: 50 mg via ORAL
  Filled 2015-05-04: qty 1

## 2015-05-04 MED ORDER — MIDAZOLAM HCL 10 MG/2ML IJ SOLN
INTRAMUSCULAR | Status: AC
  Filled 2015-05-04: qty 2

## 2015-05-04 MED FILL — Electrolyte-R (PH 7.4) Solution: INTRAVENOUS | Qty: 1000 | Status: AC

## 2015-05-04 MED FILL — Lidocaine HCl IV Inj 20 MG/ML: INTRAVENOUS | Qty: 10 | Status: AC

## 2015-05-04 MED FILL — Potassium Chloride Inj 2 mEq/ML: INTRAVENOUS | Qty: 40 | Status: AC

## 2015-05-04 MED FILL — Heparin Sodium (Porcine) Inj 1000 Unit/ML: INTRAMUSCULAR | Qty: 40 | Status: AC

## 2015-05-04 MED FILL — Mannitol IV Soln 20%: INTRAVENOUS | Qty: 500 | Status: AC

## 2015-05-04 MED FILL — Sodium Chloride IV Soln 0.9%: INTRAVENOUS | Qty: 2000 | Status: AC

## 2015-05-04 MED FILL — Heparin Sodium (Porcine) Inj 1000 Unit/ML: INTRAMUSCULAR | Qty: 30 | Status: AC

## 2015-05-04 MED FILL — Sodium Bicarbonate IV Soln 8.4%: INTRAVENOUS | Qty: 50 | Status: AC

## 2015-05-04 MED FILL — Magnesium Sulfate Inj 50%: INTRAMUSCULAR | Qty: 10 | Status: AC

## 2015-05-04 SURGICAL SUPPLY — 90 items
BAG DECANTER FOR FLEXI CONT (MISCELLANEOUS) ×3 IMPLANT
BANDAGE ELASTIC 4 VELCRO ST LF (GAUZE/BANDAGES/DRESSINGS) ×6 IMPLANT
BANDAGE ELASTIC 6 VELCRO ST LF (GAUZE/BANDAGES/DRESSINGS) ×6 IMPLANT
BLADE STERNUM SYSTEM 6 (BLADE) ×3 IMPLANT
BLADE SURG 11 STRL SS (BLADE) ×3 IMPLANT
BNDG GAUZE ELAST 4 BULKY (GAUZE/BANDAGES/DRESSINGS) ×6 IMPLANT
CABLE PACING FASLOC BIEGE (MISCELLANEOUS) ×3 IMPLANT
CANISTER SUCTION 2500CC (MISCELLANEOUS) ×3 IMPLANT
CATH CPB KIT GERHARDT (MISCELLANEOUS) ×3 IMPLANT
CATH THORACIC 28FR (CATHETERS) ×3 IMPLANT
CRADLE DONUT ADULT HEAD (MISCELLANEOUS) ×3 IMPLANT
DEVICE TROCAR PUNCTURE CLOSURE (ENDOMECHANICALS) ×3 IMPLANT
DRAIN CHANNEL 28F RND 3/8 FF (WOUND CARE) ×3 IMPLANT
DRAPE CARDIOVASCULAR INCISE (DRAPES) ×1
DRAPE SLUSH/WARMER DISC (DRAPES) ×3 IMPLANT
DRAPE SRG 135X102X78XABS (DRAPES) ×2 IMPLANT
DRSG AQUACEL AG ADV 3.5X14 (GAUZE/BANDAGES/DRESSINGS) ×3 IMPLANT
ELECT BLADE 4.0 EZ CLEAN MEGAD (MISCELLANEOUS) ×3
ELECT REM PT RETURN 9FT ADLT (ELECTROSURGICAL) ×6
ELECTRODE BLDE 4.0 EZ CLN MEGD (MISCELLANEOUS) ×2 IMPLANT
ELECTRODE REM PT RTRN 9FT ADLT (ELECTROSURGICAL) ×4 IMPLANT
GAUZE SPONGE 4X4 12PLY STRL (GAUZE/BANDAGES/DRESSINGS) ×6 IMPLANT
GLOVE BIO SURGEON STRL SZ 6.5 (GLOVE) ×18 IMPLANT
GLOVE BIO SURGEON STRL SZ7 (GLOVE) ×3 IMPLANT
GLOVE BIOGEL PI IND STRL 6 (GLOVE) ×2 IMPLANT
GLOVE BIOGEL PI IND STRL 6.5 (GLOVE) ×2 IMPLANT
GLOVE BIOGEL PI INDICATOR 6 (GLOVE) ×1
GLOVE BIOGEL PI INDICATOR 6.5 (GLOVE) ×1
GOWN STRL REUS W/ TWL LRG LVL3 (GOWN DISPOSABLE) ×8 IMPLANT
GOWN STRL REUS W/TWL LRG LVL3 (GOWN DISPOSABLE) ×4
HEMOSTAT POWDER SURGIFOAM 1G (HEMOSTASIS) ×9 IMPLANT
HEMOSTAT SURGICEL 2X14 (HEMOSTASIS) ×3 IMPLANT
KIT BASIN OR (CUSTOM PROCEDURE TRAY) ×3 IMPLANT
KIT CATH SUCT 8FR (CATHETERS) ×3 IMPLANT
KIT ROOM TURNOVER OR (KITS) ×3 IMPLANT
KIT SUCTION CATH 14FR (SUCTIONS) ×6 IMPLANT
KIT VASOVIEW W/TROCAR VH 2000 (KITS) ×3 IMPLANT
LEAD PACING MYOCARDI (MISCELLANEOUS) ×6 IMPLANT
LIQUID BAND (GAUZE/BANDAGES/DRESSINGS) ×6 IMPLANT
MARKER GRAFT CORONARY BYPASS (MISCELLANEOUS) ×9 IMPLANT
NS IRRIG 1000ML POUR BTL (IV SOLUTION) ×21 IMPLANT
PACK OPEN HEART (CUSTOM PROCEDURE TRAY) ×3 IMPLANT
PAD ARMBOARD 7.5X6 YLW CONV (MISCELLANEOUS) ×6 IMPLANT
PAD ELECT DEFIB RADIOL ZOLL (MISCELLANEOUS) ×3 IMPLANT
PENCIL BUTTON HOLSTER BLD 10FT (ELECTRODE) ×3 IMPLANT
PUNCH AORTIC ROTATE  4.5MM 8IN (MISCELLANEOUS) ×3 IMPLANT
SET CARDIOPLEGIA MPS 5001102 (MISCELLANEOUS) ×3 IMPLANT
SPONGE GAUZE 4X4 12PLY STER LF (GAUZE/BANDAGES/DRESSINGS) ×6 IMPLANT
SPONGE LAP 18X18 X RAY DECT (DISPOSABLE) ×9 IMPLANT
SUT BONE WAX W31G (SUTURE) ×3 IMPLANT
SUT ETHIBOND 2 0 SH (SUTURE) ×4
SUT ETHIBOND 2 0 SH 36X2 (SUTURE) ×8 IMPLANT
SUT MNCRL AB 4-0 PS2 18 (SUTURE) ×3 IMPLANT
SUT PROLENE 3 0 SH DA (SUTURE) ×3 IMPLANT
SUT PROLENE 3 0 SH1 36 (SUTURE) ×3 IMPLANT
SUT PROLENE 4 0 RB 1 (SUTURE) ×1
SUT PROLENE 4 0 TF (SUTURE) ×6 IMPLANT
SUT PROLENE 4-0 RB1 .5 CRCL 36 (SUTURE) ×2 IMPLANT
SUT PROLENE 5 0 C 1 36 (SUTURE) ×6 IMPLANT
SUT PROLENE 6 0 C 1 30 (SUTURE) ×12 IMPLANT
SUT PROLENE 6 0 CC (SUTURE) ×21 IMPLANT
SUT PROLENE 7 0 BV1 MDA (SUTURE) ×12 IMPLANT
SUT PROLENE 8 0 BV175 6 (SUTURE) ×6 IMPLANT
SUT SILK  1 MH (SUTURE) ×3
SUT SILK 1 MH (SUTURE) ×6 IMPLANT
SUT SILK 1 TIES 10X30 (SUTURE) ×3 IMPLANT
SUT SILK 2 0 SH CR/8 (SUTURE) ×6 IMPLANT
SUT SILK 2 0 TIES 10X30 (SUTURE) ×3 IMPLANT
SUT SILK 2 0 TIES 17X18 (SUTURE) ×1
SUT SILK 2-0 18XBRD TIE BLK (SUTURE) ×2 IMPLANT
SUT SILK 3 0 SH CR/8 (SUTURE) ×3 IMPLANT
SUT SILK 4 0 TIE 10X30 (SUTURE) ×6 IMPLANT
SUT STEEL 6MS V (SUTURE) ×3 IMPLANT
SUT STEEL SZ 6 DBL 3X14 BALL (SUTURE) ×3 IMPLANT
SUT TEM PAC WIRE 2 0 SH (SUTURE) ×12 IMPLANT
SUT VIC AB 1 CTX 18 (SUTURE) ×6 IMPLANT
SUT VIC AB 2-0 CT1 27 (SUTURE) ×1
SUT VIC AB 2-0 CT1 TAPERPNT 27 (SUTURE) ×2 IMPLANT
SUT VIC AB 2-0 CTX 27 (SUTURE) ×6 IMPLANT
SUT VIC AB 3-0 X1 27 (SUTURE) ×6 IMPLANT
SUTURE E-PAK OPEN HEART (SUTURE) ×3 IMPLANT
SYSTEM SAHARA CHEST DRAIN ATS (WOUND CARE) ×3 IMPLANT
TAPE CLOTH SURG 4X10 WHT LF (GAUZE/BANDAGES/DRESSINGS) ×3 IMPLANT
TAPE PAPER 2X10 WHT MICROPORE (GAUZE/BANDAGES/DRESSINGS) ×3 IMPLANT
TOWEL OR 17X24 6PK STRL BLUE (TOWEL DISPOSABLE) ×6 IMPLANT
TOWEL OR 17X26 10 PK STRL BLUE (TOWEL DISPOSABLE) ×6 IMPLANT
TRAY FOLEY IC TEMP SENS 16FR (CATHETERS) ×3 IMPLANT
TUBING INSUFFLATION (TUBING) ×3 IMPLANT
UNDERPAD 30X30 INCONTINENT (UNDERPADS AND DIAPERS) ×3 IMPLANT
WATER STERILE IRR 1000ML POUR (IV SOLUTION) ×6 IMPLANT

## 2015-05-04 NOTE — Procedures (Signed)
Extubation Procedure Note  Patient Details:   Name: Eduardo [name] DOB: December 09, 1945 MRN: [id number]   Airway Documentation:     Evaluation  O2 sats: stable throughout Complications: No apparent complications Patient did tolerate procedure well. Bilateral Breath Sounds: Expiratory wheezes, Diminished Suctioning: Oral, Airway Yes   Procedures was explain to pt prior to extubation. Pt passed all weaning parameters. NIF -26, VC 1.0L. Pt has an audible cuff leak prior to extubation. Pt was ask to take a deep breath in to expand his vocal cords before extubation. Pt nodded his head for understanding. Pt was extubated to a 5L Delta oxygen humidity provided. Pt was stable throughout extubation procedure. No complications or distress noted. Pt was able to state his name post extubation. Pt tolerating procedure well. O2 saturations are stable at this time. No complications noted   Leigh Aurora 05/04/2015, 11:50 PM

## 2015-05-04 NOTE — Progress Notes (Signed)
Weaning process is initiated RN aware. Pt is tolerating it well, no complications or distress noted. RT will continue to monitor.

## 2015-05-04 NOTE — Progress Notes (Signed)
Vent changes made per MD request. 

## 2015-05-04 NOTE — Progress Notes (Signed)
  Echocardiogram Echocardiogram Transesophageal has been performed.  Eduardo [name] 05/04/2015, 10:15 AM

## 2015-05-04 NOTE — Progress Notes (Signed)
Patient ID: Eduardo [name], male   DOB: 1946-02-12, 69 y.o.   MRN: [id number] EVENING ROUNDS NOTE :     [address]       [place], [address]             [phone]                 Day of Surgery Procedure(s) (LRB): CORONARY ARTERY BYPASS GRAFTING (CABG)  x four,  using left internal mammary artery, and bilateral thigh greater saphenous veins (N/A) TRANSESOPHAGEAL ECHOCARDIOGRAM (TEE) (N/A)  Total Length of Stay:  LOS: 6 days  BP 101/82 mmHg  Pulse 91  Temp(Src) 100.6 F (38.1 C) (Core (Comment))  Resp 16  Ht 5\' 8"  (1.727 m)  Wt 182 lb 4.8 oz (82.691 kg)  BMI 27.73 kg/m2  SpO2 97%  .Intake/Output      12/04 0701 - 12/05 0700 12/05 0701 - 12/06 0700   P.O. 1080    I.V. (mL/kg) 430 (5.2) 1376.8 (16.6)   Blood  785   IV Piggyback  350   Total Intake(mL/kg) 1510 (18.3) 2511.8 (30.4)   Urine (mL/kg/hr) 500 (0.3) 2240 (2.3)   Stool 1 (0)    Blood  1625 (1.7)   Chest Tube  90 (0.1)   Total Output 501 3955   Net +1009 -1443.2          . sodium chloride 20 mL/hr (05/04/15 1500)  . [START ON 05/05/2015] sodium chloride    . sodium chloride 20 mL/hr (05/04/15 1500)  . dexmedetomidine 0.7 mcg/kg/hr (05/04/15 1845)  . DOPamine 3 mcg/kg/min (05/04/15 1615)  . insulin (NOVOLIN-R) infusion 2.3 Units/hr (05/04/15 1800)  . lactated ringers 20 mL/hr (05/04/15 1500)  . lactated ringers 10 mL/hr (05/04/15 1500)  . nitroGLYCERIN Stopped (05/04/15 1430)  . phenylephrine (NEO-SYNEPHRINE) Adult infusion 40 mcg/min (05/04/15 1745)     Lab Results  Component Value Date   WBC 17.4* 05/04/2015   HGB 14.6 05/04/2015   HCT 42.3 05/04/2015   PLT 161 05/04/2015   GLUCOSE 146* 05/04/2015   CHOL 122 04/29/2015   TRIG 284* 04/29/2015   HDL 29* 04/29/2015   LDLCALC 36 04/29/2015   ALT 21 04/29/2015   AST 19 04/29/2015   NA 139 05/04/2015   K 4.2 05/04/2015   CL 97* 05/04/2015   CREATININE 0.70 05/04/2015   BUN 17 05/04/2015   CO2 27 05/04/2015   TSH 1.792  04/28/2015   INR 1.38 05/04/2015   HGBA1C 7.3* 04/28/2015   Dg Chest Port 1 View  05/04/2015  CLINICAL DATA:  Status post coronary artery bypass grafting. Hypoxia. EXAM: PORTABLE CHEST 1 VIEW COMPARISON:  Chest radiograph April 30, 2015; chest CT May 03, 2015 FINDINGS: Endotracheal tube tip is 3.8 cm above the carina. Swan-Ganz catheter tip is in the proximal right main pulmonary artery. Central catheter tip is in the superior vena cava. Nasogastric tube tip and side port are below the diaphragm. There is a left chest tube and a mediastinal drain. Temporary pacemaker wires are attached to the right heart. No pneumothorax. There is atelectatic change in the right mid lung. There is also atelectatic change in the left base with small left effusion. Lungs elsewhere clear. Heart is upper normal in size with pulmonary vascularity within normal limits. There is postoperative change in the right shoulder. There are foci of atherosclerotic calcification in left carotid artery. IMPRESSION: Tube and catheter positions as described without pneumothorax. Patchy atelectasis right mid lung and left base. Small  left effusion. No change in cardiac silhouette. Left carotid artery calcification. Electronically Signed   By: Lowella Grip III M.D.   On: 05/04/2015 14:56    Not bleeding Some wheezing Still on vent  Temp up   [name] MD  Beeper [phone] Office [phone] 05/04/2015 6:49 PM

## 2015-05-04 NOTE — Progress Notes (Signed)
Pt is on PS/CPAP tolerating it well, pulling good volumes, RN aware. RT will continue to monitor.

## 2015-05-04 NOTE — Progress Notes (Signed)
Pt arrived to 2S15 with tube at 21cm at the lips. Chest xray requested.

## 2015-05-04 NOTE — OR Nursing (Signed)
13:55 - 20 minute call to SICU

## 2015-05-04 NOTE — Anesthesia Procedure Notes (Signed)
Procedure Name: Intubation Date/Time: 05/04/2015 8:00 AM Performed by: Gaylene Brooks Pre-anesthesia Checklist: Patient identified, Timeout performed, Emergency Drugs available, Suction available and Patient being monitored Patient Re-evaluated:Patient Re-evaluated prior to inductionOxygen Delivery Method: Circle system utilized Preoxygenation: Pre-oxygenation with 100% oxygen Intubation Type: IV induction Ventilation: Mask ventilation without difficulty and Oral airway inserted - appropriate to patient size Laryngoscope Size: Glidescope Grade View: Grade I Tube type: Oral Tube size: 8.0 mm Number of attempts: 2 Airway Equipment and Method: Video-laryngoscopy Placement Confirmation: ETT inserted through vocal cords under direct vision,  breath sounds checked- equal and bilateral,  positive ETCO2 and CO2 detector Secured at: 22 cm Tube secured with: Tape Dental Injury: Teeth and Oropharynx as per pre-operative assessment  Comments: Dr. Tempie Hoist here to practice airway management. Easy mask with OA. DL by Dr. Jacinto Reap with Mac 4, no view. Esophageal intubation. Tube removed promptly. DL with Glidescope by Dr. B. Grade 1 view. AOI. ETCO2, BBS=. VSS

## 2015-05-04 NOTE — Transfer of Care (Signed)
Immediate Anesthesia Transfer of Care Note  Patient: Eduardo [name]  Procedure(s) Performed: Procedure(s): CORONARY ARTERY BYPASS GRAFTING (CABG)  x four,  using left internal mammary artery, and bilateral thigh greater saphenous veins (N/A) TRANSESOPHAGEAL ECHOCARDIOGRAM (TEE) (N/A)  Patient Location: ICU  Anesthesia Type:General  Level of Consciousness: Patient remains intubated per anesthesia plan  Airway & Oxygen Therapy: Patient remains intubated per anesthesia plan and Patient placed on Ventilator (see vital sign flow sheet for setting)  Post-op Assessment: Report given to RN and Post -op Vital signs reviewed and stable  Post vital signs: Reviewed and stable  Last Vitals:  Filed Vitals:   05/03/15 1953 05/04/15 0535  BP: 120/67 124/68  Pulse: 63 56  Temp: 36.6 C 36.5 C  Resp: 20 21    Complications: No apparent anesthesia complications

## 2015-05-04 NOTE — Brief Op Note (Addendum)
      [address]       [place] [address]             [phone]      05/04/2015  11:47 AM  PATIENT:  Rodena Goldmann  69 y.o. male  PRE-OPERATIVE DIAGNOSIS:  Multivessel CAD  POST-OPERATIVE DIAGNOSIS:  Multivessel CAD  PROCEDURE:  TRANSESOPHAGEAL ECHOCARDIOGRAM (TEE) MEDIAN STERNOTOMY for CORONARY ARTERY BYPASS GRAFTING (CABG)  x 4 (LIMA to LAD, SVG to DIAGONAL 2, SVG to OM2, SVG to PDA) using left internal mammary artery, and bilateral thigh greater saphenous veins   SURGEON:  Surgeon(s) and Role:    * [name], MD - Primary  PHYSICIAN ASSISTANT: Lars Pinks PA-C  ASSISTANTS: Kathrynn Running RNFA  ANESTHESIA:   general  EBL:  Total I/O In: 1000 [I.V.:1000] Out: 250 [Urine:250]  DRAINS: Chest tubes placed in the mediastinal and pleural spaces   COUNTS CORRECT:  YES  DICTATION: .Dragon Dictation  PLAN OF CARE: Admit to inpatient   PATIENT DISPOSITION:  ICU - intubated and hemodynamically stable.   Delay start of Pharmacological VTE agent (>24hrs) due to surgical blood loss or risk of bleeding: YES    BASELINE WEIGHT: 83 kgPPLICABLE:[id number]}

## 2015-05-04 NOTE — OR Nursing (Signed)
13:20 - 45 minute call to SICU

## 2015-05-04 NOTE — Anesthesia Preprocedure Evaluation (Addendum)
Anesthesia Evaluation  Patient identified by MRN, date of birth, ID band Patient awake    Reviewed: Allergy & Precautions, NPO status , Patient's Chart, lab work & pertinent test results, reviewed documented beta blocker date and time   Airway Mallampati: III  TM Distance: >3 FB Neck ROM: Full    Dental  (+) Teeth Intact, Dental Advisory Given   Pulmonary sleep apnea , COPD, former smoker,    breath sounds clear to auscultation       Cardiovascular hypertension, Pt. on medications and Pt. on home beta blockers + angina + CAD and + Peripheral Vascular Disease   Rhythm:Regular Rate:Normal  EF nl. Mild AI. Dilated aortic root.   Neuro/Psych negative neurological ROS     GI/Hepatic negative GI ROS, Neg liver ROS,   Endo/Other  diabetes, Type 2  Renal/GU negative Renal ROS     Musculoskeletal negative musculoskeletal ROS (+)   Abdominal   Peds  Hematology negative hematology ROS (+)   Anesthesia Other Findings   Reproductive/Obstetrics                           Lab Results  Component Value Date   WBC 5.8 05/04/2015   HGB 14.9 05/04/2015   HCT 43.9 05/04/2015   MCV 93.6 05/04/2015   PLT 185 05/04/2015   Lab Results  Component Value Date   CREATININE 0.91 05/04/2015   BUN 20 05/04/2015   NA 138 05/04/2015   K 4.8 05/04/2015   CL 105 05/04/2015   CO2 27 05/04/2015   Lab Results  Component Value Date   INR 1.11 04/29/2015   INR 1.08 04/28/2015   INR 1.11 03/18/2014    Anesthesia Physical Anesthesia Plan  ASA: IV  Anesthesia Plan: General   Post-op Pain Management:    Induction: Intravenous  Airway Management Planned: Oral ETT  Additional Equipment: Arterial line, CVP, TEE, PA Cath and Ultrasound Guidance Line Placement  Intra-op Plan:   Post-operative Plan: Post-operative intubation/ventilation  Informed Consent: I have reviewed the patients History and Physical,  chart, labs and discussed the procedure including the risks, benefits and alternatives for the proposed anesthesia with the patient or authorized representative who has indicated his/her understanding and acceptance.   Dental advisory given  Plan Discussed with: CRNA  Anesthesia Plan Comments:         Anesthesia Quick Evaluation

## 2015-05-04 NOTE — Anesthesia Postprocedure Evaluation (Signed)
Anesthesia Post Note  Patient: Eduardo [name]  Procedure(s) Performed: Procedure(s) (LRB): CORONARY ARTERY BYPASS GRAFTING (CABG)  x four,  using left internal mammary artery, and bilateral thigh greater saphenous veins (N/A) TRANSESOPHAGEAL ECHOCARDIOGRAM (TEE) (N/A)  Patient location during evaluation: PACU Anesthesia Type: General Level of consciousness: sedated and patient remains intubated per anesthesia plan Pain management: pain level controlled Vital Signs Assessment: post-procedure vital signs reviewed and stable Respiratory status: patient remains intubated per anesthesia plan Cardiovascular status: blood pressure returned to baseline Anesthetic complications: no    Last Vitals:  Filed Vitals:   05/04/15 1430 05/04/15 1500  BP: 93/66 94/68  Pulse: 91   Temp: 36.9 C 36.9 C  Resp: 10 15    Last Pain:  Filed Vitals:   05/04/15 1505  PainSc: 0-No pain                 Tiajuana Amass

## 2015-05-05 ENCOUNTER — Inpatient Hospital Stay (HOSPITAL_COMMUNITY): Payer: Medicare Other

## 2015-05-05 ENCOUNTER — Encounter (HOSPITAL_COMMUNITY): Payer: Self-pay | Admitting: Cardiothoracic Surgery

## 2015-05-05 DIAGNOSIS — Z951 Presence of aortocoronary bypass graft: Secondary | ICD-10-CM

## 2015-05-05 LAB — GLUCOSE, CAPILLARY
GLUCOSE-CAPILLARY: 125 mg/dL — AB (ref 65–99)
GLUCOSE-CAPILLARY: 115 mg/dL — AB (ref 65–99)
GLUCOSE-CAPILLARY: 103 mg/dL — AB (ref 65–99)
GLUCOSE-CAPILLARY: 111 mg/dL — AB (ref 65–99)
GLUCOSE-CAPILLARY: 142 mg/dL — AB (ref 65–99)
Glucose-Capillary: 100 mg/dL — ABNORMAL HIGH (ref 65–99)
Glucose-Capillary: 101 mg/dL — ABNORMAL HIGH (ref 65–99)
Glucose-Capillary: 107 mg/dL — ABNORMAL HIGH (ref 65–99)
Glucose-Capillary: 107 mg/dL — ABNORMAL HIGH (ref 65–99)
Glucose-Capillary: 107 mg/dL — ABNORMAL HIGH (ref 65–99)
Glucose-Capillary: 109 mg/dL — ABNORMAL HIGH (ref 65–99)
Glucose-Capillary: 110 mg/dL — ABNORMAL HIGH (ref 65–99)
Glucose-Capillary: 111 mg/dL — ABNORMAL HIGH (ref 65–99)
Glucose-Capillary: 119 mg/dL — ABNORMAL HIGH (ref 65–99)
Glucose-Capillary: 120 mg/dL — ABNORMAL HIGH (ref 65–99)
Glucose-Capillary: 139 mg/dL — ABNORMAL HIGH (ref 65–99)
Glucose-Capillary: 145 mg/dL — ABNORMAL HIGH (ref 65–99)
Glucose-Capillary: 156 mg/dL — ABNORMAL HIGH (ref 65–99)
Glucose-Capillary: 159 mg/dL — ABNORMAL HIGH (ref 65–99)
Glucose-Capillary: 99 mg/dL (ref 65–99)

## 2015-05-05 LAB — CBC
HCT: 37.9 % — ABNORMAL LOW (ref 39.0–52.0)
HEMATOCRIT: 36.6 % — AB (ref 39.0–52.0)
HEMOGLOBIN: 12.2 g/dL — AB (ref 13.0–17.0)
HEMOGLOBIN: 12.5 g/dL — AB (ref 13.0–17.0)
MCH: 31 pg (ref 26.0–34.0)
MCH: 31 pg (ref 26.0–34.0)
MCHC: 33.3 g/dL (ref 30.0–36.0)
MCHC: 33 g/dL (ref 30.0–36.0)
MCV: 93.1 fL (ref 78.0–100.0)
MCV: 94 fL (ref 78.0–100.0)
Platelets: 139 10*3/uL — ABNORMAL LOW (ref 150–400)
Platelets: 155 10*3/uL (ref 150–400)
RBC: 3.93 MIL/uL — AB (ref 4.22–5.81)
RBC: 4.03 MIL/uL — AB (ref 4.22–5.81)
RDW: 13.1 % (ref 11.5–15.5)
RDW: 13.1 % (ref 11.5–15.5)
WBC: 12.1 10*3/uL — ABNORMAL HIGH (ref 4.0–10.5)
WBC: 14.2 10*3/uL — ABNORMAL HIGH (ref 4.0–10.5)

## 2015-05-05 LAB — POCT I-STAT 3, ART BLOOD GAS (G3+)
ACID-BASE EXCESS: 2 mmol/L (ref 0.0–2.0)
Acid-base deficit: 1 mmol/L (ref 0.0–2.0)
BICARBONATE: 25.3 meq/L — AB (ref 20.0–24.0)
BICARBONATE: 27.3 meq/L — AB (ref 20.0–24.0)
O2 SAT: 93 %
O2 Saturation: 92 %
PCO2 ART: 48 mmHg — AB (ref 35.0–45.0)
PH ART: 7.335 — AB (ref 7.350–7.450)
PO2 ART: 73 mmHg — AB (ref 80.0–100.0)
PO2 ART: 73 mmHg — AB (ref 80.0–100.0)
Patient temperature: 37.9
Patient temperature: 38
TCO2: 27 mmol/L (ref 0–100)
TCO2: 29 mmol/L (ref 0–100)
pCO2 arterial: 47.8 mmHg — ABNORMAL HIGH (ref 35.0–45.0)
pH, Arterial: 7.369 (ref 7.350–7.450)

## 2015-05-05 LAB — CREATININE, SERUM
CREATININE: 1.09 mg/dL (ref 0.61–1.24)
GFR calc non Af Amer: >60 mL/min (ref >60)

## 2015-05-05 LAB — POCT I-STAT, CHEM 8
BUN: 19 mg/dL (ref 6–20)
CREATININE: 1 mg/dL (ref 0.61–1.24)
Calcium, Ion: 1.11 mmol/L — ABNORMAL LOW (ref 1.13–1.30)
Chloride: 102 mmol/L (ref 101–111)
Glucose, Bld: 164 mg/dL — ABNORMAL HIGH (ref 65–99)
HEMATOCRIT: 39 % (ref 39.0–52.0)
Hemoglobin: 13.3 g/dL (ref 13.0–17.0)
Potassium: 4.1 mmol/L (ref 3.5–5.1)
Sodium: 138 mmol/L (ref 135–145)
TCO2: 24 mmol/L (ref 0–100)

## 2015-05-05 LAB — BASIC METABOLIC PANEL
Anion gap: 6 (ref 5–15)
BUN: 13 mg/dL (ref 6–20)
CHLORIDE: 107 mmol/L (ref 101–111)
CO2: 27 mmol/L (ref 22–32)
Calcium: 7.8 mg/dL — ABNORMAL LOW (ref 8.9–10.3)
Creatinine, Ser: 0.78 mg/dL (ref 0.61–1.24)
GFR calc Af Amer: >60 mL/min (ref >60)
GFR calc non Af Amer: >60 mL/min (ref >60)
GLUCOSE: 113 mg/dL — AB (ref 65–99)
POTASSIUM: 3.9 mmol/L (ref 3.5–5.1)
Sodium: 140 mmol/L (ref 135–145)

## 2015-05-05 LAB — MAGNESIUM
MAGNESIUM: 2.3 mg/dL (ref 1.7–2.4)
Magnesium: 2.1 mg/dL (ref 1.7–2.4)

## 2015-05-05 MED ORDER — INSULIN DETEMIR 100 UNIT/ML ~~LOC~~ SOLN
10.0000 [IU] | Freq: Every day | SUBCUTANEOUS | Status: DC
  Administered 2015-05-06 – 2015-05-07 (×2): 10 [IU] via SUBCUTANEOUS
  Filled 2015-05-05 (×3): qty 0.1

## 2015-05-05 MED ORDER — INSULIN ASPART 100 UNIT/ML ~~LOC~~ SOLN
0.0000 [IU] | SUBCUTANEOUS | Status: DC
  Administered 2015-05-05 – 2015-05-06 (×4): 2 [IU] via SUBCUTANEOUS
  Administered 2015-05-06: 4 [IU] via SUBCUTANEOUS
  Administered 2015-05-06 (×2): 2 [IU] via SUBCUTANEOUS
  Administered 2015-05-06: 4 [IU] via SUBCUTANEOUS
  Administered 2015-05-07 – 2015-05-08 (×6): 2 [IU] via SUBCUTANEOUS

## 2015-05-05 MED ORDER — LEVALBUTEROL HCL 0.63 MG/3ML IN NEBU: 0.6300 mg | INHALATION_SOLUTION | RESPIRATORY_TRACT | Status: DC | PRN

## 2015-05-05 MED ORDER — CETYLPYRIDINIUM CHLORIDE 0.05 % MT LIQD
7.0000 mL | Freq: Two times a day (BID) | OROMUCOSAL | Status: DC
  Administered 2015-05-05 – 2015-05-10 (×8): 7 mL via OROMUCOSAL

## 2015-05-05 MED ORDER — INSULIN DETEMIR 100 UNIT/ML ~~LOC~~ SOLN
10.0000 [IU] | Freq: Once | SUBCUTANEOUS | Status: AC
  Administered 2015-05-05: 10 [IU] via SUBCUTANEOUS
  Filled 2015-05-05: qty 0.1

## 2015-05-05 NOTE — Progress Notes (Signed)
Patient ID: Eduardo [name], male   DOB: [birth year], 69 y.o.   MRN: [id number] TCTS DAILY ICU PROGRESS NOTE                   [address]            [place] [address]          [phone]   1 Day Post-Op Procedure(s) (LRB): CORONARY ARTERY BYPASS GRAFTING (CABG)  x four,  using left internal mammary artery, and bilateral thigh greater saphenous veins (N/A) TRANSESOPHAGEAL ECHOCARDIOGRAM (TEE) (N/A)  Total Length of Stay:  LOS: 7 days   Subjective: Awake and alert neuro intact  Objective: Vital signs in last 24 hours: Temp:  [98.4 F (36.9 C)-101.5 F (38.6 C)] 99.3 F (37.4 C) (12/06 0700) Pulse Rate:  [42-145] 93 (12/06 0700) Cardiac Rhythm:  [-] Normal sinus rhythm (12/06 0400) Resp:  [10-26] 13 (12/06 0700) BP: (81-121)/(48-82) 107/69 mmHg (12/06 0700) SpO2:  [86 %-100 %] 100 % (12/06 0700) FiO2 (%):  [40 %-50 %] 40 % (12/05 2312) Weight:  [201 lb 3.2 oz (91.264 kg)] 201 lb 3.2 oz (91.264 kg) (12/06 0500)  Filed Weights   05/03/15 0520 05/04/15 0535 05/05/15 0500  Weight: 191 lb 3.2 oz (86.728 kg) 182 lb 4.8 oz (82.691 kg) 201 lb 3.2 oz (91.264 kg)    Weight change: 18 lb 14.4 oz (8.573 kg)   Hemodynamic parameters for last 24 hours: PAP: (29-59)/(15-30) 34/18 mmHg CO:  [3.4 L/min-5.4 L/min] 4.7 L/min CI:  [1.7 L/min/m2-2.7 L/min/m2] 2.4 L/min/m2  Intake/Output from previous day: 12/05 0701 - 12/06 0700 In: 4424.1 [I.V.:2439.1; Blood:785; IV Piggyback:1200] Out: [id number] [Urine:3490; Blood:1625; Chest Tube:310]  Intake/Output this shift:    Current Meds: Scheduled Meds: . acetaminophen  1,000 mg Oral 4 times per day   Or  . acetaminophen (TYLENOL) oral liquid 160 mg/5 mL  1,000 mg Per Tube 4 times per day  . antiseptic oral rinse  7 mL Mouth Rinse BID  . aspirin EC  325 mg Oral Daily   Or  . aspirin  324 mg Per Tube Daily  . bisacodyl  10 mg Oral Daily   Or  . bisacodyl  10 mg Rectal Daily  . cefUROXime (ZINACEF)  IV  1.5 g  Intravenous Q12H  . docusate sodium  200 mg Oral Daily  . ezetimibe  10 mg Oral Daily  . fenofibrate  160 mg Oral Daily  . insulin aspart  0-24 Units Subcutaneous 6 times per day  . insulin detemir  10 Units Subcutaneous Once  . [START ON 05/06/2015] insulin detemir  10 Units Subcutaneous Daily  . insulin regular  0-10 Units Intravenous TID WC  . levalbuterol  0.63 mg Nebulization Q4H  . metoprolol tartrate  12.5 mg Oral BID   Or  . metoprolol tartrate  12.5 mg Per Tube BID  . pantoprazole  40 mg Oral Daily  . sertraline  100 mg Oral Daily  . sodium chloride  3 mL Intravenous Q12H  . tiotropium  18 mcg Inhalation Daily   Continuous Infusions: . sodium chloride 20 mL/hr (05/04/15 1500)  . sodium chloride    . sodium chloride 20 mL/hr (05/04/15 1500)  . dexmedetomidine Stopped (05/05/15 0000)  . DOPamine 3 mcg/kg/min (05/04/15 1615)  . insulin (NOVOLIN-R) infusion 1.8 Units/hr (05/05/15 0700)  . lactated ringers Stopped (05/04/15 2030)  . lactated ringers 20 mL/hr at 05/04/15 2030  . nitroGLYCERIN Stopped (05/04/15 1430)  . phenylephrine (NEO-SYNEPHRINE)  Adult infusion 15 mcg/min (05/05/15 0630)   PRN Meds:.sodium chloride, albumin human, lactated ringers, metoprolol, midazolam, morphine injection, ondansetron (ZOFRAN) IV, oxyCODONE, sodium chloride, traMADol  General appearance: alert, cooperative and no distress Neurologic: intact Heart: regular rate and rhythm, S1, S2 normal, no murmur, click, rub or gallop Lungs: diminished breath sounds bibasilar Abdomen: soft, non-tender; bowel sounds normal; no masses,  no organomegaly Extremities: extremities normal, atraumatic, no cyanosis or edema and Homans sign is negative, no sign of DVT Wound: sternum intact  Lab Results: CBC: Recent Labs  05/04/15 2118 05/05/15 0411  WBC 12.7* 12.1*  HGB 12.9* 12.2*  HCT 38.3* 36.6*  PLT 157 139*   BMET:  Recent Labs  05/04/15 0012  05/04/15 2113 05/04/15 2118 05/05/15 0411  NA 138   < > 139  --  140  K 4.8  < > 4.1  --  3.9  CL 105  < > 102  --  107  CO2 27  --   --   --  27  GLUCOSE 129*  < > 146*  --  113*  BUN 20  < > 16  --  13  CREATININE 0.91  < > 0.90 0.83 0.78  CALCIUM 9.4  --   --   --  7.8*  < > = values in this interval not displayed.  PT/INR:  Recent Labs  05/04/15 1440  LABPROT 17.1*  INR 1.38   Radiology: Dg Chest Port 1 View  05/05/2015  CLINICAL DATA:  Status post CABG on May 04, 2015 EXAM: PORTABLE CHEST 1 VIEW COMPARISON:  Portable chest x-ray of May 04, 2015 FINDINGS: The lungs remain hypoinflated but this has not worsened since extubation of the trachea. The left-sided chest tube is stable overlying the lateral aspect of the fifth rib. The cardiac silhouette remains enlarged. The pulmonary vascularity is not engorged. A small left pleural effusion and minimal left basilar atelectasis persist. There are 7 intact sternal wires. The Swan-Ganz catheter lies in the region of the proximal right main pulmonary artery. A right internal jugular venous catheter is in place with its tip overlying the proximal portion of the SVC. IMPRESSION: Fairly stable appearance of the hypoinflated lungs since extubation. There is small amount of left lower lobe atelectasis and tiny left pleural effusion. There are stable post CABG changes. There is no pulmonary edema or pneumothorax. Electronically Signed   By: David  Martinique M.D.   On: 05/05/2015 07:19   Dg Chest Port 1 View  05/04/2015  CLINICAL DATA:  Status post coronary artery bypass grafting. Hypoxia. EXAM: PORTABLE CHEST 1 VIEW COMPARISON:  Chest radiograph April 30, 2015; chest CT May 03, 2015 FINDINGS: Endotracheal tube tip is 3.8 cm above the carina. Swan-Ganz catheter tip is in the proximal right main pulmonary artery. Central catheter tip is in the superior vena cava. Nasogastric tube tip and side port are below the diaphragm. There is a left chest tube and a mediastinal drain. Temporary pacemaker wires  are attached to the right heart. No pneumothorax. There is atelectatic change in the right mid lung. There is also atelectatic change in the left base with small left effusion. Lungs elsewhere clear. Heart is upper normal in size with pulmonary vascularity within normal limits. There is postoperative change in the right shoulder. There are foci of atherosclerotic calcification in left carotid artery. IMPRESSION: Tube and catheter positions as described without pneumothorax. Patchy atelectasis right mid lung and left base. Small left effusion. No change in cardiac silhouette. Left  carotid artery calcification. Electronically Signed   By: Lowella Grip III M.D.   On: 05/04/2015 14:56     Assessment/Plan: S/P Procedure(s) (LRB): CORONARY ARTERY BYPASS GRAFTING (CABG)  x four,  using left internal mammary artery, and bilateral thigh greater saphenous veins (N/A) TRANSESOPHAGEAL ECHOCARDIOGRAM (TEE) (N/A) Mobilize Diuresis Diabetes control d/c tubes/lines Continue foley due to strict I&O, patient in ICU and urinary output monitoring See progression orders Renal function stable, H/H minimally decreased from preop  12.7 to 12.1    [name] 05/05/2015 7:32 AM

## 2015-05-05 NOTE — Progress Notes (Signed)
      [address]       [place] [address]             [phone]       POD # 1 CABG  Up in chair, no complaints  BP 123/75 mmHg  Pulse 93  Temp(Src) 98.3 F (36.8 C) (Oral)  Resp 21  Ht 5\' 8"  (1.727 m)  Wt 201 lb 3.2 oz (91.264 kg)  BMI 30.60 kg/m2  SpO2 100%   Intake/Output Summary (Last 24 hours) at 05/05/15 1721 Last data filed at 05/05/15 1600  Gross per 24 hour  Intake 2542.18 ml  Output   2065 ml  Net 477.18 ml    UO relatively low over last few hours but creatinine normal and lytes OK- follow  Remo Lipps C. Roxan Hockey, MD [facility] [phone]

## 2015-05-06 ENCOUNTER — Inpatient Hospital Stay (HOSPITAL_COMMUNITY): Payer: Medicare Other

## 2015-05-06 LAB — PROTIME-INR
INR: 1.28 (ref 0.00–1.49)
Prothrombin Time: 16.2 seconds — ABNORMAL HIGH (ref 11.6–15.2)

## 2015-05-06 LAB — BASIC METABOLIC PANEL
Anion gap: 10 (ref 5–15)
Anion gap: 5 (ref 5–15)
BUN: 26 mg/dL — ABNORMAL HIGH (ref 6–20)
BUN: 35 mg/dL — ABNORMAL HIGH (ref 6–20)
CO2: 26 mmol/L (ref 22–32)
CO2: 27 mmol/L (ref 22–32)
Calcium: 7.9 mg/dL — ABNORMAL LOW (ref 8.9–10.3)
Calcium: 7.7 mg/dL — ABNORMAL LOW (ref 8.9–10.3)
Chloride: 98 mmol/L — ABNORMAL LOW (ref 101–111)
Chloride: 101 mmol/L (ref 101–111)
Creatinine, Ser: 1.94 mg/dL — ABNORMAL HIGH (ref 0.61–1.24)
Creatinine, Ser: 1.36 mg/dL — ABNORMAL HIGH (ref 0.61–1.24)
GFR calc Af Amer: 39 mL/min — ABNORMAL LOW (ref >60)
GFR calc Af Amer: 59 mL/min — ABNORMAL LOW (ref >60)
GFR calc non Af Amer: 33 mL/min — ABNORMAL LOW (ref >60)
GFR calc non Af Amer: 51 mL/min — ABNORMAL LOW (ref >60)
Glucose, Bld: 177 mg/dL — ABNORMAL HIGH (ref 65–99)
Glucose, Bld: 167 mg/dL — ABNORMAL HIGH (ref 65–99)
Potassium: 5.1 mmol/L (ref 3.5–5.1)
Potassium: 4.3 mmol/L (ref 3.5–5.1)
Sodium: 134 mmol/L — ABNORMAL LOW (ref 135–145)
Sodium: 133 mmol/L — ABNORMAL LOW (ref 135–145)

## 2015-05-06 LAB — MRSA PCR SCREENING: MRSA by PCR: NEGATIVE

## 2015-05-06 LAB — CBC
HCT: 36 % — ABNORMAL LOW (ref 39.0–52.0)
HCT: 32.1 % — ABNORMAL LOW (ref 39.0–52.0)
Hemoglobin: 11.8 g/dL — ABNORMAL LOW (ref 13.0–17.0)
Hemoglobin: 10.7 g/dL — ABNORMAL LOW (ref 13.0–17.0)
MCH: 31.2 pg (ref 26.0–34.0)
MCH: 31.1 pg (ref 26.0–34.0)
MCHC: 32.8 g/dL (ref 30.0–36.0)
MCHC: 33.3 g/dL (ref 30.0–36.0)
MCV: 95.2 fL (ref 78.0–100.0)
MCV: 93.3 fL (ref 78.0–100.0)
Platelets: 147 10*3/uL — ABNORMAL LOW (ref 150–400)
Platelets: 140 10*3/uL — ABNORMAL LOW (ref 150–400)
RBC: 3.78 MIL/uL — ABNORMAL LOW (ref 4.22–5.81)
RBC: 3.44 MIL/uL — ABNORMAL LOW (ref 4.22–5.81)
RDW: 13.3 % (ref 11.5–15.5)
RDW: 13.1 % (ref 11.5–15.5)
WBC: 16.7 10*3/uL — ABNORMAL HIGH (ref 4.0–10.5)
WBC: 11.4 10*3/uL — ABNORMAL HIGH (ref 4.0–10.5)

## 2015-05-06 LAB — GLUCOSE, CAPILLARY
GLUCOSE-CAPILLARY: 162 mg/dL — AB (ref 65–99)
GLUCOSE-CAPILLARY: 147 mg/dL — AB (ref 65–99)
GLUCOSE-CAPILLARY: 127 mg/dL — AB (ref 65–99)
Glucose-Capillary: 144 mg/dL — ABNORMAL HIGH (ref 65–99)
Glucose-Capillary: 176 mg/dL — ABNORMAL HIGH (ref 65–99)

## 2015-05-06 MED ORDER — METOCLOPRAMIDE HCL 5 MG/ML IJ SOLN
10.0000 mg | Freq: Four times a day (QID) | INTRAMUSCULAR | Status: DC
  Administered 2015-05-06 – 2015-05-08 (×9): 10 mg via INTRAVENOUS
  Filled 2015-05-06 (×9): qty 2

## 2015-05-06 MED ORDER — FUROSEMIDE 10 MG/ML IJ SOLN
40.0000 mg | Freq: Once | INTRAMUSCULAR | Status: AC
  Administered 2015-05-06: 40 mg via INTRAVENOUS
  Filled 2015-05-06: qty 4

## 2015-05-06 MED ORDER — SODIUM CHLORIDE 0.9 % IV SOLN
INTRAVENOUS | Status: DC
  Administered 2015-05-06: 08:00:00 via INTRAVENOUS

## 2015-05-06 MED ORDER — DOPAMINE-DEXTROSE 3.2-5 MG/ML-% IV SOLN
2.5000 ug/kg/min | INTRAVENOUS | Status: DC
  Administered 2015-05-06: 2.5 ug/kg/min via INTRAVENOUS
  Filled 2015-05-06: qty 250

## 2015-05-06 MED ORDER — ENOXAPARIN SODIUM 30 MG/0.3ML ~~LOC~~ SOLN
30.0000 mg | SUBCUTANEOUS | Status: DC
  Administered 2015-05-06 – 2015-05-10 (×5): 30 mg via SUBCUTANEOUS
  Filled 2015-05-06 (×5): qty 0.3

## 2015-05-06 MED ORDER — TRAMADOL HCL 50 MG PO TABS
50.0000 mg | ORAL_TABLET | Freq: Two times a day (BID) | ORAL | Status: DC
  Administered 2015-05-06 – 2015-05-07 (×3): 100 mg via ORAL
  Filled 2015-05-06 (×3): qty 2

## 2015-05-06 NOTE — Op Note (Signed)
NAME:  Eduardo [name], SCALA NO.:  [id number]  MEDICAL RECORD NO.:  [id number]  LOCATION:  2S11C                        FACILITY:  [facility]  PHYSICIAN:  Lanelle Bal, MD    DATE OF BIRTH:  08/24/1945  DATE OF PROCEDURE:  05/04/2015 DATE OF DISCHARGE:                              OPERATIVE REPORT   PREOPERATIVE DIAGNOSIS:  Unstable angina with 3-vessel coronary artery disease.  POSTOPERATIVE DIAGNOSIS:  Unstable angina with 3-vessel coronary artery disease.  SURGICAL PROCEDURE:  Coronary artery bypass grafting x4 with the left internal mammary to the left anterior descending coronary artery, reverse saphenous vein graft to the 2nd diagonal coronary artery, reverse saphenous vein graft to the first obtuse marginal, reverse saphenous vein graft to the posterior descending coronary artery with bilateral greater saphenous thigh endoscopic vein harvesting.  SURGEON:  Lanelle Bal, MD  FIRST ASSISTANT:  Lars Pinks, PA  BRIEF HISTORY:  The patient is a 69 year old male who presents with unstable anginal symptoms to [facility].  The week previously, he had been seen at the [place], [place].  A CT scan of the chest was done and the patient was told that he had coronary calcifications on a CT scan and a mildly dilated aorta.  On admission to [facility], he was stabilized medically, underwent cardiac catheterization by Dr. Irish Lack, which showed complex LAD lesion involving the LAD and 2nd diagonal that was greater than 80%.  There was also significant disease in the circumflex coronary artery with a large 1st OM and a smaller distal circumflex branch.  The right coronary artery was a large tortuous vessel with significant disease in the proximal 3rd and also calcification in the distal right coronary artery with greater than 80% stenosis.  Records from the [place] of the x-ray report were obtained, unfortunately [facility] never sent the [facility] scan.  Disk was requested.  Repeat  noncontrasted CT of the chest was done which showed his ascending aorta to be dilated approximately 4.2, with dilatation at the sinus of Valsalva approximately 4.6-4.7, this was noncontrast study.  He had very minimal aortic insufficiency by echo.  Coronary artery bypass grafting was recommended to the patient.  Risks and options were discussed.  The patient was agreeable with proceeding.  With the lack of aortic insufficiency, the tricuspid aortic valve and mild ascending aortic dilatation and the patient's age and underlying pulmonary disease, we elected not to replace his ascending aorta.  DESCRIPTION OF PROCEDURE:  With Swan-Ganz and arterial line monitors in place, the patient underwent general endotracheal anesthesia without incident.  Skin of chest and legs was prepped with Betadine and draped in usual sterile manner.  Using the Guidant endovein, appropriate time- out was performed.  Using the Guidant endovein harvesting system, the right greater saphenous thigh vein was harvested, as this vein went below the knee, it became small.  So, additional segment of vein was harvested from the left greater saphenous vein.  Ultimately, we obtained sufficient vein of adequate quality for bypass.  Median sternotomy was performed.  Left internal mammary artery was dissected down as pedicle graft.  The distal artery was divided had good free flow.  Pericardium was opened.  Overall, ventricular function appeared preserved.  The patient was systemically heparinized.  As noted by the CT scan, the ascending aorta was very mildly dilated, but appeared normal in consistency without significant calcifications.  The patient was systemically heparinized.  Ascending aorta was cannulated.  The right atrium was cannulated and aortic root vent cardioplegia needle was introduced into the ascending aorta.  The patient was placed on cardiopulmonary bypass 2.4 L/min/m2.  Sites anastomosed were selected and  dissected out of the epicardium.  The patient's body temperature was cooled to 32 degrees.  Aortic crossclamp was applied and 500 mL of cold blood potassium cardioplegia was administered antegrade.  Myocardial septal temperature was monitored throughout the crossclamp.  Attention was turned first to the proximal posterior descending coronary artery, which was opened and admitted a 1.5 mm probe distally.  Using a running 7-0 Prolene, distal anastomosis was performed with a second reverse saphenous vein graft.  The heart was then elevated.  The distal circumflex was relatively small vessel.  The obtuse marginal was larger, this vessel was opened.  Admitted a 1.5 mm probe distally and proximally, using a running 7-0 Prolene, distal anastomosis was performed.  Attention was then turned to the 2nd diagonal which was a small vessel was opened, admitted a 1 mm probe distally.  Using a running 7-0 Prolene, segment reverse saphenous vein graft was anastomosed to the 2nd diagonal vessel.  We then turned our attention to the left anterior descending coronary artery.  The proximal 2/3rd of this vessel was intramyocardial at the distal third.  The vessel was identified and opened and admitted a 1.5 mm probe proximally and a 1 mm probe distally.  Using a running 8-0 Prolene, left internal mammary artery was anastomosed to left anterior descending coronary artery. With the crossclamp still in place and after given additional cold blood cardioplegia down the vein grafts, each was sewn to ascending aorta after making 3 punch aortotomies.  The bulldog was removed from the mammary artery with rise in myocardial septal temperature.  The heart was allowed to passively fill and de-airing the aortotomies and the proximal anastomoses were completed.  Air was evacuated from the grafts. With the total pump time of 95 minutes, the patient did require ventricular pacing initially after cardioversion because of heart  block with the body temperature rewarmed to 37 degrees.  Sites of anastomosis were inspected and were free of bleeding.  The patient was then ventilated and weaned from cardiopulmonary bypass.  He remained hemodynamically stable.  He was decannulated in usual fashion. Protamine sulfate was administered with operative field hemostatic. Atrial and ventricular pacing wires had been applied.  Graft markers were applied.  A left pleural tube and a Blake mediastinal drain were left in place.  Pericardium was loosely reapproximated.  Sternum was closed with #6 stainless steel wire.  Fascia closed with interrupted 0 Vicryl, running 3-0 Vicryl, subcutaneous tissue for subcuticular stitch in skin edges.  Dry dressings were applied.  Sponge and needle count was reported as correct at completion of procedure.  The patient tolerated the procedure without obvious complication.  He did not require any blood bank blood products during the operative procedure.  Total crossclamp time was 95 minutes.  Total pump time 147 minutes.     Lanelle Bal, MD     EG/MEDQ  D:  05/05/2015  T:  05/05/2015  Job:  [id number]

## 2015-05-06 NOTE — Progress Notes (Addendum)
TCTS DAILY ICU PROGRESS NOTE                   [address]            [place] [address]          [phone]   2 Days Post-Op Procedure(s) (LRB): CORONARY ARTERY BYPASS GRAFTING (CABG)  x four,  using left internal mammary artery, and bilateral thigh greater saphenous veins (N/A) TRANSESOPHAGEAL ECHOCARDIOGRAM (TEE) (N/A)  Total Length of Stay:  LOS: 8 days   Subjective:  Eduardo [name] does not feel well this morning.  He is nausea and has vomited.  He has not moved his bowels since surgery.  Objective: Vital signs in last 24 hours: Temp:  [98.3 F (36.8 C)-98.9 F (37.2 C)] 98.5 F (36.9 C) (12/07 0349) Pulse Rate:  [66-95] 75 (12/07 0700) Cardiac Rhythm:  [-] Normal sinus rhythm (12/07 0400) Resp:  [11-27] 20 (12/07 0700) BP: (98-149)/(58-105) 133/74 mmHg (12/07 0700) SpO2:  [92 %-100 %] 97 % (12/07 0700) Weight:  [203 lb 7.8 oz (92.3 kg)] 203 lb 7.8 oz (92.3 kg) (12/07 0500)  Filed Weights   05/04/15 0535 05/05/15 0500 05/06/15 0500  Weight: 182 lb 4.8 oz (82.691 kg) 201 lb 3.2 oz (91.264 kg) 203 lb 7.8 oz (92.3 kg)    Weight change: 2 lb 4.6 oz (1.036 kg)   Intake/Output from previous day: 12/06 0701 - 12/07 0700 In: 1771.1 [P.O.:1140; I.V.:531.1; IV Piggyback:100] Out: 1050 [Urine:930; Chest Tube:120]  Current Meds: Scheduled Meds: . acetaminophen  1,000 mg Oral 4 times per day   Or  . acetaminophen (TYLENOL) oral liquid 160 mg/5 mL  1,000 mg Per Tube 4 times per day  . antiseptic oral rinse  7 mL Mouth Rinse BID  . aspirin EC  325 mg Oral Daily   Or  . aspirin  324 mg Per Tube Daily  . bisacodyl  10 mg Oral Daily   Or  . bisacodyl  10 mg Rectal Daily  . cefUROXime (ZINACEF)  IV  1.5 g Intravenous Q12H  . docusate sodium  200 mg Oral Daily  . enoxaparin (LOVENOX) injection  30 mg Subcutaneous Q24H  . ezetimibe  10 mg Oral Daily  . fenofibrate  160 mg Oral Daily  . insulin aspart  0-24 Units Subcutaneous 6 times per day  . insulin  detemir  10 Units Subcutaneous Daily  . metoCLOPramide (REGLAN) injection  10 mg Intravenous 4 times per day  . metoprolol tartrate  12.5 mg Oral BID   Or  . metoprolol tartrate  12.5 mg Per Tube BID  . pantoprazole  40 mg Oral Daily  . sertraline  100 mg Oral Daily  . sodium chloride  3 mL Intravenous Q12H  . tiotropium  18 mcg Inhalation Daily  . traMADol  50-100 mg Oral Q12H   Continuous Infusions: . sodium chloride Stopped (05/05/15 0900)  . sodium chloride    . sodium chloride 20 mL/hr (05/04/15 1500)  . sodium chloride 20 mL/hr at 05/06/15 0745  . dexmedetomidine Stopped (05/05/15 0000)  . DOPamine 2.5 mcg/kg/min (05/06/15 0700)  . lactated ringers Stopped (05/04/15 2030)  . lactated ringers 20 mL/hr at 05/06/15 0400  . nitroGLYCERIN Stopped (05/04/15 1430)  . phenylephrine (NEO-SYNEPHRINE) Adult infusion Stopped (05/05/15 0800)   PRN Meds:.sodium chloride, lactated ringers, levalbuterol, metoprolol, midazolam, morphine injection, ondansetron (ZOFRAN) IV, oxyCODONE, sodium chloride  General appearance: alert, cooperative and no distress Heart: regular rate and rhythm Lungs:  clear to auscultation bilaterally Abdomen: + distention, no BS, non-tender Extremities: edema trace Wound: clean and dry  Lab Results: CBC: Recent Labs  05/05/15 1620 05/05/15 1622 05/06/15 0445  WBC 14.2*  --  16.7*  HGB 12.5* 13.3 11.8*  HCT 37.9* 39.0 36.0*  PLT 155  --  147*   BMET:  Recent Labs  05/05/15 0411  05/05/15 1622 05/06/15 0445  NA 140  --  138 134*  K 3.9  --  4.1 5.1  CL 107  --  102 98*  CO2 27  --   --  26  GLUCOSE 113*  --  164* 177*  BUN 13  --  19 26*  CREATININE 0.78  < > 1.00 1.94*  CALCIUM 7.8*  --   --  7.9*  < > = values in this interval not displayed.  PT/INR:  Recent Labs  05/06/15 0445  LABPROT 16.2*  INR 1.28   Radiology: Dg Chest Port 1 View  05/06/2015  CLINICAL DATA:  Status post CABG on May 04, 2015 EXAM: PORTABLE CHEST 1 VIEW  COMPARISON:  Portable chest x-ray of May 05, 2015 FINDINGS: The lungs remain hypo inflated. The retrocardiac region is better aerated today. There is there remains partial obscuration of the left hemidiaphragm. There is no pneumothorax. The cardiac silhouette remains enlarged. The pulmonary vascularity is not engorged. The left chest tube is unchanged in position overlying the lateral aspect of the fifth rib. The right internal jugular Cordis sheath and adjacent catheter are unchanged with the tip of the Cordis sheath at the junction of the right internal jugular vein with the right subclavian vein and with the venous catheter tip projected over the proximal SVC. There are 7 intact sternal wires and 3 coronary artery bypass graft markers. IMPRESSION: Slight interval improvement in left lower lobe atelectasis. Persistent trace left pleural effusion. There is no pneumothorax nor evidence of CHF. Electronically Signed   By: David  Martinique M.D.   On: 05/06/2015 07:25     Assessment/Plan: S/P Procedure(s) (LRB): CORONARY ARTERY BYPASS GRAFTING (CABG)  x four,  using left internal mammary artery, and bilateral thigh greater saphenous veins (N/A) TRANSESOPHAGEAL ECHOCARDIOGRAM (TEE) (N/A)  1. CV- started on Dopamine for elevated creatinine this morning, maintaining NSR 2. Pulm- wean oxygen as tolerated, Chest tube with minimal output- will d/c today... CXR with atelectasis, small left pleural effusion 3. Renal- creatinine bumped to 1.94, restarted on Dopamine, IV Lasix ordered, K is starting to trend up at 5.1, will not supplement 4. GI- + nausea, vomiting.... Could be early Ileus, stomach is distended... Will get ABD film, add reglan for nausea 5. CBGS controlled- continue SSIP for now 6. Dispo- patient with elevated creatinine this morning, back on Dopamine, + nausea/vomiting with abdominal distention... Could be ileus. Will get ABD film, continue current care     Ellwood Handler 05/06/2015 8:07  AM  I have seen and examined Eduardo [name] and agree with the above assessment  and plan.  [name] MD Beeper [phone] Office [phone] 05/06/2015 8:33 AM

## 2015-05-06 NOTE — Progress Notes (Signed)
Patient ID: Eduardo [name], male   DOB: [birth year], 69 y.o.   MRN: [id number]  SICU Evening Rounds:  Hemodynamically stable.  On dopamine 2.5 mcg due to elevated creat 1.94. Evening labs pending  Urine output ok.  Aquacel dressing removed due to saturation with drainage today.

## 2015-05-06 NOTE — Care Management Important Message (Signed)
Important Message  Patient Details  Name: Eduardo [name] MRN: [id number] Date of Birth: 11-05-1945   Medicare Important Message Given:  Yes    Nathen May 05/06/2015, 11:47 AM

## 2015-05-07 ENCOUNTER — Inpatient Hospital Stay (HOSPITAL_COMMUNITY): Payer: Medicare Other

## 2015-05-07 ENCOUNTER — Other Ambulatory Visit: Payer: Self-pay

## 2015-05-07 LAB — CBC
HCT: 32.4 % — ABNORMAL LOW (ref 39.0–52.0)
Hemoglobin: 10.7 g/dL — ABNORMAL LOW (ref 13.0–17.0)
MCH: 31.1 pg (ref 26.0–34.0)
MCHC: 33 g/dL (ref 30.0–36.0)
MCV: 94.2 fL (ref 78.0–100.0)
Platelets: 140 10*3/uL — ABNORMAL LOW (ref 150–400)
RBC: 3.44 MIL/uL — ABNORMAL LOW (ref 4.22–5.81)
RDW: 13.2 % (ref 11.5–15.5)
WBC: 11.3 10*3/uL — ABNORMAL HIGH (ref 4.0–10.5)

## 2015-05-07 LAB — GLUCOSE, CAPILLARY
GLUCOSE-CAPILLARY: 109 mg/dL — AB (ref 65–99)
GLUCOSE-CAPILLARY: 136 mg/dL — AB (ref 65–99)
GLUCOSE-CAPILLARY: 145 mg/dL — AB (ref 65–99)
Glucose-Capillary: 154 mg/dL — ABNORMAL HIGH (ref 65–99)
Glucose-Capillary: 114 mg/dL — ABNORMAL HIGH (ref 65–99)
Glucose-Capillary: 147 mg/dL — ABNORMAL HIGH (ref 65–99)
Glucose-Capillary: 92 mg/dL (ref 65–99)

## 2015-05-07 LAB — MAGNESIUM: Magnesium: 2.3 mg/dL (ref 1.7–2.4)

## 2015-05-07 LAB — BASIC METABOLIC PANEL
Anion gap: 6 (ref 5–15)
BUN: 35 mg/dL — ABNORMAL HIGH (ref 6–20)
CO2: 28 mmol/L (ref 22–32)
Calcium: 7.9 mg/dL — ABNORMAL LOW (ref 8.9–10.3)
Chloride: 100 mmol/L — ABNORMAL LOW (ref 101–111)
Creatinine, Ser: 1.05 mg/dL (ref 0.61–1.24)
GFR calc Af Amer: >60 mL/min (ref >60)
GFR calc non Af Amer: >60 mL/min (ref >60)
Glucose, Bld: 160 mg/dL — ABNORMAL HIGH (ref 65–99)
Potassium: 4.4 mmol/L (ref 3.5–5.1)
Sodium: 134 mmol/L — ABNORMAL LOW (ref 135–145)

## 2015-05-07 MED ORDER — AMIODARONE HCL IN DEXTROSE 360-4.14 MG/200ML-% IV SOLN
60.0000 mg/h | INTRAVENOUS | Status: AC
  Administered 2015-05-07 (×2): 60 mg/h via INTRAVENOUS
  Filled 2015-05-07 (×2): qty 200

## 2015-05-07 MED ORDER — AMIODARONE HCL IN DEXTROSE 360-4.14 MG/200ML-% IV SOLN: 30.0000 mg/h | INTRAVENOUS | Status: DC

## 2015-05-07 MED ORDER — AMIODARONE HCL 200 MG PO TABS
200.0000 mg | ORAL_TABLET | Freq: Two times a day (BID) | ORAL | Status: DC
  Administered 2015-05-07 – 2015-05-10 (×6): 200 mg via ORAL
  Filled 2015-05-07 (×6): qty 1

## 2015-05-07 MED ORDER — FUROSEMIDE 10 MG/ML IJ SOLN
40.0000 mg | Freq: Once | INTRAMUSCULAR | Status: AC
  Administered 2015-05-07: 40 mg via INTRAVENOUS
  Filled 2015-05-07: qty 4

## 2015-05-07 MED ORDER — AMIODARONE HCL 200 MG PO TABS: 200.0000 mg | ORAL_TABLET | Freq: Every day | ORAL | Status: DC

## 2015-05-07 NOTE — Progress Notes (Signed)
  Amiodarone Drug - Drug Interaction Consult Note  Recommendations: -No recommendations at this time  Amiodarone is metabolized by the cytochrome P450 system and therefore has the potential to cause many drug interactions. Amiodarone has an average plasma half-life of 50 days (range 20 to 100 days).   There is potential for drug interactions to occur several weeks or months after stopping treatment and the onset of drug interactions may be slow after initiating amiodarone.   []  Statins: Increased risk of myopathy. Simvastatin- restrict dose to 20mg  daily. Other statins: counsel patients to report any muscle pain or weakness immediately.  []  Anticoagulants: Amiodarone can increase anticoagulant effect. Consider warfarin dose reduction. Patients should be monitored closely and the dose of anticoagulant altered accordingly, remembering that amiodarone levels take several weeks to stabilize.  []  Antiepileptics: Amiodarone can increase plasma concentration of phenytoin, the dose should be reduced. Note that small changes in phenytoin dose can result in large changes in levels. Monitor patient and counsel on signs of toxicity.  [x]  Beta blockers: increased risk of bradycardia, AV block and myocardial depression. Sotalol - avoid concomitant use.  []   Calcium channel blockers (diltiazem and verapamil): increased risk of bradycardia, AV block and myocardial depression.  []   Cyclosporine: Amiodarone increases levels of cyclosporine. Reduced dose of cyclosporine is recommended.  []  Digoxin dose should be halved when amiodarone is started.  []  Diuretics: increased risk of cardiotoxicity if hypokalemia occurs.  []  Oral hypoglycemic agents (glyburide, glipizide, glimepiride): increased risk of hypoglycemia. Patient's glucose levels should be monitored closely when initiating amiodarone therapy.   []  Drugs that prolong the QT interval:  Torsades de pointes risk may be increased with concurrent use -  avoid if possible.  Monitor QTc, also keep magnesium/potassium WNL if concurrent therapy can't be avoided. [name] Antibiotics: e.g. fluoroquinolones, erythromycin. . Antiarrhythmics: e.g. quinidine, procainamide, disopyramide, sotalol. . Antipsychotics: e.g. phenothiazines, haloperidol.  . Lithium, tricyclic antidepressants, and methadone.  Thank You,   Hildred Laser, Pharm D 05/07/2015 10:06 AM

## 2015-05-07 NOTE — Progress Notes (Signed)
Patient ID: Eduardo [name], male   DOB: 1945/06/11, 69 y.o.   MRN: [id number] TCTS DAILY ICU PROGRESS NOTE                   [address] [place]            [place] [address]          [phone]   3 Days Post-Op Procedure(s) (LRB): CORONARY ARTERY BYPASS GRAFTING (CABG)  x four,  using left internal mammary artery, and bilateral thigh greater saphenous veins (N/A) TRANSESOPHAGEAL ECHOCARDIOGRAM (TEE) (N/A)  Total Length of Stay:  LOS: 9 days   Subjective: Alert, neuro intact, afib started this am, bowels moved x2 last night  Objective: Vital signs in last 24 hours: Temp:  [97.5 F (36.4 C)-98.8 F (37.1 C)] 98.1 F (36.7 C) (12/08 0758) Pulse Rate:  [51-122] 103 (12/08 0800) Cardiac Rhythm:  [-] Atrial flutter;Atrial fibrillation (12/08 0800) Resp:  [12-27] 15 (12/08 0800) BP: (106-152)/(67-96) 114/79 mmHg (12/08 0800) SpO2:  [82 %-97 %] 93 % (12/08 0800) Weight:  [203 lb 0.7 oz (92.1 kg)] 203 lb 0.7 oz (92.1 kg) (12/08 0500)  Filed Weights   05/05/15 0500 05/06/15 0500 05/07/15 0500  Weight: 201 lb 3.2 oz (91.264 kg) 203 lb 7.8 oz (92.3 kg) 203 lb 0.7 oz (92.1 kg)    Weight change: -7.1 oz (-0.2 kg)   Hemodynamic parameters for last 24 hours:    Intake/Output from previous day: 12/07 0701 - 12/08 0700 In: 883.2 [P.O.:300; I.V.:583.2] Out: 1325 [Urine:1295; Chest Tube:30]  Intake/Output this shift: Total I/O In: 24.3 [I.V.:24.3] Out: 275 [Urine:275]  Current Meds: Scheduled Meds: . acetaminophen  1,000 mg Oral 4 times per day   Or  . acetaminophen (TYLENOL) oral liquid 160 mg/5 mL  1,000 mg Per Tube 4 times per day  . antiseptic oral rinse  7 mL Mouth Rinse BID  . aspirin EC  325 mg Oral Daily   Or  . aspirin  324 mg Per Tube Daily  . bisacodyl  10 mg Oral Daily   Or  . bisacodyl  10 mg Rectal Daily  . docusate sodium  200 mg Oral Daily  . enoxaparin (LOVENOX) injection  30 mg Subcutaneous Q24H  . ezetimibe  10 mg Oral Daily  .  fenofibrate  160 mg Oral Daily  . insulin aspart  0-24 Units Subcutaneous 6 times per day  . insulin detemir  10 Units Subcutaneous Daily  . metoCLOPramide (REGLAN) injection  10 mg Intravenous 4 times per day  . metoprolol tartrate  12.5 mg Oral BID   Or  . metoprolol tartrate  12.5 mg Per Tube BID  . pantoprazole  40 mg Oral Daily  . sertraline  100 mg Oral Daily  . sodium chloride  3 mL Intravenous Q12H  . tiotropium  18 mcg Inhalation Daily  . traMADol  50-100 mg Oral Q12H   Continuous Infusions: . sodium chloride Stopped (05/05/15 0900)  . sodium chloride    . sodium chloride 20 mL/hr (05/04/15 1500)  . sodium chloride 20 mL/hr at 05/07/15 0400  . DOPamine 2.5 mcg/kg/min (05/07/15 0400)  . lactated ringers Stopped (05/04/15 2030)  . lactated ringers Stopped (05/06/15 0800)   PRN Meds:.sodium chloride, lactated ringers, levalbuterol, metoprolol, morphine injection, ondansetron (ZOFRAN) IV, oxyCODONE, sodium chloride  General appearance: alert, cooperative and no distress Neurologic: intact Heart: irregularly irregular rhythm Lungs: diminished breath sounds bibasilar Abdomen: soft, non-tender; bowel sounds normal; no masses,  no organomegaly  and less distended then yesterday, bowel sounds present Extremities: extremities normal, atraumatic, no cyanosis or edema and Homans sign is negative, no sign of DVT Wound: small amt serous drainage upper incision, sternum stable  Lab Results: CBC: Recent Labs  05/06/15 1755 05/07/15 0400  WBC 11.4* 11.3*  HGB 10.7* 10.7*  HCT 32.1* 32.4*  PLT 140* 140*   BMET:  Recent Labs  05/06/15 1755 05/07/15 0400  NA 133* 134*  K 4.3 4.4  CL 101 100*  CO2 27 28  GLUCOSE 167* 160*  BUN 35* 35*  CREATININE 1.36* 1.05  CALCIUM 7.7* 7.9*    PT/INR:  Recent Labs  05/06/15 0445  LABPROT 16.2*  INR 1.28   Chronic Kidney Disease   Stage I     GFR >90  Stage II    GFR 60-89  Stage IIIA GFR 45-59  Stage IIIB GFR 30-44  Stage IV    GFR 15-29  Stage V    GFR  <15  Lab Results  Component Value Date   CREATININE 1.05 05/07/2015   Estimated Creatinine Clearance: 72.1 mL/min (by C-G formula based on Cr of 1.05).    Radiology: Dg Chest Port 1 View  05/07/2015  CLINICAL DATA:  69 year old male status post CABG. Initial encounter. EXAM: PORTABLE CHEST 1 VIEW COMPARISON:  05/06/2015 and earlier. FINDINGS: Portable AP upright view at 0653 hours. Left chest tube removed. Right IJ catheter/ introducer sheath remain. Epicardial pacer wires remain. No pneumothorax. Stable lung volumes. Mild left pleural effusion and atelectasis on the left. Stable minimal to mild right lung atelectasis. Stable cardiac size and mediastinal contours. No areas of worsening ventilation. IMPRESSION: 1. Left chest tube removed with no pneumothorax. 2. Mild atelectasis and left pleural effusion. Electronically Signed   By: Genevie Ann M.D.   On: 05/07/2015 07:48   Dg Abd Portable 1v  05/06/2015  CLINICAL DATA:  Vomiting. EXAM: PORTABLE ABDOMEN - 1 VIEW COMPARISON:  No prior. FINDINGS: Soft tissue structures are unremarkable. Air-filled loops of small and large bowel are noted. These are distended. Findings consist with adynamic ileus. Follow-up abdominal series suggested to demonstrate resolution. Prior lumbar spine fusion. No acute bony abnormality. IMPRESSION: Findings consistent with adynamic ileus. Follow-up of exams suggested to demonstrate clearing. Electronically Signed   By: Marcello Moores  Register   On: 05/06/2015 09:24     Assessment/Plan: S/P Procedure(s) (LRB): CORONARY ARTERY BYPASS GRAFTING (CABG)  x four,  using left internal mammary artery, and bilateral thigh greater saphenous veins (N/A) TRANSESOPHAGEAL ECHOCARDIOGRAM (TEE) (N/A) Mobilize Diuresis Diabetes control Continue foley due to strict I&O and urinary output monitoring Wean off dopamine Elevated cr yesterday has quickly returned to normal, with  such rapid change would not consider acute  kidney injury Mild post op ileus has resolved  Now afib start Cordarone     [name] 05/07/2015 8:15 AM

## 2015-05-07 NOTE — Progress Notes (Signed)
CT Surgery pm rounds  Stable a-fib 90-100 on iv amiodarone Resting comfortably O2 sat > 92%

## 2015-05-08 ENCOUNTER — Inpatient Hospital Stay (HOSPITAL_COMMUNITY): Payer: Medicare Other

## 2015-05-08 DIAGNOSIS — R7989 Other specified abnormal findings of blood chemistry: Secondary | ICD-10-CM

## 2015-05-08 LAB — CBC
HCT: 29.5 % — ABNORMAL LOW (ref 39.0–52.0)
Hemoglobin: 10 g/dL — ABNORMAL LOW (ref 13.0–17.0)
MCH: 31.9 pg (ref 26.0–34.0)
MCHC: 33.9 g/dL (ref 30.0–36.0)
MCV: 94.2 fL (ref 78.0–100.0)
Platelets: 185 10*3/uL (ref 150–400)
RBC: 3.13 MIL/uL — ABNORMAL LOW (ref 4.22–5.81)
RDW: 13.3 % (ref 11.5–15.5)
WBC: 7.9 10*3/uL (ref 4.0–10.5)

## 2015-05-08 LAB — GLUCOSE, CAPILLARY
GLUCOSE-CAPILLARY: 106 mg/dL — AB (ref 65–99)
GLUCOSE-CAPILLARY: 163 mg/dL — AB (ref 65–99)
Glucose-Capillary: 127 mg/dL — ABNORMAL HIGH (ref 65–99)
Glucose-Capillary: 122 mg/dL — ABNORMAL HIGH (ref 65–99)
Glucose-Capillary: 162 mg/dL — ABNORMAL HIGH (ref 65–99)

## 2015-05-08 LAB — BASIC METABOLIC PANEL
Anion gap: 7 (ref 5–15)
BUN: 30 mg/dL — ABNORMAL HIGH (ref 6–20)
CO2: 31 mmol/L (ref 22–32)
Calcium: 7.8 mg/dL — ABNORMAL LOW (ref 8.9–10.3)
Chloride: 97 mmol/L — ABNORMAL LOW (ref 101–111)
Creatinine, Ser: 0.81 mg/dL (ref 0.61–1.24)
GFR calc Af Amer: >60 mL/min (ref >60)
GFR calc non Af Amer: >60 mL/min (ref >60)
Glucose, Bld: 150 mg/dL — ABNORMAL HIGH (ref 65–99)
Potassium: 3.9 mmol/L (ref 3.5–5.1)
Sodium: 135 mmol/L (ref 135–145)

## 2015-05-08 LAB — HEPATIC FUNCTION PANEL
ALT: 22 U/L (ref 17–63)
AST: 20 U/L (ref 15–41)
Albumin: 2.6 g/dL — ABNORMAL LOW (ref 3.5–5.0)
Alkaline Phosphatase: 28 U/L — ABNORMAL LOW (ref 38–126)
Bilirubin, Direct: 0.2 mg/dL (ref 0.1–0.5)
Indirect Bilirubin: 0.3 mg/dL (ref 0.3–0.9)
Total Bilirubin: 0.5 mg/dL (ref 0.3–1.2)
Total Protein: 5.6 g/dL — ABNORMAL LOW (ref 6.5–8.1)

## 2015-05-08 LAB — TSH: TSH: 8.055 u[IU]/mL — ABNORMAL HIGH (ref 0.350–4.500)

## 2015-05-08 LAB — MAGNESIUM: Magnesium: 2.3 mg/dL (ref 1.7–2.4)

## 2015-05-08 MED ORDER — SODIUM CHLORIDE 0.9 % IJ SOLN: 3.0000 mL | INTRAMUSCULAR | Status: DC | PRN

## 2015-05-08 MED ORDER — MOVING RIGHT ALONG BOOK
Freq: Once | Status: AC
  Administered 2015-05-08: 08:00:00
  Filled 2015-05-08: qty 1

## 2015-05-08 MED ORDER — ASPIRIN EC 325 MG PO TBEC: 325.0000 mg | DELAYED_RELEASE_TABLET | Freq: Every day | ORAL | Status: DC

## 2015-05-08 MED ORDER — BISACODYL 5 MG PO TBEC
10.0000 mg | DELAYED_RELEASE_TABLET | Freq: Every day | ORAL | Status: DC | PRN
  Administered 2015-05-10: 10 mg via ORAL
  Filled 2015-05-08: qty 2

## 2015-05-08 MED ORDER — FLEET ENEMA 7-19 GM/118ML RE ENEM
1.0000 | ENEMA | Freq: Every day | RECTAL | Status: DC | PRN
  Filled 2015-05-08: qty 1

## 2015-05-08 MED ORDER — LEVALBUTEROL HCL 0.63 MG/3ML IN NEBU
0.6300 mg | INHALATION_SOLUTION | Freq: Four times a day (QID) | RESPIRATORY_TRACT | Status: DC
  Administered 2015-05-08 (×2): 0.63 mg via RESPIRATORY_TRACT
  Filled 2015-05-08 (×2): qty 3

## 2015-05-08 MED ORDER — SODIUM CHLORIDE 0.9 % IV SOLN: 250.0000 mL | INTRAVENOUS | Status: DC | PRN

## 2015-05-08 MED ORDER — FUROSEMIDE 40 MG PO TABS
40.0000 mg | ORAL_TABLET | Freq: Every day | ORAL | Status: DC
  Administered 2015-05-08 – 2015-05-10 (×3): 40 mg via ORAL
  Filled 2015-05-08 (×3): qty 1

## 2015-05-08 MED ORDER — CARVEDILOL 6.25 MG PO TABS
6.2500 mg | ORAL_TABLET | Freq: Two times a day (BID) | ORAL | Status: DC
  Administered 2015-05-08 – 2015-05-10 (×5): 6.25 mg via ORAL
  Filled 2015-05-08 (×5): qty 1

## 2015-05-08 MED ORDER — SODIUM CHLORIDE 0.9 % IJ SOLN
3.0000 mL | Freq: Two times a day (BID) | INTRAMUSCULAR | Status: DC
Start: 2015-05-08 — End: 2015-05-10
  Administered 2015-05-08 – 2015-05-10 (×4): 3 mL via INTRAVENOUS

## 2015-05-08 MED ORDER — INSULIN ASPART 100 UNIT/ML ~~LOC~~ SOLN
0.0000 [IU] | Freq: Three times a day (TID) | SUBCUTANEOUS | Status: DC
  Administered 2015-05-08 – 2015-05-09 (×3): 4 [IU] via SUBCUTANEOUS
  Administered 2015-05-09: 2 [IU] via SUBCUTANEOUS

## 2015-05-08 MED ORDER — TRAMADOL HCL 50 MG PO TABS
50.0000 mg | ORAL_TABLET | ORAL | Status: DC | PRN
  Administered 2015-05-08 – 2015-05-09 (×3): 100 mg via ORAL
  Filled 2015-05-08 (×3): qty 2

## 2015-05-08 MED ORDER — LACTULOSE 10 GM/15ML PO SOLN
20.0000 g | Freq: Every day | ORAL | Status: DC | PRN
  Filled 2015-05-08 (×2): qty 30

## 2015-05-08 MED ORDER — LEVOTHYROXINE SODIUM 25 MCG PO TABS
25.0000 ug | ORAL_TABLET | Freq: Every day | ORAL | Status: DC
  Administered 2015-05-08 – 2015-05-10 (×3): 25 ug via ORAL
  Filled 2015-05-08 (×3): qty 1

## 2015-05-08 MED ORDER — DOCUSATE SODIUM 100 MG PO CAPS
200.0000 mg | ORAL_CAPSULE | Freq: Every day | ORAL | Status: DC
  Administered 2015-05-08 – 2015-05-10 (×3): 200 mg via ORAL
  Filled 2015-05-08 (×3): qty 2

## 2015-05-08 MED ORDER — ASPIRIN EC 81 MG PO TBEC
81.0000 mg | DELAYED_RELEASE_TABLET | Freq: Every day | ORAL | Status: DC
  Administered 2015-05-08 – 2015-05-10 (×3): 81 mg via ORAL
  Filled 2015-05-08 (×3): qty 1

## 2015-05-08 MED ORDER — GUAIFENESIN ER 600 MG PO TB12: 600.0000 mg | ORAL_TABLET | Freq: Two times a day (BID) | ORAL | Status: DC | PRN

## 2015-05-08 MED ORDER — BISACODYL 10 MG RE SUPP
10.0000 mg | Freq: Every day | RECTAL | Status: DC | PRN
  Administered 2015-05-10: 10 mg via RECTAL
  Filled 2015-05-08 (×2): qty 1

## 2015-05-08 MED ORDER — OXYCODONE HCL 5 MG PO TABS: 5.0000 mg | ORAL_TABLET | ORAL | Status: DC | PRN

## 2015-05-08 MED ORDER — CLOPIDOGREL BISULFATE 75 MG PO TABS
75.0000 mg | ORAL_TABLET | Freq: Every day | ORAL | Status: DC
  Administered 2015-05-08 – 2015-05-10 (×3): 75 mg via ORAL
  Filled 2015-05-08 (×3): qty 1

## 2015-05-08 MED ORDER — PANTOPRAZOLE SODIUM 40 MG PO TBEC
40.0000 mg | DELAYED_RELEASE_TABLET | Freq: Every day | ORAL | Status: DC
  Administered 2015-05-08 – 2015-05-10 (×3): 40 mg via ORAL
  Filled 2015-05-08 (×3): qty 1

## 2015-05-08 NOTE — Progress Notes (Addendum)
Attempt to call report to RN on [facility], unable at time, a/w callback. Sherrie Mustache 2:15 PM   Report called to Lesly Rubenstein RN   Transferred to [facility] per Altha Harm RN.

## 2015-05-08 NOTE — Plan of Care (Signed)
Problem: Nutrition: Goal: Adequate nutrition will be maintained Outcome: Progressing Pt educated on importance of protein in the healing process

## 2015-05-08 NOTE — Progress Notes (Addendum)
TCTS DAILY ICU PROGRESS NOTE                   [address]            [place] [address]          [phone]   4 Days Post-Op Procedure(s) (LRB): CORONARY ARTERY BYPASS GRAFTING (CABG)  x four,  using left internal mammary artery, and bilateral thigh greater saphenous veins (N/A) TRANSESOPHAGEAL ECHOCARDIOGRAM (TEE) (N/A)  Total Length of Stay:  LOS: 10 days   Subjective:  No new complaints.  + ambulation  Objective: Vital signs in last 24 hours: Temp:  [97.8 F (36.6 C)-98.8 F (37.1 C)] 97.8 F (36.6 C) (12/09 0354) Pulse Rate:  [65-116] 65 (12/09 0700) Cardiac Rhythm:  [-] Normal sinus rhythm (12/09 0400) Resp:  [12-23] 12 (12/09 0700) BP: (97-136)/(63-86) 132/79 mmHg (12/09 0700) SpO2:  [88 %-99 %] 96 % (12/09 0700) Weight:  [200 lb 9.9 oz (91 kg)] 200 lb 9.9 oz (91 kg) (12/09 0300)  Filed Weights   05/06/15 0500 05/07/15 0500 05/08/15 0300  Weight: 203 lb 7.8 oz (92.3 kg) 203 lb 0.7 oz (92.1 kg) 200 lb 9.9 oz (91 kg)    Weight change: -2 lb 6.8 oz (-1.1 kg)     Intake/Output from previous day: 12/08 0701 - 12/09 0700 In: 1085.6 [P.O.:240; I.V.:845.6] Out: 2235 [Urine:2235]  Current Meds: Scheduled Meds: . acetaminophen  1,000 mg Oral 4 times per day   Or  . acetaminophen (TYLENOL) oral liquid 160 mg/5 mL  1,000 mg Per Tube 4 times per day  . amiodarone  200 mg Oral Q12H   Followed by  . [START ON 05/15/2015] amiodarone  200 mg Oral Daily  . antiseptic oral rinse  7 mL Mouth Rinse BID  . aspirin EC  325 mg Oral Daily   Or  . aspirin  324 mg Per Tube Daily  . bisacodyl  10 mg Oral Daily   Or  . bisacodyl  10 mg Rectal Daily  . docusate sodium  200 mg Oral Daily  . enoxaparin (LOVENOX) injection  30 mg Subcutaneous Q24H  . ezetimibe  10 mg Oral Daily  . fenofibrate  160 mg Oral Daily  . insulin aspart  0-24 Units Subcutaneous 6 times per day  . insulin detemir  10 Units Subcutaneous Daily  . levothyroxine  25 mcg Oral QAC breakfast   . metoCLOPramide (REGLAN) injection  10 mg Intravenous 4 times per day  . metoprolol tartrate  12.5 mg Oral BID   Or  . metoprolol tartrate  12.5 mg Per Tube BID  . pantoprazole  40 mg Oral Daily  . sertraline  100 mg Oral Daily  . sodium chloride  3 mL Intravenous Q12H  . tiotropium  18 mcg Inhalation Daily  . traMADol  50-100 mg Oral Q12H   Continuous Infusions: . sodium chloride    . sodium chloride 20 mL/hr at 05/07/15 2000  . amiodarone Stopped (05/07/15 2320)   PRN Meds:.levalbuterol, metoprolol, ondansetron (ZOFRAN) IV, oxyCODONE, sodium chloride  General appearance: alert, cooperative and no distress Heart: regular rate and rhythm Lungs: clear to auscultation bilaterally Abdomen: soft, non-tender; bowel sounds normal; no masses,  no organomegaly Extremities: edema trace Wound: clean and dry  Lab Results: CBC: Recent Labs  05/07/15 0400 05/08/15 0420  WBC 11.3* 7.9  HGB 10.7* 10.0*  HCT 32.4* 29.5*  PLT 140* 185   BMET:  Recent Labs  05/07/15 0400 05/08/15  0420  NA 134* 135  K 4.4 3.9  CL 100* 97*  CO2 28 31  GLUCOSE 160* 150*  BUN 35* 30*  CREATININE 1.05 0.81  CALCIUM 7.9* 7.8*    PT/INR:  Recent Labs  05/06/15 0445  LABPROT 16.2*  INR 1.28   Radiology: No results found.   Assessment/Plan: S/P Procedure(s) (LRB): CORONARY ARTERY BYPASS GRAFTING (CABG)  x four,  using left internal mammary artery, and bilateral thigh greater saphenous veins (N/A) TRANSESOPHAGEAL ECHOCARDIOGRAM (TEE) (N/A)  1. CV- previous Atrial Fibrillation, currently NSR- continue Amiodarone, Lopressor 2. Pulm- wean oxygen as tolerated, continue IS, CXR + atelectasis 3. Renal- creatinine WNL, + hypervolemia, will order PO Lasix 4. ?Hypothyroid- TSH is elevated at 8.055, will start low dose Synthroid- will need close follow up with PCP 5. GI- Ileus improving, has yet to move his bowels- will order enema, lactulose prn 6. Dispo- patient is maintaining NSR, needs to move  bowels, new diagnosis of Hypothyroid, will start low dose synthroid, transfer to circle     [name], Eduardo 05/08/2015 7:49 AM  elevated tsh added to problem list Holding sinus To step down today I have seen and examined Eduardo [name] and agree with the above assessment  and plan.  [name] MD Beeper [phone] Office [phone] 05/08/2015 11:22 AM

## 2015-05-09 ENCOUNTER — Inpatient Hospital Stay (HOSPITAL_COMMUNITY): Payer: Medicare Other

## 2015-05-09 LAB — CBC
HCT: 32.5 % — ABNORMAL LOW (ref 39.0–52.0)
Hemoglobin: 10.9 g/dL — ABNORMAL LOW (ref 13.0–17.0)
MCH: 31.9 pg (ref 26.0–34.0)
MCHC: 33.5 g/dL (ref 30.0–36.0)
MCV: 95 fL (ref 78.0–100.0)
Platelets: 196 10*3/uL (ref 150–400)
RBC: 3.42 MIL/uL — ABNORMAL LOW (ref 4.22–5.81)
RDW: 13.4 % (ref 11.5–15.5)
WBC: 7.9 10*3/uL (ref 4.0–10.5)

## 2015-05-09 LAB — BASIC METABOLIC PANEL
Anion gap: 10 (ref 5–15)
BUN: 26 mg/dL — ABNORMAL HIGH (ref 6–20)
CO2: 30 mmol/L (ref 22–32)
Calcium: 8.3 mg/dL — ABNORMAL LOW (ref 8.9–10.3)
Chloride: 98 mmol/L — ABNORMAL LOW (ref 101–111)
Creatinine, Ser: 0.79 mg/dL (ref 0.61–1.24)
GFR calc Af Amer: >60 mL/min (ref >60)
GFR calc non Af Amer: >60 mL/min (ref >60)
Glucose, Bld: 88 mg/dL (ref 65–99)
Potassium: 4.2 mmol/L (ref 3.5–5.1)
Sodium: 138 mmol/L (ref 135–145)

## 2015-05-09 LAB — GLUCOSE, CAPILLARY
GLUCOSE-CAPILLARY: 125 mg/dL — AB (ref 65–99)
GLUCOSE-CAPILLARY: 105 mg/dL — AB (ref 65–99)
Glucose-Capillary: 164 mg/dL — ABNORMAL HIGH (ref 65–99)

## 2015-05-09 MED ORDER — LEVALBUTEROL HCL 0.63 MG/3ML IN NEBU: 0.6300 mg | INHALATION_SOLUTION | Freq: Four times a day (QID) | RESPIRATORY_TRACT | Status: DC | PRN

## 2015-05-09 MED ORDER — AMLODIPINE BESYLATE 5 MG PO TABS
5.0000 mg | ORAL_TABLET | Freq: Every day | ORAL | Status: DC
  Administered 2015-05-09 – 2015-05-10 (×2): 5 mg via ORAL
  Filled 2015-05-09 (×2): qty 1

## 2015-05-09 MED ORDER — LEVALBUTEROL HCL 0.63 MG/3ML IN NEBU
0.6300 mg | INHALATION_SOLUTION | Freq: Three times a day (TID) | RESPIRATORY_TRACT | Status: DC
  Administered 2015-05-09: 0.63 mg via RESPIRATORY_TRACT
  Filled 2015-05-09 (×2): qty 3

## 2015-05-09 MED ORDER — METFORMIN HCL 500 MG PO TABS
1000.0000 mg | ORAL_TABLET | Freq: Two times a day (BID) | ORAL | Status: DC
  Administered 2015-05-09 – 2015-05-10 (×2): 1000 mg via ORAL
  Filled 2015-05-09 (×2): qty 2

## 2015-05-09 MED ORDER — AMLODIPINE BESY-BENAZEPRIL HCL 5-20 MG PO CAPS: 1.0000 | ORAL_CAPSULE | Freq: Every day | ORAL | Status: DC

## 2015-05-09 MED ORDER — BENAZEPRIL HCL 20 MG PO TABS
20.0000 mg | ORAL_TABLET | Freq: Every day | ORAL | Status: DC
  Administered 2015-05-09 – 2015-05-10 (×2): 20 mg via ORAL
  Filled 2015-05-09 (×2): qty 1

## 2015-05-09 NOTE — Progress Notes (Addendum)
Eduardo [name]             [phone]      5 Days Post-Op Procedure(s) (LRB): CORONARY ARTERY BYPASS GRAFTING (CABG)  x four,  using left internal mammary artery, and bilateral thigh greater saphenous veins (N/A) TRANSESOPHAGEAL ECHOCARDIOGRAM (TEE) (N/A) Subjective: Feels pretty well   Objective: Vital signs in last 24 hours: Temp:  [97.5 F (36.4 C)-98.5 F (36.9 C)] 98.3 F (36.8 C) (12/10 0624) Pulse Rate:  [49-74] 73 (12/09 2004) Cardiac Rhythm:  [-] Normal sinus rhythm (12/09 2353) Resp:  [14-24] 18 (12/09 2031) BP: (117-156)/(72-80) 140/77 mmHg (12/10 0800) SpO2:  [86 %-98 %] 93 % (12/10 0624) Weight:  [190 lb 14.7 oz (86.6 kg)] 190 lb 14.7 oz (86.6 kg) (12/10 0145)  Hemodynamic parameters for last 24 hours:    Intake/Output from previous day: 12/09 0701 - 12/10 0700 In: 270 [P.O.:240; I.V.:30] Out: 950 [Urine:950] Intake/Output this shift: Total I/O In: 360 [P.O.:360] Out: -   General appearance: alert, cooperative and no distress Heart: regular rate and rhythm Lungs: clear to auscultation bilaterally Abdomen: benign Extremities: mild edema Wound: incis healing well  Lab Results:  Recent Labs  05/08/15 0420 05/09/15 0250  WBC 7.9 7.9  HGB 10.0* 10.9*  HCT 29.5* 32.5*  PLT 185 196   BMET:  Recent Labs  05/08/15 0420 05/09/15 0250  NA 135 138  K 3.9 4.2  CL 97* 98*  CO2 31 30  GLUCOSE 150* 88  BUN 30* 26*  CREATININE 0.81 0.79  CALCIUM 7.8* 8.3*    PT/INR: No results for input(s): LABPROT, INR in the last 72 hours. ABG    Component Value Date/Time   PHART 7.369 05/05/2015 0058   HCO3 27.3* 05/05/2015 0058   TCO2 24 05/05/2015 1622   ACIDBASEDEF 1.0 05/04/2015 2333   O2SAT 93.0 05/05/2015 0058   CBG (last 3)   Recent Labs  05/08/15 1712 05/08/15 2137 05/09/15 0730  GLUCAP 163* 162* 125*    Meds Scheduled Meds: . amiodarone  200 mg Oral Q12H   Followed by  . [START ON  05/15/2015] amiodarone  200 mg Oral Daily  . antiseptic oral rinse  7 mL Mouth Rinse BID  . aspirin EC  81 mg Oral Daily  . carvedilol  6.25 mg Oral BID WC  . clopidogrel  75 mg Oral Daily  . docusate sodium  200 mg Oral Daily  . enoxaparin (LOVENOX) injection  30 mg Subcutaneous Q24H  . ezetimibe  10 mg Oral Daily  . fenofibrate  160 mg Oral Daily  . furosemide  40 mg Oral Daily  . insulin aspart  0-24 Units Subcutaneous TID AC & HS  . levalbuterol  0.63 mg Nebulization TID  . levothyroxine  25 mcg Oral QAC breakfast  . pantoprazole  40 mg Oral QAC breakfast  . sertraline  100 mg Oral Daily  . sodium chloride  3 mL Intravenous Q12H  . tiotropium  18 mcg Inhalation Daily   Continuous Infusions:  PRN Meds:.sodium chloride, bisacodyl **OR** bisacodyl, guaiFENesin, lactulose, levalbuterol, oxyCODONE, sodium chloride, traMADol  Xrays Dg Chest 2 View  05/09/2015  CLINICAL DATA:  Coronary bypass, COPD, hypertension and diabetes. Postop exam. EXAM: CHEST  2 VIEW COMPARISON:  05/08/2015 FINDINGS: Coronary bypass changes noted. Right IJ central lines have been removed. Low lung volumes persist with residual bibasilar atelectasis and trace pleural effusions. Apical subpleural blebs suspected bilaterally.  No significant interval change. No pneumothorax evident. IMPRESSION: Persistent low lung volumes with basilar atelectasis and trace pleural effusions. Electronically Signed   By: Jerilynn Mages.  Shick M.D.   On: 05/09/2015 09:04   Dg Chest Port 1 View  05/08/2015  CLINICAL DATA:  Chest tube EXAM: PORTABLE CHEST 1 VIEW COMPARISON:  05/07/2015 FINDINGS: No chest tube identified. Right jugular central venous catheter tip in the SVC. Right jugular sheath in the right innominate vein. These are unchanged Slight increase in bibasilar atelectasis left greater than right. Small left effusion. Negative for edema. Negative for pneumothorax IMPRESSION: Progression of bibasilar atelectasis left greater than right. No  pneumothorax. Electronically Signed   By: Franchot Gallo M.D.   On: 05/08/2015 08:10    Assessment/Plan: S/P Procedure(s) (LRB): CORONARY ARTERY BYPASS GRAFTING (CABG)  x four,  using left internal mammary artery, and bilateral thigh greater saphenous veins (N/A) TRANSESOPHAGEAL ECHOCARDIOGRAM (TEE) (N/A)  1 making steady progress 2 having BM's and flatus 3 creat is normal 4 cont gentle diuresis 5 HTN restart lotrel 6 sugars mildly elevated resart glucophage  LOS: 11 days    [name],[name] 05/09/2015  Restart glucophage and ace Plan d/c in am I have seen and examined Eduardo [name] and agree with the above assessment  and plan.  [name] MD Beeper [phone] Office [phone] 05/09/2015 11:47 AM

## 2015-05-09 NOTE — Progress Notes (Signed)
Patient is ambulating independently x 2 today already.  Progressing well, looks good.  Discharge instructions completed with patient and wife.  Referred to phase II cardiac rehab @ [facility]. [id number]

## 2015-05-10 LAB — BASIC METABOLIC PANEL
Anion gap: 8 (ref 5–15)
BUN: 20 mg/dL (ref 6–20)
CO2: 31 mmol/L (ref 22–32)
Calcium: 8.7 mg/dL — ABNORMAL LOW (ref 8.9–10.3)
Chloride: 100 mmol/L — ABNORMAL LOW (ref 101–111)
Creatinine, Ser: 0.84 mg/dL (ref 0.61–1.24)
GFR calc Af Amer: >60 mL/min (ref >60)
GFR calc non Af Amer: >60 mL/min (ref >60)
Glucose, Bld: 125 mg/dL — ABNORMAL HIGH (ref 65–99)
Potassium: 3.9 mmol/L (ref 3.5–5.1)
Sodium: 139 mmol/L (ref 135–145)

## 2015-05-10 LAB — GLUCOSE, CAPILLARY
Glucose-Capillary: 107 mg/dL — ABNORMAL HIGH (ref 65–99)
Glucose-Capillary: 155 mg/dL — ABNORMAL HIGH (ref 65–99)

## 2015-05-10 MED ORDER — FUROSEMIDE 40 MG PO TABS: 40.0000 mg | ORAL_TABLET | Freq: Every day | ORAL | Status: DC

## 2015-05-10 MED ORDER — TRAMADOL HCL 50 MG PO TABS: 50.0000 mg | ORAL_TABLET | ORAL | Status: DC | PRN

## 2015-05-10 MED ORDER — POTASSIUM CHLORIDE CRYS ER 20 MEQ PO TBCR: 20.0000 meq | EXTENDED_RELEASE_TABLET | Freq: Every day | ORAL | Status: DC

## 2015-05-10 MED ORDER — LEVOTHYROXINE SODIUM 25 MCG PO TABS: 25.0000 ug | ORAL_TABLET | Freq: Every day | ORAL | Status: DC

## 2015-05-10 MED ORDER — AMIODARONE HCL 200 MG PO TABS: 200.0000 mg | ORAL_TABLET | Freq: Two times a day (BID) | ORAL | Status: DC

## 2015-05-10 MED ORDER — ASPIRIN 81 MG PO TBEC: 81.0000 mg | DELAYED_RELEASE_TABLET | Freq: Every day | ORAL | Status: AC

## 2015-05-10 MED ORDER — POTASSIUM CHLORIDE CRYS ER 20 MEQ PO TBCR
20.0000 meq | EXTENDED_RELEASE_TABLET | Freq: Every day | ORAL | Status: DC
  Administered 2015-05-10: 20 meq via ORAL

## 2015-05-10 NOTE — Progress Notes (Addendum)
[address]       [place] [address]             [phone]      6 Days Post-Op Procedure(s) (LRB): CORONARY ARTERY BYPASS GRAFTING (CABG)  x four,  using left internal mammary artery, and bilateral thigh greater saphenous veins (N/A) TRANSESOPHAGEAL ECHOCARDIOGRAM (TEE) (N/A) Subjective: Feels ok, wants to go home  Objective: Vital signs in last 24 hours: Temp:  [98.6 F (37 C)-99 F (37.2 C)] 99 F (37.2 C) (12/11 0323) Pulse Rate:  [70-72] 70 (12/11 0323) Cardiac Rhythm:  [-] Normal sinus rhythm (12/10 2019) Resp:  [16-18] 18 (12/11 0323) BP: (104-135)/(57-76) 135/76 mmHg (12/11 0800) SpO2:  [94 %-96 %] 94 % (12/11 0323) Weight:  [195 lb 3.2 oz (88.542 kg)] 195 lb 3.2 oz (88.542 kg) (12/11 0323)  Hemodynamic parameters for last 24 hours:    Intake/Output from previous day: 12/10 0701 - 12/11 0700 In: 600 [P.O.:600] Out: 100 [Urine:100] Intake/Output this shift: Total I/O In: 240 [P.O.:240] Out: -   General appearance: alert, cooperative and no distress Heart: regular rate and rhythm Lungs: clear to auscultation bilaterally Abdomen: benign Extremities: + LE edema Wound: incis healing well  Lab Results:  Recent Labs  05/08/15 0420 05/09/15 0250  WBC 7.9 7.9  HGB 10.0* 10.9*  HCT 29.5* 32.5*  PLT 185 196   BMET:  Recent Labs  05/09/15 0250 05/10/15 0234  NA 138 139  K 4.2 3.9  CL 98* 100*  CO2 30 31  GLUCOSE 88 125*  BUN 26* 20  CREATININE 0.79 0.84  CALCIUM 8.3* 8.7*    PT/INR: No results for input(s): LABPROT, INR in the last 72 hours. ABG    Component Value Date/Time   PHART 7.369 05/05/2015 0058   HCO3 27.3* 05/05/2015 0058   TCO2 24 05/05/2015 1622   ACIDBASEDEF 1.0 05/04/2015 2333   O2SAT 93.0 05/05/2015 0058   CBG (last 3)   Recent Labs  05/09/15 1633 05/09/15 2121 05/10/15 0624  GLUCAP 105* 164* 107*    Meds Scheduled Meds: . amiodarone  200 mg Oral Q12H   Followed by  . [START ON  05/15/2015] amiodarone  200 mg Oral Daily  . amLODipine  5 mg Oral Daily   And  . benazepril  20 mg Oral Daily  . antiseptic oral rinse  7 mL Mouth Rinse BID  . aspirin EC  81 mg Oral Daily  . carvedilol  6.25 mg Oral BID WC  . clopidogrel  75 mg Oral Daily  . docusate sodium  200 mg Oral Daily  . enoxaparin (LOVENOX) injection  30 mg Subcutaneous Q24H  . ezetimibe  10 mg Oral Daily  . fenofibrate  160 mg Oral Daily  . furosemide  40 mg Oral Daily  . insulin aspart  0-24 Units Subcutaneous TID AC & HS  . levalbuterol  0.63 mg Nebulization TID  . levothyroxine  25 mcg Oral QAC breakfast  . metFORMIN  1,000 mg Oral BID WC  . pantoprazole  40 mg Oral QAC breakfast  . sertraline  100 mg Oral Daily  . sodium chloride  3 mL Intravenous Q12H  . tiotropium  18 mcg Inhalation Daily   Continuous Infusions:  PRN Meds:.sodium chloride, bisacodyl **OR** bisacodyl, guaiFENesin, lactulose, levalbuterol, oxyCODONE, sodium chloride, traMADol  Xrays Dg Chest 2 View  05/09/2015  CLINICAL DATA:  Coronary bypass, COPD, hypertension and diabetes. Postop exam. EXAM: CHEST  2 VIEW COMPARISON:  05/08/2015 FINDINGS: Coronary bypass changes noted. Right IJ central lines have been removed. Low lung volumes persist with residual bibasilar atelectasis and trace pleural effusions. Apical subpleural blebs suspected bilaterally. No significant interval change. No pneumothorax evident. IMPRESSION: Persistent low lung volumes with basilar atelectasis and trace pleural effusions. Electronically Signed   By: Jerilynn Mages.  Shick M.D.   On: 05/09/2015 09:04    Assessment/Plan: S/P Procedure(s) (LRB): CORONARY ARTERY BYPASS GRAFTING (CABG)  x four,  using left internal mammary artery, and bilateral thigh greater saphenous veins (N/A) TRANSESOPHAGEAL ECHOCARDIOGRAM (TEE) (N/A)  1 doing well , stable for D/C 2 d/c wires 3 will need some diuresis at home short term as well  LOS: 12 days    GOLD,WAYNE E 05/10/2015  Holding  sinus  Wounds all intact Plan home today I have seen and examined Rodena Goldmann and agree with the above assessment  and plan.  [name] MD Beeper [phone] Office [phone] 05/10/2015 9:49 AM

## 2015-05-10 NOTE — Discharge Summary (Signed)
Physician Discharge Summary  Patient ID: Eduardo [name] MRN: [id number] DOB/AGE: 1946-01-28 69 y.o.  Admit date: 04/28/2015 Discharge date: 05/10/2015  Admission Diagnoses: CAD                                         Unstable angina Discharge Diagnoses:  Active Problems:   CAD (coronary artery disease)   HTN (hypertension)   Hyperlipidemia   Type 2 diabetes mellitus without complication, without long-term current use of insulin (HCC)   COPD (chronic obstructive pulmonary disease) (HCC)   PTSD (post-traumatic stress disorder)   Unstable angina (HCC)   Coronary artery disease involving native coronary artery of native heart with unstable angina pectoris (HCC)   S/P CABG x 4   Elevated TSH  Patient Active Problem List   Diagnosis Date Noted  . Elevated TSH 05/08/2015  . S/P CABG x 4   . Coronary artery disease involving native coronary artery of native heart with unstable angina pectoris ([place])   . Bilateral carotid artery disease ([place]) 04/28/2015  . Obesity (BMI 30-39.9)   . Unstable angina ([place]) 03/18/2014  . Type 2 diabetes mellitus without complication, without long-term current use of insulin ([place]) 09/21/2013  . COPD (chronic obstructive pulmonary disease) ([place]) 09/21/2013  . PTSD (post-traumatic stress disorder) 09/21/2013  . CAD (coronary artery disease) 04/16/2013  . HTN (hypertension) 04/16/2013  . Hyperlipidemia 04/16/2013   Pt. Profile: At time of admission  Eduardo [name] is a 69 y.o. male with a history of CAD s/p multiple PTCAs in the past, thoracic aortic aneurysm, COPD, DM, and OSA who was transferred to [facility] from [facility] today for chest pain concerning for unstable angina.   Per patient he has had multiple PCTAs but no stents. He does not know how many. They were done in [place]. He was hospitalized at [facility] in October 2015 for chest pain and underwent coronary angiography on 03/18/14 by Dr. Martinique. This demonstrated complex calcified  disease in the RCA with a moderate stenosis at the origin of OM 2 and borderline disease in the mid LAD. LV gram showed EF 55-65%, with no significant mitral regurgitation. Chest pain was felt to be somewhat atypical. It appeared that he likely underwent PTCA of the OM 2 in the past. The RCA had heavy calcification, tortuosity, and eccentric plaque. It was felt that PCI of the RCA would be complex due to this marked tortuosity and calcification, for which reason he underwent a functional assessment of his ischemic burden with a nuclear stress test. Nuclear stress testing demonstrated a moderate sized, small intensity defect involving the mid and basal inferior and inferoseptal walls. There was a small amount of reversibility in the mid and basal inferolateral wall which could represent ischemia. However, there was evidence of soft tissue attenuation as well as diaphragmatic attenuation that could also have accounted for variations in radiotracer uptake. It was felt that it would be best to treat him medically. Of note, he is entirely intolerant to statin therapy due to severe myalgia.  Last seen by Dr. Bronson Ing in 10/2014 and felt to be stable from a cardiac standpoint. He was continued on ASA and plavix. He has a history of thoracic aortic aneurysm followed at the [place]. He saw his cardiothoracic surgeon about 3 weeks ago who did what sounds like a CT scan to monitor aorta. Per patient, the doctor told him he had extensive coronary  calcification in his coronary arteries that would require bypass surgery. He had him set up to see a cardiologist and planned for [facility]. He was supposed to go to see the cardiologist this Friday. He has noticed intermittent chest pain for the past couple weeks but was trying to hold out until Friday. However, yesterday he felt like "he over did it" shoveling sand and developed chest pain. The patient this morning went to outpatient rehabilitation for bilateral shoulder pain that he has  been having. Following rehabilitation he began to have intermittent chest discomfort that occurred on several occasions. This morning he walked outside and developed acute chest tightness, nausea, diaphoresis and SOB, which is unusual for him and presented to [facility]. He was given SL NTG which helped his chest pain but gave him a headache and shooting pain down his left neck and shoulder. He currently has 2/10 chest tightness. He was started on IV heparin and transferred to [facility] for unstable angina.   LABS AT [facility]: troponin T normal. Creat 0.68 H/H: 14.2/40.8. Everything else WNL   Discharged Condition: good  Hospital Course: The patient was admitted by the cardiology service for further evaluation and management to include cardiac catheterization. This was performed on 04/29/2015 with the following results noted:  Coronary Findings    Dominance: Right   Left Anterior Descending   . Mid LAD lesion, 80% stenosed.   . Second Diagonal Branch   . Ost 2nd Diag to 2nd Diag lesion, 60% stenosed.     Left Circumflex   . Mid Cx lesion, 75% stenosed.     Right Coronary Artery  . Vessel is large. There is mild the vessel.   . Prox RCA lesion, 50% stenosed. Calcified.   . Mid RCA lesion, 70% stenosed. Calcified.      Wall Motion                 Left Heart    Left Ventricle The left ventricular size is normal. The left ventricular systolic function is normal. The left ventricular ejection fraction is 55-65% by visual estimate. There are no wall motion abnormalities in the left ventricle.   Aortic Valve There is no aortic valve stenosis.    Coronary Diagrams    Diagnostic Diagram            Implants    He was referred for surgical revascularization and cardiothoracic consultation was obtained with Lanelle Bal M.D. who evaluated the patient and studies and agreed with recommendations to proceed with surgery after a Plavix washout. He remained medically  stable and on 05/04/2015 he was taken the operating room at which time he underwent the following procedure: DATE OF PROCEDURE: 05/04/2015 DATE OF DISCHARGE:   OPERATIVE REPORT   PREOPERATIVE DIAGNOSIS: Unstable angina with 3-vessel coronary artery disease.  POSTOPERATIVE DIAGNOSIS: Unstable angina with 3-vessel coronary artery disease.  SURGICAL PROCEDURE: Coronary artery bypass grafting x4 with the left internal mammary to the left anterior descending coronary artery, reverse saphenous vein graft to the 2nd diagonal coronary artery, reverse saphenous vein graft to the first obtuse marginal, reverse saphenous vein graft to the posterior descending coronary artery with bilateral greater saphenous thigh endoscopic vein harvesting.  SURGEON: Lanelle Bal, MD  FIRST ASSISTANT: Lars Pinks, PA He tolerated the procedure well and was taken to the surgical intensive care unit in stable condition.  POSTOPERATIVE HOSPITAL COURSE: Overall the patient has done well postoperatively. He has maintained stable hemodynamics although he did have atrial fibrillation. He is subsequently  been chemically cardioverted. His TSH is noted to be elevated at 8.055 and he has been started on Synthroid supplement. He did have a postoperative ileus which has resolved. All routine lines, monitors and drainage devices have been discontinued in the standard fashion. He has an expected acute blood loss anemia which is stable. He did have a slight bump in his creatinine initially postoperatively but has returned to the normal range. He has had some hypertension and been restarted on his ARB. Oxygen has been weaned and he maintains good saturations on room air. Blood glucose have been under adequate control using standard measures with transition to his oral Glucophage home dosing. Incisions are healing well without evidence of infection. He is tolerating gradually  increasing activities using postoperative routine cardiac rehabilitation protocols. At time of discharge she was felt to be quite stable.               Consults: cardiology  Significant Diagnostic Studies: angiography: Cardiac catheterization and CT scan of the chest without contrast. Routine postoperative serial laboratory and chest x-ray.   Treatments: surgery: As described above   Discharge Exam: Blood pressure 135/76, pulse 70, temperature 99 F (37.2 C), temperature source Oral, resp. rate 18, height 5\' 8"  (1.727 m), weight 195 lb 3.2 oz (88.542 kg), SpO2 94 %.     General appearance: alert, cooperative and no distress Heart: regular rate and rhythm Lungs: clear to auscultation bilaterally Abdomen: benign Extremities: mild edema Wound: incis healing well   Disposition: 01-Home or Self Care      Discharge Instructions    Amb Referral to Cardiac Rehabilitation    Complete by:  As directed   Diagnosis:  CABG            Medication List    STOP taking these medications        meloxicam 15 MG tablet  Commonly known as:  MOBIC     nitroGLYCERIN 0.4 MG SL tablet  Commonly known as:  NITROSTAT      TAKE these medications        albuterol 108 (90 BASE) MCG/ACT inhaler  Commonly known as:  PROVENTIL HFA;VENTOLIN HFA  Inhale 1 puff into the lungs every 6 (six) hours as needed for wheezing or shortness of breath.     amiodarone 200 MG tablet  Commonly known as:  PACERONE  Take 1 tablet (200 mg total) by mouth every 12 (twelve) hours. For 7 days then once daily     amLODipine-benazepril 5-20 MG capsule  Commonly known as:  LOTREL  Take 1 capsule by mouth daily.     aspirin 81 MG EC tablet  Take 1 tablet (81 mg total) by mouth daily.     carvedilol 6.25 MG tablet  Commonly known as:  COREG  Take 1 tablet (6.25 mg total) by mouth 2 (two) times daily with a meal.     clopidogrel 75 MG tablet  Commonly known as:  PLAVIX  TAKE 1 TABLET DAILY     ezetimibe 10 MG  tablet  Commonly known as:  ZETIA  Take 10 mg by mouth daily.     fenofibrate micronized 134 MG capsule  Commonly known as:  LOFIBRA  TAKE 1 CAPSULE DAILY BEFORE BREAKFAST     furosemide 40 MG tablet  Commonly known as:  LASIX  Take 1 tablet (40 mg total) by mouth daily.     levothyroxine 25 MCG tablet  Commonly known as:  SYNTHROID, LEVOTHROID  Take 1 tablet (25  mcg total) by mouth daily before breakfast.     metFORMIN 1000 MG tablet  Commonly known as:  GLUCOPHAGE  Take 1,000 mg by mouth 2 (two) times daily with a meal.     potassium chloride SA 20 MEQ tablet  Commonly known as:  K-DUR,KLOR-CON  Take 1 tablet (20 mEq total) by mouth daily.     sertraline 100 MG tablet  Commonly known as:  ZOLOFT  Take 100 mg by mouth daily.     tiotropium 18 MCG inhalation capsule  Commonly known as:  SPIRIVA  Place 18 mcg into inhaler and inhale daily.     traMADol 50 MG tablet  Commonly known as:  ULTRAM  Take 1-2 tablets (50-100 mg total) by mouth every 4 (four) hours as needed for moderate pain.       Follow-up Information    Follow up with [name], MD.   Specialty:  Cardiothoracic Surgery   Why:  office will contact with 4 week appt   Contact information:   [place] [address] [phone]       Follow up with Peter Martinique, MD.   Specialty:  Cardiology   Why:  office will contact with 2 week appt   Contact information:   [place] [address] [phone]      The patient has been discharged on:   1.Beta Blocker:  Yes [  y ]                              No   [   ]                              If No, reason:  2.Ace Inhibitor/ARB: Yes [ y  ]                                     No  [    ]                                     If No, reason:  3.Statin:   Yes [  y ]                  No  [   ]                  If No, reason:  4.Ecasa:  Yes  Blue.[place]   ]                  No   [   ]                  If  No, reason: Signed: Sparkles Mcneely E 05/10/2015, 9:22 AM

## 2015-05-10 NOTE — Discharge Instructions (Signed)
Endoscopic Saphenous Vein Harvesting, Care After °Refer to this sheet in the next few weeks. These instructions provide you with information on caring for yourself after your procedure. Your health care provider may also give you more specific instructions. Your treatment has been planned according to current medical practices, but problems sometimes occur. Call your health care provider if you have any problems or questions after your procedure. °HOME CARE INSTRUCTIONS °Medicine °· Take whatever pain medicine your surgeon prescribes. Follow the directions carefully. Do not take over-the-counter pain medicine unless your surgeon says it is okay. Some pain medicine can cause bleeding problems for several weeks after surgery. °· Follow your surgeon's instructions about driving. You will probably not be permitted to drive after heart surgery. °· Take any medicines your surgeon prescribes. Any medicines you took before your heart surgery should be checked with your health care provider before you start taking them again. °Wound care °· If your surgeon has prescribed an elastic bandage or stocking, ask how long you should wear it. °· Check the area around your surgical cuts (incisions) whenever your bandages (dressings) are changed. Look for any redness or swelling. °· You will need to return to have the stitches (sutures) or staples taken out. Ask your surgeon when to do that. °· Ask your surgeon when you can shower or bathe. °Activity °· Try to keep your legs raised when you are sitting. °· Do any exercises your health care providers have given you. These may include deep breathing exercises, coughing, walking, or other exercises. °SEEK MEDICAL CARE IF: °· You have any questions about your medicines. °· You have more leg pain, especially if your pain medicine stops working. °· New or growing bruises develop on your leg. °· Your leg swells, feels tight, or becomes red. °· You have numbness in your leg. °SEEK IMMEDIATE  MEDICAL CARE IF: °· Your pain gets much worse. °· Blood or fluid leaks from any of the incisions. °· Your incisions become warm, swollen, or red. °· You have chest pain. °· You have trouble breathing. °· You have a fever. °· You have more pain near your leg incision. °MAKE SURE YOU: °· Understand these instructions. °· Will watch your condition. °· Will get help right away if you are not doing well or get worse. °  °This information is not intended to replace advice given to you by your health care provider. Make sure you discuss any questions you have with your health care provider. °  °Document Released: 01/26/2011 Document Revised: 06/06/2014 Document Reviewed: 01/26/2011 °Elsevier Interactive Patient Education ©2016 [employer] °Coronary Artery Bypass Grafting, Care After °These instructions give you information on caring for yourself after your procedure. Your doctor may also give you more specific instructions. Call your doctor if you have any problems or questions after your procedure.  °HOME CARE °· Only take medicine as told by your doctor. Take medicines exactly as told. Do not stop taking medicines or start any new medicines without talking to your doctor first. °· Take your pulse as told by your doctor. °· Do deep breathing as told by your doctor. Use your breathing device (incentive spirometer), if given, to practice deep breathing several times a day. Support your chest with a pillow or your arms when you take deep breaths or cough. °· Keep the area clean, dry, and protected where the surgery cuts (incisions) were made. Remove bandages (dressings) only as told by your doctor. If strips were applied to surgical area, do not take   them off. They fall off on their own. °· Check the surgery area daily for puffiness (swelling), redness, or leaking fluid. °· If surgery cuts were made in your legs: °· Avoid crossing your legs. °· Avoid sitting for long periods of time. Change positions every 30  minutes. °· Raise your legs when you are sitting. Place them on pillows. °· Wear stockings that help keep blood clots from forming in your legs (compression stockings). °· Only take sponge baths until your doctor says it is okay to take showers. Pat the surgery area dry. Do not rub the surgery area with a washcloth or towel. Do not bathe, swim, or use a hot tub until your doctor says it is okay. °· Eat foods that are high in fiber. These include raw fruits and vegetables, whole grains, beans, and nuts. Choose lean meats. Avoid canned, processed, and fried foods. °· Drink enough fluids to keep your pee (urine) clear or pale yellow. °· Weigh yourself every day. °· Rest and limit activity as told by your doctor. You may be told to: °· Stop any activity if you have chest pain, shortness of breath, changes in heartbeat, or dizziness. Get help right away if this happens. °· Move around often for short amounts of time or take short walks as told by your doctor. Gradually become more active. You may need help to strengthen your muscles and build endurance. °· Avoid lifting, pushing, or pulling anything heavier than 10 pounds (4.5 kg) for at least 6 weeks after surgery. °· Do not drive until your doctor says it is okay. °· Ask your doctor when you can go back to work. °· Ask your doctor when you can begin sexual activity again. °· Follow up with your doctor as told. °GET HELP IF: °· You have puffiness, redness, more pain, or fluid draining from the incision site. °· You have a fever. °· You have puffiness in your ankles or legs. °· You have pain in your legs. °· You gain 2 or more pounds (0.9 kg) a day. °· You feel sick to your stomach (nauseous) or throw up (vomit). °· You have watery poop (diarrhea). °GET HELP RIGHT AWAY IF: °· You have chest pain that goes to your jaw or arms. °· You have shortness of breath. °· You have a fast or irregular heartbeat. °· You notice a "clicking" in your breastbone when you move. °· You  have numbness or weakness in your arms or legs. °· You feel dizzy or light-headed. °MAKE SURE YOU: °· Understand these instructions. °· Will watch your condition. °· Will get help right away if you are not doing well or get worse. °  °This information is not intended to replace advice given to you by your health care provider. Make sure you discuss any questions you have with your health care provider. °  °Document Released: 05/21/2013 Document Reviewed: 05/21/2013 °Elsevier Interactive Patient Education ©2016 [employer] Inc. ° °

## 2015-05-10 NOTE — Progress Notes (Signed)
Clipped sutures around epicardial pacing wires and removed chest tube sutures.  Removed epicardial pacing wires without difficulty.  Pt had no complaints during or following removal.  VS remained stable.  Will continue to monitor.  Prior chest tube and wire sites are dry and intact with 2x2 gauzes in place.  Pt and wife were given discharge instructions.  Pt was educated on diet, activity, meds, activity, and follow-up care and appointments.  Pt and wife verbalized understanding.  Pt will be taken home by family.  IV and tele will be discontinued prior to discharge.

## 2015-05-12 ENCOUNTER — Telehealth: Payer: Self-pay | Admitting: *Deleted

## 2015-05-13 ENCOUNTER — Telehealth: Payer: Self-pay | Admitting: Cardiology

## 2015-05-13 ENCOUNTER — Telehealth: Payer: Self-pay | Admitting: *Deleted

## 2015-05-13 NOTE — Telephone Encounter (Signed)
Patient's wife calling about her husband having nausea and vomiting earlier today. Patient is feeling better at this time. Informed patient's wife to follow-up with PCP. Patient's wife is concerned about his recent surgery on 05/04/15, so instructed patient to follow-up with Dr. Everrett Coombe office also. Patient's wife verbalized understanding.

## 2015-05-13 NOTE — Telephone Encounter (Signed)
Pt wife says pt c/o nausea and vomiting, told wife to contact pcp and will forward to Dr. Bronson Ing. Pt denies CP/SOB/dizziness

## 2015-05-14 ENCOUNTER — Encounter: Payer: Self-pay | Admitting: Cardiothoracic Surgery

## 2015-05-18 ENCOUNTER — Encounter: Payer: Self-pay | Admitting: Cardiovascular Disease

## 2015-05-18 ENCOUNTER — Ambulatory Visit (INDEPENDENT_AMBULATORY_CARE_PROVIDER_SITE_OTHER): Payer: Medicare Other | Admitting: Cardiovascular Disease

## 2015-05-18 VITALS — BP 100/50 | HR 70 | Ht 68.0 in | Wt 190.0 lb

## 2015-05-18 DIAGNOSIS — Z9289 Personal history of other medical treatment: Secondary | ICD-10-CM

## 2015-05-18 DIAGNOSIS — Z79899 Other long term (current) drug therapy: Secondary | ICD-10-CM

## 2015-05-18 DIAGNOSIS — I10 Essential (primary) hypertension: Secondary | ICD-10-CM | POA: Diagnosis not present

## 2015-05-18 DIAGNOSIS — I480 Paroxysmal atrial fibrillation: Secondary | ICD-10-CM

## 2015-05-18 DIAGNOSIS — Z951 Presence of aortocoronary bypass graft: Secondary | ICD-10-CM

## 2015-05-18 DIAGNOSIS — I2510 Atherosclerotic heart disease of native coronary artery without angina pectoris: Secondary | ICD-10-CM

## 2015-05-18 DIAGNOSIS — Z889 Allergy status to unspecified drugs, medicaments and biological substances status: Secondary | ICD-10-CM

## 2015-05-18 DIAGNOSIS — E785 Hyperlipidemia, unspecified: Secondary | ICD-10-CM

## 2015-05-18 DIAGNOSIS — Z87898 Personal history of other specified conditions: Secondary | ICD-10-CM

## 2015-05-18 DIAGNOSIS — Z789 Other specified health status: Secondary | ICD-10-CM

## 2015-05-18 DIAGNOSIS — I6523 Occlusion and stenosis of bilateral carotid arteries: Secondary | ICD-10-CM

## 2015-05-18 DIAGNOSIS — I712 Thoracic aortic aneurysm, without rupture, unspecified: Secondary | ICD-10-CM

## 2015-05-18 MED ORDER — BENAZEPRIL HCL 20 MG PO TABS: 20.0000 mg | ORAL_TABLET | Freq: Every day | ORAL | Status: DC

## 2015-05-18 NOTE — Progress Notes (Signed)
Patient ID: Eduardo [name], male   DOB: November 10, 1945, 69 y.o.   MRN: [id number]      SUBJECTIVE: The patient presents for post hospitalization follow-up. He underwent four-vessel coronary artery bypass graft surgery for unstable angina with a LIMA to the LAD, SVG to the second diagonal , SVG to the first obtuse marginal, and SVG to the PDA.  He developed some atrial fibrillation and was started on amiodarone. He was discharged on 05/10/15.  Echocardiogram on 05/01/15 showed normal left ventricular systolic function, EF 0000000, mild LVH, an inferior and inferoseptal hypokinesis. There was mild aortic regurgitation.  He is entirely intolerant to statin therapy.  He has several questions regarding his medications and physical activities. He denies exertional chest pain and shortness of breath. He would like to go to [place] for Christmas. He wonders when he can start golfing again and using his hot tub. He has felt somewhat fatigued and blood pressure is 100/50 today.   ECG performed in the office today demonstrates sinus rhythm with a diffuse nonspecific ST segment and T-wave abnormality.  Scheduled to see CT surgery on 06/11/15.  Review of Systems: As per "subjective", otherwise negative.  Allergies  Allergen Reactions  . Statins Other (See Comments)    Body aches    Current Outpatient Prescriptions  Medication Sig Dispense Refill  . albuterol (PROVENTIL HFA;VENTOLIN HFA) 108 (90 BASE) MCG/ACT inhaler Inhale 1 puff into the lungs every 6 (six) hours as needed for wheezing or shortness of breath.     [name] amiodarone (PACERONE) 200 MG tablet Take 1 tablet (200 mg total) by mouth every 12 (twelve) hours. For 7 days then once daily 70 tablet 1  . amLODipine-benazepril (LOTREL) 5-20 MG per capsule Take 1 capsule by mouth daily. 90 capsule 3  . aspirin EC 81 MG EC tablet Take 1 tablet (81 mg total) by mouth daily.    . carvedilol (COREG) 6.25 MG tablet Take 1 tablet (6.25 mg total) by  mouth 2 (two) times daily with a meal. 180 tablet 0  . clopidogrel (PLAVIX) 75 MG tablet TAKE 1 TABLET DAILY 90 tablet 2  . ezetimibe (ZETIA) 10 MG tablet Take 10 mg by mouth daily.    . fenofibrate micronized (LOFIBRA) 134 MG capsule TAKE 1 CAPSULE DAILY BEFORE BREAKFAST 90 capsule 1  . furosemide (LASIX) 40 MG tablet Take 1 tablet (40 mg total) by mouth daily. 14 tablet 0  . levothyroxine (SYNTHROID, LEVOTHROID) 25 MCG tablet Take 1 tablet (25 mcg total) by mouth daily before breakfast. 30 tablet 1  . metFORMIN (GLUCOPHAGE) 1000 MG tablet Take 1,000 mg by mouth 2 (two) times daily with a meal.    . potassium chloride SA (K-DUR,KLOR-CON) 20 MEQ tablet Take 1 tablet (20 mEq total) by mouth daily. 14 tablet 0  . sertraline (ZOLOFT) 100 MG tablet Take 100 mg by mouth daily.    [name] tiotropium (SPIRIVA) 18 MCG inhalation capsule Place 18 mcg into inhaler and inhale daily.    . traMADol (ULTRAM) 50 MG tablet Take 1-2 tablets (50-100 mg total) by mouth every 4 (four) hours as needed for moderate pain. 50 tablet 0   No current facility-administered medications for this visit.    Past Medical History  Diagnosis Date  . COPD (chronic obstructive pulmonary disease) ([place])   . CAD (coronary artery disease)   . Diabetes mellitus without complication ([place])   . Hypertension   . Sleep apnea   . Chemical exposure     agent orange   .  PTSD (post-traumatic stress disorder)   . Diverticulitis   . Obesity (BMI 30-39.9)   . Bilateral carotid artery disease ([place]) 04/28/2015    1-39 percent bilateral stenosis noted on Doppler   . Lumbar disc disease   . Cervical disc disease     Past Surgical History  Procedure Laterality Date  . Lumbar laminectomy    . Cardiac catheterization    . Cholecystectomy    . Shoulder surgery Bilateral   . Replacement total knee      right   . Ptca      unsuccesful  . Cervical spine surgery    . I&d extremity Left 05/30/2013    Procedure: IRRIGATION AND DEBRIDEMENT Left  Hand and Foreign body removal;  Surgeon: [name], MD;  Location: [place];  Service: Orthopedics;  Laterality: Left;  [name] Eye surgery Right     cataract  . Cardiac catheterization  03/18/14    difficult to determine culprit vesel  . Left heart catheterization with coronary angiogram N/A 03/18/2014    Procedure: LEFT HEART CATHETERIZATION WITH CORONARY ANGIOGRAM;  Surgeon: [name], MD;  Location: [facility] CATH LAB;  Service: Cardiovascular;  Laterality: N/A;  . Partial colectomy      For diverticulitis  . Cardiac catheterization N/A 04/29/2015    Procedure: Left Heart Cath and Coronary Angiography;  Surgeon: [name], MD;  Location: [place] CV LAB;  Service: Cardiovascular;  Laterality: N/A;  . Coronary artery bypass graft N/A 05/04/2015    Procedure: CORONARY ARTERY BYPASS GRAFTING (CABG)  x four,  using left internal mammary artery, and bilateral thigh greater saphenous veins;  Surgeon: [name], MD;  Location: [place];  Service: Open Heart Surgery;  Laterality: N/A;  . Tee without cardioversion N/A 05/04/2015    Procedure: TRANSESOPHAGEAL ECHOCARDIOGRAM (TEE);  Surgeon: [name], MD;  Location: [place];  Service: Open Heart Surgery;  Laterality: N/A;    Social History   Social History  . Marital Status: Married    Spouse Name: N/A  . Number of Children: N/A  . Years of Education: N/A   Occupational History  . Not on file.   Social History Main Topics  . Smoking status: Former Smoker -- 0.75 packs/day for 30 years    Types: Cigarettes    Start date: 01/02/1961    Quit date: 05/31/1987  . Smokeless tobacco: Never Used  . Alcohol Use: No  . Drug Use: No  . Sexual Activity: Not on file   Other Topics Concern  . Not on file   Social History Narrative     Filed Vitals:   05/18/15 1317  BP: 100/50  Pulse: 70  Height: 5\' 8"  (1.727 m)  Weight: 190 lb (86.183 kg)  SpO2: 96%    PHYSICAL EXAM General: NAD HEENT: Normal. Neck: No JVD, no  thyromegaly. Lungs: Clear to auscultation bilaterally with normal respiratory effort. CV: Well-healed sternotomy incision. Regular rate and rhythm, normal S1/S2, no S3/S4, no murmur. No pretibial or periankle edema.  No carotid bruit. .  Abdomen: Soft, nontender, no hepatosplenomegaly, no distention.  Neurologic: Alert and oriented x 3.  Psych: Normal affect. Skin: Normal. Musculoskeletal: Normal range of motion, no gross deformities. Extremities: No clubbing or cyanosis.   ECG: Most recent ECG reviewed.      ASSESSMENT AND PLAN: 1. CAD s/p 4-v CABG: Stable ischemic heart disease. Continue aspirin 81 mg, Plavix 75 mg, and Coreg 6.25 mg twice daily. Given fatigue and relative hypotension,  will d/c amlodipine and continue benazepril 20 mg daily. Will make referral to cardiac rehabilitation.  2. Essential HTN: Given fatigue and relative hypotension, will d/c amlodipine and continue benazepril 20 mg daily.   3. Hyperlipidemia: Intolerant to statin therapy. 04/29/15 HDL 29, LDL 36, TC 122, TG 284. Continue Zetia 10 mg.  4. Thoracic aortic aneurysm: Followed by [facility].  5. Carotid artery stenosis: Carotid Dopplers in October 2015 demonstrated 1-39% bilateral carotid artery stenosis. Continue aspirin and Plavix. Intolerant to statins.  6. Paroxysmal atrial fibrillation: Will monitor and continue amiodarone for now. Will check TSH and LFT's.  Dispo: f/u 3 months.  Time spent: 40 minutes, of which greater than 50% was spent reviewing symptoms, relevant blood tests and studies, and discussing management plan with the patient.   [name], M.D., F.A.C.C.

## 2015-05-18 NOTE — Patient Instructions (Addendum)
   Stop Lotrel (Benazepril / HCTZ).   Begin Benazepril 20mg  daily - new 30 day sent to [facility] 90 day sent to [facility] today. Continue all other medications.   Labs for TSH, Liver function - orders given today. Office will contact with results via phone or letter.   Cardiac Rehab referral.   Follow up in  3 months

## 2015-05-22 DIAGNOSIS — K529 Noninfective gastroenteritis and colitis, unspecified: Secondary | ICD-10-CM | POA: Diagnosis not present

## 2015-05-22 DIAGNOSIS — R1110 Vomiting, unspecified: Secondary | ICD-10-CM | POA: Diagnosis not present

## 2015-05-22 DIAGNOSIS — B349 Viral infection, unspecified: Secondary | ICD-10-CM | POA: Diagnosis not present

## 2015-05-22 DIAGNOSIS — Z7982 Long term (current) use of aspirin: Secondary | ICD-10-CM | POA: Diagnosis not present

## 2015-05-22 DIAGNOSIS — I2510 Atherosclerotic heart disease of native coronary artery without angina pectoris: Secondary | ICD-10-CM | POA: Diagnosis not present

## 2015-05-22 DIAGNOSIS — F419 Anxiety disorder, unspecified: Secondary | ICD-10-CM | POA: Diagnosis not present

## 2015-05-22 DIAGNOSIS — J449 Chronic obstructive pulmonary disease, unspecified: Secondary | ICD-10-CM | POA: Diagnosis not present

## 2015-05-22 DIAGNOSIS — E860 Dehydration: Secondary | ICD-10-CM | POA: Diagnosis not present

## 2015-05-22 DIAGNOSIS — D72829 Elevated white blood cell count, unspecified: Secondary | ICD-10-CM | POA: Diagnosis not present

## 2015-05-22 DIAGNOSIS — E1140 Type 2 diabetes mellitus with diabetic neuropathy, unspecified: Secondary | ICD-10-CM | POA: Diagnosis not present

## 2015-05-22 DIAGNOSIS — I10 Essential (primary) hypertension: Secondary | ICD-10-CM | POA: Diagnosis not present

## 2015-05-22 DIAGNOSIS — Z79899 Other long term (current) drug therapy: Secondary | ICD-10-CM | POA: Diagnosis not present

## 2015-05-27 ENCOUNTER — Encounter (HOSPITAL_COMMUNITY): Payer: Self-pay

## 2015-05-27 ENCOUNTER — Encounter (HOSPITAL_COMMUNITY)
Admission: RE | Admit: 2015-05-27 | Discharge: 2015-05-27 | Disposition: A | Discharge status: Home / Self Care | Payer: Medicare Other | Source: Ambulatory Visit | Attending: Cardiovascular Disease | Admitting: Cardiovascular Disease

## 2015-05-27 VITALS — BP 120/60 | HR 69 | Ht 68.0 in | Wt 185.6 lb

## 2015-05-27 DIAGNOSIS — Z951 Presence of aortocoronary bypass graft: Secondary | ICD-10-CM | POA: Insufficient documentation

## 2015-05-27 NOTE — Progress Notes (Signed)
Cardiac/Pulmonary Rehab Medication Review by a Pharmacist  Does the patient  feel that his/her medications are working for him/her?  yes  Has the patient been experiencing any side effects to the medications prescribed?  no  Does the patient measure his/her own blood pressure or blood glucose at home?  yes   Does the patient have any problems obtaining medications due to transportation or finances?   no  Understanding of regimen: good Understanding of indications: good Potential of compliance: good  Questions asked to Determine Patient Understanding of Medication Regimen:  1. What is the name of the medication?  2. What is the medication used for?  3. When should it be taken?  4. How much should be taken?  5. How will you take it?  6. What side effects should you report?  Understanding Defined as: Excellent: All questions above are correct Good: Questions 1-4 are correct Fair: Questions 1-2 are correct  Poor: 1 or none of the above questions are correct   Pharmacist comments: Pt is not c/o any side effects from medications.  Pt does check BP and blood sugar daily.  Pt has good understanding of indications for medications he is taking.  Eduardo [name] A 05/27/2015 1:37 PM

## 2015-05-27 NOTE — Progress Notes (Signed)
Patient arrived for 1st visit/orientation/education at 1310. Patient was referred to CR by Dr. Bronson Ing due to CABG X4 (Z95.1). During orientation advised patient on arrival and appointment times what to wear, what to do before, during and after exercise. Reviewed attendance and class policy. Talked about inclement weather and class consultation policy. Pt is scheduled to return Cardiac Rehab on 06/03/15 at 345 pm. Pt was advised to come to class 5 minutes before class starts. He was also given instructions on meeting with the dietician and attending the Family Structure classes. Pt is eager to get started. Patient was able to complete 6 minute walk test. Patient was measured for the equipment. Discussed equipment safety with patient. Took patient pre-anthropometric measurements. Entrance PHQ 9 score is 0.  Patient finished visit at 1445.

## 2015-05-27 NOTE — Patient Instructions (Signed)
Pt has finished orientation and is scheduled to return to CR on 06/03/15 at 345 PM. Pt has been instructed to arrive to class 15 minutes early for scheduled class. Pt has been instructed to wear comfortable clothing and shoes with rubber soles. Pt has been told to take their medications 1 hour prior to coming to class.  If the patient is not going to attend class, he has been instructed to call.

## 2015-06-03 ENCOUNTER — Encounter (HOSPITAL_COMMUNITY)
Admission: RE | Admit: 2015-06-03 | Discharge: 2015-06-03 | Disposition: A | Discharge status: Home / Self Care | Payer: Medicare Other | Source: Ambulatory Visit | Attending: Cardiovascular Disease | Admitting: Cardiovascular Disease

## 2015-06-03 DIAGNOSIS — Z951 Presence of aortocoronary bypass graft: Secondary | ICD-10-CM | POA: Insufficient documentation

## 2015-06-04 ENCOUNTER — Telehealth: Payer: Self-pay | Admitting: Pediatrics

## 2015-06-05 ENCOUNTER — Other Ambulatory Visit: Payer: Self-pay | Admitting: Cardiothoracic Surgery

## 2015-06-05 ENCOUNTER — Encounter (HOSPITAL_COMMUNITY)
Admission: RE | Admit: 2015-06-05 | Discharge: 2015-06-05 | Disposition: A | Discharge status: Home / Self Care | Payer: Medicare Other | Source: Ambulatory Visit | Attending: Cardiovascular Disease | Admitting: Cardiovascular Disease

## 2015-06-05 DIAGNOSIS — Z951 Presence of aortocoronary bypass graft: Secondary | ICD-10-CM | POA: Diagnosis not present

## 2015-06-08 ENCOUNTER — Encounter (HOSPITAL_COMMUNITY): Payer: Medicare Other

## 2015-06-08 ENCOUNTER — Ambulatory Visit (INDEPENDENT_AMBULATORY_CARE_PROVIDER_SITE_OTHER): Payer: Self-pay | Admitting: Surgical

## 2015-06-08 ENCOUNTER — Ambulatory Visit (HOSPITAL_COMMUNITY)
Admission: RE | Admit: 2015-06-08 | Discharge: 2015-06-08 | Disposition: A | Discharge status: Home / Self Care | Payer: Medicare Other | Source: Ambulatory Visit | Attending: Cardiothoracic Surgery | Admitting: Cardiothoracic Surgery

## 2015-06-08 VITALS — BP 121/75 | HR 70 | Resp 20 | Ht 68.0 in | Wt 185.0 lb

## 2015-06-08 DIAGNOSIS — Z951 Presence of aortocoronary bypass graft: Secondary | ICD-10-CM

## 2015-06-08 DIAGNOSIS — R918 Other nonspecific abnormal finding of lung field: Secondary | ICD-10-CM | POA: Insufficient documentation

## 2015-06-08 NOTE — Patient Instructions (Signed)
The patient was given verbal instructions regarding lifestyle modifications and activity progression

## 2015-06-08 NOTE — Progress Notes (Signed)
[address]       ,[place] [address]             [phone]                  Galo H [facility] Medical Record [id number] Date of Birth: 12/01/1945  Referring [id number], Ander Slade, MD Primary Cardiology: Primary Care:Carol Esther Hardy, MD  Chief Complaint:  Follow Up Visit  DATE OF PROCEDURE: 05/04/2015 DATE OF DISCHARGE:   OPERATIVE REPORT   PREOPERATIVE DIAGNOSIS: Unstable angina with 3-vessel coronary artery disease.  POSTOPERATIVE DIAGNOSIS: Unstable angina with 3-vessel coronary artery disease.  SURGICAL PROCEDURE: Coronary artery bypass grafting x4 with the left internal mammary to the left anterior descending coronary artery, reverse saphenous vein graft to the 2nd diagonal coronary artery, reverse saphenous vein graft to the first obtuse marginal, reverse saphenous vein graft to the posterior descending coronary artery with bilateral greater saphenous thigh endoscopic vein harvesting.  SURGEON: Lanelle Bal, MD  FIRST ASSISTANT: Lars Pinks, PA  History of Present Illness:     The  patient is a 70 -year-old male status post above described procedure. He is doing well without any current specific difficulties. He denies chest pain, shortness of breath, fevers, chills or other constitutional symptoms. He denies significant pain related to his incisions. He has been driving without difficulty. He feels as though his overall progress is excellent. He has also lost 18 pounds since the procedure.        Zubrod Score: At the time of surgery this patient's most appropriate activity status/level should be described as: []     0    Normal activity, no symptoms []     1    Restricted in physical strenuous activity but ambulatory, able to do out light work []     2    Ambulatory and capable of self care, unable to do work activities, up and about                 >50 % of waking hours                                                                                    []     3    Only limited self care, in bed greater than 50% of waking hours []     4    Completely disabled, no self care, confined to bed or chair []     5    Moribund  History  Smoking status  . Former Smoker -- 0.75 packs/day for 30 years  . Types: Cigarettes  . Start date: 01/02/1961  . Quit date: 05/31/1987  Smokeless tobacco  . Never Used       Allergies  Allergen Reactions  . Statins Other (See Comments)    Body aches    Current Outpatient Prescriptions  Medication Sig Dispense Refill  . albuterol (PROVENTIL HFA;VENTOLIN HFA) 108 (90 BASE) MCG/ACT inhaler Inhale 1 puff into the lungs every 6 (six) hours as needed for wheezing or shortness of breath.     [name] aspirin EC 81 MG EC tablet Take 1 tablet (81 mg total)  by mouth daily.    . benazepril (LOTENSIN) 20 MG tablet Take 1 tablet (20 mg total) by mouth daily. 90 tablet 3  . carvedilol (COREG) 6.25 MG tablet Take 1 tablet (6.25 mg total) by mouth 2 (two) times daily with a meal. 180 tablet 0  . clopidogrel (PLAVIX) 75 MG tablet TAKE 1 TABLET DAILY 90 tablet 2  . ezetimibe (ZETIA) 10 MG tablet Take 10 mg by mouth daily.    . fenofibrate micronized (LOFIBRA) 134 MG capsule TAKE 1 CAPSULE DAILY BEFORE BREAKFAST 90 capsule 1  . levothyroxine (SYNTHROID, LEVOTHROID) 25 MCG tablet Take 1 tablet (25 mcg total) by mouth daily before breakfast. 30 tablet 1  . meloxicam (MOBIC) 15 MG tablet Take 15 mg by mouth daily as needed for pain (prn inflammation).    . metFORMIN (GLUCOPHAGE) 1000 MG tablet Take 1,000 mg by mouth 2 (two) times daily with a meal.    . sertraline (ZOLOFT) 100 MG tablet Take 100 mg by mouth daily.    [name] tiotropium (SPIRIVA) 18 MCG inhalation capsule Place 18 mcg into inhaler and inhale daily.     No current facility-administered medications for this visit.       Physical Exam: BP 121/75 mmHg  Pulse 70  Resp 20  Ht 5\' 8"  (1.727 m)   Wt 185 lb (83.915 kg)  BMI 28.14 kg/m2  SpO2 98%  General appearance: alert, cooperative and no distress Heart: regular rate and rhythm Lungs: clear to auscultation bilaterally Abdomen: soft, non-tender; bowel sounds normal; no masses,  no organomegaly Extremities: extremities normal, atraumatic, no cyanosis or edema Wounds: well-healed  Diagnostic Studies & Laboratory data:         Recent Radiology Findings: Dg Chest 2 View  06/08/2015  CLINICAL DATA:  Post CABG 1 month ago EXAM: CHEST  2 VIEW COMPARISON:  05/09/2015 FINDINGS: Cardiomediastinal silhouette is stable. No acute infiltrate or pulmonary edema. Status post CABG. Mild elevation of the right hemidiaphragm again noted. Minimal left basilar atelectasis or scarring. Mild degenerative changes thoracic spine. IMPRESSION: No active disease. Status post CABG. Minimal left basilar atelectasis or scarring. Electronically Signed   By: Lahoma Crocker M.D.   On: 06/08/2015 14:12      I have independently reviewed the above radiology findings and reviewed findings  with the patient.  Recent Labs: Lab Results  Component Value Date   WBC 7.9 05/09/2015   HGB 10.9* 05/09/2015   HCT 32.5* 05/09/2015   PLT 196 05/09/2015   GLUCOSE 125* 05/10/2015   CHOL 122 04/29/2015   TRIG 284* 04/29/2015   HDL 29* 04/29/2015   LDLCALC 36 04/29/2015   ALT 22 05/08/2015   AST 20 05/08/2015   NA 139 05/10/2015   K 3.9 05/10/2015   CL 100* 05/10/2015   CREATININE 0.84 05/10/2015   BUN 20 05/10/2015   CO2 31 05/10/2015   TSH 8.055* 05/08/2015   INR 1.28 05/06/2015   HGBA1C 7.3* 04/28/2015      Assessment / Plan:   The patient is making excellent ongoing progress. There are no specific cardiothoracic surgically related issues at this time. I encouraged him to continue lifestyle modifications and he has already started cardiac rehabilitation. We discussed ongoing activity progression. We will see him again on a when necessary basis for any surgically  related issues or at request.        Sundae Maners E 06/08/2015 3:00 PM

## 2015-06-09 NOTE — Progress Notes (Signed)
Cardiac Rehabilitation Program Outcomes Report   Orientation:  05/27/15 Graduate Date:  tba Discharge Date:  tba # of sessions completed: 3  Cardiologist: Bronson Ing Family MD:  Nyoka Cowden Time:  [id number]  A.  Exercise Program:  Tolerates exercise @ 2.80 METS for 15 minutes and Walk Test Results:  Pre: 3.11  B.  Mental Health:  Good mental attitude and PHQ-9: 0  C.  Education/Instruction/Skills  Accurately checks own pulse.  Rest:  67  Exercise:  86  Uses Perceived Exertion Scale and/or Dyspnea Scale  D.  Nutrition/Weight Control/Body Composition:  Adherence to prescribed nutrition program: fair    E.  Blood Lipids    Lab Results  Component Value Date   CHOL 122 04/29/2015   HDL 29* 04/29/2015   LDLCALC 36 04/29/2015   TRIG 284* 04/29/2015   CHOLHDL 4.2 04/29/2015    F.  Lifestyle Changes:  Making positive lifestyle changes and Not smoking:  Quit 1989  G.  Symptoms noted with exercise:  Asymptomatic  Report Completed By:  Stevphen Rochester RN   Comments:  This is the patients first week progress note for [facility].

## 2015-06-10 ENCOUNTER — Encounter (HOSPITAL_COMMUNITY)
Admission: RE | Admit: 2015-06-10 | Discharge: 2015-06-10 | Disposition: A | Discharge status: Home / Self Care | Payer: Medicare Other | Source: Ambulatory Visit | Attending: Cardiovascular Disease | Admitting: Cardiovascular Disease

## 2015-06-10 DIAGNOSIS — Z951 Presence of aortocoronary bypass graft: Secondary | ICD-10-CM | POA: Diagnosis not present

## 2015-06-11 ENCOUNTER — Encounter: Payer: TRICARE For Life (TFL) | Admitting: Cardiothoracic Surgery

## 2015-06-12 ENCOUNTER — Encounter (HOSPITAL_COMMUNITY): Payer: Medicare Other

## 2015-06-15 ENCOUNTER — Encounter (HOSPITAL_COMMUNITY)
Admission: RE | Admit: 2015-06-15 | Discharge: 2015-06-15 | Disposition: A | Discharge status: Home / Self Care | Payer: Medicare Other | Source: Ambulatory Visit | Attending: Cardiovascular Disease | Admitting: Cardiovascular Disease

## 2015-06-15 DIAGNOSIS — Z951 Presence of aortocoronary bypass graft: Secondary | ICD-10-CM | POA: Diagnosis not present

## 2015-06-17 ENCOUNTER — Encounter (HOSPITAL_COMMUNITY)
Admission: RE | Admit: 2015-06-17 | Discharge: 2015-06-17 | Disposition: A | Discharge status: Home / Self Care | Payer: Medicare Other | Source: Ambulatory Visit | Attending: Cardiovascular Disease | Admitting: Cardiovascular Disease

## 2015-06-17 DIAGNOSIS — Z951 Presence of aortocoronary bypass graft: Secondary | ICD-10-CM | POA: Diagnosis not present

## 2015-06-19 ENCOUNTER — Encounter (HOSPITAL_COMMUNITY)
Admission: RE | Admit: 2015-06-19 | Discharge: 2015-06-19 | Disposition: A | Discharge status: Home / Self Care | Payer: Medicare Other | Source: Ambulatory Visit | Attending: Cardiovascular Disease | Admitting: Cardiovascular Disease

## 2015-06-19 DIAGNOSIS — Z951 Presence of aortocoronary bypass graft: Secondary | ICD-10-CM | POA: Diagnosis not present

## 2015-06-22 ENCOUNTER — Encounter (HOSPITAL_COMMUNITY)
Admission: RE | Admit: 2015-06-22 | Discharge: 2015-06-22 | Disposition: A | Discharge status: Home / Self Care | Payer: Medicare Other | Source: Ambulatory Visit | Attending: Cardiovascular Disease | Admitting: Cardiovascular Disease

## 2015-06-22 DIAGNOSIS — Z951 Presence of aortocoronary bypass graft: Secondary | ICD-10-CM | POA: Diagnosis not present

## 2015-06-23 NOTE — Progress Notes (Signed)
Patient was given individual home exercise plan. Handout was reviewed and discussed. Patient's long term goals were reassessed. Patient verbalized an understanding. 

## 2015-06-24 ENCOUNTER — Encounter (HOSPITAL_COMMUNITY)
Admission: RE | Admit: 2015-06-24 | Discharge: 2015-06-24 | Disposition: A | Discharge status: Home / Self Care | Payer: Medicare Other | Source: Ambulatory Visit | Attending: Cardiovascular Disease | Admitting: Cardiovascular Disease

## 2015-06-24 DIAGNOSIS — Z951 Presence of aortocoronary bypass graft: Secondary | ICD-10-CM | POA: Diagnosis not present

## 2015-06-26 ENCOUNTER — Encounter (HOSPITAL_COMMUNITY)
Admission: RE | Admit: 2015-06-26 | Discharge: 2015-06-26 | Disposition: A | Discharge status: Home / Self Care | Payer: Medicare Other | Source: Ambulatory Visit | Attending: Cardiovascular Disease | Admitting: Cardiovascular Disease

## 2015-06-26 DIAGNOSIS — Z951 Presence of aortocoronary bypass graft: Secondary | ICD-10-CM | POA: Diagnosis not present

## 2015-06-29 ENCOUNTER — Encounter (HOSPITAL_COMMUNITY)
Admission: RE | Admit: 2015-06-29 | Discharge: 2015-06-29 | Disposition: A | Discharge status: Home / Self Care | Payer: Medicare Other | Source: Ambulatory Visit | Attending: Cardiovascular Disease | Admitting: Cardiovascular Disease

## 2015-06-29 DIAGNOSIS — Z951 Presence of aortocoronary bypass graft: Secondary | ICD-10-CM | POA: Diagnosis not present

## 2015-07-01 ENCOUNTER — Encounter (HOSPITAL_COMMUNITY)
Admission: RE | Admit: 2015-07-01 | Discharge: 2015-07-01 | Disposition: A | Discharge status: Home / Self Care | Payer: Medicare Other | Source: Ambulatory Visit | Attending: Cardiovascular Disease | Admitting: Cardiovascular Disease

## 2015-07-01 DIAGNOSIS — Z951 Presence of aortocoronary bypass graft: Secondary | ICD-10-CM | POA: Insufficient documentation

## 2015-07-03 ENCOUNTER — Encounter (HOSPITAL_COMMUNITY)
Admission: RE | Admit: 2015-07-03 | Discharge: 2015-07-03 | Disposition: A | Discharge status: Home / Self Care | Payer: Medicare Other | Source: Ambulatory Visit | Attending: Cardiovascular Disease | Admitting: Cardiovascular Disease

## 2015-07-03 DIAGNOSIS — Z951 Presence of aortocoronary bypass graft: Secondary | ICD-10-CM | POA: Diagnosis not present

## 2015-07-04 DIAGNOSIS — Z981 Arthrodesis status: Secondary | ICD-10-CM | POA: Diagnosis not present

## 2015-07-04 DIAGNOSIS — E119 Type 2 diabetes mellitus without complications: Secondary | ICD-10-CM | POA: Diagnosis not present

## 2015-07-04 DIAGNOSIS — Z951 Presence of aortocoronary bypass graft: Secondary | ICD-10-CM | POA: Diagnosis not present

## 2015-07-04 DIAGNOSIS — Z79899 Other long term (current) drug therapy: Secondary | ICD-10-CM | POA: Diagnosis not present

## 2015-07-04 DIAGNOSIS — Z7902 Long term (current) use of antithrombotics/antiplatelets: Secondary | ICD-10-CM | POA: Diagnosis not present

## 2015-07-04 DIAGNOSIS — F329 Major depressive disorder, single episode, unspecified: Secondary | ICD-10-CM | POA: Diagnosis not present

## 2015-07-04 DIAGNOSIS — I2510 Atherosclerotic heart disease of native coronary artery without angina pectoris: Secondary | ICD-10-CM | POA: Diagnosis not present

## 2015-07-04 DIAGNOSIS — E039 Hypothyroidism, unspecified: Secondary | ICD-10-CM | POA: Diagnosis not present

## 2015-07-04 DIAGNOSIS — J449 Chronic obstructive pulmonary disease, unspecified: Secondary | ICD-10-CM | POA: Diagnosis not present

## 2015-07-04 DIAGNOSIS — Z7982 Long term (current) use of aspirin: Secondary | ICD-10-CM | POA: Diagnosis not present

## 2015-07-04 DIAGNOSIS — Z7984 Long term (current) use of oral hypoglycemic drugs: Secondary | ICD-10-CM | POA: Diagnosis not present

## 2015-07-04 DIAGNOSIS — R079 Chest pain, unspecified: Secondary | ICD-10-CM | POA: Diagnosis not present

## 2015-07-04 DIAGNOSIS — Z96651 Presence of right artificial knee joint: Secondary | ICD-10-CM | POA: Diagnosis not present

## 2015-07-04 DIAGNOSIS — R0789 Other chest pain: Secondary | ICD-10-CM | POA: Diagnosis not present

## 2015-07-05 DIAGNOSIS — R079 Chest pain, unspecified: Secondary | ICD-10-CM | POA: Diagnosis not present

## 2015-07-06 ENCOUNTER — Encounter (HOSPITAL_COMMUNITY)
Admission: RE | Admit: 2015-07-06 | Discharge: 2015-07-06 | Disposition: A | Discharge status: Home / Self Care | Payer: Medicare Other | Source: Ambulatory Visit | Attending: Cardiovascular Disease | Admitting: Cardiovascular Disease

## 2015-07-06 DIAGNOSIS — Z951 Presence of aortocoronary bypass graft: Secondary | ICD-10-CM | POA: Diagnosis not present

## 2015-07-08 ENCOUNTER — Encounter (HOSPITAL_COMMUNITY)
Admission: RE | Admit: 2015-07-08 | Discharge: 2015-07-08 | Disposition: A | Discharge status: Home / Self Care | Payer: Medicare Other | Source: Ambulatory Visit | Attending: Cardiovascular Disease | Admitting: Cardiovascular Disease

## 2015-07-08 DIAGNOSIS — Z951 Presence of aortocoronary bypass graft: Secondary | ICD-10-CM | POA: Diagnosis not present

## 2015-07-09 ENCOUNTER — Encounter: Payer: Self-pay | Admitting: Pediatrics

## 2015-07-09 ENCOUNTER — Ambulatory Visit (INDEPENDENT_AMBULATORY_CARE_PROVIDER_SITE_OTHER): Payer: Medicare Other | Admitting: Pediatrics

## 2015-07-09 VITALS — BP 138/73 | HR 63 | Temp 97.3°F | Ht 68.0 in | Wt 191.8 lb

## 2015-07-09 DIAGNOSIS — E039 Hypothyroidism, unspecified: Secondary | ICD-10-CM

## 2015-07-09 DIAGNOSIS — I10 Essential (primary) hypertension: Secondary | ICD-10-CM

## 2015-07-09 DIAGNOSIS — J439 Emphysema, unspecified: Secondary | ICD-10-CM | POA: Diagnosis not present

## 2015-07-09 DIAGNOSIS — E669 Obesity, unspecified: Secondary | ICD-10-CM

## 2015-07-09 DIAGNOSIS — I25110 Atherosclerotic heart disease of native coronary artery with unstable angina pectoris: Secondary | ICD-10-CM

## 2015-07-09 DIAGNOSIS — Z1159 Encounter for screening for other viral diseases: Secondary | ICD-10-CM

## 2015-07-09 DIAGNOSIS — M25512 Pain in left shoulder: Secondary | ICD-10-CM | POA: Diagnosis not present

## 2015-07-09 DIAGNOSIS — E785 Hyperlipidemia, unspecified: Secondary | ICD-10-CM

## 2015-07-09 NOTE — Patient Instructions (Signed)
Anterior Shoulder Instability With Rehab Anterior shoulder instability is a condition that is characterized by recurrent dislocation of the shoulder joint. Dislocation is an injury in which two adjacent bones are no longer in proper alignment, and the joint surfaces are no longer touching. Subluxation is a similar injury to dislocation; however, the joint surfaces are still touching. Dislocations and subluxations of the shoulder joint (glenohumeral) most commonly involve the upper arm bone (humerus) displacing forward (anteriorly). The shoulder joint allows more motion than any other joint in the body, and because of this it is highly susceptible to injury. When the glenohumeral joint is dislocated or subluxated, the muscles that control the shoulder joint (rotator cuff) tendons become stretched. Repetitive injury results in the shoulder joint becoming loose and results in instability of the shoulder joint. These injuries may also cause a tear in the cartilage that lines the joint and helps keep the humerus head in place (labrum). SYMPTOMS   Severe shoulder pain when the joint is dislocated or subluxated.  Shoulder weakness, pain, and/or inflammation.  Loss of shoulder function.  Pain that worsens with shoulder function, especially motions that involve arm movements above shoulder height.  Feeling of shoulder weakness or instability.  Signs of nerve damage: numbness or paralysis.  Crackling (crepitation) feeling and sound when the injured area is touched or with shoulder motion. CAUSES  Anterior shoulder instability is caused by injury to the glenohumeral joint that causes it to become dislocated or subluxated. Common mechanisms of injury include:  Direct trauma to the shoulder joint.  Repetitive and/or strenuous movements of the shoulder joint, especially those with the arm above shoulder height.  Sprain of one of the ligaments of the shoulder joint.  An abnormality you are born with  (congenital). This includes a shallow or malformed joint surface. RISK INCREASES WITH:  Contact sports (football, wrestling, and basketball).  Activities that involve repetitive and/or strenuous movements of the shoulder joint, especially those with the arm above shoulder height (baseball, volleyball, or swimming).  Previous shoulder injury.  Poor strength and flexibility. PREVENTION  Warm up and stretch properly before activity.  Allow for adequate recovery between workouts.  Maintain physical fitness:  Strength, flexibility, and endurance.  Cardiovascular fitness.  Learn and use proper technique. When possible, have a coach correct improper technique.  Wear properly fitted and padded protective equipment. PROGNOSIS  The extent of recovery and likelihood of future dislocations and subluxations depends on the extent of damage done to the shoulder.  RELATED COMPLICATIONS   Damage to the nervous system or blood vessels that may cause weakness, paralysis, numbness, coldness, and paleness.  Damage to the bones or cartilage of the shoulder joint.  Permanent shoulder instability.  Tear of one or more of the rotator cuff tendons.  Arthritis of the shoulder. TREATMENT When the shoulder joint is dislocated it must be realigned (reduced) by someone who is trained in the procedure. Occasionally reduction cannot be performed manually, and requires surgery. After reduction, the use of ice and medication may help reduce pain and inflammation. The shoulder should be immobilized with a sling for 3 to 8 weeks to allow the joint to heal. After immobilization, it is important to perform strengthening and stretching exercises to help regain strength and a full range of motion. These exercises may be completed at home or with a therapist. Surgery is reserved for individuals who have sustained multiple shoulder dislocations due to shoulder instability. MEDICATION   General anesthesia or muscle  relaxants may be necessary for  reduction of the shoulder joint.  If pain medication is necessary, then nonsteroidal anti-inflammatory medications, such as aspirin and ibuprofen, or other minor pain relievers, such as acetaminophen, are often recommended.  Do not take pain medication within 7 days before surgery.  Prescription pain relievers may be given if deemed necessary by your caregiver. Use only as directed and only as much as you need. COLD THERAPY  Cold treatment (icing) relieves pain and reduces inflammation. Cold treatment should be applied for 10 to 15 minutes every 2 to 3 hours for inflammation and pain and immediately after any activity that aggravates your symptoms. Use ice packs or an ice massage. SEEK MEDICAL CARE IF:  Treatment seems to offer no benefit, or the condition worsens.  Any medications produce adverse side effects.  Any complications from surgery occur:  Pain, numbness, or coldness in the extremity operated upon.  Discoloration of the nail beds (they become blue or gray) of the extremity operated upon.  Signs of infections (fever, pain, inflammation, redness, or persistent bleeding). EXERCISES RANGE OF MOTION (ROM) AND STRETCHING EXERCISES - Shoulder Instability, Anterior These exercises may help you restore your shoulder mobility once your physician has discontinued your 2-6 weeks of immobilization. Beginning these before your provider's approval may result in delayed healing. Your symptoms may resolve with or without further involvement from your physician, physical therapist or athletic trainer. While completing these exercises, remember:   Restoring tissue flexibility helps normal motion to return to the joints. This allows healthier, less painful movement and activity.  An effective stretch should be held for at least 30 seconds. A stretch should never be painful. You should only feel a gentle lengthening or release in the stretch.  During your recovery,  avoid activity or exercises which involve actions that place your right / left hand or elbow above your head or behind your back or head. These positions stress the tissues which are trying to heal. ROM - Pendulum  Bend at the waist so that your right / left arm falls away from your body. Support yourself with your opposite hand on a solid surface, such as a table or a countertop.  Your right / left arm should be perpendicular to the ground. If it is not perpendicular, you need to lean over farther. Relax the muscles in your right / left arm and shoulder as much as possible.  Gently sway your hips and trunk so they move your arm without any use of your right / left shoulder muscles.  Progress your movements so that your arm moves side to side, then forward and backward, and finally, both clockwise and counterclockwise.  Complete __________ repetitions in each direction. Many people use this exercise to relieve discomfort in their shoulder as well as to gain range of motion. Repeat __________ times. Complete this exercise __________ times per day. STRETCH - Flexion, Seated  Sit in a firm chair so that your right / left forearm can rest on a table or on a table or countertop. Your elbow should rest below the height of your shoulder so that your shoulder feels supported and not tense or uncomfortable.  Keeping your right / left shoulder relaxed, lean forward at your waist, allowing your hand to slide forward. Bend forward until you feel a moderate stretch in your shoulder, but before you feel an increase in your pain.  Hold __________ seconds. Slowly return to your starting position. Repeat __________ times. Complete this exercise __________ times per day.  STRETCH - Flexion, Standing  Stand with good posture. With an underhand grip on your right / left hand and an overhand grip on the opposite hand, grasp a broomstick or cane so that your hands are a little more than shoulder-width  apart.  Keeping your right / left elbow straight and shoulder muscles relaxed, push the stick with your opposite hand to raise your arm in front of your body and then overhead. Raise your arm until you feel a stretch in your right / left shoulder, but before you have increased shoulder pain.  Try to avoid shrugging your shoulder as your arm rises by keeping your shoulder blade tucked down and toward your mid-back spine. Hold __________ seconds.  Slowly return to the starting position. Repeat __________ times. Complete this exercise __________ times per day.  STRETCH - Abduction, Supine  Stand with good posture. With an underhand grip on your right / left hand and an overhand grip on the opposite hand, grasp a broomstick or cane so that your hands are a little more than shoulder-width apart.  Keeping your right / left elbow straight and shoulder muscles relaxed, push the stick with your opposite hand to raise your right / left arm out to the side of your body and then overhead. Raise your arm until you feel a stretch in your shoulder, but before you have increased shoulder pain.  Try to avoid shrugging your shoulder as your arm rises by keeping your shoulder blade tucked down and toward your mid-back spine. Hold __________ seconds.  Slowly return to the starting position. Repeat __________ times. Complete this exercise __________ times per day.  ROM - Flexion, Active-Assisted  Lie on your back. You may bend your knees for comfort.  Grasp a broomstick or cane so your hands are about shoulder-width apart. Your right / left hand should grip the end of the stick/cane so that your hand is positioned "thumbs-up," as if you were about to shake hands.  Using your healthy arm to lead, raise your right / left arm overhead until you feel a gentle stretch in your shoulder. Hold __________ seconds.  Use the stick/cane to assist in returning your arm to its starting position. Repeat __________ times.  Complete this exercise __________ times per day.  STRETCH - Flexion, Standing  Stand facing a wall. Walk your right / left fingers up the wall until you feel a moderate stretch in your shoulder. As your hand gets higher, you may need to step closer to the wall or use a door frame to walk through.  Try to avoid shrugging your shoulder as your arm rises by keeping your shoulder blade tucked down and toward your mid-back spine.  Hold __________ seconds. Use your other hand, if needed, to ease out of the stretch and return to the starting position. Repeat __________ times. Complete this exercise __________ times per day.  STRETCH - External Rotation  Tuck a folded towel or small ball under your right / left upper arm. Grasp a broomstick or cane with an underhand grasp a little more than shoulder width apart. Bend your elbows to 90 degrees.  Stand with good posture or sit in a chair without arms.  Use your strong arm to push the stick across your body. Do not allow the towel or ball to fall. This will rotate your right / left arm away from your abdomen. Using the stick turn/rotate your hand and forearm away from your body. Hold __________ seconds. Repeat __________ times. Complete this exercise __________ times per day.  STRENGTHENING EXERCISES - Shoulder Instability, Anterior These exercises may help you restore your shoulder strength once your physician has discontinued your 2-6 weeks of immobilization. When completing these exercises, do not raise your arm above shoulder-height until your physician, physical therapist or athletic trainer has instructed you to do so. Advancing your exercise before your provider's approval may result in delayed healing. While completing these exercises, remember:   Muscles can gain both the endurance and the strength needed for everyday activities through controlled exercises.  Complete these exercises as instructed by your physician, physical therapist or athletic  trainer. Progress the resistance and repetitions only as guided.  You may experience muscle soreness or fatigue, but the pain or discomfort you are trying to eliminate should never worsen during these exercises. If this pain does worsen, stop and make certain you are following the directions exactly. If the pain is still present after adjustments, discontinue the exercise until you can discuss the trouble with your clinician.  During your recovery, avoid activity or exercises which involve actions that place your right / left hand or elbow above your head or behind your back or head. These positions stress the tissues which are trying to heal. STRENGTH - Scapular Depression and Adduction   With good posture, sit on a firm chair. Supported your arms in front of you with pillows, arm rests or a table top. Have your elbows in line with the sides of your body.  Gently draw your shoulder blades down and toward your mid-back spine. Gradually increase the tension without tensing the muscles along the top of your shoulders and the back of your neck.  Hold for __________ seconds. Slowly release the tension and relax your muscles completely before completing the next repetition.  After you have practiced this exercise, remove the arm support and complete it in standing as well as sitting. Repeat __________ times. Complete this exercise __________ times per day.  STRENGTH - Shoulder Flexion, Isometric  With good posture and facing a wall, stand or sit about 4-6 inches away.  Keeping your right / left elbow straight, gently press the top of your fist into the wall. Increase the pressure gradually until you are pressing as hard as you can without shrugging your shoulder or increasing any shoulder discomfort.  Hold __________ seconds.  Release the tension slowly. Relax your shoulder muscles completely before you the next repetition. Repeat __________ times. Complete this exercise __________ times per day.   STRENGTH - Shoulder Abductors, Isometric  With good posture, stand or sit about 4-6 inches from a wall with your right / left side facing the wall.  Bend your right / left elbow. Gently press your elbow into the wall. Increase the pressure gradually until you are pressing as hard as you can without shrugging your shoulder or increasing any shoulder discomfort.  Hold __________ seconds.  Release the tension slowly. Relax your shoulder muscles completely before you the next repetition. Repeat __________ times. Complete this exercise __________ times per day.  STRENGTH - Internal Rotators, Isometric  Keep your right / left elbow at your side and bend it 90 degrees.  Step into a door frame so that the inside of your right / left wrist can press against the door frame without your upper arm leaving your side.  Gently press your right / left wrist into the door frame as if you were trying to draw the palm of your hand to your abdomen. Gradually increase the tension until you are pressing as hard  as you can without shrugging your shoulder or increasing any shoulder discomfort.  Hold __________ seconds.  Release the tension slowly. Relax your shoulder muscles completely before you the next repetition. Repeat __________ times. Complete this exercise __________ times per day.  STRENGTH - External Rotators, Isometric  Keep your right / left elbow at your side and bend it 90 degrees.  Step into a door frame so that the outside of your right / left wrist can press against the door frame without your upper arm leaving your side.  Gently press your right / left wrist into the door frame as if you were trying to swing the back of your hand away from your abdomen. Gradually increase the tension until you are pressing as hard as you can without shrugging your shoulder or increasing any shoulder discomfort.  Hold __________ seconds.  Release the tension slowly. Relax your shoulder muscles completely  before you the next repetition. Repeat __________ times. Complete this exercise __________ times per day.  STRENGTH - Scapular Protractors, Standing  Stand arms-length away from a wall. Place your hands on the wall, keeping your elbows straight.  Begin by dropping your shoulder blades down and toward your mid-back spine.  To strengthen your protractors, keep your shoulder blades down, but slide them forward on your rib cage. It will feel as if you are lifting the back of your rib cage away from the wall. This is a subtle motion and can be challenging to complete. Ask your clinician for further instruction if you are not sure you are doing the exercise correctly.  Hold for __________ seconds. Slowly return to the starting position, resting the muscles completely before completing the next repetition. Repeat __________ times. Complete this exercise __________ times per day. STRENGTH - Scapular Protractors, Supine  Lie on your back on a firm surface. Extend your right / left arm straight into the air while holding a __________ weight in your hand.  Keeping your head and back in place, lift your shoulder off the floor.  Hold __________ seconds. Slowly return to the starting position and allow your muscles to relax completely before completing the next repetition. Repeat __________ times. Complete this exercise __________ times per day. STRENGTH - Scapular Protractors, Quadruped  Get onto your hands and knees with your shoulders directly over your hands (or as close as you comfortably can be).  Keeping your elbows locked, lift the back of your rib cage up into your shoulder blades so your mid-back rounds-out. Keep your neck muscles relaxed.  Hold this position for __________ seconds. Slowly return to the starting position and allow your muscles to relax completely before completing the next repetition. Repeat __________ times. Complete this exercise __________ times per day.  STRENGTH -  Shoulder Extensors, Prone  Lie on your stomach on a firm surface so that your right / left arm overhangs the edge. Rest your forehead on your opposite forearm. With your thumb facing away from your body and your elbow straight, hold a __________ weight in your hand.  Squeeze your right / left shoulder blade to your mid-back spine and then slowly raise your arm behind you to the height of the bed.  Hold for __________ seconds. Slowly reverse the directions and return to the starting position, controlling the weight as you lower your arm. Repeat __________ times. Complete this exercise __________ times per day.  STRENGTH - Shoulder Flexion  Stand or sit with good posture. Grasp a __________ weight or an exercise band/tubing so that  your hand is "thumbs-up," like when you shake hands.  Slowly lift your right / left arm as far as you can without increasing any shoulder pain. Initially, many people can only raise their hand to shoulder height.  Avoid shrugging your shoulder as your arm rises by keeping your shoulder blade tucked down and toward your mid-back spine.  Hold for __________ seconds. Control the descent of your hand as you slowly return to your starting position. Repeat __________ times. Complete this exercise __________ times per day.  STRENGTH - Scapular Retractors  Secure a rubber exercise band/tubing so that it is at the height of your shoulders when you are either standing or sitting on a firm arm-less chair.  With a palm-down grip, grasp an end of the band/tubing in each hand. Straighten your elbows and lift your hands straight in front of you at shoulder height. Step back away from the secured end of band/tubing until it becomes tense.  Squeezing your shoulder blades together, draw your elbows back as you bend them. Keep your upper arm lifted away from your body throughout the exercise.  Hold __________ seconds. Slowly ease the tension on the band/tubing as you reverse the  directions and return to the starting position. Repeat __________ times. Complete this exercise __________ times per day. STRENGTH - Internal Rotators  Secure a rubber exercise band/tubing to a fixed object so that it is at the same height as your right / left elbow when you are standing or sitting on a firm surface.  Stand or sit so that the secured exercise band/tubing is at your right / left side.  Bend your elbow 90 degrees. Place a folded towel or small pillow under your right / left arm so that your elbow is a few inches away from your side.  Keeping the tension on the exercise band/tubing, pull it across your body toward your abdomen. Be sure to keep your body steady so that the movement is only coming from your shoulder rotating.  Hold __________ seconds. Release the tension in a controlled manner as you return to the starting position. Repeat __________ times. Complete this exercise __________ times per day.  STRENGTH - External Rotators  Secure a rubber exercise band/tubing to a fixed object so that it is at the same height as your right / left elbow when you are standing or sitting on a firm surface.  Stand or sit so that the secured exercise band/tubing is at your side that is not injured.  Bend your elbow 90 degrees. Place a folded towel or small pillow under your right / left arm so that your elbow is a few inches away from your side.  Keeping the tension on the exercise band/tubing, pull it away from your body, as if pivoting on your elbow. Be sure to keep your body steady so that the movement is only coming from your shoulder rotating.  Hold __________ seconds. Release the tension in a controlled manner as you return to the starting position. Repeat __________ times. Complete this exercise __________ times per day.    This information is not intended to replace advice given to you by your health care provider. Make sure you discuss any questions you have with your health  care provider.   Document Released: 05/16/2005 Document Revised: 02/04/2015 Document Reviewed: 08/28/2008 [employer] Interactive Patient Education [employer].

## 2015-07-09 NOTE — Progress Notes (Addendum)
Subjective:    Patient ID: Eduardo [name], male    DOB: 1946-05-07, 70 y.o.   MRN: [id number]  CC: Follow-up and Left shoulder pain   HPI: Eduardo [name] is a 70 y.o. male presenting for Follow-up and Left shoulder pain  L shoulder pain: bothering him the last two days Sometimes gives out, sometimes hurts when it gives out, sometimes doesn't. Has had prior injuries to the shoulder. Using it a lot recently.   Colon ca screen: last in 4-5 years ago  DM2:  Eye exam at the [place], had cataract surgery then, goes every year  HTN: Gets 170s/90s at home, BP here normal, at heart rehab gets 118/60s, no HA,   CAD: s/p CABG a few months ago, ongoing cardiac rehab. CP about a week ago, seen [facility], likely MSK from new machine at rehab, was there overnight had CP rule out   Depression screen [facility] 2/9 07/09/2015 05/27/2015 01/27/2015  Decreased Interest 0 0 0  Down, Depressed, Hopeless 0 0 0  PHQ - 2 Score 0 0 0  Altered sleeping - 0 -  Tired, decreased energy - 0 -  Change in appetite - 0 -  Feeling bad or failure about yourself  - 0 -  Trouble concentrating - 0 -  Moving slowly or fidgety/restless - 0 -  Suicidal thoughts - 0 -  PHQ-9 Score - 0 -     Relevant past medical, surgical, family and social history reviewed and updated as indicated. Interim medical history since our last visit reviewed. Allergies and medications reviewed and updated.    ROS: Per HPI unless specifically indicated above  History  Smoking status  . Former Smoker -- 0.75 packs/day for 30 years  . Types: Cigarettes  . Start date: 01/02/1961  . Quit date: 05/31/1987  Smokeless tobacco  . Never Used    Past Medical History Patient Active Problem List   Diagnosis Date Noted  . Elevated TSH 05/08/2015  . S/P CABG x 4   . Coronary artery disease involving native coronary artery of native heart with unstable angina pectoris ([place])   . Bilateral carotid artery disease ([place]) 04/28/2015    . Obesity (BMI 30-39.9)   . Unstable angina ([place]) 03/18/2014  . Type 2 diabetes mellitus without complication, without long-term current use of insulin ([place]) 09/21/2013  . COPD (chronic obstructive pulmonary disease) ([place]) 09/21/2013  . PTSD (post-traumatic stress disorder) 09/21/2013  . CAD (coronary artery disease) 04/16/2013  . HTN (hypertension) 04/16/2013  . Hyperlipidemia 04/16/2013        Objective:    BP 138/73 mmHg  Pulse 63  Temp(Src) 97.3 F (36.3 C) (Oral)  Ht 5\' 8"  (1.727 m)  Wt 191 lb 12.8 oz (87 kg)  BMI 29.17 kg/m2  Wt Readings from Last 3 Encounters:  07/09/15 191 lb 12.8 oz (87 kg)  06/08/15 185 lb (83.915 kg)  05/27/15 185 lb 10 oz (84.2 kg)     Gen: NAD, alert, cooperative with exam, NCAT EYES: EOMI, no scleral injection or icterus ENT:  OP without erythema LYMPH: no cervical LAD CV: NRRR, normal S1/S2, no murmur, distal pulses 2+ b/l Resp: CTABL, no wheezes, normal WOB Abd: +BS, soft, NTND. no guarding or organomegaly Ext: No edema, warm, sensation intact to touch and monofilament testing b/l dorsum of feet, no feeling to touch or monofilament soles of feet b/l Neuro: Alert and oriented, strength equal b/l UE and LE, coordination grossly normal MSK: Shoulder: L shoulder nl  to inspection, no point tenderness. Full ROM b/l. At times with raising arm from side to 90 degrees or higher pops. Not every time. Rotator cuff muscles intact.     Assessment & Plan:    Eduardo [name] was seen today for follow-up and left shoulder pain.  Diagnoses and all orders for this visit:  Pain in joint of left shoulder ROM exercises, RICE for next few days, ongoing only for past few days. If not iproving can send to physical therapy. Worse after increased use recently in cardiac rehab.  Need for hepatitis C screening test -     Hepatitis C antibody  Hypothyroidism, unspecified hypothyroidism type Started recently on levothyroxine, due for retest. -     TSH  Essential  hypertension Adequate control today. Continue current meds.  Coronary artery disease involving native coronary artery of native heart with unstable angina pectoris ([place]) Had chest pain 1 week ago, seen at [facility], had CP work up, was negative. Told like muscular after increasing workouts with cardiac rehab. No further chest pain  Hyperlipidemia On zetia, fenofibrate. Intolerant of statins  Pulmonary emphysema, unspecified emphysema type ([place]) Breathing well controlled for now on current meds, continue.  Obesity (BMI 30-39.9) Continue regular exercise and activity, diet changes.  Follow up plan: Return in about 6 months (around 01/06/2016).  Assunta Found, MD [facility] 07/09/2015, 8:14 AM

## 2015-07-10 ENCOUNTER — Encounter (HOSPITAL_COMMUNITY)
Admission: RE | Admit: 2015-07-10 | Discharge: 2015-07-10 | Disposition: A | Discharge status: Home / Self Care | Payer: Medicare Other | Source: Ambulatory Visit | Attending: Cardiovascular Disease | Admitting: Cardiovascular Disease

## 2015-07-10 DIAGNOSIS — Z951 Presence of aortocoronary bypass graft: Secondary | ICD-10-CM | POA: Diagnosis not present

## 2015-07-10 LAB — TSH: TSH: 3.89 u[IU]/mL (ref 0.450–4.500)

## 2015-07-10 LAB — HEPATITIS C ANTIBODY: Hep C Virus Ab: <0.1 s/co ratio (ref 0.0–0.9)

## 2015-07-13 ENCOUNTER — Encounter (HOSPITAL_COMMUNITY)
Admission: RE | Admit: 2015-07-13 | Discharge: 2015-07-13 | Disposition: A | Discharge status: Home / Self Care | Payer: Medicare Other | Source: Ambulatory Visit | Attending: Cardiovascular Disease | Admitting: Cardiovascular Disease

## 2015-07-13 DIAGNOSIS — Z951 Presence of aortocoronary bypass graft: Secondary | ICD-10-CM | POA: Diagnosis not present

## 2015-07-15 ENCOUNTER — Encounter (HOSPITAL_COMMUNITY)
Admission: RE | Admit: 2015-07-15 | Discharge: 2015-07-15 | Disposition: A | Discharge status: Home / Self Care | Payer: Medicare Other | Source: Ambulatory Visit | Attending: Cardiovascular Disease | Admitting: Cardiovascular Disease

## 2015-07-15 DIAGNOSIS — Z951 Presence of aortocoronary bypass graft: Secondary | ICD-10-CM | POA: Diagnosis not present

## 2015-07-17 ENCOUNTER — Encounter (HOSPITAL_COMMUNITY)
Admission: RE | Admit: 2015-07-17 | Discharge: 2015-07-17 | Disposition: A | Discharge status: Home / Self Care | Payer: Medicare Other | Source: Ambulatory Visit | Attending: Cardiovascular Disease | Admitting: Cardiovascular Disease

## 2015-07-17 DIAGNOSIS — Z951 Presence of aortocoronary bypass graft: Secondary | ICD-10-CM | POA: Diagnosis not present

## 2015-07-20 ENCOUNTER — Encounter (HOSPITAL_COMMUNITY)
Admission: RE | Admit: 2015-07-20 | Discharge: 2015-07-20 | Disposition: A | Discharge status: Home / Self Care | Payer: Medicare Other | Source: Ambulatory Visit | Attending: Cardiovascular Disease | Admitting: Cardiovascular Disease

## 2015-07-20 DIAGNOSIS — Z951 Presence of aortocoronary bypass graft: Secondary | ICD-10-CM | POA: Diagnosis not present

## 2015-07-22 ENCOUNTER — Encounter (HOSPITAL_COMMUNITY)
Admission: RE | Admit: 2015-07-22 | Discharge: 2015-07-22 | Disposition: A | Discharge status: Home / Self Care | Payer: Medicare Other | Source: Ambulatory Visit | Attending: Cardiovascular Disease | Admitting: Cardiovascular Disease

## 2015-07-22 DIAGNOSIS — Z951 Presence of aortocoronary bypass graft: Secondary | ICD-10-CM | POA: Diagnosis not present

## 2015-07-22 NOTE — Progress Notes (Signed)
Cardiac Rehabilitation Program Outcomes Report   Orientation:  05/27/15 Graduate Date:  tbd Discharge Date:  tbd # of sessions completed: 18  Cardiologist: Gaynelle Cage MD:  Nyoka Cowden Time:  [id number]  A.  Exercise Program:  Tolerates exercise @ 3.90 METS for 15 minutes  B.  Mental Health:  Good mental attitude  C.  Education/Instruction/Skills  Accurately checks own pulse.  Rest:  72  Exercise:  90 and Knows THR for exercise  Uses Perceived Exertion Scale and/or Dyspnea Scale  D.  Nutrition/Weight Control/Body Composition:  Adherence to prescribed nutrition program: fair    E.  Blood Lipids    Lab Results  Component Value Date   CHOL 122 04/29/2015   HDL 29* 04/29/2015   LDLCALC 36 04/29/2015   TRIG 284* 04/29/2015   CHOLHDL 4.2 04/29/2015    F.  Lifestyle Changes:  Making positive lifestyle changes and Not smoking:  Quit 1989  G.  Symptoms noted with exercise:  Asymptomatic  Report Completed By:  Stevphen Rochester RN   Comments:  This is the patients halfway progress note. Patient is progressing more quickly than expected and doing well in the program.

## 2015-07-24 ENCOUNTER — Encounter (HOSPITAL_COMMUNITY)
Admission: RE | Admit: 2015-07-24 | Discharge: 2015-07-24 | Disposition: A | Discharge status: Home / Self Care | Payer: Medicare Other | Source: Ambulatory Visit | Attending: Cardiovascular Disease | Admitting: Cardiovascular Disease

## 2015-07-24 DIAGNOSIS — Z951 Presence of aortocoronary bypass graft: Secondary | ICD-10-CM | POA: Diagnosis not present

## 2015-07-27 ENCOUNTER — Other Ambulatory Visit: Payer: Self-pay | Admitting: Family Medicine

## 2015-07-27 ENCOUNTER — Encounter (HOSPITAL_COMMUNITY)
Admission: RE | Admit: 2015-07-27 | Discharge: 2015-07-27 | Disposition: A | Discharge status: Home / Self Care | Payer: Medicare Other | Source: Ambulatory Visit | Attending: Cardiovascular Disease | Admitting: Cardiovascular Disease

## 2015-07-27 DIAGNOSIS — Z951 Presence of aortocoronary bypass graft: Secondary | ICD-10-CM | POA: Diagnosis not present

## 2015-07-29 ENCOUNTER — Encounter (HOSPITAL_COMMUNITY): Payer: Medicare Other

## 2015-07-31 ENCOUNTER — Encounter (HOSPITAL_COMMUNITY): Payer: Medicare Other

## 2015-08-03 ENCOUNTER — Encounter (HOSPITAL_COMMUNITY)
Admission: RE | Admit: 2015-08-03 | Discharge: 2015-08-03 | Disposition: A | Discharge status: Home / Self Care | Payer: Medicare Other | Source: Ambulatory Visit | Attending: Cardiovascular Disease | Admitting: Cardiovascular Disease

## 2015-08-03 DIAGNOSIS — Z951 Presence of aortocoronary bypass graft: Secondary | ICD-10-CM | POA: Insufficient documentation

## 2015-08-05 ENCOUNTER — Encounter (HOSPITAL_COMMUNITY)
Admission: RE | Admit: 2015-08-05 | Discharge: 2015-08-05 | Disposition: A | Discharge status: Home / Self Care | Payer: Medicare Other | Source: Ambulatory Visit | Attending: Cardiovascular Disease | Admitting: Cardiovascular Disease

## 2015-08-05 DIAGNOSIS — Z951 Presence of aortocoronary bypass graft: Secondary | ICD-10-CM | POA: Diagnosis not present

## 2015-08-07 ENCOUNTER — Encounter (HOSPITAL_COMMUNITY): Payer: Medicare Other

## 2015-08-09 ENCOUNTER — Encounter (HOSPITAL_COMMUNITY): Payer: Self-pay | Admitting: Emergency Medicine

## 2015-08-09 ENCOUNTER — Emergency Department (HOSPITAL_COMMUNITY): Payer: Medicare Other

## 2015-08-09 ENCOUNTER — Emergency Department (HOSPITAL_COMMUNITY)
Admission: EM | Admit: 2015-08-09 | Discharge: 2015-08-09 | Disposition: A | Discharge status: Home / Self Care | Payer: Medicare Other | Attending: Emergency Medicine | Admitting: Emergency Medicine

## 2015-08-09 DIAGNOSIS — E119 Type 2 diabetes mellitus without complications: Secondary | ICD-10-CM | POA: Diagnosis not present

## 2015-08-09 DIAGNOSIS — R0789 Other chest pain: Secondary | ICD-10-CM | POA: Diagnosis not present

## 2015-08-09 DIAGNOSIS — J449 Chronic obstructive pulmonary disease, unspecified: Secondary | ICD-10-CM | POA: Diagnosis not present

## 2015-08-09 DIAGNOSIS — Z951 Presence of aortocoronary bypass graft: Secondary | ICD-10-CM | POA: Insufficient documentation

## 2015-08-09 DIAGNOSIS — E669 Obesity, unspecified: Secondary | ICD-10-CM | POA: Insufficient documentation

## 2015-08-09 DIAGNOSIS — Z87891 Personal history of nicotine dependence: Secondary | ICD-10-CM | POA: Insufficient documentation

## 2015-08-09 DIAGNOSIS — Z7982 Long term (current) use of aspirin: Secondary | ICD-10-CM | POA: Insufficient documentation

## 2015-08-09 DIAGNOSIS — F4310 Post-traumatic stress disorder, unspecified: Secondary | ICD-10-CM | POA: Insufficient documentation

## 2015-08-09 DIAGNOSIS — R079 Chest pain, unspecified: Secondary | ICD-10-CM | POA: Diagnosis not present

## 2015-08-09 DIAGNOSIS — Z79899 Other long term (current) drug therapy: Secondary | ICD-10-CM | POA: Diagnosis not present

## 2015-08-09 DIAGNOSIS — Z7984 Long term (current) use of oral hypoglycemic drugs: Secondary | ICD-10-CM | POA: Diagnosis not present

## 2015-08-09 DIAGNOSIS — I10 Essential (primary) hypertension: Secondary | ICD-10-CM | POA: Insufficient documentation

## 2015-08-09 DIAGNOSIS — I2510 Atherosclerotic heart disease of native coronary artery without angina pectoris: Secondary | ICD-10-CM | POA: Insufficient documentation

## 2015-08-09 LAB — COMPREHENSIVE METABOLIC PANEL
ALT: 26 U/L (ref 17–63)
AST: 23 U/L (ref 15–41)
Albumin: 4.2 g/dL (ref 3.5–5.0)
Alkaline Phosphatase: 44 U/L (ref 38–126)
Anion gap: 6 (ref 5–15)
BILIRUBIN TOTAL: 0.7 mg/dL (ref 0.3–1.2)
BUN: 19 mg/dL (ref 6–20)
CO2: 28 mmol/L (ref 22–32)
CREATININE: 0.76 mg/dL (ref 0.61–1.24)
Calcium: 8.9 mg/dL (ref 8.9–10.3)
Chloride: 105 mmol/L (ref 101–111)
Glucose, Bld: 136 mg/dL — ABNORMAL HIGH (ref 65–99)
POTASSIUM: 4.5 mmol/L (ref 3.5–5.1)
Sodium: 139 mmol/L (ref 135–145)
TOTAL PROTEIN: 6.9 g/dL (ref 6.5–8.1)

## 2015-08-09 LAB — CBC WITH DIFFERENTIAL/PLATELET
Basophils Absolute: 0.1 10*3/uL (ref 0.0–0.1)
Basophils Relative: 1 %
Eosinophils Absolute: 0.2 10*3/uL (ref 0.0–0.7)
Eosinophils Relative: 4 %
HCT: 42.7 % (ref 39.0–52.0)
HEMOGLOBIN: 14 g/dL (ref 13.0–17.0)
LYMPHS ABS: 1.1 10*3/uL (ref 0.7–4.0)
LYMPHS PCT: 20 %
MCH: 29 pg (ref 26.0–34.0)
MCHC: 32.8 g/dL (ref 30.0–36.0)
MCV: 88.6 fL (ref 78.0–100.0)
Monocytes Absolute: 0.5 10*3/uL (ref 0.1–1.0)
Monocytes Relative: 8 %
NEUTROS PCT: 67 %
Neutro Abs: 3.6 10*3/uL (ref 1.7–7.7)
Platelets: 212 10*3/uL (ref 150–400)
RBC: 4.82 MIL/uL (ref 4.22–5.81)
RDW: 13.6 % (ref 11.5–15.5)
WBC: 5.3 10*3/uL (ref 4.0–10.5)

## 2015-08-09 LAB — TROPONIN I: Troponin I: <0.03 ng/mL (ref <0.031)

## 2015-08-09 LAB — I-STAT TROPONIN, ED: Troponin i, poc: 0 ng/mL (ref 0.00–0.08)

## 2015-08-09 LAB — D-DIMER, QUANTITATIVE: D-Dimer, Quant: 0.63 ug/mL-FEU — ABNORMAL HIGH (ref 0.00–0.50)

## 2015-08-09 MED ORDER — FENTANYL CITRATE (PF) 100 MCG/2ML IJ SOLN
100.0000 ug | Freq: Once | INTRAMUSCULAR | Status: AC
  Administered 2015-08-09: 100 ug via INTRAVENOUS
  Filled 2015-08-09: qty 2

## 2015-08-09 MED ORDER — ASPIRIN 81 MG PO CHEW
324.0000 mg | CHEWABLE_TABLET | Freq: Once | ORAL | Status: AC
  Administered 2015-08-09: 243 mg via ORAL
  Filled 2015-08-09: qty 4

## 2015-08-09 MED ORDER — KETOROLAC TROMETHAMINE 30 MG/ML IJ SOLN
15.0000 mg | Freq: Once | INTRAMUSCULAR | Status: AC
  Administered 2015-08-09: 15 mg via INTRAVENOUS
  Filled 2015-08-09: qty 1

## 2015-08-09 NOTE — ED Notes (Signed)
I stat troponin result 0.00.

## 2015-08-09 NOTE — ED Provider Notes (Signed)
CSN: [id number]     Arrival date & time 08/09/15  [id number] History  By signing my name below, I, Eduardo [name], attest that this documentation has been prepared under the direction and in the presence of Eduardo Pew, MD. Electronically Signed: Jolayne [name], Scribe. 08/09/2015. 8:58 AM.   Chief Complaint  Patient presents with  . Chest Pain   The history is provided by the patient. No language interpreter was used.   HPI Comments: Eduardo [name] is a 70 y.o. male, with hx of heart disease and quadruple bypass performed at [facility] about three months ago, who presents to the Emergency Department complaining of sudden onset, intermittent chest pain, exacerbated by ambulation and deep inspiration, which began when he woke up this morning but is currently better at the moment. He also notes recent increased urination frequency. He has been compliant with his medication. Pt denies recent fever, cough, SOB, leg swelling, abdominal swelling, nausea, vomiting, diaphoresis, recent PNA or fall, and hx of MI or blood clot.   Past Medical History  Diagnosis Date  . COPD (chronic obstructive pulmonary disease) ([name])   . CAD (coronary artery disease)   . Diabetes mellitus without complication ([name])   . Hypertension   . Sleep apnea   . Chemical exposure     agent orange   . PTSD (post-traumatic stress disorder)   . Diverticulitis   . Obesity (BMI 30-39.9)   . Bilateral carotid artery disease ([name]) 04/28/2015    1-39 percent bilateral stenosis noted on Doppler   . Lumbar disc disease   . Cervical disc disease    Past Surgical History  Procedure Laterality Date  . Lumbar laminectomy    . Cardiac catheterization    . Cholecystectomy    . Shoulder surgery Bilateral   . Replacement total knee      right   . Ptca      unsuccesful  . Cervical spine surgery    . I&d extremity Left 05/30/2013    Procedure: IRRIGATION AND DEBRIDEMENT Left Hand and Foreign body removal;  Surgeon:  [name], MD;  Location: [name];  Service: Orthopedics;  Laterality: Left;  [name] Eye surgery Right     cataract  . Cardiac catheterization  03/18/14    difficult to determine culprit vesel  . Left heart catheterization with coronary angiogram N/A 03/18/2014    Procedure: LEFT HEART CATHETERIZATION WITH CORONARY ANGIOGRAM;  Surgeon: [name], MD;  Location[facility] CATH LAB;  Service: Cardiovascular;  Laterality: N/A;  . Partial colectomy      For diverticulitis  . Cardiac catheterization N/A 04/29/2015    Procedure: Left Heart Cath and Coronary Angiography;  Surgeon: [name], MD;  Location: [place] CV LAB;  Service: Cardiovascular;  Laterality: N/A;  . Coronary artery bypass graft N/A 05/04/2015    Procedure: CORONARY ARTERY BYPASS GRAFTING (CABG)  x four,  using left internal mammary artery, and bilateral thigh greater saphenous veins;  Surgeon: [name], MD;  Location: [place];  Service: Open Heart Surgery;  Laterality: N/A;  . Tee without cardioversion N/A 05/04/2015    Procedure: TRANSESOPHAGEAL ECHOCARDIOGRAM (TEE);  Surgeon: [name], MD;  Location: [place];  Service: Open Heart Surgery;  Laterality: N/A;   Family History  Problem Relation Age of Onset  . Diabetes Father   . Heart disease Father   . Hypertension Father   . Diabetes Paternal Grandmother   . Heart disease Paternal Grandmother   .  Hypertension Paternal Grandmother   . Diabetes Paternal Uncle   . Heart disease Paternal Uncle   . Hypertension Paternal Uncle    Social History  Substance Use Topics  . Smoking status: Former Smoker -- 0.75 packs/day for 30 years    Types: Cigarettes    Start date: 01/02/1961    Quit date: 05/31/1987  . Smokeless tobacco: Never Used  . Alcohol Use: No    Review of Systems  Constitutional: Negative for fever and diaphoresis.  Respiratory: Negative for cough and shortness of breath.   Cardiovascular: Positive for chest pain. Negative for leg  swelling.  Gastrointestinal: Negative for nausea, vomiting and abdominal distention.  Genitourinary: Positive for frequency.  All other systems reviewed and are negative.  Allergies  Statins  Home Medications   Prior to Admission medications   Medication Sig Start Date End Date Taking? Authorizing Provider  albuterol (PROVENTIL HFA;VENTOLIN HFA) 108 (90 BASE) MCG/ACT inhaler Inhale 1 puff into the lungs every 6 (six) hours as needed for wheezing or shortness of breath.    Yes Historical Provider, MD  aspirin EC 81 MG EC tablet Take 1 tablet (81 mg total) by mouth daily. 05/10/15  Yes Wayne E Gold, PA-C  benazepril (LOTENSIN) 20 MG tablet Take 1 tablet (20 mg total) by mouth daily. 05/18/15  Yes Herminio Commons, MD  carvedilol (COREG) 6.25 MG tablet TAKE 1 TABLET TWICE A DAY WITH MEALS 07/27/15  Yes Sharion Balloon, FNP  clopidogrel (PLAVIX) 75 MG tablet TAKE 1 TABLET DAILY 02/19/15  Yes Herminio Commons, MD  ezetimibe (ZETIA) 10 MG tablet Take 10 mg by mouth daily.   Yes Historical Provider, MD  fenofibrate micronized (LOFIBRA) 134 MG capsule TAKE 1 CAPSULE DAILY BEFORE BREAKFAST 04/30/15  Yes Chipper Herb, MD  levothyroxine (SYNTHROID, LEVOTHROID) 25 MCG tablet Take 1 tablet (25 mcg total) by mouth daily before breakfast. 05/10/15  Yes Wayne E Gold, PA-C  meloxicam (MOBIC) 15 MG tablet Take 15 mg by mouth daily as needed for pain (prn inflammation).   Yes Historical Provider, MD  metFORMIN (GLUCOPHAGE) 1000 MG tablet Take 1,000 mg by mouth 2 (two) times daily with a meal.   Yes Historical Provider, MD  nitroGLYCERIN (NITROSTAT) 0.4 MG SL tablet Place 0.4 mg under the tongue every 5 (five) minutes as needed for chest pain.   Yes Historical Provider, MD  sertraline (ZOLOFT) 100 MG tablet Take 100 mg by mouth daily.   Yes Historical Provider, MD  tiotropium (SPIRIVA) 18 MCG inhalation capsule Place 18 mcg into inhaler and inhale daily.   Yes Historical Provider, MD  traMADol (ULTRAM) 50  MG tablet Take 1 tablet by mouth every 4 (four) hours as needed for moderate pain.  05/10/15  Yes Historical Provider, MD   BP 143/89 mmHg  Pulse 108  Temp(Src) 97.5 F (36.4 C) (Oral)  Resp 17  Ht 5\' 8"  (1.727 m)  Wt 185 lb (83.915 kg)  BMI 28.14 kg/m2  SpO2 94% Physical Exam  Constitutional: He is oriented to person, place, and time. He appears well-developed and well-nourished. No distress.  HENT:  Head: Normocephalic.  Eyes: Conjunctivae are normal.  Cardiovascular: Normal rate, regular rhythm and normal heart sounds.   Pulmonary/Chest: Effort normal and breath sounds normal. No respiratory distress.  Diffuse chest tenderness, right upper peristernal area reproduces symptoms but not to the same intensity.  Well healing midline incision of his chest. Heart exam is normal. No lower extremity edema or tenderness  Abdominal: He  exhibits no distension.  Abdomen benign.   Neurological: He is alert and oriented to person, place, and time.  Skin: Skin is warm and dry.  No rashes  Psychiatric: He has a normal mood and affect.  Nursing note and vitals reviewed.   ED Course  Procedures  DIAGNOSTIC STUDIES:    Oxygen Saturation is 98% on RA, normal by my interpretation.   COORDINATION OF CARE:  8:59 AM Will order imaging and labs. Discussed treatment plan with pt at bedside and pt agreed to plan.   Labs Review Labs Reviewed  COMPREHENSIVE METABOLIC PANEL - Abnormal; Notable for the following:    Glucose, Bld 136 (*)    All other components within normal limits  D-DIMER, QUANTITATIVE (NOT AT [facility]) - Abnormal; Notable for the following:    D-Dimer, Quant 0.63 (*)    All other components within normal limits  TROPONIN I  CBC WITH DIFFERENTIAL/PLATELET  Randolm Idol, ED   Imaging Review Dg Chest Portable 1 View  08/09/2015  CLINICAL DATA:  70 year old male with chest pain EXAM: PORTABLE CHEST 1 VIEW COMPARISON:  Prior chest x-ray 07/04/2015 FINDINGS: Cardiac and  mediastinal contours remain within normal limits. Patient is status post median sternotomy with evidence of prior multivessel CABG. No evidence of pulmonary edema, pleural effusion or pneumothorax. No focal airspace consolidation. Osseous structures are intact and unremarkable. IMPRESSION: Stable chest x-ray without evidence of acute cardiopulmonary process. Electronically Signed   By: Jacqulynn Cadet M.D.   On: 08/09/2015 09:20   I have personally reviewed and evaluated these images and lab results as part of my medical decision-making.   EKG Interpretation   Date/Time:  Sunday August 09 2015 12:02:08 EDT Ventricular Rate:  58 PR Interval:  224 QRS Duration: 81 QT Interval:  444 QTC Calculation: 436 R Axis:   62 Text Interpretation:  Sinus rhythm Prolonged PR interval No significant  change since earlier in day or previously. Confirmed by [facility] MD, Corene Cornea  [phone]) on 08/09/2015 12:06:53 PM      MDM   Final diagnoses:  Chest pain, unspecified chest pain type   Even with his history of ACS and feeling this is unlikely at this time as the pain seems to be relatively clearly musculoskeletal. However secondary to his history I will check serial EKGs and delta troponins to ensure no significant change between the 2. We'll also treat symptoms with fentanyl and Toradol for now as he started taking nitroglycerin that didn't really help.  Initial troponin negative and EKG without obvious ischemia we'll get another troponin 3 hours and repeat EKG at that time as well.  1052: D/W Dr. Radford Pax with cardiology. Recommended D dimer, second troponin, second ECG. If all ok, outpatient Cards fu appropriate.   -D dimer appropriate for age, doubt PE -delta troponins and ecgs ok, doubt ACS but will fu w/ cardiology -symptoms totally resolved on multiple reevaluations with toradol and fentanyl. No long reproducible either.   Symptoms likely MSK in nature, however will follow closely with pcp and  cardiology.   I have personally and contemperaneously reviewed labs and imaging and used in my decision making as above.   A medical screening exam was performed and I feel the patient has had an appropriate workup for their chief complaint at this time and likelihood of emergent condition existing is low. Their vital signs are stable. They have been counseled on decision, discharge, follow up and which symptoms necessitate immediate return to the emergency department.  They verbally stated  understanding and agreement with plan and discharged in stable condition.   I personally performed the services described in this documentation, which was scribed in my presence. The recorded information has been reviewed and is accurate.      Eduardo Pew, MD 08/09/15 [phone]

## 2015-08-09 NOTE — ED Notes (Signed)
MD at bedside. 

## 2015-08-09 NOTE — ED Notes (Signed)
Patient c/o non-radiating, mid-sternal chest pain. Per patient started this morning. Patient reports taking 1 SL nitro-states "I don't know if it really helped I was dozing." Per patient recent hx of quadruple by-pass surgery. Patient states pain is worse with palpitation. Denies any coughing.

## 2015-08-10 ENCOUNTER — Encounter (HOSPITAL_COMMUNITY)
Admission: RE | Admit: 2015-08-10 | Discharge: 2015-08-10 | Disposition: A | Discharge status: Home / Self Care | Payer: Medicare Other | Source: Ambulatory Visit | Attending: Cardiovascular Disease | Admitting: Cardiovascular Disease

## 2015-08-10 DIAGNOSIS — Z951 Presence of aortocoronary bypass graft: Secondary | ICD-10-CM | POA: Diagnosis not present

## 2015-08-12 ENCOUNTER — Encounter (HOSPITAL_COMMUNITY): Payer: Medicare Other

## 2015-08-14 ENCOUNTER — Encounter (HOSPITAL_COMMUNITY)
Admission: RE | Admit: 2015-08-14 | Discharge: 2015-08-14 | Disposition: A | Discharge status: Home / Self Care | Payer: Medicare Other | Source: Ambulatory Visit | Attending: Cardiovascular Disease | Admitting: Cardiovascular Disease

## 2015-08-14 DIAGNOSIS — Z951 Presence of aortocoronary bypass graft: Secondary | ICD-10-CM | POA: Diagnosis not present

## 2015-08-17 ENCOUNTER — Encounter (HOSPITAL_COMMUNITY)
Admission: RE | Admit: 2015-08-17 | Discharge: 2015-08-17 | Disposition: A | Discharge status: Home / Self Care | Payer: Medicare Other | Source: Ambulatory Visit | Attending: Cardiovascular Disease | Admitting: Cardiovascular Disease

## 2015-08-17 ENCOUNTER — Ambulatory Visit (INDEPENDENT_AMBULATORY_CARE_PROVIDER_SITE_OTHER): Payer: Medicare Other | Admitting: Cardiovascular Disease

## 2015-08-17 ENCOUNTER — Encounter: Payer: Self-pay | Admitting: Cardiovascular Disease

## 2015-08-17 VITALS — BP 128/68 | HR 65 | Ht 68.0 in | Wt 193.8 lb

## 2015-08-17 DIAGNOSIS — Z951 Presence of aortocoronary bypass graft: Secondary | ICD-10-CM

## 2015-08-17 DIAGNOSIS — I25768 Atherosclerosis of bypass graft of coronary artery of transplanted heart with other forms of angina pectoris: Secondary | ICD-10-CM

## 2015-08-17 DIAGNOSIS — I712 Thoracic aortic aneurysm, without rupture, unspecified: Secondary | ICD-10-CM

## 2015-08-17 DIAGNOSIS — I6523 Occlusion and stenosis of bilateral carotid arteries: Secondary | ICD-10-CM | POA: Diagnosis not present

## 2015-08-17 DIAGNOSIS — I10 Essential (primary) hypertension: Secondary | ICD-10-CM | POA: Diagnosis not present

## 2015-08-17 DIAGNOSIS — E785 Hyperlipidemia, unspecified: Secondary | ICD-10-CM

## 2015-08-17 DIAGNOSIS — Z789 Other specified health status: Secondary | ICD-10-CM

## 2015-08-17 DIAGNOSIS — Z889 Allergy status to unspecified drugs, medicaments and biological substances status: Secondary | ICD-10-CM

## 2015-08-17 NOTE — Progress Notes (Signed)
Patient ID: Eduardo [name], male   DOB: 04-Nov-1945, 70 y.o.   MRN: [id number]      SUBJECTIVE: The patient presents for follow-up of coronary artery disease and CABG. He was recently evaluated in the ED for what appeared to be musculoskeletal chest wall pain. He has been doing well and denies exertional chest pain, shortness of breath, palpitations, and leg swelling. He is no longer on amiodarone. He has golfed 9 holes and walked without any chest discomfort. He plans to golf 18 holes today for the first time since undergoing bypass surgery.   Review of Systems: As per "subjective", otherwise negative.  Allergies  Allergen Reactions  . Statins Other (See Comments)    Body aches    Current Outpatient Prescriptions  Medication Sig Dispense Refill  . albuterol (PROVENTIL HFA;VENTOLIN HFA) 108 (90 BASE) MCG/ACT inhaler Inhale 1 puff into the lungs every 6 (six) hours as needed for wheezing or shortness of breath.     [name] aspirin EC 81 MG EC tablet Take 1 tablet (81 mg total) by mouth daily.    . benazepril (LOTENSIN) 20 MG tablet Take 1 tablet (20 mg total) by mouth daily. 90 tablet 3  . carvedilol (COREG) 6.25 MG tablet TAKE 1 TABLET TWICE A DAY WITH MEALS 180 tablet 0  . clopidogrel (PLAVIX) 75 MG tablet TAKE 1 TABLET DAILY 90 tablet 2  . ezetimibe (ZETIA) 10 MG tablet Take 10 mg by mouth daily.    . fenofibrate micronized (LOFIBRA) 134 MG capsule TAKE 1 CAPSULE DAILY BEFORE BREAKFAST 90 capsule 1  . levothyroxine (SYNTHROID, LEVOTHROID) 25 MCG tablet Take 1 tablet (25 mcg total) by mouth daily before breakfast. 30 tablet 1  . meloxicam (MOBIC) 15 MG tablet Take 15 mg by mouth daily as needed for pain (prn inflammation).    . metFORMIN (GLUCOPHAGE) 1000 MG tablet Take 1,000 mg by mouth 2 (two) times daily with a meal.    . nitroGLYCERIN (NITROSTAT) 0.4 MG SL tablet Place 0.4 mg under the tongue every 5 (five) minutes as needed for chest pain.    [name] sertraline (ZOLOFT) 100 MG tablet  Take 100 mg by mouth daily.    [name] tiotropium (SPIRIVA) 18 MCG inhalation capsule Place 18 mcg into inhaler and inhale daily.    . traMADol (ULTRAM) 50 MG tablet Take 1 tablet by mouth every 4 (four) hours as needed for moderate pain.      No current facility-administered medications for this visit.    Past Medical History  Diagnosis Date  . COPD (chronic obstructive pulmonary disease) ([place])   . CAD (coronary artery disease)   . Diabetes mellitus without complication ([place])   . Hypertension   . Sleep apnea   . Chemical exposure     agent orange   . PTSD (post-traumatic stress disorder)   . Diverticulitis   . Obesity (BMI 30-39.9)   . Bilateral carotid artery disease ([place]) 04/28/2015    1-39 percent bilateral stenosis noted on Doppler   . Lumbar disc disease   . Cervical disc disease     Past Surgical History  Procedure Laterality Date  . Lumbar laminectomy    . Cardiac catheterization    . Cholecystectomy    . Shoulder surgery Bilateral   . Replacement total knee      right   . Ptca      unsuccesful  . Cervical spine surgery    . I&d extremity Left 05/30/2013    Procedure: IRRIGATION AND DEBRIDEMENT  Left Hand and Foreign body removal;  Surgeon: [name], MD;  Location: [place];  Service: Orthopedics;  Laterality: Left;  [name] Eye surgery Right     cataract  . Cardiac catheterization  03/18/14    difficult to determine culprit vesel  . Left heart catheterization with coronary angiogram N/A 03/18/2014    Procedure: LEFT HEART CATHETERIZATION WITH CORONARY ANGIOGRAM;  Surgeon: [name], MD;  Location: [facility] CATH LAB;  Service: Cardiovascular;  Laterality: N/A;  . Partial colectomy      For diverticulitis  . Cardiac catheterization N/A 04/29/2015    Procedure: Left Heart Cath and Coronary Angiography;  Surgeon: [name], MD;  Location: [place] CV LAB;  Service: Cardiovascular;  Laterality: N/A;  . Coronary artery bypass graft N/A 05/04/2015    Procedure:  CORONARY ARTERY BYPASS GRAFTING (CABG)  x four,  using left internal mammary artery, and bilateral thigh greater saphenous veins;  Surgeon: [name], MD;  Location: [place];  Service: Open Heart Surgery;  Laterality: N/A;  . Tee without cardioversion N/A 05/04/2015    Procedure: TRANSESOPHAGEAL ECHOCARDIOGRAM (TEE);  Surgeon: [name], MD;  Location: [place];  Service: Open Heart Surgery;  Laterality: N/A;    Social History   Social History  . Marital Status: Married    Spouse Name: N/A  . Number of Children: N/A  . Years of Education: N/A   Occupational History  . Not on file.   Social History Main Topics  . Smoking status: Former Smoker -- 0.75 packs/day for 30 years    Types: Cigarettes    Start date: 01/02/1961    Quit date: 05/31/1987  . Smokeless tobacco: Never Used  . Alcohol Use: No  . Drug Use: No  . Sexual Activity: Not on file   Other Topics Concern  . Not on file   Social History Narrative     Filed Vitals:   08/17/15 0846  BP: 128/68  Pulse: 65  Height: 5\' 8"  (1.727 m)  Weight: 193 lb 12.8 oz (87.907 kg)  SpO2: 97%    PHYSICAL EXAM General: NAD HEENT: Normal. Neck: No JVD, no thyromegaly. Lungs: Clear to auscultation bilaterally with normal respiratory effort. CV: Nondisplaced PMI.  Regular rate and rhythm, normal S1/S2, no S3/S4, no murmur. No pretibial or periankle edema.    Abdomen: Soft, nontender, no distention.  Neurologic: Alert and oriented.  Psych: Normal affect. Skin: Normal. Musculoskeletal: No gross deformities.  ECG: Most recent ECG reviewed.      ASSESSMENT AND PLAN: 1. CAD s/p 4-v CABG: Stable ischemic heart disease. Continue aspirin 81 mg, Plavix 75 mg, ACEI, and Coreg 6.25 mg twice daily.   2. Essential HTN: Controlled. No changes.  3. Hyperlipidemia: Intolerant to statin therapy. 04/29/15 HDL 29, LDL 36, TC 122, TG 284. Continue Zetia 10 mg.  4. Thoracic aortic aneurysm: Followed by [facility].  5. Carotid artery  stenosis: Carotid Dopplers in October 2015 demonstrated 1-39% bilateral carotid artery stenosis. Continue aspirin and Plavix. Intolerant to statins.  6. Paroxysmal atrial fibrillation: No further episodes. No longer on amiodarone.  Dispo: f/u 6 months.   [name], M.D., F.A.C.C.

## 2015-08-17 NOTE — Patient Instructions (Signed)
Your physician recommends that you continue on your current medications as directed. Please refer to the Current Medication list given to you today. Your physician recommends that you schedule a follow-up appointment in: 6 months. You will receive a reminder letter in the mail in about 4 months reminding you to call and schedule your appointment. If you don't receive this letter, please contact our office. 

## 2015-08-19 ENCOUNTER — Encounter (HOSPITAL_COMMUNITY)
Admission: RE | Admit: 2015-08-19 | Discharge: 2015-08-19 | Disposition: A | Discharge status: Home / Self Care | Payer: Medicare Other | Source: Ambulatory Visit | Attending: Cardiovascular Disease | Admitting: Cardiovascular Disease

## 2015-08-19 DIAGNOSIS — Z951 Presence of aortocoronary bypass graft: Secondary | ICD-10-CM | POA: Diagnosis not present

## 2015-08-21 ENCOUNTER — Encounter (HOSPITAL_COMMUNITY): Payer: Medicare Other

## 2015-08-24 ENCOUNTER — Encounter (HOSPITAL_COMMUNITY): Payer: Medicare Other

## 2015-08-28 NOTE — Progress Notes (Deleted)
Patient is discharged from [place] and Pulmonary program today, 08/19/15 with 29 sessions.  He achieved LTG of 30 minutes of aerobic exercise at max met level of 5.30. Patient has not met with dietician.  Discharge instructions have been reviewed in detail and patient expressed an understanding of material given.  Patient plans to exercise at home. Cardiac Rehab will make 1 month, 6 month and 1 year call backs.  Patient had no complaints of any abnormal S/S or pain on their exit visit.

## 2015-08-28 NOTE — Addendum Note (Signed)
Encounter addended by: Cathie Olden, RN on: 08/28/2015  2:57 PM<BR>     Documentation filed: Notes Section

## 2015-08-28 NOTE — Progress Notes (Signed)
Cardiac Rehabilitation Program Outcomes Report   Orientation:  05/27/15 Graduate Date:  08/19/15 Discharge Date:  08/19/15 # of sessions completed: 29  Cardiologist: Bronson Ing Family MD:  [facility] Class Time:  [id number]  A.  Exercise Program:  Tolerates exercise @ 5.30 METS for 15 minutes, Walk Test Results:  Post: patient did not complete post walk test  and Improved functional capacity  37.9 %  B.  Mental Health:  Good mental attitude, Quality of Life (QOL)  improvements:  Overall  1.92 %, Health/Functioning 7.53 %, Socioeconomics -5.95 %, Psych/Spiritual 0 %, Family 0 %   and PHQ-9: 0  C.  Education/Instruction/Skills  Accurately checks own pulse.  Rest:  72  Exercise:  98, Knows THR for exercise and Uses Perceived Exertion Scale and/or Dyspnea Scale  Attended 13 education classes  D.  Nutrition/Weight Control/Body Composition:  Adherence to prescribed nutrition program: fair  and Patient has gained 2.9 kg   E.  Blood Lipids    Lab Results  Component Value Date   CHOL 122 04/29/2015   HDL 29* 04/29/2015   LDLCALC 36 04/29/2015   TRIG 284* 04/29/2015   CHOLHDL 4.2 04/29/2015    F.  Lifestyle Changes:  Making positive lifestyle changes and Not smoking:  Quit 1989  G.  Symptoms noted with exercise:  Asymptomatic  Report Completed By:  Stevphen Rochester RN   Comments:  This is the patients graduation note for [facility].  Patient has progressed well in the program.  Patient plans to exercise at home.

## 2015-08-28 NOTE — Progress Notes (Signed)
Patient is discharged from [facility] program today, 08/19/15 with 29 sessions.  He achieved LTG of 30 minutes of aerobic exercise at max met level of 5.30.  Patient has not met with dietician.  Discharge instructions have been reviewed in detail and patient expressed an understanding of material given.  Patient plans to exercise at home. Cardiac Rehab will make 1 month, 6 month and 1 year call backs.  Patient had no complaints of any abnormal S/S or pain on their exit visit.

## 2015-09-26 ENCOUNTER — Encounter (HOSPITAL_COMMUNITY): Payer: Self-pay | Admitting: Emergency Medicine

## 2015-09-26 ENCOUNTER — Emergency Department (HOSPITAL_COMMUNITY): Payer: Medicare Other

## 2015-09-26 ENCOUNTER — Observation Stay (HOSPITAL_COMMUNITY)
Admission: EM | Admit: 2015-09-26 | Discharge: 2015-09-27 | Disposition: A | Discharge status: Home / Self Care | Payer: Medicare Other | Attending: Internal Medicine | Admitting: Internal Medicine

## 2015-09-26 DIAGNOSIS — I25110 Atherosclerotic heart disease of native coronary artery with unstable angina pectoris: Secondary | ICD-10-CM

## 2015-09-26 DIAGNOSIS — I10 Essential (primary) hypertension: Secondary | ICD-10-CM | POA: Insufficient documentation

## 2015-09-26 DIAGNOSIS — Z7984 Long term (current) use of oral hypoglycemic drugs: Secondary | ICD-10-CM | POA: Insufficient documentation

## 2015-09-26 DIAGNOSIS — I25810 Atherosclerosis of coronary artery bypass graft(s) without angina pectoris: Secondary | ICD-10-CM | POA: Insufficient documentation

## 2015-09-26 DIAGNOSIS — J449 Chronic obstructive pulmonary disease, unspecified: Secondary | ICD-10-CM | POA: Diagnosis not present

## 2015-09-26 DIAGNOSIS — I2510 Atherosclerotic heart disease of native coronary artery without angina pectoris: Secondary | ICD-10-CM | POA: Diagnosis present

## 2015-09-26 DIAGNOSIS — Z951 Presence of aortocoronary bypass graft: Secondary | ICD-10-CM | POA: Diagnosis not present

## 2015-09-26 DIAGNOSIS — E669 Obesity, unspecified: Secondary | ICD-10-CM | POA: Insufficient documentation

## 2015-09-26 DIAGNOSIS — Z6828 Body mass index (BMI) 28.0-28.9, adult: Secondary | ICD-10-CM | POA: Insufficient documentation

## 2015-09-26 DIAGNOSIS — Z79899 Other long term (current) drug therapy: Secondary | ICD-10-CM | POA: Diagnosis not present

## 2015-09-26 DIAGNOSIS — F4310 Post-traumatic stress disorder, unspecified: Secondary | ICD-10-CM | POA: Insufficient documentation

## 2015-09-26 DIAGNOSIS — Z7951 Long term (current) use of inhaled steroids: Secondary | ICD-10-CM | POA: Insufficient documentation

## 2015-09-26 DIAGNOSIS — Z96651 Presence of right artificial knee joint: Secondary | ICD-10-CM | POA: Insufficient documentation

## 2015-09-26 DIAGNOSIS — R079 Chest pain, unspecified: Principal | ICD-10-CM | POA: Diagnosis present

## 2015-09-26 DIAGNOSIS — E119 Type 2 diabetes mellitus without complications: Secondary | ICD-10-CM | POA: Diagnosis not present

## 2015-09-26 DIAGNOSIS — Z791 Long term (current) use of non-steroidal anti-inflammatories (NSAID): Secondary | ICD-10-CM | POA: Diagnosis not present

## 2015-09-26 DIAGNOSIS — R0789 Other chest pain: Secondary | ICD-10-CM

## 2015-09-26 DIAGNOSIS — Z87891 Personal history of nicotine dependence: Secondary | ICD-10-CM | POA: Insufficient documentation

## 2015-09-26 DIAGNOSIS — Z7902 Long term (current) use of antithrombotics/antiplatelets: Secondary | ICD-10-CM | POA: Insufficient documentation

## 2015-09-26 DIAGNOSIS — Z7982 Long term (current) use of aspirin: Secondary | ICD-10-CM | POA: Diagnosis not present

## 2015-09-26 DIAGNOSIS — R072 Precordial pain: Secondary | ICD-10-CM | POA: Diagnosis not present

## 2015-09-26 LAB — COMPREHENSIVE METABOLIC PANEL
ALBUMIN: 4.3 g/dL (ref 3.5–5.0)
ALK PHOS: 46 U/L (ref 38–126)
ALT: 20 U/L (ref 17–63)
AST: 22 U/L (ref 15–41)
Anion gap: 8 (ref 5–15)
BILIRUBIN TOTAL: 0.5 mg/dL (ref 0.3–1.2)
BUN: 20 mg/dL (ref 6–20)
CALCIUM: 8.9 mg/dL (ref 8.9–10.3)
CO2: 26 mmol/L (ref 22–32)
CREATININE: 0.92 mg/dL (ref 0.61–1.24)
Chloride: 107 mmol/L (ref 101–111)
GFR calc Af Amer: >60 mL/min (ref >60)
GLUCOSE: 141 mg/dL — AB (ref 65–99)
POTASSIUM: 4.2 mmol/L (ref 3.5–5.1)
Sodium: 141 mmol/L (ref 135–145)
TOTAL PROTEIN: 7 g/dL (ref 6.5–8.1)

## 2015-09-26 LAB — I-STAT CHEM 8, ED
BUN: 20 mg/dL (ref 6–20)
CHLORIDE: 103 mmol/L (ref 101–111)
CREATININE: 1 mg/dL (ref 0.61–1.24)
Calcium, Ion: 1.2 mmol/L (ref 1.13–1.30)
Glucose, Bld: 128 mg/dL — ABNORMAL HIGH (ref 65–99)
HEMATOCRIT: 45 % (ref 39.0–52.0)
Hemoglobin: 15.3 g/dL (ref 13.0–17.0)
Potassium: 4.1 mmol/L (ref 3.5–5.1)
SODIUM: 143 mmol/L (ref 135–145)
TCO2: 24 mmol/L (ref 0–100)

## 2015-09-26 LAB — I-STAT TROPONIN, ED: Troponin i, poc: 0 ng/mL (ref 0.00–0.08)

## 2015-09-26 LAB — CBC
HCT: 42.7 % (ref 39.0–52.0)
Hemoglobin: 14.2 g/dL (ref 13.0–17.0)
MCH: 29.2 pg (ref 26.0–34.0)
MCHC: 33.3 g/dL (ref 30.0–36.0)
MCV: 87.9 fL (ref 78.0–100.0)
Platelets: 215 10*3/uL (ref 150–400)
RBC: 4.86 MIL/uL (ref 4.22–5.81)
RDW: 14.7 % (ref 11.5–15.5)
WBC: 5 10*3/uL (ref 4.0–10.5)

## 2015-09-26 LAB — PROTIME-INR
INR: 1.02 (ref 0.00–1.49)
PROTHROMBIN TIME: 13.6 s (ref 11.6–15.2)

## 2015-09-26 LAB — APTT: APTT: 28 s (ref 24–37)

## 2015-09-26 LAB — TROPONIN I
Troponin I: <0.03 ng/mL (ref <0.031)
Troponin I: <0.03 ng/mL (ref <0.031)

## 2015-09-26 MED ORDER — ALBUTEROL SULFATE (2.5 MG/3ML) 0.083% IN NEBU: 3.0000 mL | INHALATION_SOLUTION | Freq: Four times a day (QID) | RESPIRATORY_TRACT | Status: DC | PRN

## 2015-09-26 MED ORDER — TIOTROPIUM BROMIDE MONOHYDRATE 18 MCG IN CAPS
18.0000 ug | ORAL_CAPSULE | Freq: Every day | RESPIRATORY_TRACT | Status: DC
  Administered 2015-09-27: 18 ug via RESPIRATORY_TRACT
  Filled 2015-09-26: qty 5

## 2015-09-26 MED ORDER — CLOPIDOGREL BISULFATE 75 MG PO TABS
75.0000 mg | ORAL_TABLET | Freq: Every day | ORAL | Status: DC
  Filled 2015-09-26: qty 1

## 2015-09-26 MED ORDER — CARVEDILOL 3.125 MG PO TABS
6.2500 mg | ORAL_TABLET | Freq: Two times a day (BID) | ORAL | Status: DC
  Administered 2015-09-26 – 2015-09-27 (×2): 6.25 mg via ORAL
  Filled 2015-09-26 (×2): qty 2

## 2015-09-26 MED ORDER — NITROGLYCERIN 0.4 MG SL SUBL
0.4000 mg | SUBLINGUAL_TABLET | SUBLINGUAL | Status: DC | PRN
Start: 2015-09-26 — End: 2015-09-27

## 2015-09-26 MED ORDER — ASPIRIN 81 MG PO CHEW
243.0000 mg | CHEWABLE_TABLET | Freq: Once | ORAL | Status: AC
Start: 2015-09-26 — End: 2015-09-26
  Administered 2015-09-26: 243 mg via ORAL

## 2015-09-26 MED ORDER — ASPIRIN 81 MG PO CHEW
324.0000 mg | CHEWABLE_TABLET | Freq: Once | ORAL | Status: DC
  Filled 2015-09-26: qty 4

## 2015-09-26 MED ORDER — ONDANSETRON HCL 4 MG/2ML IJ SOLN
4.0000 mg | Freq: Four times a day (QID) | INTRAMUSCULAR | Status: DC | PRN
Start: 2015-09-26 — End: 2015-09-27

## 2015-09-26 MED ORDER — NITROGLYCERIN 0.4 MG SL SUBL
0.4000 mg | SUBLINGUAL_TABLET | SUBLINGUAL | Status: DC | PRN
  Administered 2015-09-26: 0.4 mg via SUBLINGUAL
  Filled 2015-09-26: qty 1

## 2015-09-26 MED ORDER — ASPIRIN EC 81 MG PO TBEC
81.0000 mg | DELAYED_RELEASE_TABLET | Freq: Every day | ORAL | Status: DC
  Filled 2015-09-26: qty 1

## 2015-09-26 MED ORDER — TRAMADOL HCL 50 MG PO TABS: 50.0000 mg | ORAL_TABLET | ORAL | Status: DC | PRN

## 2015-09-26 MED ORDER — FENOFIBRATE 160 MG PO TABS
160.0000 mg | ORAL_TABLET | Freq: Every day | ORAL | Status: DC
  Filled 2015-09-26: qty 1

## 2015-09-26 MED ORDER — METFORMIN HCL 500 MG PO TABS: 1000.0000 mg | ORAL_TABLET | Freq: Two times a day (BID) | ORAL | Status: DC

## 2015-09-26 MED ORDER — ENOXAPARIN SODIUM 40 MG/0.4ML ~~LOC~~ SOLN
40.0000 mg | SUBCUTANEOUS | Status: DC
Start: 2015-09-26 — End: 2015-09-27
  Administered 2015-09-26: 40 mg via SUBCUTANEOUS
  Filled 2015-09-26: qty 0.4

## 2015-09-26 MED ORDER — SERTRALINE HCL 50 MG PO TABS
100.0000 mg | ORAL_TABLET | Freq: Every day | ORAL | Status: DC
Start: 2015-09-27 — End: 2015-09-27
  Administered 2015-09-27: 100 mg via ORAL
  Filled 2015-09-26: qty 2

## 2015-09-26 MED ORDER — METFORMIN HCL 500 MG PO TABS
1000.0000 mg | ORAL_TABLET | Freq: Two times a day (BID) | ORAL | Status: DC
  Administered 2015-09-26 – 2015-09-27 (×2): 1000 mg via ORAL
  Filled 2015-09-26 (×2): qty 2

## 2015-09-26 MED ORDER — LEVOTHYROXINE SODIUM 25 MCG PO TABS
25.0000 ug | ORAL_TABLET | Freq: Every day | ORAL | Status: DC
  Administered 2015-09-27: 25 ug via ORAL
  Filled 2015-09-26: qty 1

## 2015-09-26 MED ORDER — ACETAMINOPHEN 325 MG PO TABS: 650.0000 mg | ORAL_TABLET | ORAL | Status: DC | PRN

## 2015-09-26 MED ORDER — MORPHINE SULFATE (PF) 2 MG/ML IV SOLN: 2.0000 mg | INTRAVENOUS | Status: DC | PRN

## 2015-09-26 MED ORDER — EZETIMIBE 10 MG PO TABS
10.0000 mg | ORAL_TABLET | Freq: Every day | ORAL | Status: DC
  Filled 2015-09-26: qty 1

## 2015-09-26 MED ORDER — ZOLPIDEM TARTRATE 5 MG PO TABS: 5.0000 mg | ORAL_TABLET | Freq: Every evening | ORAL | Status: DC | PRN

## 2015-09-26 MED ORDER — MELOXICAM 7.5 MG PO TABS
15.0000 mg | ORAL_TABLET | Freq: Every day | ORAL | Status: DC | PRN
  Filled 2015-09-26: qty 1

## 2015-09-26 MED ORDER — ONDANSETRON HCL 4 MG/2ML IJ SOLN: 4.0000 mg | Freq: Three times a day (TID) | INTRAMUSCULAR | Status: AC | PRN

## 2015-09-26 MED ORDER — GI COCKTAIL ~~LOC~~: 30.0000 mL | Freq: Four times a day (QID) | ORAL | Status: DC | PRN

## 2015-09-26 MED ORDER — BENAZEPRIL HCL 10 MG PO TABS
20.0000 mg | ORAL_TABLET | Freq: Every day | ORAL | Status: DC
  Filled 2015-09-26: qty 2

## 2015-09-26 NOTE — H&P (Signed)
History and Physical  Eduardo [name] [id number] DOB: April 11, 1946 DOA: 09/26/2015  Referring physician: Dr Sabra Heck, ED physician PCP: [name], MD  Outpatient Specialists:   Dr [name] (CT surgery)  Dr Martinique (Cardiology)   Chief Complaint: Chest Pain  HPI: Eduardo [name] is a 70 y.o. male with a history of type 2 diabetes, PTSD, COPD, coronary artery disease status post CABG 4 vessels and December 2016, obesity.  Patient seen for onset of chest pain earlier today. Patient had 3 episodes of substernal sharp pain with no radiation. The episodes of chest pain lasted approximately 5 seconds and progressively became worse. These episodes occurred without activity. No palliating or provoking factors. The last episode was quite severe. No diaphoresis, shortness of breath, nausea with the chest pain  Patient has a poor cardiac history with multiple family members with heart disease, including early MI.  Review of Systems:    Pt denies any fevers, chills, nausea, vomiting, diarrhea, constipation, abdominal pain, shortness of breath, dyspnea on exertion, orthopnea, cough, wheezing, palpitations, headache, vision changes, lightheadedness, dizziness, melena, rectal bleeding.  Review of systems are otherwise negative  Past Medical History  Diagnosis Date  . COPD (chronic obstructive pulmonary disease) ([place])   . CAD (coronary artery disease)   . Diabetes mellitus without complication ([place])   . Hypertension   . Sleep apnea   . Chemical exposure     agent orange   . PTSD (post-traumatic stress disorder)   . Diverticulitis   . Obesity (BMI 30-39.9)   . Bilateral carotid artery disease ([place]) 04/28/2015    1-39 percent bilateral stenosis noted on Doppler   . Lumbar disc disease   . Cervical disc disease    Past Surgical History  Procedure Laterality Date  . Lumbar laminectomy    . Cardiac catheterization    . Cholecystectomy    . Shoulder surgery Bilateral   .  Replacement total knee      right   . Ptca      unsuccesful  . Cervical spine surgery    . I&d extremity Left 05/30/2013    Procedure: IRRIGATION AND DEBRIDEMENT Left Hand and Foreign body removal;  Surgeon: [name], MD;  Location: [facility];  Service: Orthopedics;  Laterality: Left;  [name] Eye surgery Right     cataract  . Cardiac catheterization  03/18/14    difficult to determine culprit vesel  . Left heart catheterization with coronary angiogram N/A 03/18/2014    Procedure: LEFT HEART CATHETERIZATION WITH CORONARY ANGIOGRAM;  Surgeon: [name], MD;  Location: [facility] CATH LAB;  Service: Cardiovascular;  Laterality: N/A;  . Partial colectomy      For diverticulitis  . Cardiac catheterization N/A 04/29/2015    Procedure: Left Heart Cath and Coronary Angiography;  Surgeon: [name], MD;  Location: [facility] CV LAB;  Service: Cardiovascular;  Laterality: N/A;  . Coronary artery bypass graft N/A 05/04/2015    Procedure: CORONARY ARTERY BYPASS GRAFTING (CABG)  x four,  using left internal mammary artery, and bilateral thigh greater saphenous veins;  Surgeon: [name], MD;  Location: [place];  Service: Open Heart Surgery;  Laterality: N/A;  . Tee without cardioversion N/A 05/04/2015    Procedure: TRANSESOPHAGEAL ECHOCARDIOGRAM (TEE);  Surgeon: [name], MD;  Location: [facility];  Service: Open Heart Surgery;  Laterality: N/A;  . Shoulder arthroscopy     Social History:  reports that he quit smoking about 28 years ago. His smoking  use included Cigarettes. He started smoking about 54 years ago. He has a 22.5 pack-year smoking history. He has never used smokeless tobacco. He reports that he does not drink alcohol or use illicit drugs. Patient lives at home  Allergies  Allergen Reactions  . Statins Other (See Comments)    Body aches    Family History  Problem Relation Age of Onset  . Diabetes Father   . Heart disease Father   . Hypertension Father   . Diabetes  Paternal Grandmother   . Heart disease Paternal Grandmother   . Hypertension Paternal Grandmother   . Diabetes Paternal Uncle   . Heart disease Paternal Uncle   . Hypertension Paternal Uncle      Prior to Admission medications   Medication Sig Start Date End Date Taking? Authorizing Provider  albuterol (PROVENTIL HFA;VENTOLIN HFA) 108 (90 BASE) MCG/ACT inhaler Inhale 1 puff into the lungs every 6 (six) hours as needed for wheezing or shortness of breath.    Yes Historical Provider, MD  aspirin EC 81 MG EC tablet Take 1 tablet (81 mg total) by mouth daily. 05/10/15  Yes Wayne E Gold, PA-C  benazepril (LOTENSIN) 20 MG tablet Take 1 tablet (20 mg total) by mouth daily. 05/18/15  Yes Herminio Commons, MD  carvedilol (COREG) 6.25 MG tablet TAKE 1 TABLET TWICE A DAY WITH MEALS 07/27/15  Yes Sharion Balloon, FNP  clopidogrel (PLAVIX) 75 MG tablet TAKE 1 TABLET DAILY 02/19/15  Yes Herminio Commons, MD  ezetimibe (ZETIA) 10 MG tablet Take 10 mg by mouth daily.   Yes Historical Provider, MD  fenofibrate micronized (LOFIBRA) 134 MG capsule TAKE 1 CAPSULE DAILY BEFORE BREAKFAST 04/30/15  Yes Chipper Herb, MD  levothyroxine (SYNTHROID, LEVOTHROID) 25 MCG tablet Take 1 tablet (25 mcg total) by mouth daily before breakfast. 05/10/15  Yes Wayne E Gold, PA-C  meloxicam (MOBIC) 15 MG tablet Take 15 mg by mouth daily as needed for pain (prn inflammation).   Yes Historical Provider, MD  metFORMIN (GLUCOPHAGE) 1000 MG tablet Take 1,000 mg by mouth 2 (two) times daily with a meal.   Yes Historical Provider, MD  nitroGLYCERIN (NITROSTAT) 0.4 MG SL tablet Place 0.4 mg under the tongue every 5 (five) minutes as needed for chest pain.   Yes Historical Provider, MD  sertraline (ZOLOFT) 100 MG tablet Take 100 mg by mouth daily.   Yes Historical Provider, MD  tiotropium (SPIRIVA) 18 MCG inhalation capsule Place 18 mcg into inhaler and inhale daily.   Yes Historical Provider, MD  traMADol (ULTRAM) 50 MG tablet Take 1  tablet by mouth every 4 (four) hours as needed for moderate pain.  05/10/15  Yes Historical Provider, MD    Physical Exam: BP 129/73 mmHg  Pulse 79  Temp(Src) 97.7 F (36.5 C) (Oral)  Resp 16  Ht 5\' 8"  (1.727 m)  Wt 84.369 kg (186 lb)  BMI 28.29 kg/m2  SpO2 96%  General: Older Caucasian male. Awake and alert and oriented x3. No acute cardiopulmonary distress.  HEENT: Normocephalic atraumatic.  Right and left ears normal in appearance.  Pupils equal, round, reactive to light. Extraocular muscles are intact. Sclerae anicteric and noninjected.  Moist mucosal membranes. No mucosal lesions.  Neck: Neck supple without lymphadenopathy. No carotid bruits. No masses palpated.  Cardiovascular: Regular rate with normal S1-S2 sounds. No murmurs, rubs, gallops auscultated. No JVD.  Respiratory: Good respiratory effort with no wheezes, rales, rhonchi. Lungs clear to auscultation bilaterally.  No accessory muscle use.  Abdomen: Soft, nontender, nondistended. Active bowel sounds. No masses or hepatosplenomegaly  Skin: No rashes, lesions, or ulcerations.  Dry, warm to touch. 2+ dorsalis pedis and radial pulses. Musculoskeletal: No calf or leg pain. All major joints not erythematous nontender.  No upper or lower joint deformation.  Good ROM.  No contractures.  Mild tenderness to palpation in the sternum and right chest.  Psychiatric: Intact judgment and insight. Pleasant and cooperative. Neurologic: No focal neurological deficits. Strength is 5/5 and symmetric in upper and lower extremities.  Cranial nerves II through XII are grossly intact.           Labs on Admission: I have personally reviewed following labs and imaging studies  CBC:  Recent Labs Lab 09/26/15 1409 09/26/15 1427  WBC 5.0  --   HGB 14.2 15.3  HCT 42.7 45.0  MCV 87.9  --   PLT 215  --    Basic Metabolic Panel:  Recent Labs Lab 09/26/15 1409 09/26/15 1427  NA 141 143  K 4.2 4.1  CL 107 103  CO2 26  --   GLUCOSE 141*  128*  BUN 20 20  CREATININE 0.92 1.00  CALCIUM 8.9  --    GFR: Estimated Creatinine Clearance: 72.7 mL/min (by C-G formula based on Cr of 1). Liver Function Tests:  Recent Labs Lab 09/26/15 1409  AST 22  ALT 20  ALKPHOS 46  BILITOT 0.5  PROT 7.0  ALBUMIN 4.3   No results for input(s): LIPASE, AMYLASE in the last 168 hours. No results for input(s): AMMONIA in the last 168 hours. Coagulation Profile:  Recent Labs Lab 09/26/15 1409  INR 1.02   Cardiac Enzymes: No results for input(s): CKTOTAL, CKMB, CKMBINDEX, TROPONINI in the last 168 hours. BNP (last 3 results) No results for input(s): PROBNP in the last 8760 hours. HbA1C: No results for input(s): HGBA1C in the last 72 hours. CBG: No results for input(s): GLUCAP in the last 168 hours. Lipid Profile: No results for input(s): CHOL, HDL, LDLCALC, TRIG, CHOLHDL, LDLDIRECT in the last 72 hours. Thyroid Function Tests: No results for input(s): TSH, T4TOTAL, FREET4, T3FREE, THYROIDAB in the last 72 hours. Anemia Panel: No results for input(s): VITAMINB12, FOLATE, FERRITIN, TIBC, IRON, RETICCTPCT in the last 72 hours. Urine analysis: No results found for: COLORURINE, APPEARANCEUR, LABSPEC, PHURINE, GLUCOSEU, HGBUR, BILIRUBINUR, KETONESUR, PROTEINUR, UROBILINOGEN, NITRITE, LEUKOCYTESUR Sepsis Labs: @LABRCNTIP (procalcitonin:4,lacticidven:4) )No results found for this or any previous visit (from the past 240 hour(s)).   Radiological Exams on Admission: Dg Chest Portable 1 View  09/26/2015  CLINICAL DATA:  Midsternal chest pain today EXAM: PORTABLE CHEST 1 VIEW COMPARISON:  08/09/2015 FINDINGS: The heart size and mediastinal contours are within normal limits. Both lungs are clear. The visualized skeletal structures are unremarkable. IMPRESSION: No active disease. Electronically Signed   By: Skipper Cliche M.D.   On: 09/26/2015 14:30    EKG: Independently reviewed. Sinus rhythm with PR of 0.221. An inverted T waves in the  inferior leads, which are new.  Assessment/Plan: Active Problems:   CAD (coronary artery disease)   Type 2 diabetes mellitus without complication, without long-term current use of insulin (HCC)   S/P CABG x 4   Chest pain    This patient was discussed with the ED physician, including pertinent vitals, physical exam findings, labs, and imaging.  We also discussed care given by the ED provider.  #1 Chest pain  Observation  Chest pain rule out - although patient has poor cardiac history, I doubt that  this is cardiac in origin more likely musculoskeletal due to the nature of the pain  The ED physician to talk with the cardiologist on call, who felt that the patient could be ruled out safely here and transferred if the patient has elevated troponins  Troponins every 3 hours 3 additional doses  Continuous telemetry monitoring  Continue aspirin #2 coronary artery disease  Continue anti-lipids #3 type 2 diabetes  Continue metformin #4 status post CABG 4  DVT prophylaxis: Lovenox Consultants: None Code Status: Full code Family Communication: Wife in the room  Disposition Plan: Observation   Truett Mainland, DO [facility] Pager [phone]  If 7PM-7AM, please contact night-coverage [email or web address] Password [email or web address]

## 2015-09-26 NOTE — ED Provider Notes (Signed)
CSN: [id number]     Arrival date & time 09/26/15  1345 History   First MD Initiated Contact with Patient 09/26/15 1347     Chief Complaint  Patient presents with  . Chest Pain     (Consider location/radiation/quality/duration/timing/severity/associated sxs/prior Treatment) HPI  The patient is a very pleasant 70 year old male, he has a known history of coronary disease status post bypass grafting approximately 5-1/2 months ago. He also has a history of COPD but has not smoked cigarettes since 1989, he is obese, he does have diabetes and takes all of his medications religiously including Plavix, aspirin and his diabetic medications which keep his blood sugar for the most part below 120. He reports that he has had several episodes over the last several months of chest pain, he was evaluated at the emergency department for this in the past and was also evaluated at the cardiologist office approximately one month ago. He notes that he has been able to ambulate to golf without any difficulty. Over the last 12 hours the patient has had several episodes of chest pain which is sternal and goes slightly to the right upper chest. This is intermittent, he reports that it lasts for only a short timeframe, less than 10 seconds but has some kind of underlying discomfort at baseline. There is no shortness of breath, no swelling of the legs, no exertional symptoms, no coughing or fevers.  Past Medical History  Diagnosis Date  . COPD (chronic obstructive pulmonary disease) ([place])   . CAD (coronary artery disease)   . Diabetes mellitus without complication ([place])   . Hypertension   . Sleep apnea   . Chemical exposure     agent orange   . PTSD (post-traumatic stress disorder)   . Diverticulitis   . Obesity (BMI 30-39.9)   . Bilateral carotid artery disease ([place]) 04/28/2015    1-39 percent bilateral stenosis noted on Doppler   . Lumbar disc disease   . Cervical disc disease    Past Surgical History   Procedure Laterality Date  . Lumbar laminectomy    . Cardiac catheterization    . Cholecystectomy    . Shoulder surgery Bilateral   . Replacement total knee      right   . Ptca      unsuccesful  . Cervical spine surgery    . I&d extremity Left 05/30/2013    Procedure: IRRIGATION AND DEBRIDEMENT Left Hand and Foreign body removal;  Surgeon: [name], MD;  Location: [place];  Service: Orthopedics;  Laterality: Left;  [name] Eye surgery Right     cataract  . Cardiac catheterization  03/18/14    difficult to determine culprit vesel  . Left heart catheterization with coronary angiogram N/A 03/18/2014    Procedure: LEFT HEART CATHETERIZATION WITH CORONARY ANGIOGRAM;  Surgeon: [name], MD;  Location: [facility] CATH LAB;  Service: Cardiovascular;  Laterality: N/A;  . Partial colectomy      For diverticulitis  . Cardiac catheterization N/A 04/29/2015    Procedure: Left Heart Cath and Coronary Angiography;  Surgeon: [name], MD;  Location: [place] CV LAB;  Service: Cardiovascular;  Laterality: N/A;  . Coronary artery bypass graft N/A 05/04/2015    Procedure: CORONARY ARTERY BYPASS GRAFTING (CABG)  x four,  using left internal mammary artery, and bilateral thigh greater saphenous veins;  Surgeon: [name], MD;  Location: [place];  Service: Open Heart Surgery;  Laterality: N/A;  . Tee without cardioversion N/A 05/04/2015  Procedure: TRANSESOPHAGEAL ECHOCARDIOGRAM (TEE);  Surgeon: [name], MD;  Location: [place];  Service: Open Heart Surgery;  Laterality: N/A;  . Shoulder arthroscopy     Family History  Problem Relation Age of Onset  . Diabetes Father   . Heart disease Father   . Hypertension Father   . Diabetes Paternal Grandmother   . Heart disease Paternal Grandmother   . Hypertension Paternal Grandmother   . Diabetes Paternal Uncle   . Heart disease Paternal Uncle   . Hypertension Paternal Uncle    Social History  Substance Use Topics  . Smoking  status: Former Smoker -- 0.75 packs/day for 30 years    Types: Cigarettes    Start date: 01/02/1961    Quit date: 05/31/1987  . Smokeless tobacco: Never Used  . Alcohol Use: No    Review of Systems  All other systems reviewed and are negative.     Allergies  Statins  Home Medications   Prior to Admission medications   Medication Sig Start Date End Date Taking? Authorizing Provider  albuterol (PROVENTIL HFA;VENTOLIN HFA) 108 (90 BASE) MCG/ACT inhaler Inhale 1 puff into the lungs every 6 (six) hours as needed for wheezing or shortness of breath.    Yes Historical Provider, MD  aspirin EC 81 MG EC tablet Take 1 tablet (81 mg total) by mouth daily. 05/10/15  Yes Wayne E Gold, PA-C  benazepril (LOTENSIN) 20 MG tablet Take 1 tablet (20 mg total) by mouth daily. 05/18/15  Yes Herminio Commons, MD  carvedilol (COREG) 6.25 MG tablet TAKE 1 TABLET TWICE A DAY WITH MEALS 07/27/15  Yes Sharion Balloon, FNP  clopidogrel (PLAVIX) 75 MG tablet TAKE 1 TABLET DAILY 02/19/15  Yes Herminio Commons, MD  ezetimibe (ZETIA) 10 MG tablet Take 10 mg by mouth daily.   Yes Historical Provider, MD  fenofibrate micronized (LOFIBRA) 134 MG capsule TAKE 1 CAPSULE DAILY BEFORE BREAKFAST 04/30/15  Yes Chipper Herb, MD  levothyroxine (SYNTHROID, LEVOTHROID) 25 MCG tablet Take 1 tablet (25 mcg total) by mouth daily before breakfast. 05/10/15  Yes Wayne E Gold, PA-C  meloxicam (MOBIC) 15 MG tablet Take 15 mg by mouth daily as needed for pain (prn inflammation).   Yes Historical Provider, MD  metFORMIN (GLUCOPHAGE) 1000 MG tablet Take 1,000 mg by mouth 2 (two) times daily with a meal.   Yes Historical Provider, MD  nitroGLYCERIN (NITROSTAT) 0.4 MG SL tablet Place 0.4 mg under the tongue every 5 (five) minutes as needed for chest pain.   Yes Historical Provider, MD  sertraline (ZOLOFT) 100 MG tablet Take 100 mg by mouth daily.   Yes Historical Provider, MD  tiotropium (SPIRIVA) 18 MCG inhalation capsule Place 18 mcg  into inhaler and inhale daily.   Yes Historical Provider, MD  traMADol (ULTRAM) 50 MG tablet Take 1 tablet by mouth every 4 (four) hours as needed for moderate pain.  05/10/15  Yes Historical Provider, MD   BP 129/73 mmHg  Pulse 79  Temp(Src) 97.7 F (36.5 C) (Oral)  Resp 16  Ht 5\' 8"  (1.727 m)  Wt 186 lb (84.369 kg)  BMI 28.29 kg/m2  SpO2 96% Physical Exam  Constitutional: He appears well-developed and well-nourished. No distress.  HENT:  Head: Normocephalic and atraumatic.  Mouth/Throat: Oropharynx is clear and moist. No oropharyngeal exudate.  Eyes: Conjunctivae and EOM are normal. Pupils are equal, round, and reactive to light. Right eye exhibits no discharge. Left eye exhibits no discharge. No scleral icterus.  Neck: Normal range of motion. Neck supple. No JVD present. No thyromegaly present.  Cardiovascular: Normal rate, regular rhythm, normal heart sounds and intact distal pulses.  Exam reveals no gallop and no friction rub.   No murmur heard. Pulmonary/Chest: Effort normal and breath sounds normal. No respiratory distress. He has no wheezes. He has no rales.  Abdominal: Soft. Bowel sounds are normal. He exhibits no distension and no mass. There is no tenderness.  Musculoskeletal: Normal range of motion. He exhibits no edema or tenderness.  Lymphadenopathy:    He has no cervical adenopathy.  Neurological: He is alert. Coordination normal.  Skin: Skin is warm and dry. No rash noted. No erythema.  Psychiatric: He has a normal mood and affect. His behavior is normal.  Nursing note and vitals reviewed.   ED Course  Procedures (including critical care time) Labs Review Labs Reviewed  COMPREHENSIVE METABOLIC PANEL - Abnormal; Notable for the following:    Glucose, Bld 141 (*)    All other components within normal limits  I-STAT CHEM 8, ED - Abnormal; Notable for the following:    Glucose, Bld 128 (*)    All other components within normal limits  APTT  CBC  PROTIME-INR   I-STAT TROPOININ, ED    Imaging Review Dg Chest Portable 1 View  09/26/2015  CLINICAL DATA:  Midsternal chest pain today EXAM: PORTABLE CHEST 1 VIEW COMPARISON:  08/09/2015 FINDINGS: The heart size and mediastinal contours are within normal limits. Both lungs are clear. The visualized skeletal structures are unremarkable. IMPRESSION: No active disease. Electronically Signed   By: Skipper Cliche M.D.   On: 09/26/2015 14:30   I have personally reviewed and evaluated these images and lab results as part of my medical decision-making.   EKG Interpretation   Date/Time:  Saturday September 26 2015 13:50:19 EDT Ventricular Rate:  68 PR Interval:  221 QRS Duration: 82 QT Interval:  370 QTC Calculation: 393 R Axis:   11 Text Interpretation:  Sinus rhythm Prolonged PR interval Borderline T  abnormalities, inferior leads now with inferior T wave abnormalities c/w  prior Abnormal ekg Confirmed by Sabra Heck  MD, Naliah Eddington ([id number]) on 09/26/2015  2:06:16 PM      MDM   Final diagnoses:  Chest pain, unspecified chest pain type    The patient has no reproducible chest pain to palpation, his EKG is abnormal and has new T-wave abnormalities however his cardiac exam is unremarkable, he has no JVD or peripheral edema. I will obtain some labs and discuss with cardiology, the patient is in agreement with the plan  D/w Dr. Wynonia Lawman - he states pt can stay at [facility] for EKG and cycle trop. D/w Dr. Nehemiah Settle who will admit Trop neg CXR neg Normal pulses at bilateral radial arteries.    Medications  nitroGLYCERIN (NITROSTAT) SL tablet 0.4 mg (0.4 mg Sublingual Given 09/26/15 1429)  aspirin chewable tablet 243 mg (243 mg Oral Given 09/26/15 1428)     Noemi Chapel, MD 09/26/15 1534

## 2015-09-26 NOTE — ED Notes (Signed)
MD at bedside. 

## 2015-09-26 NOTE — ED Notes (Signed)
Pt reports pain in center of chest radiating to right shoulder since quadruple bypass graft in December.  Pt has no other symptoms at this time.

## 2015-09-27 DIAGNOSIS — Z951 Presence of aortocoronary bypass graft: Secondary | ICD-10-CM

## 2015-09-27 DIAGNOSIS — E119 Type 2 diabetes mellitus without complications: Secondary | ICD-10-CM

## 2015-09-27 LAB — GLUCOSE, CAPILLARY: Glucose-Capillary: 127 mg/dL — ABNORMAL HIGH (ref 65–99)

## 2015-09-27 LAB — TROPONIN I

## 2015-09-27 MED ORDER — KETOROLAC TROMETHAMINE 10 MG PO TABS: 10.0000 mg | ORAL_TABLET | Freq: Four times a day (QID) | ORAL | Status: DC | PRN

## 2015-09-27 MED ORDER — KETOROLAC TROMETHAMINE 30 MG/ML IJ SOLN
30.0000 mg | INTRAMUSCULAR | Status: DC
  Filled 2015-09-27: qty 1

## 2015-09-27 NOTE — Discharge Summary (Signed)
Physician Discharge Summary  Eduardo [name] [id number] DOB: 1946-02-22 DOA: 09/26/2015  PCP: [name], MD  Admit date: 09/26/2015 Discharge date: 09/27/2015  Time spent: 20 minutes  Recommendations for Outpatient Follow-up:  1. Follow up with PCP in 2-3 weeks 2. Follow up with Cardiology as scheduled   Discharge Diagnoses:  Active Problems:   CAD (coronary artery disease)   Type 2 diabetes mellitus without complication, without long-term current use of insulin (HCC)   S/P CABG x 4   Chest pain   Discharge Condition: Stable  Diet recommendation: Diabetic, Heart healthy  Filed Weights   09/26/15 1353 09/26/15 1658  Weight: 84.369 kg (186 lb) 83.915 kg (185 lb)    History of present illness:  Please review dictated H and P from 4/29 for details. Briefly, 70 y.o. male with a history of type 2 diabetes, PTSD, COPD, coronary artery disease status post CABG 4 vessels and December 2016, obesity. Patient seen for onset of chest pain earlier today. Patient had 3 episodes of substernal sharp pain with no radiation. The episodes of chest pain lasted approximately 5 seconds and progressively became worse. These episodes occurred without activity. No palliating or provoking factors. The last episode was quite severe. No diaphoresis, shortness of breath, nausea with the chest pain   Hospital Course:  #1 Chest pain  Placed in Observation status overnight  Patient's presenting chest pains or more likely musculoskeletal due to the nature of the pain  The ED physician discussed the case with the cardiologist on call, who felt that the patient could be ruled out safely at this facility  Serial troponins were found to be negative 3  Patient remained stable on telemetry  On follow-up exam, chest pain was very reproducible on palpation. Pain was also improved with Toradol.  Patient will be discharged home with Toradol. Patient states that his home meloxicam does not seem to  be efficacious. #2 coronary artery disease  Continued anti-lipids #3 type 2 diabetes  Continued metformin #4 status post CABG 4  Discharge Exam: Filed Vitals:   09/26/15 1658 09/26/15 2129 09/27/15 0443 09/27/15 0914  BP: 128/73 107/69 135/73   Pulse: 60 57 58   Temp: 98.4 F (36.9 C) 98 F (36.7 C) 97.7 F (36.5 C)   TempSrc:  Oral Oral   Resp: 18 20 20    Height: 5\' 8"  (1.727 m)     Weight: 83.915 kg (185 lb)     SpO2: 97% 97% 100% 97%    General: Awake, in nad Cardiovascular: regular, s1, s2 Respiratory: normal resp effort, no wheezing  Discharge Instructions     Medication List    STOP taking these medications        meloxicam 15 MG tablet  Commonly known as:  MOBIC      TAKE these medications        albuterol 108 (90 Base) MCG/ACT inhaler  Commonly known as:  PROVENTIL HFA;VENTOLIN HFA  Inhale 1 puff into the lungs every 6 (six) hours as needed for wheezing or shortness of breath.     aspirin 81 MG EC tablet  Take 1 tablet (81 mg total) by mouth daily.     benazepril 20 MG tablet  Commonly known as:  LOTENSIN  Take 1 tablet (20 mg total) by mouth daily.     carvedilol 6.25 MG tablet  Commonly known as:  COREG  TAKE 1 TABLET TWICE A DAY WITH MEALS     clopidogrel 75 MG tablet  Commonly  known as:  PLAVIX  TAKE 1 TABLET DAILY     ezetimibe 10 MG tablet  Commonly known as:  ZETIA  Take 10 mg by mouth daily.     fenofibrate micronized 134 MG capsule  Commonly known as:  LOFIBRA  TAKE 1 CAPSULE DAILY BEFORE BREAKFAST     ketorolac 10 MG tablet  Commonly known as:  TORADOL  Take 1 tablet (10 mg total) by mouth every 6 (six) hours as needed.     levothyroxine 25 MCG tablet  Commonly known as:  SYNTHROID, LEVOTHROID  Take 1 tablet (25 mcg total) by mouth daily before breakfast.     metFORMIN 1000 MG tablet  Commonly known as:  GLUCOPHAGE  Take 1,000 mg by mouth 2 (two) times daily with a meal.     nitroGLYCERIN 0.4 MG SL tablet  Commonly  known as:  NITROSTAT  Place 0.4 mg under the tongue every 5 (five) minutes as needed for chest pain.     sertraline 100 MG tablet  Commonly known as:  ZOLOFT  Take 100 mg by mouth daily.     tiotropium 18 MCG inhalation capsule  Commonly known as:  SPIRIVA  Place 18 mcg into inhaler and inhale daily.     traMADol 50 MG tablet  Commonly known as:  ULTRAM  Take 1 tablet by mouth every 4 (four) hours as needed for moderate pain.       Allergies  Allergen Reactions  . Statins Other (See Comments)    Body aches   Follow-up Information    Follow up with [name], MD. Schedule an appointment as soon as possible for a visit in 2 weeks.   Specialty:  Pediatrics   Why:  Hospital follow up   Contact information:   [place] [address] [phone]       Follow up with Herminio Commons, MD.   Specialty:  Cardiology   Why:  Hospital follow up   Contact information:   [place]  [address] [phone]        The results of significant diagnostics from this hospitalization (including imaging, microbiology, ancillary and laboratory) are listed below for reference.    Significant Diagnostic Studies: Dg Chest Portable 1 View  09/26/2015  CLINICAL DATA:  Midsternal chest pain today EXAM: PORTABLE CHEST 1 VIEW COMPARISON:  08/09/2015 FINDINGS: The heart size and mediastinal contours are within normal limits. Both lungs are clear. The visualized skeletal structures are unremarkable. IMPRESSION: No active disease. Electronically Signed   By: Skipper Cliche M.D.   On: 09/26/2015 14:30    Microbiology: No results found for this or any previous visit (from the past 240 hour(s)).   Labs: Basic Metabolic Panel:  Recent Labs Lab 09/26/15 1409 09/26/15 1427  NA 141 143  K 4.2 4.1  CL 107 103  CO2 26  --   GLUCOSE 141* 128*  BUN 20 20  CREATININE 0.92 1.00  CALCIUM 8.9  --    Liver Function Tests:  Recent Labs Lab 09/26/15 1409   AST 22  ALT 20  ALKPHOS 46  BILITOT 0.5  PROT 7.0  ALBUMIN 4.3   No results for input(s): LIPASE, AMYLASE in the last 168 hours. No results for input(s): AMMONIA in the last 168 hours. CBC:  Recent Labs Lab 09/26/15 1409 09/26/15 1427  WBC 5.0  --   HGB 14.2 15.3  HCT 42.7 45.0  MCV 87.9  --  PLT 215  --    Cardiac Enzymes:  Recent Labs Lab 09/26/15 1712 09/26/15 2038 09/26/15 2332  TROPONINI <0.03 <0.03 <0.03   BNP: BNP (last 3 results) No results for input(s): BNP in the last 8760 hours.  ProBNP (last 3 results) No results for input(s): PROBNP in the last 8760 hours.  CBG:  Recent Labs Lab 09/27/15 0732  GLUCAP 127*    Signed:  Juanjesus Pepperman K  [facility] 09/27/2015, 5:12 PM

## 2015-09-27 NOTE — Care Management Obs Status (Signed)
[facility] NOTIFICATION   Patient Details  Name: JERMAYNE GALIN MRN: [id number] Date of Birth: Aug 21, 1945   Medicare Observation Status Notification Given:  Yes    Briant Sites, RN 09/27/2015, 10:57 AM

## 2015-10-07 ENCOUNTER — Other Ambulatory Visit: Payer: Self-pay | Admitting: Family Medicine

## 2015-10-23 ENCOUNTER — Other Ambulatory Visit: Payer: Self-pay | Admitting: Family

## 2015-10-31 ENCOUNTER — Other Ambulatory Visit: Payer: Self-pay | Admitting: Cardiovascular Disease

## 2015-11-10 DIAGNOSIS — M542 Cervicalgia: Secondary | ICD-10-CM | POA: Diagnosis not present

## 2015-11-10 DIAGNOSIS — M546 Pain in thoracic spine: Secondary | ICD-10-CM | POA: Diagnosis not present

## 2015-11-10 DIAGNOSIS — M9902 Segmental and somatic dysfunction of thoracic region: Secondary | ICD-10-CM | POA: Diagnosis not present

## 2015-11-10 DIAGNOSIS — M9901 Segmental and somatic dysfunction of cervical region: Secondary | ICD-10-CM | POA: Diagnosis not present

## 2015-11-10 NOTE — Telephone Encounter (Signed)
Erroneous Encounter

## 2015-11-12 DIAGNOSIS — M9902 Segmental and somatic dysfunction of thoracic region: Secondary | ICD-10-CM | POA: Diagnosis not present

## 2015-11-12 DIAGNOSIS — M9901 Segmental and somatic dysfunction of cervical region: Secondary | ICD-10-CM | POA: Diagnosis not present

## 2015-11-12 DIAGNOSIS — M546 Pain in thoracic spine: Secondary | ICD-10-CM | POA: Diagnosis not present

## 2015-11-12 DIAGNOSIS — M542 Cervicalgia: Secondary | ICD-10-CM | POA: Diagnosis not present

## 2015-11-16 ENCOUNTER — Other Ambulatory Visit: Payer: Self-pay | Admitting: Cardiovascular Disease

## 2015-11-24 DIAGNOSIS — M9901 Segmental and somatic dysfunction of cervical region: Secondary | ICD-10-CM | POA: Diagnosis not present

## 2015-11-24 DIAGNOSIS — M546 Pain in thoracic spine: Secondary | ICD-10-CM | POA: Diagnosis not present

## 2015-11-24 DIAGNOSIS — M542 Cervicalgia: Secondary | ICD-10-CM | POA: Diagnosis not present

## 2015-11-24 DIAGNOSIS — M9902 Segmental and somatic dysfunction of thoracic region: Secondary | ICD-10-CM | POA: Diagnosis not present

## 2015-11-27 DIAGNOSIS — M542 Cervicalgia: Secondary | ICD-10-CM | POA: Diagnosis not present

## 2015-11-27 DIAGNOSIS — M9901 Segmental and somatic dysfunction of cervical region: Secondary | ICD-10-CM | POA: Diagnosis not present

## 2015-11-27 DIAGNOSIS — M9902 Segmental and somatic dysfunction of thoracic region: Secondary | ICD-10-CM | POA: Diagnosis not present

## 2015-11-27 DIAGNOSIS — M546 Pain in thoracic spine: Secondary | ICD-10-CM | POA: Diagnosis not present

## 2015-11-30 DIAGNOSIS — M546 Pain in thoracic spine: Secondary | ICD-10-CM | POA: Diagnosis not present

## 2015-11-30 DIAGNOSIS — M9902 Segmental and somatic dysfunction of thoracic region: Secondary | ICD-10-CM | POA: Diagnosis not present

## 2015-11-30 DIAGNOSIS — M9901 Segmental and somatic dysfunction of cervical region: Secondary | ICD-10-CM | POA: Diagnosis not present

## 2015-11-30 DIAGNOSIS — M542 Cervicalgia: Secondary | ICD-10-CM | POA: Diagnosis not present

## 2015-12-03 DIAGNOSIS — M546 Pain in thoracic spine: Secondary | ICD-10-CM | POA: Diagnosis not present

## 2015-12-03 DIAGNOSIS — M9901 Segmental and somatic dysfunction of cervical region: Secondary | ICD-10-CM | POA: Diagnosis not present

## 2015-12-03 DIAGNOSIS — M545 Low back pain: Secondary | ICD-10-CM | POA: Diagnosis not present

## 2015-12-03 DIAGNOSIS — M542 Cervicalgia: Secondary | ICD-10-CM | POA: Diagnosis not present

## 2015-12-03 DIAGNOSIS — M9902 Segmental and somatic dysfunction of thoracic region: Secondary | ICD-10-CM | POA: Diagnosis not present

## 2015-12-03 DIAGNOSIS — M9903 Segmental and somatic dysfunction of lumbar region: Secondary | ICD-10-CM | POA: Diagnosis not present

## 2015-12-04 ENCOUNTER — Ambulatory Visit (INDEPENDENT_AMBULATORY_CARE_PROVIDER_SITE_OTHER): Payer: Medicare Other | Admitting: Pediatrics

## 2015-12-04 ENCOUNTER — Encounter: Payer: Self-pay | Admitting: Pediatrics

## 2015-12-04 VITALS — BP 100/65 | HR 75 | Temp 97.8°F | Ht 68.0 in | Wt 190.0 lb

## 2015-12-04 DIAGNOSIS — E119 Type 2 diabetes mellitus without complications: Secondary | ICD-10-CM

## 2015-12-04 DIAGNOSIS — I10 Essential (primary) hypertension: Secondary | ICD-10-CM

## 2015-12-04 DIAGNOSIS — J439 Emphysema, unspecified: Secondary | ICD-10-CM | POA: Diagnosis not present

## 2015-12-04 DIAGNOSIS — I25110 Atherosclerotic heart disease of native coronary artery with unstable angina pectoris: Secondary | ICD-10-CM | POA: Diagnosis not present

## 2015-12-04 DIAGNOSIS — D485 Neoplasm of uncertain behavior of skin: Secondary | ICD-10-CM | POA: Diagnosis not present

## 2015-12-04 DIAGNOSIS — M7520 Bicipital tendinitis, unspecified shoulder: Secondary | ICD-10-CM

## 2015-12-04 DIAGNOSIS — L821 Other seborrheic keratosis: Secondary | ICD-10-CM | POA: Diagnosis not present

## 2015-12-04 DIAGNOSIS — I6523 Occlusion and stenosis of bilateral carotid arteries: Secondary | ICD-10-CM | POA: Diagnosis not present

## 2015-12-04 NOTE — Patient Instructions (Addendum)
Biceps Tendon Tendinitis (Proximal) and Tenosynovitis With Rehab Tendonitis and tenosynovitis involve inflammation of the tendon and the tendon lining (sheath). The proximal biceps tendon is vulnerable to tendonitis and tenosynovitis, which causes pain and discomfort in the front of the shoulder and upper arm. The tendon lining secretes a fluid that helps lubricate the tendon, allowing for proper function without pain. When the tendon and its lining become inflamed, the tendon can no longer glide smoothly, causing pain. The proximal biceps tendon connects the biceps muscle to two bones of the shoulder. It is important for proper function of the elbow and turning the palm upward (supination) using the wrist. Proximal biceps tendon tendinitis may include a grade 1 or 2 strain of the tendon. Grade 1 strains involve a slight pull of the tendon without signs of tearing and no observed tendon lengthening. There is also no loss of strength. Grade 2 strains involve small tears in the tendon fibers. The tendon or muscle is stretched and strength is usually decreased.  SYMPTOMS   Pain, tenderness, swelling, warmth, or redness over the front of the shoulder.  Pain that gets worse with shoulder and elbow use, especially against resistance.  Limited motion of the shoulder or elbow.  Crackling sound (crepitation) when the tendon or shoulder is moved or touched. CAUSES  The symptoms of biceps tendonitis are due to inflammation of the tendon. Inflammation may be caused by:  Strain from sudden increase in amount or intensity of activity.  Direct blow or injury to the elbow (uncommon).  Overuse or repetitive elbow bending or wrist rotation, particularly when turning the palm up, or with elbow hyperextension. RISK INCREASES WITH:  Sports that involve contact or overhead arm activity (throwing sports, gymnastics, weightlifting, bodybuilding, rock climbing).  Heavy labor.  Poor strength and  flexibility.  Failure to warm up properly before activity. PREVENTION  Warm up and stretch properly before activity.  Allow time for recovery between activities.  Maintain physical fitness:  Strength, flexibility, and endurance.  Cardiovascular fitness.  Learn and use proper exercise technique. PROGNOSIS  With proper treatment, proximal biceps tendon tendonitis and tenosynovitis is usually curable within 6 weeks. Healing is usually quicker if the cause was a direct blow, not overuse.  RELATED COMPLICATIONS   Longer healing time if not properly treated or if not given enough time to heal.  Chronically inflamed tendon that causes persistent pain with activity, that may progress to constant pain and potentially rupture of the tendon.  Recurring symptoms, especially if activity is resumed too soon or with overuse, a direct blow, or use of poor exercise technique. TREATMENT Treatment first involves ice and medicine, to reduce pain and inflammation. It is helpful to modify activities that cause pain, to reduce the chances of causing the condition to get worse. Strengthening and stretching exercises should be performed to promote proper use of the muscles of the shoulder. These exercises may be performed at home or with a therapist. Other treatments may be given such as ultrasound or heat therapy. A corticosteroid injection may be recommended to help reduce inflammation of the tendon lining. Surgery is usually not necessary. Sometimes, if symptoms last for greater than 6 months, surgery will be advised to detach the tendon and re-insert it into the arm bone. Surgery to correct other shoulder problems that may be contributing to tendinitis may be advised before surgery for the tendinitis itself.  MEDICATION  If pain medicine is needed, nonsteroidal anti-inflammatory medicines (aspirin and ibuprofen), or other minor pain relievers (  acetaminophen), are often advised.  Do not take pain medicine  for 7 days before surgery.  Prescription pain relievers may be given if your caregiver thinks they are needed. Use only as directed and only as much as you need.  Corticosteroid injections may be given. These injections should only be used on the most severe cases, as one can only receive a limited number of them. HEAT AND COLD   Cold treatment (icing) should be applied for 10 to 15 minutes every 2 to 3 hours for inflammation and pain, and immediately after activity that aggravates your symptoms. Use ice packs or an ice massage.  Heat treatment may be used before performing stretching and strengthening activities prescribed by your caregiver, physical therapist, or athletic trainer. Use a heat pack or a warm water soak. SEEK MEDICAL CARE IF:   Symptoms get worse or do not improve in 2 weeks, despite treatment.  New, unexplained symptoms develop. (Drugs used in treatment may produce side effects.) EXERCISES RANGE OF MOTION (ROM) AND EXERCISES - Biceps Tendon (Proximal) and Tenosynovitis These exercises may help you when beginning to rehabilitate your injury. Your symptoms may go away with or without further involvement from your physician, physical therapist, or athletic trainer. While completing these exercises, remember:   Restoring tissue flexibility helps normal motion to return to the joints. This allows healthier, less painful movement and activity.  An effective stretch should be held for at least 30 seconds.  A stretch should never be painful. You should only feel a gentle lengthening or release in the stretched tissue. STRETCH - Flexion, Standing  Stand with good posture. With an underhand grip on your right / left hand and an overhand grip on the opposite hand, grasp a broomstick or cane so that your hands are a little more than shoulder width apart.  Keeping your right / left elbow straight and shoulder muscles relaxed, push the stick with your opposite hand to raise your right  / left arm in front of your body and then overhead. Raise your arm until you feel a stretch in your right / left shoulder, but before you have increased shoulder pain.  Try to avoid shrugging your right / left shoulder as your arm rises, by keeping your shoulder blade tucked down and toward your mid-back spine. Hold for __________ seconds.  Slowly return to the starting position. Repeat __________ times. Complete this exercise __________ times per day. STRETCH - Abduction, Supine  Lie on your back. With an underhand grip on your right / left hand and an overhand grip on the opposite hand, grasp a broomstick or cane so that your hands are a little more than shoulder width apart.  Keeping your right / left elbow straight and shoulder muscles relaxed, push the stick with your opposite hand to raise your right / left arm out to the side of your body and then overhead. Raise your arm until you feel a stretch in your right / left shoulder, but before you have increased shoulder pain.  Try to avoid shrugging your right / left shoulder as your arm rises, by keeping your shoulder blade tucked down and toward your mid-back spine. Hold for __________ seconds.  Slowly return to the starting position. Repeat __________ times. Complete this exercise __________ times per day. ROM - Flexion, Active-Assisted  Lie on your back. You may bend your knees for comfort.  Grasp a broomstick or cane so your hands are about shoulder width apart. Your right / left hand should   grip the end of the stick so that your hand is positioned "thumbs-up," as if you were about to shake hands.  Using your healthy arm to lead, raise your right / left arm overhead until you feel a gentle stretch in your shoulder. Hold for __________ seconds.  Use the stick to assist in returning your right / left arm to its starting position. Repeat __________ times. Complete this exercise __________ times per day.  STRETCH - Flexion, Standing    Stand facing a wall. Walk your right / left fingers up the wall until you feel a moderate stretch in your shoulder. As your hand gets higher, you may need to step closer to the wall or use a door frame to walk through.  Try to avoid shrugging your right / left shoulder as your arm rises, by keeping your shoulder blade tucked down and toward your mid-back spine.  Hold for __________ seconds. Use your other hand, if needed, to ease out of the stretch and return to the starting position. Repeat __________ times. Complete this exercise __________ times per day.  ROM - Internal Rotation   Using underhand grips, grasp a stick behind your back with both hands.  While standing upright with good posture, slide the stick up your back until you feel a mild stretch in the front of your shoulder.  Hold for __________ seconds. Slowly return to your starting position. Repeat __________ times. Complete this exercise __________ times per day.  STRETCH - Internal Rotation  Place your right / left hand behind your back, palm-up.  Throw a towel or belt over your opposite shoulder. Grasp the towel with your right / left hand.  While keeping an upright posture, gently pull up on the towel until you feel a stretch in the front of your right / left shoulder.  Avoid shrugging your right / left shoulder as your arm rises, by keeping your shoulder blade tucked down and toward your mid-back spine.  Hold for __________ seconds. Release the stretch by lowering your opposite hand. Repeat __________ times. Complete this exercise __________ times per day. STRENGTHENING EXERCISES - Biceps Tendon Tendinitis (Proximal) and Tenosynovitis These exercises may help you regain your strength after your physician has discontinued your restraint in a cast or brace. They may resolve your symptoms with or without further involvement from your physician, physical therapist or athletic trainer. While completing these exercises,  remember:   Muscles can gain both the endurance and the strength needed for everyday activities through controlled exercises.  Complete these exercises as instructed by your physician, physical therapist or athletic trainer. Increase the resistance and repetitions only as guided.  You may experience muscle soreness or fatigue, but the pain or discomfort you are trying to eliminate should never worsen during these exercises. If this pain does get worse, stop and make sure you are following the directions exactly. If the pain is still present after adjustments, discontinue the exercise until you can discuss the trouble with your caregiver. STRENGTH - Elbow Flexors, Isometric  Stand or sit upright on a firm surface. Place your right / left arm so that your hand is palm-up and at the height of your waist.  Place your opposite hand on top of your forearm. Gently push down as your right / left arm resists. Push as hard as you can with both arms, without causing any pain or movement at your right / left elbow. Hold this stationary position for __________ seconds.  Gradually release the tension in both   arms. Allow your muscles to relax completely before repeating. Repeat __________ times. Complete this exercise __________ times per day. STRENGTH - Shoulder Flexion, Isometric  With good posture and facing a wall, stand or sit about 4-6 inches away.  Keeping your right / left elbow straight, gently press the top of your fist into the wall. Increase the pressure gradually until you are pressing as hard as you can, without shrugging your shoulder or increasing any shoulder discomfort.  Hold for __________ seconds.  Release the tension slowly. Relax your shoulder muscles completely before you start the next repetition. Repeat __________ times. Complete this exercise __________ times per day.  STRENGTH - Elbow Flexors, Supinated  With good posture, stand or sit on a firm chair without armrests. Allow  your right / left arm to rest at your side with your palm facing forward.  Holding a __________ weight, or gripping a rubber exercise band or tubing,  bring your hand toward your shoulder.  Allow your muscles to control the resistance as your hand returns to your side. Repeat __________ times. Complete this exercise __________ times per day.  STRENGTH - Shoulder Flexion  Stand or sit with good posture. Grasp a __________ weight, or an exercise band or tubing, so that your hand is "thumbs-up," like when you shake hands.  Slowly lift your right / left arm as far as you can, without increasing any shoulder pain. At first, many people can only raise their hand to shoulder height.  Avoid shrugging your right / left shoulder as your arm rises, by keeping your shoulder blade tucked down and toward your mid-back spine.  Hold for __________ seconds. Control the descent of your hand as you slowly return to your starting position. Repeat __________ times. Complete this exercise __________ times per day.   This information is not intended to replace advice given to you by your health care provider. Make sure you discuss any questions you have with your health care provider.   Document Released: 05/16/2005 Document Revised: 06/06/2014 Document Reviewed: 08/28/2008 Elsevier Interactive Patient Education 2016 [employer]   

## 2015-12-04 NOTE — Progress Notes (Signed)
Subjective:    Patient ID: [name], male    DOB: 1946/02/24, 70 y.o.   MRN: [id number]  CC: Follow-up; Wound Right arm and back; and Shoulder Pain   HPI: ALDEAN KILMER is a 70 y.o. male presenting for Follow-up; Wound Right arm and back; and Shoulder Pain  Chest pain: no more chest pain since mid April Admitted then for CP rule out  COPD: breathing has been fine Doesn't use spiriva regularly Followed at the [facility]  DM2: followed at the [facility] They check labs regularly Has appt with them next week  HTN: no headaches, vision changes BPs at home have been in good range  Has two areas on skin his wife has noticed that she is worried about  Was bitten by something a few days ago, has area on arm that is red, wife squeezed it and small amount of pus came out yesterday  Depression screen [facility] 2/9 12/04/2015 07/09/2015 05/27/2015 01/27/2015  Decreased Interest 0 0 0 0  Down, Depressed, Hopeless 0 0 0 0  PHQ - 2 Score 0 0 0 0  Altered sleeping - - 0 -  Tired, decreased energy - - 0 -  Change in appetite - - 0 -  Feeling bad or failure about yourself  - - 0 -  Trouble concentrating - - 0 -  Moving slowly or fidgety/restless - - 0 -  Suicidal thoughts - - 0 -  PHQ-9 Score - - 0 -     Relevant past medical, surgical, family and social history reviewed and updated as indicated.  Interim medical history since our last visit reviewed. Allergies and medications reviewed and updated.  ROS: Per HPI unless specifically indicated above  History  Smoking status  . Former Smoker -- 0.75 packs/day for 30 years  . Types: Cigarettes  . Start date: 01/02/1961  . Quit date: 05/31/1987  Smokeless tobacco  . Never Used       Objective:    BP 100/65 mmHg  Pulse 75  Temp(Src) 97.8 F (36.6 C) (Oral)  Ht 5\' 8"  (1.727 m)  Wt 190 lb (86.183 kg)  BMI 28.90 kg/m2  Wt Readings from Last 3 Encounters:  12/04/15 190 lb (86.183 kg)  09/26/15 185 lb (83.915 kg)  08/17/15  193 lb 12.8 oz (87.907 kg)     Gen: NAD, alert, cooperative with exam, NCAT EYES: EOMI, no scleral injection or icterus CV: NRRR, normal S1/S2, no murmur, distal pulses 2+ b/l Resp: moving air well, no wheezes, slightly prolonged exp phase, normal WOB Ext: No edema, warm Neuro: Alert and oriented, strength equal b/l UE and LE, coordination grossly normal MSK: tender over insertion of prox biceps tendon b/l Skin: R upper middle back with gray plaque with portion of it uneven and darker than other part L lower back with similar lesion, uneven portion of a plaque that is consistent with seb keratosis     Assessment & Plan:    Zykell was seen today for follow-up, wound right arm and back and shoulder pain.  Diagnoses and all orders for this visit:  Pulmonary emphysema, unspecified emphysema type ([place]) Stable, use spiriva daily  Type 2 diabetes mellitus without complication, without long-term current use of insulin ([place]) Bring labs from [place] to next visit  Neoplasm of uncertain behavior of skin -     Pathology -     Pathology  Biceps tendonitis, unspecified laterality Rest, ice, gentle stretching/exercises  Coronary artery disease involving native coronary artery  of native heart with unstable angina pectoris ([place]) Currently asymptomatic  Essential hypertension Well controlled today No symptoms of low BP  PROCEDURE After risks and benefits and procedure itself discussed in detail, pt agreed to proceed with biopsy of two lesions on back. Area was thoroughly cleaned and prepped. 4mL of 1% lidocaine with epi was injected under the the lesion. Using a dermablade, the lesion was removed. Wound dressed, wound care discussed with pt.   Follow up plan: Return in about 3 months (around 03/05/2016).  Assunta Found, MD [facility] 12/04/2015, 11:02 AM

## 2015-12-08 LAB — PATHOLOGY

## 2015-12-15 ENCOUNTER — Ambulatory Visit: Payer: TRICARE For Life (TFL) | Admitting: Cardiovascular Disease

## 2015-12-18 ENCOUNTER — Encounter: Payer: Self-pay | Admitting: Cardiovascular Disease

## 2015-12-18 ENCOUNTER — Ambulatory Visit (INDEPENDENT_AMBULATORY_CARE_PROVIDER_SITE_OTHER): Payer: Medicare Other | Admitting: Cardiovascular Disease

## 2015-12-18 VITALS — BP 143/80 | HR 59 | Ht 68.0 in | Wt 192.0 lb

## 2015-12-18 DIAGNOSIS — I480 Paroxysmal atrial fibrillation: Secondary | ICD-10-CM

## 2015-12-18 DIAGNOSIS — I6523 Occlusion and stenosis of bilateral carotid arteries: Secondary | ICD-10-CM | POA: Diagnosis not present

## 2015-12-18 DIAGNOSIS — I10 Essential (primary) hypertension: Secondary | ICD-10-CM | POA: Diagnosis not present

## 2015-12-18 DIAGNOSIS — I25768 Atherosclerosis of bypass graft of coronary artery of transplanted heart with other forms of angina pectoris: Secondary | ICD-10-CM | POA: Diagnosis not present

## 2015-12-18 DIAGNOSIS — Z889 Allergy status to unspecified drugs, medicaments and biological substances status: Secondary | ICD-10-CM

## 2015-12-18 DIAGNOSIS — E785 Hyperlipidemia, unspecified: Secondary | ICD-10-CM

## 2015-12-18 DIAGNOSIS — I712 Thoracic aortic aneurysm, without rupture, unspecified: Secondary | ICD-10-CM

## 2015-12-18 DIAGNOSIS — Z789 Other specified health status: Secondary | ICD-10-CM

## 2015-12-18 NOTE — Progress Notes (Signed)
Patient ID: Eduardo [name], male   DOB: [birth year], 70 y.o.   MRN: [id number]      SUBJECTIVE: The patient presents for follow-up of coronary artery disease and CABG. He has been doing well and denies chest pain, palpitations, shortness of breath, leg swelling. He continues to do extensive yardwork and golfs without difficulty.   Review of Systems: As per "subjective", otherwise negative.  Allergies  Allergen Reactions  . Statins Other (See Comments)    Body aches    Current Outpatient Prescriptions  Medication Sig Dispense Refill  . albuterol (PROVENTIL HFA;VENTOLIN HFA) 108 (90 BASE) MCG/ACT inhaler Inhale 1 puff into the lungs every 6 (six) hours as needed for wheezing or shortness of breath.     [name] aspirin EC 81 MG EC tablet Take 1 tablet (81 mg total) by mouth daily.    . benazepril (LOTENSIN) 20 MG tablet Take 1 tablet (20 mg total) by mouth daily. 90 tablet 3  . carvedilol (COREG) 6.25 MG tablet TAKE 1 TABLET TWICE A DAY WITH MEALS 180 tablet 0  . clopidogrel (PLAVIX) 75 MG tablet TAKE 1 TABLET DAILY 90 tablet 3  . ezetimibe (ZETIA) 10 MG tablet Take 10 mg by mouth daily.    . fenofibrate micronized (LOFIBRA) 134 MG capsule TAKE 1 CAPSULE DAILY BEFORE BREAKFAST 90 capsule 0  . ketorolac (TORADOL) 10 MG tablet Take 1 tablet (10 mg total) by mouth every 6 (six) hours as needed. 20 tablet 0  . levothyroxine (SYNTHROID, LEVOTHROID) 25 MCG tablet Take 1 tablet (25 mcg total) by mouth daily before breakfast. 30 tablet 1  . meloxicam (MOBIC) 15 MG tablet Take 15 mg by mouth daily as needed for pain.    . metFORMIN (GLUCOPHAGE) 1000 MG tablet Take 1,000 mg by mouth 2 (two) times daily with a meal.    . nitroGLYCERIN (NITROSTAT) 0.4 MG SL tablet Place 0.4 mg under the tongue every 5 (five) minutes as needed for chest pain.    [name] sertraline (ZOLOFT) 100 MG tablet Take 100 mg by mouth daily.    [name] tiotropium (SPIRIVA) 18 MCG inhalation capsule Place 18 mcg into inhaler and inhale daily.     . traMADol (ULTRAM) 50 MG tablet Take 1 tablet by mouth every 4 (four) hours as needed for moderate pain.      No current facility-administered medications for this visit.    Past Medical History  Diagnosis Date  . COPD (chronic obstructive pulmonary disease) ([place])   . CAD (coronary artery disease)   . Diabetes mellitus without complication ([place])   . Hypertension   . Sleep apnea   . Chemical exposure     agent orange   . PTSD (post-traumatic stress disorder)   . Diverticulitis   . Obesity (BMI 30-39.9)   . Bilateral carotid artery disease ([place]) 04/28/2015    1-39 percent bilateral stenosis noted on Doppler   . Lumbar disc disease   . Cervical disc disease     Past Surgical History  Procedure Laterality Date  . Lumbar laminectomy    . Cardiac catheterization    . Cholecystectomy    . Shoulder surgery Bilateral   . Replacement total knee      right   . Ptca      unsuccesful  . Cervical spine surgery    . I&d extremity Left 05/30/2013    Procedure: IRRIGATION AND DEBRIDEMENT Left Hand and Foreign body removal;  Surgeon: [name], MD;  Location: [facility];  Service:  Orthopedics;  Laterality: Left;  [name] Eye surgery Right     cataract  . Cardiac catheterization  03/18/14    difficult to determine culprit vesel  . Left heart catheterization with coronary angiogram N/A 03/18/2014    Procedure: LEFT HEART CATHETERIZATION WITH CORONARY ANGIOGRAM;  Surgeon: [name], MD;  Location: [facility] CATH LAB;  Service: Cardiovascular;  Laterality: N/A;  . Partial colectomy      For diverticulitis  . Cardiac catheterization N/A 04/29/2015    Procedure: Left Heart Cath and Coronary Angiography;  Surgeon: [name], MD;  Location: [place] CV LAB;  Service: Cardiovascular;  Laterality: N/A;  . Coronary artery bypass graft N/A 05/04/2015    Procedure: CORONARY ARTERY BYPASS GRAFTING (CABG)  x four,  using left internal mammary artery, and bilateral thigh greater saphenous  veins;  Surgeon: [name], MD;  Location: [facility];  Service: Open Heart Surgery;  Laterality: N/A;  . Tee without cardioversion N/A 05/04/2015    Procedure: TRANSESOPHAGEAL ECHOCARDIOGRAM (TEE);  Surgeon: [name], MD;  Location: [facility];  Service: Open Heart Surgery;  Laterality: N/A;  . Shoulder arthroscopy      Social History   Social History  . Marital Status: Married    Spouse Name: N/A  . Number of Children: N/A  . Years of Education: N/A   Occupational History  . Not on file.   Social History Main Topics  . Smoking status: Former Smoker -- 0.75 packs/day for 30 years    Types: Cigarettes    Start date: 01/02/1961    Quit date: 05/31/1987  . Smokeless tobacco: Never Used  . Alcohol Use: No  . Drug Use: No  . Sexual Activity: Not on file   Other Topics Concern  . Not on file   Social History Narrative     Filed Vitals:   12/18/15 1048  BP: 143/80  Pulse: 59  Height: 5\' 8"  (1.727 m)  Weight: 192 lb (87.091 kg)    PHYSICAL EXAM General: NAD HEENT: Normal. Neck: No JVD, no thyromegaly. Lungs: Clear to auscultation bilaterally with normal respiratory effort. CV: Nondisplaced PMI.  Regular rate and rhythm, normal S1/S2, no S3/S4, no murmur. No pretibial or periankle edema.  No carotid bruit.   Abdomen: Soft, nontender, no distention.  Neurologic: Alert and oriented.  Psych: Normal affect. Skin: Normal. Musculoskeletal: No gross deformities.    ECG: Most recent ECG reviewed.      ASSESSMENT AND PLAN: 1. CAD s/p 4-v CABG: Stable ischemic heart disease. Continue aspirin 81 mg, Plavix 75 mg, ACEI, and Coreg 6.25 mg twice daily.   2. Essential HTN: Mildly elevated. No changes.  3. Hyperlipidemia: Intolerant to statin therapy. 04/29/15 HDL 29, LDL 36, TC 122, TG 284. Continue Zetia 10 mg.  4. Thoracic aortic aneurysm: Follow[facility] by [facility].  5. Carotid artery stenosis: Carotid Dopplers in October 2015 demonstrated 1-39% bilateral carotid artery  stenosis. Continue aspirin and Plavix. Intolerant to statins.  6. Paroxysmal atrial fibrillation: No further episodes. No longer on amiodarone.  Dispo: f/u 1 year.  [name], M.D., F.A.C.C.

## 2015-12-18 NOTE — Patient Instructions (Signed)

## 2015-12-22 DIAGNOSIS — M9903 Segmental and somatic dysfunction of lumbar region: Secondary | ICD-10-CM | POA: Diagnosis not present

## 2015-12-22 DIAGNOSIS — M9902 Segmental and somatic dysfunction of thoracic region: Secondary | ICD-10-CM | POA: Diagnosis not present

## 2015-12-22 DIAGNOSIS — M546 Pain in thoracic spine: Secondary | ICD-10-CM | POA: Diagnosis not present

## 2015-12-22 DIAGNOSIS — M542 Cervicalgia: Secondary | ICD-10-CM | POA: Diagnosis not present

## 2015-12-22 DIAGNOSIS — M9901 Segmental and somatic dysfunction of cervical region: Secondary | ICD-10-CM | POA: Diagnosis not present

## 2015-12-22 DIAGNOSIS — M545 Low back pain: Secondary | ICD-10-CM | POA: Diagnosis not present

## 2016-01-04 ENCOUNTER — Other Ambulatory Visit: Payer: Self-pay | Admitting: *Deleted

## 2016-01-04 MED ORDER — CARVEDILOL 6.25 MG PO TABS: 6.2500 mg | ORAL_TABLET | Freq: Two times a day (BID) | ORAL | 3 refills | Status: DC

## 2016-01-05 ENCOUNTER — Other Ambulatory Visit: Payer: Self-pay | Admitting: Pediatrics

## 2016-01-07 ENCOUNTER — Ambulatory Visit (INDEPENDENT_AMBULATORY_CARE_PROVIDER_SITE_OTHER): Payer: Medicare Other | Admitting: Pediatrics

## 2016-01-07 ENCOUNTER — Encounter: Payer: Self-pay | Admitting: Pediatrics

## 2016-01-07 VITALS — BP 124/77 | HR 68 | Temp 97.1°F | Ht 68.0 in | Wt 190.4 lb

## 2016-01-07 DIAGNOSIS — I6523 Occlusion and stenosis of bilateral carotid arteries: Secondary | ICD-10-CM

## 2016-01-07 DIAGNOSIS — E119 Type 2 diabetes mellitus without complications: Secondary | ICD-10-CM

## 2016-01-07 DIAGNOSIS — I10 Essential (primary) hypertension: Secondary | ICD-10-CM

## 2016-01-07 DIAGNOSIS — I25110 Atherosclerotic heart disease of native coronary artery with unstable angina pectoris: Secondary | ICD-10-CM

## 2016-01-07 DIAGNOSIS — J439 Emphysema, unspecified: Secondary | ICD-10-CM

## 2016-01-07 LAB — BAYER DCA HB A1C WAIVED: HB A1C (BAYER DCA - WAIVED): 6.4 % (ref <7.0)

## 2016-01-07 NOTE — Progress Notes (Signed)
    Subjective:    Patient ID: Eduardo [name], male    DOB: 1945-11-04, 70 y.o.   MRN: [id number]  CC: Follow-up DM2  HPI: Eduardo [name] is a 70 y.o. male presenting for Follow-up  Still having some pain mid chest when he moves the wrong way Walking some No chest pain with exertion Sometimes has to stop and rest Doing exercises regularly Breathing has been fine  [facility] last appt 8 mo Colonoscopy: done at the [place] 1.5 years ago   Relevant past medical, surgical, family and social history reviewed and updated. Interim medical history since our last visit reviewed. Allergies and medications reviewed and updated.  History  Smoking Status  . Former Smoker  . Packs/day: 0.75  . Years: 30.00  . Types: Cigarettes  . Start date: 01/02/1961  . Quit date: 05/31/1987  Smokeless Tobacco  . Never Used    ROS: Per HPI      Objective:    BP 124/77   Pulse 68   Temp 97.1 F (36.2 C) (Oral)   Ht 5\' 8"  (1.727 m)   Wt 190 lb 6.4 oz (86.4 kg)   BMI 28.95 kg/m   Wt Readings from Last 3 Encounters:  01/07/16 190 lb 6.4 oz (86.4 kg)  12/18/15 192 lb (87.1 kg)  12/04/15 190 lb (86.2 kg)    Gen: NAD, alert, cooperative with exam, NCAT EYES: EOMI, no scleral injection or icterus ENT:  TMs pearly gray b/l, OP without erythema LYMPH: no cervical LAD CV: NRRR, normal S1/S2, no murmur, distal pulses 2+ b/l Resp: CTABL, no wheezes, normal WOB Abd: +BS, soft, NTND. no guarding or organomegaly Ext: No edema, warm Neuro: Alert and oriented, strength equal b/l UE and LE, coordination grossly normal MSK: normal muscle bulk     Assessment & Plan:    Tomohiro was seen today for follow-up multiple med problems.  Diagnoses and all orders for this visit:  Type 2 diabetes mellitus without complication, without long-term current use of insulin (HCC) Cont metformin A1c 6.4 today Foot exam normal today Has had eye exam by doctor in [place] with  the [place] [place] Hb A1c Waived  Coronary artery disease involving native coronary artery of native heart with unstable angina pectoris ([place]) No new symptoms Still sore with certain movements mid chest around surgery site  Essential hypertension Well controlled Cont current meds  Pulmonary emphysema, unspecified emphysema type ([place]) Adequate control On spiriva  Colon ca screen: colonoscopy completed at [facility] 1.5-2 yrs ago  Follow up plan: 3-6 months  Assunta Found, MD [place] 01/07/2016, 8:37 AM

## 2016-01-19 DIAGNOSIS — M545 Low back pain: Secondary | ICD-10-CM | POA: Diagnosis not present

## 2016-01-19 DIAGNOSIS — M9902 Segmental and somatic dysfunction of thoracic region: Secondary | ICD-10-CM | POA: Diagnosis not present

## 2016-01-19 DIAGNOSIS — M9901 Segmental and somatic dysfunction of cervical region: Secondary | ICD-10-CM | POA: Diagnosis not present

## 2016-01-19 DIAGNOSIS — M9903 Segmental and somatic dysfunction of lumbar region: Secondary | ICD-10-CM | POA: Diagnosis not present

## 2016-01-19 DIAGNOSIS — M546 Pain in thoracic spine: Secondary | ICD-10-CM | POA: Diagnosis not present

## 2016-01-19 DIAGNOSIS — M542 Cervicalgia: Secondary | ICD-10-CM | POA: Diagnosis not present

## 2016-02-08 ENCOUNTER — Ambulatory Visit (INDEPENDENT_AMBULATORY_CARE_PROVIDER_SITE_OTHER): Payer: Medicare Other | Admitting: Family Medicine

## 2016-02-08 ENCOUNTER — Ambulatory Visit (INDEPENDENT_AMBULATORY_CARE_PROVIDER_SITE_OTHER): Payer: Medicare Other

## 2016-02-08 VITALS — BP 169/93 | Temp 98.2°F | Ht 68.0 in | Wt 190.0 lb

## 2016-02-08 DIAGNOSIS — M79642 Pain in left hand: Secondary | ICD-10-CM

## 2016-02-08 DIAGNOSIS — I6523 Occlusion and stenosis of bilateral carotid arteries: Secondary | ICD-10-CM

## 2016-02-08 DIAGNOSIS — S61213A Laceration without foreign body of left middle finger without damage to nail, initial encounter: Secondary | ICD-10-CM

## 2016-02-08 DIAGNOSIS — S61209A Unspecified open wound of unspecified finger without damage to nail, initial encounter: Secondary | ICD-10-CM | POA: Diagnosis not present

## 2016-02-08 DIAGNOSIS — S61211A Laceration without foreign body of left index finger without damage to nail, initial encounter: Secondary | ICD-10-CM | POA: Diagnosis not present
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
## 2016-02-23 ENCOUNTER — Other Ambulatory Visit: Payer: Self-pay | Admitting: Pediatrics

## 2016-02-23 NOTE — Telephone Encounter (Signed)
appt scheduled for bilateral shoulder pain

## 2016-02-25 ENCOUNTER — Encounter: Payer: Self-pay | Admitting: Family Medicine

## 2016-02-25 ENCOUNTER — Ambulatory Visit (INDEPENDENT_AMBULATORY_CARE_PROVIDER_SITE_OTHER): Payer: Medicare Other | Admitting: Family Medicine

## 2016-02-25 VITALS — BP 132/80 | HR 63 | Temp 98.7°F | Ht 68.0 in | Wt 191.0 lb

## 2016-02-25 DIAGNOSIS — M25511 Pain in right shoulder: Secondary | ICD-10-CM | POA: Diagnosis not present

## 2016-02-25 DIAGNOSIS — I6523 Occlusion and stenosis of bilateral carotid arteries: Secondary | ICD-10-CM | POA: Diagnosis not present

## 2016-02-25 DIAGNOSIS — M25512 Pain in left shoulder: Secondary | ICD-10-CM

## 2016-02-25 MED ORDER — TRAMADOL HCL 50 MG PO TABS: 50.0000 mg | ORAL_TABLET | Freq: Four times a day (QID) | ORAL | 0 refills | Status: DC | PRN

## 2016-02-25 MED ORDER — DICLOFENAC SODIUM 1 % TD GEL: 2.0000 g | Freq: Four times a day (QID) | TRANSDERMAL | 3 refills | Status: DC

## 2016-02-25 NOTE — Patient Instructions (Signed)
Great to see you!  Stop meloxicam and ibuprofen   Try tramadol for pain, also try voltaren gel 4 times daily for a few days.   I have written a  Referral for [facility], you will be called with an appt

## 2016-02-25 NOTE — Progress Notes (Signed)
   HPI  Patient presents today here with bilateral shoulder pain.  Patient's complains of severe bilateral shoulder pain over the last 3 months which seems to be getting worse. The shoulder pain is anterior and alignment extending down to his biceps.  He's been trying over-the-counter icy hot, ice, and heat with some improvement transiently, however it's not completely helping.  He states he's taking 2400 mg of ibuprofen daily, he's also taking meloxicam.  He denies any chest pain.  He denies any injury to the shoulder recently.  He has history of bilateral rotator cuff repairs and repeat on the left.  PMH: Smoking status noted ROS: Per HPI  Objective: BP 132/80   Pulse 63   Temp 98.7 F (37.1 C) (Oral)   Ht 5\' 8"  (1.727 m)   Wt 191 lb (86.6 kg)   BMI 29.04 kg/m  Gen: NAD, alert, cooperative with exam HEENT: NCAT CV: RRR, good S1/S2, no murmur Resp: CTABL, no wheezes, non-labored Ext: No edema, warm Neuro: Alert and oriented, No gross deficits  Musculoskeletal: Bilateral crepitus with shoulder movement, full range of motion with abduction and full reflection. Severe tenderness with empty can and Hawkins sign Tenderness to palpation over the biceps tendon and bicipital groove   Assessment and plan:  # Bilateral shoulder pain With his extensive surgical history I think he will get most benefit from seeing orthopedic surgery He does have tenderness over the bicipital groove which could be biceps tendinitis, however he also has tenderness with Hawkins and empty can test Recommended stopping NSAIDs with history of CAD and the fact that he's been taking 15 mg of meloxicam plus full dose ibuprofen Tramadol for pain, also trial of Voltaren gel   Orders Placed This Encounter  Procedures  . Ambulatory referral to Orthopedic Surgery    Referral Priority:   Routine    Referral Type:   Surgical    Referral Reason:   Specialty Services Required    Requested Specialty:    Orthopedic Surgery    Number of Visits Requested:   1    Meds ordered this encounter  Medications  . traMADol (ULTRAM) 50 MG tablet    Sig: Take 1 tablet (50 mg total) by mouth every 6 (six) hours as needed for moderate pain.    Dispense:  60 tablet    Refill:  0  . diclofenac sodium (VOLTAREN) 1 % GEL    Sig: Apply 2 g topically 4 (four) times daily.    Dispense:  100 g    Refill:  [place], MD [facility] 02/25/2016, 10:37 AM

## 2016-03-01 ENCOUNTER — Ambulatory Visit: Payer: Medicare Other | Admitting: Family Medicine

## 2016-03-16 ENCOUNTER — Ambulatory Visit (INDEPENDENT_AMBULATORY_CARE_PROVIDER_SITE_OTHER): Payer: Medicare Other | Admitting: Pediatrics

## 2016-03-16 ENCOUNTER — Encounter: Payer: Self-pay | Admitting: Pediatrics

## 2016-03-16 VITALS — BP 121/81 | HR 74 | Temp 98.2°F | Ht 68.0 in | Wt 189.2 lb

## 2016-03-16 DIAGNOSIS — E875 Hyperkalemia: Secondary | ICD-10-CM

## 2016-03-16 DIAGNOSIS — E119 Type 2 diabetes mellitus without complications: Secondary | ICD-10-CM

## 2016-03-16 DIAGNOSIS — E039 Hypothyroidism, unspecified: Secondary | ICD-10-CM | POA: Diagnosis not present

## 2016-03-16 DIAGNOSIS — I10 Essential (primary) hypertension: Secondary | ICD-10-CM | POA: Diagnosis not present

## 2016-03-16 DIAGNOSIS — M25512 Pain in left shoulder: Secondary | ICD-10-CM | POA: Diagnosis not present

## 2016-03-16 DIAGNOSIS — G8929 Other chronic pain: Secondary | ICD-10-CM

## 2016-03-16 DIAGNOSIS — I6523 Occlusion and stenosis of bilateral carotid arteries: Secondary | ICD-10-CM | POA: Diagnosis not present

## 2016-03-16 LAB — BAYER DCA HB A1C WAIVED: HB A1C: 6.4 % (ref <7.0)

## 2016-03-16 NOTE — Progress Notes (Addendum)
  Subjective:   Patient ID: Eduardo [name], male    DOB: Mar 24, 1946, 70 y.o.   MRN: [id number] CC: Medication check and Shoulder Pain  HPI: Eduardo [name] is a 70 y.o. male presenting for Medication check and Shoulder Pain  Shoulder has been bothering him Had rotator cuff surgery over ten years ago Has appt to see [facility] on Monday Able to lift minimal weight now  No chest pain, no SOB Taking meds regularly  Followed by psychiatry for PTSD Having decreased libido with sertraline, has discussed it with his psychiatrist, plans to change next visit  Hypothyroidism: taking meds regularly  Relevant past medical, surgical, family and social history reviewed. Allergies and medications reviewed and updated. History  Smoking Status  . Former Smoker  . Packs/day: 0.75  . Years: 30.00  . Types: Cigarettes  . Start date: 01/02/1961  . Quit date: 05/31/1987  Smokeless Tobacco  . Never Used   ROS: Per HPI   Objective:    BP 121/81   Pulse 74   Temp 98.2 F (36.8 C) (Oral)   Ht 5' 8" (1.727 m)   Wt 189 lb 3.2 oz (85.8 kg)   BMI 28.77 kg/m   Wt Readings from Last 3 Encounters:  03/16/16 189 lb 3.2 oz (85.8 kg)  02/25/16 191 lb (86.6 kg)  02/08/16 190 lb (86.2 kg)    Gen: NAD, alert, cooperative with exam, NCAT EYES: EOMI, no conjunctival injection, or no icterus CV: NRRR, normal S1/S2  Resp: CTABL, no wheezes, normal WOB Ext: No edema, warm Neuro: Alert and oriented MSK:  L shoulder normal to inspection, can raise arms over head, TTP over biceps groove, posterior shoulder soft tissue.   Assessment & Plan:  Eduardo [name] was seen today for medication check and shoulder pain.  Diagnoses and all orders for this visit:  Essential hypertension Well controlled today, cont current meds -     BMP8+EGFR  Type 2 diabetes mellitus without complication, without long-term current use of insulin (HCC) Due for A1c Cont metformin -     Bayer DCA Hb A1c  Waived  Hypothyroidism, unspecified type Cont levothyroxine, check TSH -     TSH  Chronic left shoulder pain appt to see [facility] next week  Follow up plan: 3 month Assunta Found, MD Eduardo [name]: K elevated at 5.6 Comeback for redraw, spoke with wife, has not been high before Cr at baseline, 0.84  ADDENDUM 03/30/2017: Request for surgical clearance from ortho Called pt to discuss risk, has ischemic heart disease, s/p CABG, no current chest pain Also with DM, not on insulin RCRI risk of intraoperative event 2.4% Pt wants to proceed with surgery which I agree to be very reasonable

## 2016-03-17 LAB — BMP8+EGFR
BUN/Creatinine Ratio: 31 — ABNORMAL HIGH (ref 10–24)
BUN: 26 mg/dL (ref 8–27)
CALCIUM: 10.1 mg/dL (ref 8.6–10.2)
CO2: 29 mmol/L (ref 18–29)
CREATININE: 0.84 mg/dL (ref 0.76–1.27)
Chloride: 101 mmol/L (ref 96–106)
GFR calc Af Amer: 102 mL/min/{1.73_m2} (ref >59)
GFR calc non Af Amer: 88 mL/min/{1.73_m2} (ref >59)
GLUCOSE: 104 mg/dL — AB (ref 65–99)
Potassium: 5.6 mmol/L — ABNORMAL HIGH (ref 3.5–5.2)
Sodium: 143 mmol/L (ref 134–144)

## 2016-03-17 LAB — TSH: TSH: 2.1 u[IU]/mL (ref 0.450–4.500)

## 2016-03-18 NOTE — Addendum Note (Signed)
Addended by: [name] on: 03/18/2016 03:45 PM   Modules accepted: Orders

## 2016-03-21 ENCOUNTER — Other Ambulatory Visit (INDEPENDENT_AMBULATORY_CARE_PROVIDER_SITE_OTHER): Payer: Medicare Other

## 2016-03-21 DIAGNOSIS — M7541 Impingement syndrome of right shoulder: Secondary | ICD-10-CM | POA: Diagnosis not present

## 2016-03-21 DIAGNOSIS — E875 Hyperkalemia: Secondary | ICD-10-CM | POA: Diagnosis not present

## 2016-03-21 DIAGNOSIS — M12812 Other specific arthropathies, not elsewhere classified, left shoulder: Secondary | ICD-10-CM | POA: Diagnosis not present

## 2016-03-21 DIAGNOSIS — M25511 Pain in right shoulder: Secondary | ICD-10-CM | POA: Diagnosis not present

## 2016-03-21 LAB — BMP8+EGFR
BUN/Creatinine Ratio: 29 — ABNORMAL HIGH (ref 10–24)
BUN: 20 mg/dL (ref 8–27)
CHLORIDE: 105 mmol/L (ref 96–106)
CO2: 23 mmol/L (ref 18–29)
Calcium: 9.8 mg/dL (ref 8.6–10.2)
Creatinine, Ser: 0.69 mg/dL — ABNORMAL LOW (ref 0.76–1.27)
GFR calc Af Amer: 110 mL/min/{1.73_m2} (ref >59)
GFR calc non Af Amer: 96 mL/min/{1.73_m2} (ref >59)
GLUCOSE: 120 mg/dL — AB (ref 65–99)
POTASSIUM: 4.8 mmol/L (ref 3.5–5.2)
SODIUM: 144 mmol/L (ref 134–144)

## 2016-03-30 DIAGNOSIS — M25511 Pain in right shoulder: Secondary | ICD-10-CM | POA: Diagnosis not present

## 2016-04-04 ENCOUNTER — Other Ambulatory Visit: Payer: Self-pay | Admitting: Pediatrics

## 2016-04-04 DIAGNOSIS — M25511 Pain in right shoulder: Secondary | ICD-10-CM | POA: Diagnosis not present

## 2016-04-04 DIAGNOSIS — M75122 Complete rotator cuff tear or rupture of left shoulder, not specified as traumatic: Secondary | ICD-10-CM | POA: Diagnosis not present

## 2016-04-12 ENCOUNTER — Encounter (HOSPITAL_COMMUNITY)
Admission: RE | Admit: 2016-04-12 | Discharge: 2016-04-12 | Disposition: A | Discharge status: Home / Self Care | Payer: Medicare Other | Source: Ambulatory Visit | Attending: Orthopedic Surgery | Admitting: Orthopedic Surgery

## 2016-04-14 ENCOUNTER — Inpatient Hospital Stay: Admit: 2016-04-14 | Payer: TRICARE For Life (TFL) | Admitting: Orthopedic Surgery

## 2016-04-14 SURGERY — ARTHROPLASTY, SHOULDER, TOTAL, REVERSE
Anesthesia: General | Site: Shoulder | Laterality: Left

## 2016-04-19 DIAGNOSIS — G8918 Other acute postprocedural pain: Secondary | ICD-10-CM | POA: Diagnosis not present

## 2016-04-19 DIAGNOSIS — S43432A Superior glenoid labrum lesion of left shoulder, initial encounter: Secondary | ICD-10-CM | POA: Diagnosis not present

## 2016-04-19 DIAGNOSIS — M24112 Other articular cartilage disorders, left shoulder: Secondary | ICD-10-CM | POA: Diagnosis not present

## 2016-04-19 DIAGNOSIS — M7542 Impingement syndrome of left shoulder: Secondary | ICD-10-CM | POA: Diagnosis not present

## 2016-04-19 DIAGNOSIS — M75122 Complete rotator cuff tear or rupture of left shoulder, not specified as traumatic: Secondary | ICD-10-CM | POA: Diagnosis not present

## 2016-04-19 DIAGNOSIS — M7522 Bicipital tendinitis, left shoulder: Secondary | ICD-10-CM | POA: Diagnosis not present

## 2016-04-25 ENCOUNTER — Other Ambulatory Visit: Payer: Self-pay | Admitting: Cardiovascular Disease

## 2016-04-27 DIAGNOSIS — M75122 Complete rotator cuff tear or rupture of left shoulder, not specified as traumatic: Secondary | ICD-10-CM | POA: Diagnosis not present

## 2016-04-27 DIAGNOSIS — Z4789 Encounter for other orthopedic aftercare: Secondary | ICD-10-CM | POA: Diagnosis not present

## 2016-05-12 ENCOUNTER — Encounter: Payer: Self-pay | Admitting: Pediatrics

## 2016-05-12 ENCOUNTER — Ambulatory Visit (INDEPENDENT_AMBULATORY_CARE_PROVIDER_SITE_OTHER): Payer: Medicare Other | Admitting: Pediatrics

## 2016-05-12 VITALS — BP 139/83 | HR 70 | Temp 97.4°F | Ht 68.0 in | Wt 192.2 lb

## 2016-05-12 DIAGNOSIS — I6523 Occlusion and stenosis of bilateral carotid arteries: Secondary | ICD-10-CM | POA: Diagnosis not present

## 2016-05-12 DIAGNOSIS — I25119 Atherosclerotic heart disease of native coronary artery with unspecified angina pectoris: Secondary | ICD-10-CM | POA: Diagnosis not present

## 2016-05-12 DIAGNOSIS — Z6829 Body mass index (BMI) 29.0-29.9, adult: Secondary | ICD-10-CM | POA: Diagnosis not present

## 2016-05-12 DIAGNOSIS — E119 Type 2 diabetes mellitus without complications: Secondary | ICD-10-CM | POA: Diagnosis not present

## 2016-05-12 DIAGNOSIS — I10 Essential (primary) hypertension: Secondary | ICD-10-CM

## 2016-05-12 NOTE — Progress Notes (Signed)
  Subjective:   Patient ID: Eduardo [name], male    DOB: 06/28/1945, 70 y.o.   MRN: [id number] CC: Follow-up (4 months) multiple med problems HPI: Eduardo [name] is a 70 y.o. male presenting for Follow-up (4 months)  Had L surgery shoulder Going to start PT in a couple of weeks  HTN: no CP, no SOB, taking meds regularly  DM2: on metformin  Hand surgery and R shoulder: planned for upcoming calendar year per pt  BMI elevated: Walked 2 miles yesterday No CP Trying to increase physical activity  Relevant past medical, surgical, family and social history reviewed. Allergies and medications reviewed and updated. History  Smoking Status  . Former Smoker  . Packs/day: 0.75  . Years: 30.00  . Types: Cigarettes  . Start date: 01/02/1961  . Quit date: 05/31/1987  Smokeless Tobacco  . Never Used   ROS: Per HPI   Objective:    BP 139/83   Pulse 70   Temp 97.4 F (36.3 C) (Oral)   Ht 5\' 8"  (1.727 m)   Wt 192 lb 3.2 oz (87.2 kg)   BMI 29.22 kg/m   Wt Readings from Last 3 Encounters:  05/12/16 192 lb 3.2 oz (87.2 kg)  03/16/16 189 lb 3.2 oz (85.8 kg)  02/25/16 191 lb (86.6 kg)   Gen: NAD, alert, cooperative with exam, NCAT EYES: EOMI, no conjunctival injection, or no icterus CV: NRRR, normal S1/S2 Resp: CTABL, no wheezes, normal WOB Ext: No edema, warm Neuro: Alert and oriented MSK: normal muscle bulk  Assessment & Plan:  Eduardo [name] was seen today for follow-up.  Diagnoses and all orders for this visit:  Coronary artery disease involving native coronary artery of native heart with angina pectoris (HCC) Stable, cont asa, plavix, BB  Essential hypertension Adequate control today, usually lower at home Cont meds Cont to follow at home, let me know if elevated  Type 2 diabetes mellitus without complication, without long-term current use of insulin (HCC) A1c < 7 last check Cont metformin  BMI 29.0-29.9,adult Cont walking, no CP with exertion  Follow up  plan: Return in about 3 months (around 08/10/2016). Assunta Found, MD [place]

## 2016-05-19 ENCOUNTER — Encounter: Payer: Self-pay | Admitting: Pediatrics

## 2016-05-19 ENCOUNTER — Ambulatory Visit (INDEPENDENT_AMBULATORY_CARE_PROVIDER_SITE_OTHER): Payer: Medicare Other | Admitting: Pediatrics

## 2016-05-19 VITALS — BP 139/86 | HR 77 | Ht 68.0 in | Wt 191.4 lb

## 2016-05-19 DIAGNOSIS — I25119 Atherosclerotic heart disease of native coronary artery with unspecified angina pectoris: Secondary | ICD-10-CM | POA: Diagnosis not present

## 2016-05-19 DIAGNOSIS — I10 Essential (primary) hypertension: Secondary | ICD-10-CM

## 2016-05-19 DIAGNOSIS — I6523 Occlusion and stenosis of bilateral carotid arteries: Secondary | ICD-10-CM | POA: Diagnosis not present

## 2016-05-19 DIAGNOSIS — R040 Epistaxis: Secondary | ICD-10-CM

## 2016-05-19 NOTE — Progress Notes (Signed)
  Subjective:   Patient ID: Eduardo [name], male    DOB: 14-Oct-1945, 70 y.o.   MRN: [id number] CC: Epistaxis  HPI: Eduardo [name] is a 70 y.o. male presenting for Epistaxis  Woke up this morning feeling normal Started making breakfast had sudden nose bleed Put several tissues up in his nose, bled through them Took BP, was [id number] systolic, then checked again in a few minutes was 170s No HA, no CP, no SOB Did not take his meds this morning Does not usually have nose bleeds On ASA, plavix for CAD  Relevant past medical, surgical, family and social history reviewed. Allergies and medications reviewed and updated. History  Smoking Status  . Former Smoker  . Packs/day: 0.75  . Years: 30.00  . Types: Cigarettes  . Start date: 01/02/1961  . Quit date: 05/31/1987  Smokeless Tobacco  . Never Used   ROS: Per HPI   Objective:    BP 139/86   Pulse 77   Ht 5\' 8"  (1.727 m)   Wt 191 lb 6.4 oz (86.8 kg)   BMI 29.10 kg/m   Wt Readings from Last 3 Encounters:  05/19/16 191 lb 6.4 oz (86.8 kg)  05/12/16 192 lb 3.2 oz (87.2 kg)  03/16/16 189 lb 3.2 oz (85.8 kg)    Gen: NAD, alert, cooperative with exam, NCAT EYES: EOMI, no conjunctival injection, or no icterus ENT:  Red irritation b/l middle septum inside nares LYMPH: no cervical LAD CV: NRRR, normal S1/S2 Resp: CTABL, no wheezes, normal WOB Ext: No edema, warm Neuro: Alert and oriented, strength equal b/l UE and LE, normal gait MSK: normal muscle bulk  Assessment & Plan:  Eduardo [name] was seen today for epistaxis. Put ointment in nares, showed pt how to hold pressure. Bleeding stopped within a few minutes. Bp came down with recheck here. Has not yet taken meds at home. On ASA/plavix. No other recent nose bleeds. Difficult to assess for blood vessels with clot now in place I am hesitant to disturb it. Discussed care for future nosebleeds, RTC if continues to have frequent bleeds.  Diagnoses and all orders for this  visit:  Epistaxis See above  HTN Slightly elevated from baseline here Take meds at home  CAD Stable, no CP Cont ASA, plavix.  Follow up plan: As scheduled Assunta Found, MD [place]

## 2016-05-25 ENCOUNTER — Ambulatory Visit: Payer: Medicare Other | Attending: Orthopedic Surgery | Admitting: Physical Therapy

## 2016-05-25 DIAGNOSIS — M25612 Stiffness of left shoulder, not elsewhere classified: Secondary | ICD-10-CM | POA: Insufficient documentation

## 2016-05-25 DIAGNOSIS — M25511 Pain in right shoulder: Secondary | ICD-10-CM | POA: Insufficient documentation

## 2016-05-25 DIAGNOSIS — G8929 Other chronic pain: Secondary | ICD-10-CM | POA: Diagnosis not present

## 2016-05-25 NOTE — Therapy (Signed)
[facility] [place], [place], [address] Phone: [phone]   Fax:  [phone]  Physical Therapy Evaluation  Patient Details  Name: Eduardo [name] MRN: [id number] Date of Birth: 1946/01/07 Referring Provider: Justice Britain MD  Encounter Date: 05/25/2016      PT End of Session - 05/25/16 1641    Visit Number 1   Number of Visits 16   Date for PT Re-Evaluation 07/24/16   PT Start Time 0233   PT Stop Time 0307   PT Time Calculation (min) 34 min   Activity Tolerance Patient tolerated treatment well   Behavior During Therapy [facility] for tasks assessed/performed      Past Medical History:  Diagnosis Date  . Bilateral carotid artery disease ([place]) 04/28/2015   1-39 percent bilateral stenosis noted on Doppler   . CAD (coronary artery disease)   . Cervical disc disease   . Chemical exposure    agent orange   . COPD (chronic obstructive pulmonary disease) ([place])   . Diabetes mellitus without complication ([place])   . Diverticulitis   . Hypertension   . Lumbar disc disease   . Obesity (BMI 30-39.9)   . PTSD (post-traumatic stress disorder)   . Sleep apnea     Past Surgical History:  Procedure Laterality Date  . CARDIAC CATHETERIZATION    . CARDIAC CATHETERIZATION  03/18/14   difficult to determine culprit vesel  . CARDIAC CATHETERIZATION N/A 04/29/2015   Procedure: Left Heart Cath and Coronary Angiography;  Surgeon: [name], MD;  Location: [place] [facility];  Service: Cardiovascular;  Laterality: N/A;  . CERVICAL SPINE SURGERY    . CHOLECYSTECTOMY    . CORONARY ARTERY BYPASS GRAFT N/A 05/04/2015   Procedure: CORONARY ARTERY BYPASS GRAFTING (CABG)  x four,  using left internal mammary artery, and bilateral thigh greater saphenous veins;  Surgeon: [name], MD;  Location: [place];  Service: Open Heart Surgery;  Laterality: N/A;  . EYE SURGERY Right    cataract  . I&D EXTREMITY Left 05/30/2013    Procedure: IRRIGATION AND DEBRIDEMENT Left Hand and Foreign body removal;  Surgeon: [name], MD;  Location: [place];  Service: Orthopedics;  Laterality: Left;  . LEFT HEART CATHETERIZATION WITH CORONARY ANGIOGRAM N/A 03/18/2014   Procedure: LEFT HEART CATHETERIZATION WITH CORONARY ANGIOGRAM;  Surgeon: [name], MD;  Location: [facility] CATH LAB;  Service: Cardiovascular;  Laterality: N/A;  . LUMBAR LAMINECTOMY    . PARTIAL COLECTOMY     For diverticulitis  . PTCA     unsuccesful  . REPLACEMENT TOTAL KNEE     right   . SHOULDER ARTHROSCOPY    . SHOULDER SURGERY Bilateral   . TEE WITHOUT CARDIOVERSION N/A 05/04/2015   Procedure: TRANSESOPHAGEAL ECHOCARDIOGRAM (TEE);  Surgeon: [name], MD;  Location: [place];  Service: Open Heart Surgery;  Laterality: N/A;    There were no vitals filed for this visit.       Subjective Assessment - 05/25/16 1638    Subjective The patient underwent a left RTC surgery on 04/19/16.  He states he is only to be doing the pendulum exercise at this point but admits to doing a home pulley system (flexion and abduction) and table slides.  He is reporting a pain-level of 3-4/10. He is wearing a sling today.    Patient Stated Goals Use my arm without pain.   Currently in Pain? Yes   Pain Score 4    Pain  Location Shoulder   Pain Orientation Left   Pain Descriptors / Indicators Aching   Pain Type Chronic pain;Surgical pain   Pain Onset More than a month ago   Pain Frequency Intermittent   Aggravating Factors  Certain movements.   Pain Relieving Factors Rest.            [facility] PT Assessment - 05/25/16 0001      Assessment   Medical Diagnosis Left rotator cuff repair.   Referring Provider Justice Britain MD   Onset Date/Surgical Date --  04/19/16(surgery date).     Precautions   Precautions --  04/19/16 (surgery date).   Required Braces or Orthoses --  Left shoulder sling with abduction pillow.     Balance Screen   Has the patient fallen  in the past 6 months No   Has the patient had a decrease in activity level because of a fear of falling?  No   Is the patient reluctant to leave their home because of a fear of falling?  No     Home Environment   Living Environment Private residence     Prior Function   Level of Independence Independent     Observation/Other Assessments   Observations left shoulder incisional sites look good.  Minimal left bicep deformation observed.     Posture/Postural Control   Posture/Postural Control No significant limitations     ROM / Strength   AROM / PROM / Strength PROM     PROM   Overall PROM Comments Left shoulder passive range of motion in supine into flexion= 120 degrees; ER= 66 degrees and IR to 47 degrees.     Palpation   Palpation comment Mild left anterior shoulder pain and near acromial region.     Ambulation/Gait   Gait Comments WNL.                   [facility] Adult PT Treatment/Exercise - 05/25/16 0001      Manual Therapy   Manual Therapy Passive ROM   Passive ROM Left shoulder PROM in supine into flexion and ER= 10 degrees.                  PT Short Term Goals - 05/25/16 1707      PT SHORT TERM GOAL #1   Title STG's=LTG's.           PT Long Term Goals - 05/25/16 1707      PT LONG TERM GOAL #1   Title Ind with advanced HEP.   Time 8   Period Weeks   Status New     PT LONG TERM GOAL #2   Title Active left shoulder flexion to 155 degrees so the patient can easily reach overhead   Time 8   Period Weeks   Status New     PT LONG TERM GOAL #3   Title Perform ADL's with pain not > 3/10.   Time 8   Period Weeks   Status New     PT LONG TERM GOAL #4   Title Active ER to 80 degrees+ to allow for easily donning/doffing of apparel.   Time 8   Period Weeks   Status New     PT LONG TERM GOAL #5   Title Increase right shoulder strength to a solid 4+/5 to increase stability for performance of functional activities   Time 8   Period  Weeks   Status New  Plan - 05/25/16 1644    Clinical Impression Statement The patient's is lacking right shoulder range of motion but appears to be well head of schedule.  There was minimal left biceps deformation unto observation.  patient has been using a home pulley system at home though he admits his surgeon told him to do the pendulum exercise at this point.     Rehab Potential Excellent   PT Frequency 3x / week   PT Duration 4 weeks   PT Treatment/Interventions ADLs/Self Care Home Management;Cryotherapy;Electrical Stimulation;Moist Heat;Ultrasound;Therapeutic activities;Therapeutic exercise;Neuromuscular re-education;Patient/family education;Manual techniques;Passive range of motion   PT Next Visit Plan Right shoulder PROM and rhy stabs; begin AAROM at 6 weeks post-op; modalities as needed.   Consulted and Agree with Plan of Care Patient      Patient will benefit from skilled therapeutic intervention in order to improve the following deficits and impairments:  Pain, Decreased activity tolerance, Decreased range of motion  Visit Diagnosis: Acute pain of right shoulder - Plan: PT plan of care cert/re-cert  Stiffness of left shoulder, not elsewhere classified - Plan: PT plan of care cert/re-cert      G-Codes - [id number]    Functional Assessment Tool Used Clinical judgement...   Functional Limitation Mobility: Walking and moving around   Mobility: Walking and Moving Around Current Status [phone]) At least 60 percent but less than 80 percent impaired, limited or restricted   Mobility: Walking and Moving Around Goal Status [phone]) At least 1 percent but less than 20 percent impaired, limited or restricted       Problem List Patient Active Problem List   Diagnosis Date Noted  . Chest pain 09/26/2015  . Elevated TSH 05/08/2015  . S/P CABG x 4   . Coronary artery disease involving native coronary artery of native heart with unstable angina pectoris ([place])    . Bilateral carotid artery disease ([place]) 04/28/2015  . BMI 28.0-28.9,adult   . Unstable angina ([place]) 03/18/2014  . Type 2 diabetes mellitus without complication, without long-term current use of insulin ([place]) 09/21/2013  . COPD (chronic obstructive pulmonary disease) ([place]) 09/21/2013  . PTSD (post-traumatic stress disorder) 09/21/2013  . CAD (coronary artery disease) 04/16/2013  . HTN (hypertension) 04/16/2013  . Hyperlipidemia 04/16/2013    Camira Geidel, [place] MPT 05/25/2016, 5:17 PM  [facility] [address] [place], [place], [address] Phone: [phone]   Fax:  [phone]  Name: LEGEND KOLODZIEJ MRN: [id number] Date of Birth: [birth year]

## 2016-05-26 ENCOUNTER — Encounter: Payer: Self-pay | Admitting: Physical Therapy

## 2016-05-26 ENCOUNTER — Ambulatory Visit: Payer: Medicare Other | Admitting: Physical Therapy

## 2016-05-26 DIAGNOSIS — M25511 Pain in right shoulder: Secondary | ICD-10-CM

## 2016-05-26 DIAGNOSIS — M25612 Stiffness of left shoulder, not elsewhere classified: Secondary | ICD-10-CM

## 2016-05-26 NOTE — Therapy (Signed)
[facility] [place], [place], [address] Phone: [phone]   Fax:  [phone]  Physical Therapy Treatment  Patient Details  Name: Eduardo [name] MRN: [id number] Date of Birth: [birth year] Referring Provider: Justice Britain MD  Encounter Date: 05/26/2016      PT End of Session - 05/26/16 0732    Visit Number 2   Number of Visits 16   Date for PT Re-Evaluation 07/24/16   PT Start Time 0732   PT Stop Time 0816   PT Time Calculation (min) 44 min   Activity Tolerance Patient tolerated treatment well   Behavior During Therapy [facility] for tasks assessed/performed      Past Medical History:  Diagnosis Date  . Bilateral carotid artery disease ([place]) 04/28/2015   1-39 percent bilateral stenosis noted on Doppler   . CAD (coronary artery disease)   . Cervical disc disease   . Chemical exposure    agent orange   . COPD (chronic obstructive pulmonary disease) ([place])   . Diabetes mellitus without complication ([place])   . Diverticulitis   . Hypertension   . Lumbar disc disease   . Obesity (BMI 30-39.9)   . PTSD (post-traumatic stress disorder)   . Sleep apnea     Past Surgical History:  Procedure Laterality Date  . CARDIAC CATHETERIZATION    . CARDIAC CATHETERIZATION  03/18/14   difficult to determine culprit vesel  . CARDIAC CATHETERIZATION N/A 04/29/2015   Procedure: Left Heart Cath and Coronary Angiography;  Surgeon: [name], MD;  Location: [place] [facility];  Service: Cardiovascular;  Laterality: N/A;  . CERVICAL SPINE SURGERY    . CHOLECYSTECTOMY    . CORONARY ARTERY BYPASS GRAFT N/A 05/04/2015   Procedure: CORONARY ARTERY BYPASS GRAFTING (CABG)  x four,  using left internal mammary artery, and bilateral thigh greater saphenous veins;  Surgeon: [name], MD;  Location: [place];  Service: Open Heart Surgery;  Laterality: N/A;  . EYE SURGERY Right    cataract  . I&D EXTREMITY Left 05/30/2013    Procedure: IRRIGATION AND DEBRIDEMENT Left Hand and Foreign body removal;  Surgeon: [name], MD;  Location: [place];  Service: Orthopedics;  Laterality: Left;  . LEFT HEART CATHETERIZATION WITH CORONARY ANGIOGRAM N/A 03/18/2014   Procedure: LEFT HEART CATHETERIZATION WITH CORONARY ANGIOGRAM;  Surgeon: [name], MD;  Location: [facility] C[facility];  Service: Cardiovascular;  Laterality: N/A;  . LUMBAR LAMINECTOMY    . PARTIAL COLECTOMY     For diverticulitis  . PTCA     unsuccesful  . REPLACEMENT TOTAL KNEE     right   . SHOULDER ARTHROSCOPY    . SHOULDER SURGERY Bilateral   . TEE WITHOUT CARDIOVERSION N/A 05/04/2015   Procedure: TRANSESOPHAGEAL ECHOCARDIOGRAM (TEE);  Surgeon: [name], MD;  Location: [place];  Service: Open Heart Surgery;  Laterality: N/A;    There were no vitals filed for this visit.      Subjective Assessment - 05/26/16 0731    Subjective Reports that his shoulder is okay this morning. Reports that he began using home pulley system two weeks after surgery as MD did not want him starting PT yet but patient began pulleys on his own.   Patient Stated Goals Use my arm without pain.   Currently in Pain? Yes   Pain Score 2    Pain Location Shoulder   Pain Orientation Left   Pain Descriptors / Indicators Tender   Pain Type  Surgical pain   Pain Onset More than a month ago   Pain Frequency Intermittent   Aggravating Factors  Certain movements   Pain Relieving Factors Rest            [facility] PT Assessment - 05/26/16 0001      Assessment   Medical Diagnosis Left rotator cuff repair.   Onset Date/Surgical Date 04/19/16   Next MD Visit 06/22/2016                     [facility] Adult PT Treatment/Exercise - 05/26/16 0001      Exercises   Exercises Shoulder     Modalities   Modalities Electrical Stimulation     Electrical Stimulation   Electrical Stimulation Location L shoulder   Electrical Stimulation Action Pre-Mod   Electrical  Stimulation Parameters 80-150 hz x15 min   Electrical Stimulation Goals Edema     Manual Therapy   Manual Therapy Passive ROM   Passive ROM PROM of L shoulder into flex/ER/IR with gentle holds at end range                  PT Short Term Goals - 05/25/16 1707      PT SHORT TERM GOAL #1   Title STG's=LTG's.           PT Long Term Goals - 05/25/16 1707      PT LONG TERM GOAL #1   Title Ind with advanced HEP.   Time 8   Period Weeks   Status New     PT LONG TERM GOAL #2   Title Active left shoulder flexion to 155 degrees so the patient can easily reach overhead   Time 8   Period Weeks   Status New     PT LONG TERM GOAL #3   Title Perform ADL's with pain not > 3/10.   Time 8   Period Weeks   Status New     PT LONG TERM GOAL #4   Title Active ER to 80 degrees+ to allow for easily donning/doffing of apparel.   Time 8   Period Weeks   Status New     PT LONG TERM GOAL #5   Title Increase right shoulder strength to a solid 4+/5 to increase stability for performance of functional activities   Time 8   Period Weeks   Status New               Plan - 05/26/16 0802    Clinical Impression Statement Patient arrived to treatment with low level L shoulder tenderness intermittantly. Patient compliant with L shoulder sling but reports that is only due to the MD and abduction pillow was not in place today. Firm end feels noted with PROM of L shoulder into each direction assessed. PROM range ahead of schedule with smooth arc of motion and only report of quick end range discomfort with PROM into flexion. Normal modalities response noted following removal of the modalities. Goals remain on-going secondary to current protocol.   Rehab Potential Excellent   PT Frequency 3x / week   PT Duration 4 weeks   PT Treatment/Interventions ADLs/Self Care Home Management;Cryotherapy;Electrical Stimulation;Moist Heat;Ultrasound;Therapeutic activities;Therapeutic  exercise;Neuromuscular re-education;Patient/family education;Manual techniques;Passive range of motion   PT Next Visit Plan Continue with PROM of L shoulder until 6 weeks post surgery when AAROM exercises can be initiated per MPT POC.   Consulted and Agree with Plan of Care Patient      Patient will  benefit from skilled therapeutic intervention in order to improve the following deficits and impairments:  Pain, Decreased activity tolerance, Decreased range of motion  Visit Diagnosis: Acute pain of right shoulder  Stiffness of left shoulder, not elsewhere classified       G-Codes - 05/28/2016 1642    Functional Assessment Tool Used Clinical judgement...   Functional Limitation Mobility: Walking and moving around   Mobility: Walking and Moving Around Current Status [phone]) At least 60 percent but less than 80 percent impaired, limited or restricted   Mobility: Walking and Moving Around Goal Status [phone]) At least 1 percent but less than 20 percent impaired, limited or restricted      Problem List Patient Active Problem List   Diagnosis Date Noted  . Chest pain 09/26/2015  . Elevated TSH 05/08/2015  . S/P CABG x 4   . Coronary artery disease involving native coronary artery of native heart with unstable angina pectoris ([place])   . Bilateral carotid artery disease ([place]) 04/28/2015  . BMI 28.0-28.9,adult   . Unstable angina ([place]) 03/18/2014  . Type 2 diabetes mellitus without complication, without long-term current use of insulin ([place]) 09/21/2013  . COPD (chronic obstructive pulmonary disease) ([place]) 09/21/2013  . PTSD (post-traumatic stress disorder) 09/21/2013  . CAD (coronary artery disease) 04/16/2013  . HTN (hypertension) 04/16/2013  . Hyperlipidemia 04/16/2013    Wynelle Fanny, PTA 05/26/2016, 8:21 AM  [facility] [address] [place], [place], [address] Phone: [phone]   Fax:  [phone]  Name: Eduardo [name] MRN: [id number] Date of Birth: 1946/04/29

## 2016-05-31 ENCOUNTER — Encounter: Payer: Self-pay | Admitting: Physical Therapy

## 2016-05-31 ENCOUNTER — Ambulatory Visit: Payer: Medicare Other | Attending: Orthopedic Surgery | Admitting: Physical Therapy

## 2016-05-31 DIAGNOSIS — M25612 Stiffness of left shoulder, not elsewhere classified: Secondary | ICD-10-CM

## 2016-05-31 DIAGNOSIS — M25511 Pain in right shoulder: Secondary | ICD-10-CM | POA: Diagnosis not present

## 2016-05-31 NOTE — Therapy (Signed)
[facility] [place], [place], [address] Phone: [phone]   Fax:  [phone]  Physical Therapy Treatment  Patient Details  Name: Eduardo [name] MRN: [id number] Date of Birth: 1945/11/30 Referring Provider: Justice Britain MD  Encounter Date: 05/31/2016      PT End of Session - 05/31/16 0732    Visit Number 3   Number of Visits 16   Date for PT Re-Evaluation 07/24/16   PT Start Time 0732   PT Stop Time 0813   PT Time Calculation (min) 41 min   Activity Tolerance Patient tolerated treatment well   Behavior During Therapy [facility] for tasks assessed/performed      Past Medical History:  Diagnosis Date  . Bilateral carotid artery disease ([name]) 04/28/2015   1-39 percent bilateral stenosis noted on Doppler   . CAD (coronary artery disease)   . Cervical disc disease   . Chemical exposure    agent orange   . COPD (chronic obstructive pulmonary disease) ([place])   . Diabetes mellitus without complication ([place])   . Diverticulitis   . Hypertension   . Lumbar disc disease   . Obesity (BMI 30-39.9)   . PTSD (post-traumatic stress disorder)   . Sleep apnea     Past Surgical History:  Procedure Laterality Date  . CARDIAC CATHETERIZATION    . CARDIAC CATHETERIZATION  03/18/14   difficult to determine culprit vesel  . CARDIAC CATHETERIZATION N/A 04/29/2015   Procedure: Left Heart Cath and Coronary Angiography;  Surgeon: [name], MD;  Location: [place] CV [facility];  Service: Cardiovascular;  Laterality: N/A;  . CERVICAL SPINE SURGERY    . CHOLECYSTECTOMY    . CORONARY ARTERY BYPASS GRAFT N/A 05/04/2015   Procedure: CORONARY ARTERY BYPASS GRAFTING (CABG)  x four,  using left internal mammary artery, and bilateral thigh greater saphenous veins;  Surgeon: [name], MD;  Location: [place];  Service: Open Heart Surgery;  Laterality: N/A;  . EYE SURGERY Right    cataract  . I&D EXTREMITY Left 05/30/2013   Procedure: IRRIGATION AND DEBRIDEMENT Left Hand and Foreign body removal;  Surgeon: [name], MD;  Location: [place];  Service: Orthopedics;  Laterality: Left;  . LEFT HEART CATHETERIZATION WITH CORONARY ANGIOGRAM N/A 03/18/2014   Procedure: LEFT HEART CATHETERIZATION WITH CORONARY ANGIOGRAM;  Surgeon: [name], MD;  Location: [facility] CATH LAB;  Service: Cardiovascular;  Laterality: N/A;  . LUMBAR LAMINECTOMY    . PARTIAL COLECTOMY     For diverticulitis  . PTCA     unsuccesful  . REPLACEMENT TOTAL KNEE     right   . SHOULDER ARTHROSCOPY    . SHOULDER SURGERY Bilateral   . TEE WITHOUT CARDIOVERSION N/A 05/04/2015   Procedure: TRANSESOPHAGEAL ECHOCARDIOGRAM (TEE);  Surgeon: [name], MD;  Location: [place];  Service: Open Heart Surgery;  Laterality: N/A;    There were no vitals filed for this visit.      Subjective Assessment - 05/31/16 0732    Subjective No new complaints.   Patient Stated Goals Use my arm without pain.   Currently in Pain? Yes   Pain Score 3    Pain Location Shoulder   Pain Orientation Left   Pain Descriptors / Indicators Tender   Pain Type Surgical pain   Pain Onset More than a month ago   Pain Frequency Intermittent   Aggravating Factors  Certain movements  [facility] PT Assessment - 05/31/16 0001      Assessment   Medical Diagnosis Left rotator cuff repair.   Onset Date/Surgical Date 04/19/16   Next MD Visit 06/22/2016     ROM / Strength   AROM / PROM / Strength PROM     PROM   Overall PROM  Deficits;Within functional limits for tasks performed   PROM Assessment Site Shoulder   Right/Left Shoulder Left   Left Shoulder Flexion 153 Degrees   Left Shoulder Internal Rotation 70 Degrees   Left Shoulder External Rotation 71 Degrees                     [facility] Adult PT Treatment/Exercise - 05/31/16 0001      Modalities   Modalities Electrical Stimulation     Electrical Stimulation   Electrical Stimulation  Location L shoulder   Electrical Stimulation Action IFC   Electrical Stimulation Parameters 1-10 hz x15 min   Electrical Stimulation Goals Edema     Manual Therapy   Manual Therapy Passive ROM   Passive ROM PROM of L shoulder into flex/ER/IR with gentle holds at end range                  PT Short Term Goals - 05/25/16 1707      PT SHORT TERM GOAL #1   Title STG's=LTG's.           PT Long Term Goals - 05/25/16 1707      PT LONG TERM GOAL #1   Title Ind with advanced HEP.   Time 8   Period Weeks   Status New     PT LONG TERM GOAL #2   Title Active left shoulder flexion to 155 degrees so the patient can easily reach overhead   Time 8   Period Weeks   Status New     PT LONG TERM GOAL #3   Title Perform ADL's with pain not > 3/10.   Time 8   Period Weeks   Status New     PT LONG TERM GOAL #4   Title Active ER to 80 degrees+ to allow for easily donning/doffing of apparel.   Time 8   Period Weeks   Status New     PT LONG TERM GOAL #5   Title Increase right shoulder strength to a solid 4+/5 to increase stability for performance of functional activities   Time 8   Period Weeks   Status New               Plan - 05/31/16 [id number]    Clinical Impression Statement Patient arrived to treatment with low level L shoulder tenderness that was reported as only intermittant. Patient arrived with L shoulder sling donned but abduction not present. Firm end feels noted with PROM of L shoulder in all directions assessed today. Smooth arc of motion noted with PROM of L shoulder in all directions as well today. PROM measurements taken today as 153 deg flexion, 71 deg ER, 70 deg IR. Normal modalities response noted following removal of the modalities. Goals remain on-going secondary to protocol limitations.   Rehab Potential Excellent   PT Frequency 3x / week   PT Duration 4 weeks   PT Treatment/Interventions ADLs/Self Care Home Management;Cryotherapy;Electrical  Stimulation;Moist Heat;Ultrasound;Therapeutic activities;Therapeutic exercise;Neuromuscular re-education;Patient/family education;Manual techniques;Passive range of motion   PT Next Visit Plan Continue with PROM of L shoulder until 6 weeks post surgery when AAROM exercises can be initiated per MPT  POC.   Consulted and Agree with Plan of Care Patient      Patient will benefit from skilled therapeutic intervention in order to improve the following deficits and impairments:  Pain, Decreased activity tolerance, Decreased range of motion  Visit Diagnosis: Acute pain of right shoulder  Stiffness of left shoulder, not elsewhere classified     Problem List Patient Active Problem List   Diagnosis Date Noted  . Chest pain 09/26/2015  . Elevated TSH 05/08/2015  . S/P CABG x 4   . Coronary artery disease involving native coronary artery of native heart with unstable angina pectoris ([place])   . Bilateral carotid artery disease ([place]) 04/28/2015  . BMI 28.0-28.9,adult   . Unstable angina ([place]) 03/18/2014  . Type 2 diabetes mellitus without complication, without long-term current use of insulin ([place]) 09/21/2013  . COPD (chronic obstructive pulmonary disease) ([place]) 09/21/2013  . PTSD (post-traumatic stress disorder) 09/21/2013  . CAD (coronary artery disease) 04/16/2013  . HTN (hypertension) 04/16/2013  . Hyperlipidemia 04/16/2013    Wynelle Fanny, PTA 05/31/2016, 8:20 AM  [facility] [address] [place], [place], [address] Phone: [phone]   Fax:  [phone]  Name: Eduardo [name] MRN: [id number] Date of Birth: 1945/08/25

## 2016-06-02 ENCOUNTER — Ambulatory Visit: Payer: Medicare Other | Admitting: Physical Therapy

## 2016-06-02 ENCOUNTER — Encounter: Payer: Self-pay | Admitting: Physical Therapy

## 2016-06-02 DIAGNOSIS — M25511 Pain in right shoulder: Secondary | ICD-10-CM | POA: Diagnosis not present

## 2016-06-02 DIAGNOSIS — M25612 Stiffness of left shoulder, not elsewhere classified: Secondary | ICD-10-CM | POA: Diagnosis not present

## 2016-06-02 NOTE — Therapy (Signed)
[facility] [place], [place], [address] Phone: [phone]   Fax:  [phone]  Physical Therapy Treatment  Patient Details  Name: Eduardo [name] MRN: [id number] Date of Birth: Oct 18, 1945 Referring Provider: Justice Britain MD  Encounter Date: 06/02/2016      PT End of Session - 06/02/16 0730    Visit Number 4   Number of Visits 16   Date for PT Re-Evaluation 07/24/16   PT Start Time 0733   PT Stop Time 0815   PT Time Calculation (min) 42 min   Activity Tolerance Patient tolerated treatment well   Behavior During Therapy [facility] for tasks assessed/performed      Past Medical History:  Diagnosis Date  . Bilateral carotid artery disease ([place]) 04/28/2015   1-39 percent bilateral stenosis noted on Doppler   . CAD (coronary artery disease)   . Cervical disc disease   . Chemical exposure    agent orange   . COPD (chronic obstructive pulmonary disease) ([place])   . Diabetes mellitus without complication ([place])   . Diverticulitis   . Hypertension   . Lumbar disc disease   . Obesity (BMI 30-39.9)   . PTSD (post-traumatic stress disorder)   . Sleep apnea     Past Surgical History:  Procedure Laterality Date  . CARDIAC CATHETERIZATION    . CARDIAC CATHETERIZATION  03/18/14   difficult to determine culprit vesel  . CARDIAC CATHETERIZATION N/A 04/29/2015   Procedure: Left Heart Cath and Coronary Angiography;  Surgeon: [name], MD;  Location: [facility];  Service: Cardiovascular;  Laterality: N/A;  . CERVICAL SPINE SURGERY    . CHOLECYSTECTOMY    . CORONARY ARTERY BYPASS GRAFT N/A 05/04/2015   Procedure: CORONARY ARTERY BYPASS GRAFTING (CABG)  x four,  using left internal mammary artery, and bilateral thigh greater saphenous veins;  Surgeon: [name], MD;  Location: [facility];  Service: Open Heart Surgery;  Laterality: N/A;  . EYE SURGERY Right    cataract  . I&D EXTREMITY Left 05/30/2013    Procedure: IRRIGATION AND DEBRIDEMENT Left Hand and Foreign body removal;  Surgeon: [name], MD;  Location: [facility];  Service: Orthopedics;  Laterality: Left;  . LEFT HEART CATHETERIZATION WITH CORONARY ANGIOGRAM N/A 03/18/2014   Procedure: LEFT HEART CATHETERIZATION WITH CORONARY ANGIOGRAM;  Surgeon: [name], MD;  Location: [facility];  Service: Cardiovascular;  Laterality: N/A;  . LUMBAR LAMINECTOMY    . PARTIAL COLECTOMY     For diverticulitis  . PTCA     unsuccesful  . REPLACEMENT TOTAL KNEE     right   . SHOULDER ARTHROSCOPY    . SHOULDER SURGERY Bilateral   . TEE WITHOUT CARDIOVERSION N/A 05/04/2015   Procedure: TRANSESOPHAGEAL ECHOCARDIOGRAM (TEE);  Surgeon: [name], MD;  Location: [facility];  Service: Open Heart Surgery;  Laterality: N/A;    There were no vitals filed for this visit.      Subjective Assessment - 06/02/16 0730    Subjective No new complaints.   Patient Stated Goals Use my arm without pain.   Currently in Pain? No/denies            [facility] PT Assessment - 06/02/16 0001      Assessment   Medical Diagnosis Left rotator cuff repair.   Onset Date/Surgical Date 04/19/16   Next MD Visit 06/22/2016  [facility] Adult PT Treatment/Exercise - 06/02/16 0001      Modalities   Modalities Electrical Stimulation;Moist Heat     Moist Heat Therapy   Number Minutes Moist Heat 15 Minutes   Moist Heat Location Shoulder     Electrical Stimulation   Electrical Stimulation Location L shoulder   Electrical Stimulation Action Pre-Mod   Electrical Stimulation Parameters 80-150 hz x15 min   Electrical Stimulation Goals Edema     Manual Therapy   Manual Therapy Passive ROM   Passive ROM PROM of L shoulder into flex/ER/IR with gentle holds at end range                  PT Short Term Goals - 05/25/16 1707      PT SHORT TERM GOAL #1   Title STG's=LTG's.           PT Long Term Goals - 05/25/16 1707       PT LONG TERM GOAL #1   Title Ind with advanced HEP.   Time 8   Period Weeks   Status New     PT LONG TERM GOAL #2   Title Active left shoulder flexion to 155 degrees so the patient can easily reach overhead   Time 8   Period Weeks   Status New     PT LONG TERM GOAL #3   Title Perform ADL's with pain not > 3/10.   Time 8   Period Weeks   Status New     PT LONG TERM GOAL #4   Title Active ER to 80 degrees+ to allow for easily donning/doffing of apparel.   Time 8   Period Weeks   Status New     PT LONG TERM GOAL #5   Title Increase right shoulder strength to a solid 4+/5 to increase stability for performance of functional activities   Time 8   Period Weeks   Status New               Plan - 06/02/16 [id number]    Clinical Impression Statement Patient arrived to treatment with no complaints in regards to L shoulder with no pain reported upon arrival. Patient again arrived to treatment with sling donned without abduction pillow. Firm end feels noted and smooth arc of motion present during PROM of L shoulder during PROM. Normal modalities response noted following removal of the modalities. No pain or complaints reported during treatment as patient fell asleep during treatment.   Rehab Potential Excellent   PT Frequency 3x / week   PT Duration 4 weeks   PT Treatment/Interventions ADLs/Self Care Home Management;Cryotherapy;Electrical Stimulation;Moist Heat;Ultrasound;Therapeutic activities;Therapeutic exercise;Neuromuscular re-education;Patient/family education;Manual techniques;Passive range of motion   PT Next Visit Plan Continue with PROM of L shoulder until 6 weeks post surgery when AAROM exercises can be initiated per MPT POC.   Consulted and Agree with Plan of Care Patient      Patient will benefit from skilled therapeutic intervention in order to improve the following deficits and impairments:  Pain, Decreased activity tolerance, Decreased range of motion  Visit  Diagnosis: Acute pain of right shoulder  Stiffness of left shoulder, not elsewhere classified     Problem List Patient Active Problem List   Diagnosis Date Noted  . Chest pain 09/26/2015  . Elevated TSH 05/08/2015  . S/P CABG x 4   . Coronary artery disease involving native coronary artery of native heart with unstable angina pectoris ([place])   . Bilateral carotid artery disease ([place])  04/28/2015  . BMI 28.0-28.9,adult   . Unstable angina ([place]) 03/18/2014  . Type 2 diabetes mellitus without complication, without long-term current use of insulin ([place]) 09/21/2013  . COPD (chronic obstructive pulmonary disease) ([place]) 09/21/2013  . PTSD (post-traumatic stress disorder) 09/21/2013  . CAD (coronary artery disease) 04/16/2013  . HTN (hypertension) 04/16/2013  . Hyperlipidemia 04/16/2013    Wynelle Fanny, PTA 06/02/2016, 8:18 AM  [facility] [address] [place], [place], [address] Phone: [phone]   Fax:  [phone]  Name: SHERLOCK CALLERY MRN: [id number] Date of Birth: [birth year]

## 2016-06-06 ENCOUNTER — Ambulatory Visit: Payer: Medicare Other | Admitting: Physical Therapy

## 2016-06-06 ENCOUNTER — Encounter: Payer: Self-pay | Admitting: Physical Therapy

## 2016-06-06 DIAGNOSIS — M25612 Stiffness of left shoulder, not elsewhere classified: Secondary | ICD-10-CM

## 2016-06-06 DIAGNOSIS — M25511 Pain in right shoulder: Secondary | ICD-10-CM

## 2016-06-06 NOTE — Therapy (Signed)
[facility] [place], [place], [address] Phone: [phone]   Fax:  [phone]  Physical Therapy Treatment  Patient Details  Name: Eduardo [name] MRN: [id number] Date of Birth: 1946/02/21 Referring Provider: Justice Britain MD  Encounter Date: 06/06/2016      PT End of Session - 06/06/16 0754    Visit Number 5   Number of Visits 16   Date for PT Re-Evaluation 07/24/16   PT Start Time 0732   PT Stop Time 0817   PT Time Calculation (min) 45 min   Activity Tolerance Patient tolerated treatment well   Behavior During Therapy [facility] for tasks assessed/performed      Past Medical History:  Diagnosis Date  . Bilateral carotid artery disease ([place]) 04/28/2015   1-39 percent bilateral stenosis noted on Doppler   . CAD (coronary artery disease)   . Cervical disc disease   . Chemical exposure    agent orange   . COPD (chronic obstructive pulmonary disease) ([place])   . Diabetes mellitus without complication ([place])   . Diverticulitis   . Hypertension   . Lumbar disc disease   . Obesity (BMI 30-39.9)   . PTSD (post-traumatic stress disorder)   . Sleep apnea     Past Surgical History:  Procedure Laterality Date  . CARDIAC CATHETERIZATION    . CARDIAC CATHETERIZATION  03/18/14   difficult to determine culprit vesel  . CARDIAC CATHETERIZATION N/A 04/29/2015   Procedure: Left Heart Cath and Coronary Angiography;  Surgeon: [name], MD;  Location: [facility];  Service: Cardiovascular;  Laterality: N/A;  . CERVICAL SPINE SURGERY    . CHOLECYSTECTOMY    . CORONARY ARTERY BYPASS GRAFT N/A 05/04/2015   Procedure: CORONARY ARTERY BYPASS GRAFTING (CABG)  x four,  using left internal mammary artery, and bilateral thigh greater saphenous veins;  Surgeon: [name], MD;  Location: [facility];  Service: Open Heart Surgery;  Laterality: N/A;  . EYE SURGERY Right    cataract  . I&D EXTREMITY Left 05/30/2013   Procedure: IRRIGATION AND DEBRIDEMENT Left Hand and Foreign body removal;  Surgeon: [name], MD;  Location: [facility];  Service: Orthopedics;  Laterality: Left;  . LEFT HEART CATHETERIZATION WITH CORONARY ANGIOGRAM N/A 03/18/2014   Procedure: LEFT HEART CATHETERIZATION WITH CORONARY ANGIOGRAM;  Surgeon: [name], MD;  Location: [facility] CATH LAB;  Service: Cardiovascular;  Laterality: N/A;  . LUMBAR LAMINECTOMY    . PARTIAL COLECTOMY     For diverticulitis  . PTCA     unsuccesful  . REPLACEMENT TOTAL KNEE     right   . SHOULDER ARTHROSCOPY    . SHOULDER SURGERY Bilateral   . TEE WITHOUT CARDIOVERSION N/A 05/04/2015   Procedure: TRANSESOPHAGEAL ECHOCARDIOGRAM (TEE);  Surgeon: [name], MD;  Location: [facility];  Service: Open Heart Surgery;  Laterality: N/A;    There were no vitals filed for this visit.      Subjective Assessment - 06/06/16 0738    Subjective Patient reported minimal soreness overall. Patient reported using arm at home using self pulley's for three weeks on his own with no instruction from MD.   Patient Stated Goals Use my arm without pain.   Currently in Pain? Yes   Pain Score 1    Pain Location Shoulder   Pain Orientation Left   Pain Descriptors / Indicators Tender   Pain Type Surgical pain   Pain Onset More than a month ago  Pain Frequency Intermittent   Aggravating Factors  certain movements   Pain Relieving Factors at rest            [facility] PT Assessment - 06/06/16 0001      PROM   PROM Assessment Site Shoulder   Right/Left Shoulder Left   Left Shoulder Flexion 155 Degrees   Left Shoulder External Rotation 80 Degrees                     [facility] Adult PT Treatment/Exercise - 06/06/16 0001      Shoulder Exercises: Supine   Other Supine Exercises cane for chest press, flexion.. ER 2x10 each     Shoulder Exercises: Pulleys   Flexion Other (comment)  66min   Other Pulley Exercises UE ranger AAROM using right for assistance for  elevation and circles 2x10     Moist Heat Therapy   Number Minutes Moist Heat 15 Minutes   Moist Heat Location Shoulder     Electrical Stimulation   Electrical Stimulation Location L shoulder   Electrical Stimulation Action premod   Electrical Stimulation Parameters 80-150hz  x67min   Electrical Stimulation Goals Pain     Manual Therapy   Manual Therapy Passive ROM   Passive ROM PROM of L shoulder into flex/ER/IR with gentle holds at end range, rhythmis stabilization for IR/ER in scaption                  PT Short Term Goals - 05/25/16 1707      PT SHORT TERM GOAL #1   Title STG's=LTG's.           PT Long Term Goals - 06/06/16 0800      PT LONG TERM GOAL #1   Title Ind with advanced HEP.   Time 8   Period Weeks   Status On-going     PT LONG TERM GOAL #2   Title Active left shoulder flexion to 155 degrees so the patient can easily reach overhead   Time 8   Period Weeks   Status On-going     PT LONG TERM GOAL #3   Title Perform ADL's with pain not > 3/10.   Time 8   Period Weeks   Status On-going     PT LONG TERM GOAL #4   Title Active ER to 80 degrees+ to allow for easily donning/doffing of apparel.   Time 8   Period Weeks   Status On-going     PT LONG TERM GOAL #5   Title Increase right shoulder strength to a solid 4+/5 to increase stability for performance of functional activities   Time 8   Period Weeks   Status On-going               Plan - 06/06/16 0801    Clinical Impression Statement Patient tolerated treatment well today with no reported increased pain. Patient has full PROM today and started AAROM per MPT with no difficulty. Patient reported not using a sling for several weeks. Patient current goals progressing yet ongoing due to protocol limitations.healing.    Rehab Potential Excellent   Clinical Impairments Affecting Rehab Potential surgery 04/19/16 current 7 weeks 06/07/16   PT Frequency 3x / week   PT Duration 4 weeks   PT  Treatment/Interventions ADLs/Self Care Home Management;Cryotherapy;Electrical Stimulation;Moist Heat;Ultrasound;Therapeutic activities;Therapeutic exercise;Neuromuscular re-education;Patient/family education;Manual techniques;Passive range of motion   PT Next Visit Plan cont with POC per MPT (MD. Onnie Graham 06/22/16)   Consulted and Agree with Plan  of Care Patient      Patient will benefit from skilled therapeutic intervention in order to improve the following deficits and impairments:  Pain, Decreased activity tolerance, Decreased range of motion  Visit Diagnosis: Acute pain of right shoulder  Stiffness of left shoulder, not elsewhere classified     Problem List Patient Active Problem List   Diagnosis Date Noted  . Chest pain 09/26/2015  . Elevated TSH 05/08/2015  . S/P CABG x 4   . Coronary artery disease involving native coronary artery of native heart with unstable angina pectoris ([place])   . Bilateral carotid artery disease ([place]) 04/28/2015  . BMI 28.0-28.9,adult   . Unstable angina ([place]) 03/18/2014  . Type 2 diabetes mellitus without complication, without long-term current use of insulin ([place]) 09/21/2013  . COPD (chronic obstructive pulmonary disease) ([place]) 09/21/2013  . PTSD (post-traumatic stress disorder) 09/21/2013  . CAD (coronary artery disease) 04/16/2013  . HTN (hypertension) 04/16/2013  . Hyperlipidemia 04/16/2013    Victory Strollo P, PTA 06/06/2016, 8:21 AM  [facility] [place], [place], [address] Phone: [phone]   Fax:  [phone]  Name: Eduardo [name] MRN: [id number] Date of Birth: February 23, 1946

## 2016-06-07 DIAGNOSIS — G8929 Other chronic pain: Secondary | ICD-10-CM | POA: Diagnosis not present

## 2016-06-07 DIAGNOSIS — M25511 Pain in right shoulder: Secondary | ICD-10-CM | POA: Diagnosis not present

## 2016-06-08 ENCOUNTER — Encounter: Payer: Self-pay | Admitting: Physical Therapy

## 2016-06-08 ENCOUNTER — Ambulatory Visit: Payer: Medicare Other | Admitting: Physical Therapy

## 2016-06-08 DIAGNOSIS — M25612 Stiffness of left shoulder, not elsewhere classified: Secondary | ICD-10-CM

## 2016-06-08 DIAGNOSIS — M25511 Pain in right shoulder: Secondary | ICD-10-CM | POA: Diagnosis not present

## 2016-06-08 NOTE — Therapy (Signed)
[facility] [place], [place], [address] Phone: [phone]   Fax:  [phone]  Physical Therapy Treatment  Patient Details  Name: Eduardo [name] MRN: [id number] Date of Birth: 1946-01-15 Referring Provider: Justice Britain MD  Encounter Date: 06/08/2016      PT End of Session - 06/08/16 0808    Visit Number 6   Number of Visits 16   Date for PT Re-Evaluation 07/24/16   PT Start Time 0730   PT Stop Time 0829   PT Time Calculation (min) 59 min   Activity Tolerance Patient tolerated treatment well   Behavior During Therapy [facility] for tasks assessed/performed      Past Medical History:  Diagnosis Date  . Bilateral carotid artery disease ([place]) 04/28/2015   1-39 percent bilateral stenosis noted on Doppler   . CAD (coronary artery disease)   . Cervical disc disease   . Chemical exposure    agent orange   . COPD (chronic obstructive pulmonary disease) ([place])   . Diabetes mellitus without complication ([place])   . Diverticulitis   . Hypertension   . Lumbar disc disease   . Obesity (BMI 30-39.9)   . PTSD (post-traumatic stress disorder)   . Sleep apnea     Past Surgical History:  Procedure Laterality Date  . CARDIAC CATHETERIZATION    . CARDIAC CATHETERIZATION  03/18/14   difficult to determine culprit vesel  . CARDIAC CATHETERIZATION N/A 04/29/2015   Procedure: Left Heart Cath and Coronary Angiography;  Surgeon: [name], MD;  Location: [place] [facility];  Service: Cardiovascular;  Laterality: N/A;  . CERVICAL SPINE SURGERY    . CHOLECYSTECTOMY    . CORONARY ARTERY BYPASS GRAFT N/A 05/04/2015   Procedure: CORONARY ARTERY BYPASS GRAFTING (CABG)  x four,  using left internal mammary artery, and bilateral thigh greater saphenous veins;  Surgeon: [name], MD;  Location: [facility];  Service: Open Heart Surgery;  Laterality: N/A;  . EYE SURGERY Right    cataract  . I&D EXTREMITY Left 05/30/2013   Procedure: IRRIGATION AND DEBRIDEMENT Left Hand and Foreign body removal;  Surgeon: [name], MD;  Location: [place];  Service: Orthopedics;  Laterality: Left;  . LEFT HEART CATHETERIZATION WITH CORONARY ANGIOGRAM N/A 03/18/2014   Procedure: LEFT HEART CATHETERIZATION WITH CORONARY ANGIOGRAM;  Surgeon: [name], MD;  Location[facility];  Service: Cardiovascular;  Laterality: N/A;  . LUMBAR LAMINECTOMY    . PARTIAL COLECTOMY     For diverticulitis  . PTCA     unsuccesful  . REPLACEMENT TOTAL KNEE     right   . SHOULDER ARTHROSCOPY    . SHOULDER SURGERY Bilateral   . TEE WITHOUT CARDIOVERSION N/A 05/04/2015   Procedure: TRANSESOPHAGEAL ECHOCARDIOGRAM (TEE);  Surgeon: [name], MD;  Location: [facility];  Service: Open Heart Surgery;  Laterality: N/A;    There were no vitals filed for this visit.      Subjective Assessment - 06/08/16 0738    Subjective Patient reported no new complaints and tolerated last treatment well   Patient Stated Goals Use my arm without pain.   Currently in Pain? Yes   Pain Score 1    Pain Location Shoulder   Pain Orientation Left   Pain Descriptors / Indicators Tender   Pain Type Surgical pain   Pain Onset More than a month ago   Pain Frequency Intermittent   Aggravating Factors  ceratin movements   Pain Relieving  Factors at rest            [facility] PT Assessment - 06/08/16 0001      PROM   PROM Assessment Site Shoulder   Right/Left Shoulder Left   Left Shoulder Flexion 155 Degrees   Left Shoulder External Rotation 80 Degrees                     [facility] Adult PT Treatment/Exercise - 06/08/16 0001      Shoulder Exercises: Supine   Other Supine Exercises cane for chest press, flexion.. ER 3x10 each     Shoulder Exercises: Standing   Other Standing Exercises 4 way isometrics 10sec x10each   Other Standing Exercises ball on table for AAROM flexion and circles 3x10each     Shoulder Exercises: Pulleys   Flexion Other  (comment)  62min   Other Pulley Exercises UE ranger AAROM using right for assistance for elevation and circles 2x10     Moist Heat Therapy   Number Minutes Moist Heat 15 Minutes   Moist Heat Location Shoulder     Electrical Stimulation   Electrical Stimulation Location L shoulder   Electrical Stimulation Action premod   Electrical Stimulation Parameters 80-150hz  x64min   Electrical Stimulation Goals Pain     Manual Therapy   Manual Therapy Passive ROM   Passive ROM PROM of L shoulder into flex/ER/IR with gentle holds at end range, rhythmis stabilization for IR/ER in scaption, flex/ext at 90 degrees                  PT Short Term Goals - 05/25/16 1707      PT SHORT TERM GOAL #1   Title STG's=LTG's.           PT Long Term Goals - 06/06/16 0800      PT LONG TERM GOAL #1   Title Ind with advanced HEP.   Time 8   Period Weeks   Status On-going     PT LONG TERM GOAL #2   Title Active left shoulder flexion to 155 degrees so the patient can easily reach overhead   Time 8   Period Weeks   Status On-going     PT LONG TERM GOAL #3   Title Perform ADL's with pain not > 3/10.   Time 8   Period Weeks   Status On-going     PT LONG TERM GOAL #4   Title Active ER to 80 degrees+ to allow for easily donning/doffing of apparel.   Time 8   Period Weeks   Status On-going     PT LONG TERM GOAL #5   Title Increase right shoulder strength to a solid 4+/5 to increase stability for performance of functional activities   Time 8   Period Weeks   Status On-going               Plan - 06/08/16 0827    Clinical Impression Statement Patient tolerated treatment well today and reported no pain during or post treatment. Patient continues with full PROM today. Patient progressing with AAROM activities today with no difficulty. Patient current goals ongoing due to healing/protocol limitations.   Rehab Potential Excellent   Clinical Impairments Affecting Rehab Potential  surgery 04/19/16 current 7 weeks 06/07/16   PT Frequency 3x / week   PT Duration 4 weeks   PT Treatment/Interventions ADLs/Self Care Home Management;Cryotherapy;Electrical Stimulation;Moist Heat;Ultrasound;Therapeutic activities;Therapeutic exercise;Neuromuscular re-education;Patient/family education;Manual techniques;Passive range of motion   PT Next Visit Plan cont  with POC per MPT (MD. Onnie Graham 06/22/16)   Consulted and Agree with Plan of Care Patient      Patient will benefit from skilled therapeutic intervention in order to improve the following deficits and impairments:  Pain, Decreased activity tolerance, Decreased range of motion  Visit Diagnosis: Acute pain of right shoulder  Stiffness of left shoulder, not elsewhere classified     Problem List Patient Active Problem List   Diagnosis Date Noted  . Chest pain 09/26/2015  . Elevated TSH 05/08/2015  . S/P CABG x 4   . Coronary artery disease involving native coronary artery of native heart with unstable angina pectoris ([place])   . Bilateral carotid artery disease ([place]) 04/28/2015  . BMI 28.0-28.9,adult   . Unstable angina ([place]) 03/18/2014  . Type 2 diabetes mellitus without complication, without long-term current use of insulin ([place]) 09/21/2013  . COPD (chronic obstructive pulmonary disease) ([place]) 09/21/2013  . PTSD (post-traumatic stress disorder) 09/21/2013  . CAD (coronary artery disease) 04/16/2013  . HTN (hypertension) 04/16/2013  . Hyperlipidemia 04/16/2013    Inman Fettig P, PTA 06/08/2016, 8:35 AM  [facility] [place], [place], [address] Phone: [phone]   Fax:  [phone]  Name: RAAHIL BUB MRN: [id number] Date of Birth: [birth year]

## 2016-06-10 ENCOUNTER — Encounter: Payer: Self-pay | Admitting: Physical Therapy

## 2016-06-10 ENCOUNTER — Ambulatory Visit: Payer: Medicare Other | Admitting: Physical Therapy

## 2016-06-10 DIAGNOSIS — M25612 Stiffness of left shoulder, not elsewhere classified: Secondary | ICD-10-CM

## 2016-06-10 DIAGNOSIS — M25511 Pain in right shoulder: Secondary | ICD-10-CM | POA: Diagnosis not present

## 2016-06-10 NOTE — Therapy (Signed)
[facility] [place], [place], [address] Phone: [phone]   Fax:  [phone]  Physical Therapy Treatment  Patient Details  Name: Eduardo [name] MRN: [id number] Date of Birth: 02/19/1946 Referring Provider: Justice Britain MD  Encounter Date: 06/10/2016      PT End of Session - 06/10/16 0727    Visit Number 7   Number of Visits 16   Date for PT Re-Evaluation 07/24/16   PT Start Time 0731   PT Stop Time 0816   PT Time Calculation (min) 45 min   Activity Tolerance Patient tolerated treatment well   Behavior During Therapy [facility] for tasks assessed/performed      Past Medical History:  Diagnosis Date  . Bilateral carotid artery disease ([place]) 04/28/2015   1-39 percent bilateral stenosis noted on Doppler   . CAD (coronary artery disease)   . Cervical disc disease   . Chemical exposure    agent orange   . COPD (chronic obstructive pulmonary disease) ([place])   . Diabetes mellitus without complication ([place])   . Diverticulitis   . Hypertension   . Lumbar disc disease   . Obesity (BMI 30-39.9)   . PTSD (post-traumatic stress disorder)   . Sleep apnea     Past Surgical History:  Procedure Laterality Date  . CARDIAC CATHETERIZATION    . CARDIAC CATHETERIZATION  03/18/14   difficult to determine culprit vesel  . CARDIAC CATHETERIZATION N/A 04/29/2015   Procedure: Left Heart Cath and Coronary Angiography;  Surgeon: [name], MD;  Location: [place] CV LAB;  Service: Cardiovascular;  Laterality: N/A;  . CERVICAL SPINE SURGERY    . CHOLECYSTECTOMY    . CORONARY ARTERY BYPASS GRAFT N/A 05/04/2015   Procedure: CORONARY ARTERY BYPASS GRAFTING (CABG)  x four,  using left internal mammary artery, and bilateral thigh greater saphenous veins;  Surgeon: [name], MD;  Location: [place];  Service: Open Heart Surgery;  Laterality: N/A;  . EYE SURGERY Right    cataract  . I&D EXTREMITY Left 05/30/2013    Procedure: IRRIGATION AND DEBRIDEMENT Left Hand and Foreign body removal;  Surgeon: [name], MD;  Location: [place];  Service: Orthopedics;  Laterality: Left;  . LEFT HEART CATHETERIZATION WITH CORONARY ANGIOGRAM N/A 03/18/2014   Procedure: LEFT HEART CATHETERIZATION WITH CORONARY ANGIOGRAM;  Surgeon: [name], MD;  Location: [facility] CATH LAB;  Service: Cardiovascular;  Laterality: N/A;  . LUMBAR LAMINECTOMY    . PARTIAL COLECTOMY     For diverticulitis  . PTCA     unsuccesful  . REPLACEMENT TOTAL KNEE     right   . SHOULDER ARTHROSCOPY    . SHOULDER SURGERY Bilateral   . TEE WITHOUT CARDIOVERSION N/A 05/04/2015   Procedure: TRANSESOPHAGEAL ECHOCARDIOGRAM (TEE);  Surgeon: [name], MD;  Location: [place];  Service: Open Heart Surgery;  Laterality: N/A;    There were no vitals filed for this visit.      Subjective Assessment - 06/10/16 0727    Subjective Reports only shoulder tenderness but no pain.   Patient Stated Goals Use my arm without pain.   Currently in Pain? Yes   Pain Score 2    Pain Location Shoulder   Pain Orientation Left   Pain Descriptors / Indicators Tender   Pain Type Surgical pain   Pain Onset More than a month ago            [facility] PT Assessment - 06/10/16 0001  Assessment   Medical Diagnosis Left rotator cuff repair.   Onset Date/Surgical Date 04/19/16   Next MD Visit 06/22/2016     ROM / Strength   AROM / PROM / Strength AROM     AROM   Overall AROM  Within functional limits for tasks performed   AROM Assessment Site Shoulder   Right/Left Shoulder Left   Left Shoulder Internal Rotation 67 Degrees   Left Shoulder External Rotation 85 Degrees                     [facility] Adult PT Treatment/Exercise - 06/10/16 0001      Shoulder Exercises: Supine   Protraction AAROM;Both;20 reps   External Rotation AAROM;Both;20 reps   Flexion AAROM;Both;20 reps     Shoulder Exercises: Pulleys   Flexion Other (comment)  x5 min    Other Pulley Exercises UE ranger flex/CW/CCW circles x20 reps each  No assist required by RUE     Modalities   Modalities Electrical Stimulation;Moist Heat     Moist Heat Therapy   Number Minutes Moist Heat 15 Minutes   Moist Heat Location Shoulder     Electrical Stimulation   Electrical Stimulation Location L shoulder   Electrical Stimulation Action Pre-Mod   Electrical Stimulation Parameters 80-150 hz x15 min   Electrical Stimulation Goals Pain     Manual Therapy   Manual Therapy Passive ROM   Passive ROM PROM of L shoulder into flex/ER/IR with gentle holds at end range                  PT Short Term Goals - 05/25/16 1707      PT SHORT TERM GOAL #1   Title STG's=LTG's.           PT Long Term Goals - 06/10/16 0743      PT LONG TERM GOAL #1   Title Ind with advanced HEP.   Time 8   Period Weeks   Status On-going     PT LONG TERM GOAL #2   Title Active left shoulder flexion to 155 degrees so the patient can easily reach overhead   Time 8   Period Weeks   Status On-going     PT LONG TERM GOAL #3   Title Perform ADL's with pain not > 3/10.   Time 8   Period Weeks   Status On-going     PT LONG TERM GOAL #4   Title Active ER to 80 degrees+ to allow for easily donning/doffing of apparel.   Time 8   Period Weeks   Status Achieved  L shoulder AROM flexion 85 deg 06/10/2016     PT LONG TERM GOAL #5   Title Increase right shoulder strength to a solid 4+/5 to increase stability for performance of functional activities   Time 8   Period Weeks   Status On-going               Plan - 06/10/16 0805    Clinical Impression Statement Patient tolerated today's treatment well with only complaints of L shoulder tenderness upon arrival today. Patient able to complete AAROM exercises in standing and supine without assist from other UE. Patient experienced only soreness with P/AAROM into flexion at very end range. Firm end range noted with PROM of L  shoulder into all directions assessed with smooth arc of motion. AROM L shoulder ER 85 deg, IR 67 deg in supine today. Normal modalities response noted following removal of the  modalities. Patient did not have sling donned today when arriving in clinic.   Rehab Potential Excellent   Clinical Impairments Affecting Rehab Potential surgery 04/19/16 current 7 weeks 06/07/16   PT Frequency 3x / week   PT Duration 4 weeks   PT Treatment/Interventions ADLs/Self Care Home Management;Cryotherapy;Electrical Stimulation;Moist Heat;Ultrasound;Therapeutic activities;Therapeutic exercise;Neuromuscular re-education;Patient/family education;Manual techniques;Passive range of motion   PT Next Visit Plan cont with POC per MPT (MD. Onnie Graham 06/22/16)   Consulted and Agree with Plan of Care Patient      Patient will benefit from skilled therapeutic intervention in order to improve the following deficits and impairments:  Pain, Decreased activity tolerance, Decreased range of motion  Visit Diagnosis: Acute pain of right shoulder  Stiffness of left shoulder, not elsewhere classified     Problem List Patient Active Problem List   Diagnosis Date Noted  . Chest pain 09/26/2015  . Elevated TSH 05/08/2015  . S/P CABG x 4   . Coronary artery disease involving native coronary artery of native heart with unstable angina pectoris ([place])   . Bilateral carotid artery disease ([place]) 04/28/2015  . BMI 28.0-28.9,adult   . Unstable angina ([place]) 03/18/2014  . Type 2 diabetes mellitus without complication, without long-term current use of insulin ([place]) 09/21/2013  . COPD (chronic obstructive pulmonary disease) ([place]) 09/21/2013  . PTSD (post-traumatic stress disorder) 09/21/2013  . CAD (coronary artery disease) 04/16/2013  . HTN (hypertension) 04/16/2013  . Hyperlipidemia 04/16/2013    Wynelle Fanny, PTA 06/10/2016, 8:19 AM  [facility] [address] [place], [place],  [address] Phone: [phone]   Fax:  [phone]  Name: Eduardo [name] MRN: [id number] Date of Birth: 1945/06/29

## 2016-06-13 ENCOUNTER — Ambulatory Visit: Payer: Medicare Other | Admitting: Physical Therapy

## 2016-06-13 ENCOUNTER — Encounter: Payer: Self-pay | Admitting: Physical Therapy

## 2016-06-13 DIAGNOSIS — M25612 Stiffness of left shoulder, not elsewhere classified: Secondary | ICD-10-CM | POA: Diagnosis not present

## 2016-06-13 DIAGNOSIS — M25511 Pain in right shoulder: Secondary | ICD-10-CM | POA: Diagnosis not present

## 2016-06-13 NOTE — Therapy (Signed)
[facility] [place], [place], [address] Phone: [phone]   Fax:  [phone]  Physical Therapy Treatment  Patient Details  Name: Eduardo [name] MRN: [id number] Date of Birth: [birth year] Referring Provider: Justice Britain MD  Encounter Date: 06/13/2016      PT End of Session - 06/13/16 0744    Visit Number 8   Number of Visits 16   Date for PT Re-Evaluation 07/24/16   PT Start Time 0729   PT Stop Time 0819   PT Time Calculation (min) 50 min   Activity Tolerance Patient tolerated treatment well   Behavior During Therapy [facility] for tasks assessed/performed      Past Medical History:  Diagnosis Date  . Bilateral carotid artery disease ([place]) 04/28/2015   1-39 percent bilateral stenosis noted on Doppler   . CAD (coronary artery disease)   . Cervical disc disease   . Chemical exposure    agent orange   . COPD (chronic obstructive pulmonary disease) ([place])   . Diabetes mellitus without complication ([place])   . Diverticulitis   . Hypertension   . Lumbar disc disease   . Obesity (BMI 30-39.9)   . PTSD (post-traumatic stress disorder)   . Sleep apnea     Past Surgical History:  Procedure Laterality Date  . CARDIAC CATHETERIZATION    . CARDIAC CATHETERIZATION  03/18/14   difficult to determine culprit vesel  . CARDIAC CATHETERIZATION N/A 04/29/2015   Procedure: Left Heart Cath and Coronary Angiography;  Surgeon: [name], MD;  Location: [place] CV [facility];  Service: Cardiovascular;  Laterality: N/A;  . CERVICAL SPINE SURGERY    . CHOLECYSTECTOMY    . CORONARY ARTERY BYPASS GRAFT N/A 05/04/2015   Procedure: CORONARY ARTERY BYPASS GRAFTING (CABG)  x four,  using left internal mammary artery, and bilateral thigh greater saphenous veins;  Surgeon: [name], MD;  Location: [place];  Service: Open Heart Surgery;  Laterality: N/A;  . EYE SURGERY Right    cataract  . I&D EXTREMITY Left 05/30/2013   Procedure: IRRIGATION AND DEBRIDEMENT Left Hand and Foreign body removal;  Surgeon: [name], MD;  Location: [place];  Service: Orthopedics;  Laterality: Left;  . LEFT HEART CATHETERIZATION WITH CORONARY ANGIOGRAM N/A 03/18/2014   Procedure: LEFT HEART CATHETERIZATION WITH CORONARY ANGIOGRAM;  Surgeon: [name], MD;  Location: [facility] CATH LAB;  Service: Cardiovascular;  Laterality: N/A;  . LUMBAR LAMINECTOMY    . PARTIAL COLECTOMY     For diverticulitis  . PTCA     unsuccesful  . REPLACEMENT TOTAL KNEE     right   . SHOULDER ARTHROSCOPY    . SHOULDER SURGERY Bilateral   . TEE WITHOUT CARDIOVERSION N/A 05/04/2015   Procedure: TRANSESOPHAGEAL ECHOCARDIOGRAM (TEE);  Surgeon: [name], MD;  Location: [place];  Service: Open Heart Surgery;  Laterality: N/A;    There were no vitals filed for this visit.      Subjective Assessment - 06/13/16 0732    Subjective Patient reported increased tenderness after using arm over weekend around the house   Patient Stated Goals Use my arm without pain.   Currently in Pain? Yes   Pain Score 3    Pain Location Shoulder   Pain Descriptors / Indicators Tender   Pain Type Surgical pain   Pain Onset More than a month ago   Pain Frequency Intermittent   Aggravating Factors  use and certain movements   Pain Relieving Factors  at rest            [facility] PT Assessment - 06/13/16 0001      PROM   Right/Left Shoulder Left   Left Shoulder Flexion 158 Degrees   Left Shoulder External Rotation 80 Degrees                     [facility] Adult PT Treatment/Exercise - 06/13/16 0001      Shoulder Exercises: Supine   Other Supine Exercises cane for chest press, flexion.. ER 3x10 each     Shoulder Exercises: Standing   Other Standing Exercises wall circles 2x10     Shoulder Exercises: Pulleys   Flexion Other (comment)  37min   Other Pulley Exercises UE ranger for elevation and circles 2x10 each   Other Pulley Exercises wall ladder  x33min     Moist Heat Therapy   Number Minutes Moist Heat 15 Minutes   Moist Heat Location Shoulder     Electrical Stimulation   Electrical Stimulation Location L shoulder   Electrical Stimulation Action premod   Electrical Stimulation Parameters 80-150hz  x29min   Electrical Stimulation Goals Pain     Manual Therapy   Manual Therapy Passive ROM   Passive ROM PROM of L shoulder into flex/ER/IR with gentle holds at end range, rythmic stabiliazation for IR/ER in scaption, flex/ext at 90 degrees                  PT Short Term Goals - 05/25/16 1707      PT SHORT TERM GOAL #1   Title STG's=LTG's.           PT Long Term Goals - 06/10/16 0743      PT LONG TERM GOAL #1   Title Ind with advanced HEP.   Time 8   Period Weeks   Status On-going     PT LONG TERM GOAL #2   Title Active left shoulder flexion to 155 degrees so the patient can easily reach overhead   Time 8   Period Weeks   Status On-going     PT LONG TERM GOAL #3   Title Perform ADL's with pain not > 3/10.   Time 8   Period Weeks   Status On-going     PT LONG TERM GOAL #4   Title Active ER to 80 degrees+ to allow for easily donning/doffing of apparel.   Time 8   Period Weeks   Status Achieved  L shoulder AROM flexion 85 deg 06/10/2016     PT LONG TERM GOAL #5   Title Increase right shoulder strength to a solid 4+/5 to increase stability for performance of functional activities   Time 8   Period Weeks   Status On-going               Plan - 06/13/16 [id number]    Clinical Impression Statement Patient tolerated treatment well without pain reported. Patient progresing with full PROM for right shoulder flexion and ER today. Patient has reported using his arm for light ADL's. Educated patient on current protocol for healing and injury prevention. Patient current goals progressing yet ongoing due to healing/protocol.    Rehab Potential Excellent   Clinical Impairments Affecting Rehab Potential  surgery 04/19/16 current 8 weeks 06/14/16   PT Frequency 3x / week   PT Duration 4 weeks   PT Treatment/Interventions ADLs/Self Care Home Management;Cryotherapy;Electrical Stimulation;Moist Heat;Ultrasound;Therapeutic activities;Therapeutic exercise;Neuromuscular re-education;Patient/family education;Manual techniques;Passive range of motion   PT Next Visit Plan  cont with POC per MPT (MD. Onnie Graham 06/22/16)   Consulted and Agree with Plan of Care Patient      Patient will benefit from skilled therapeutic intervention in order to improve the following deficits and impairments:  Pain, Decreased activity tolerance, Decreased range of motion  Visit Diagnosis: Acute pain of right shoulder  Stiffness of left shoulder, not elsewhere classified     Problem List Patient Active Problem List   Diagnosis Date Noted  . Chest pain 09/26/2015  . Elevated TSH 05/08/2015  . S/P CABG x 4   . Coronary artery disease involving native coronary artery of native heart with unstable angina pectoris ([place])   . Bilateral carotid artery disease ([place]) 04/28/2015  . BMI 28.0-28.9,adult   . Unstable angina ([place]) 03/18/2014  . Type 2 diabetes mellitus without complication, without long-term current use of insulin ([place]) 09/21/2013  . COPD (chronic obstructive pulmonary disease) ([place]) 09/21/2013  . PTSD (post-traumatic stress disorder) 09/21/2013  . CAD (coronary artery disease) 04/16/2013  . HTN (hypertension) 04/16/2013  . Hyperlipidemia 04/16/2013    Gricel Copen P, PTA 06/13/2016, 8:22 AM  [facility] [place], [place], [address] Phone: [phone]   Fax:  [phone]  Name: Eduardo [name] MRN: [id number] Date of Birth: 11-01-1945

## 2016-06-15 ENCOUNTER — Encounter: Payer: TRICARE For Life (TFL) | Admitting: Physical Therapy

## 2016-06-22 ENCOUNTER — Ambulatory Visit: Payer: Medicare Other | Admitting: Physical Therapy

## 2016-06-22 ENCOUNTER — Encounter: Payer: Self-pay | Admitting: Physical Therapy

## 2016-06-22 DIAGNOSIS — M25511 Pain in right shoulder: Secondary | ICD-10-CM | POA: Diagnosis not present

## 2016-06-22 DIAGNOSIS — M25612 Stiffness of left shoulder, not elsewhere classified: Secondary | ICD-10-CM

## 2016-06-22 DIAGNOSIS — G8929 Other chronic pain: Secondary | ICD-10-CM | POA: Diagnosis not present

## 2016-06-22 DIAGNOSIS — Z4789 Encounter for other orthopedic aftercare: Secondary | ICD-10-CM | POA: Diagnosis not present

## 2016-06-22 NOTE — Therapy (Signed)
[facility] [place], [place], [address] Phone: [phone]   Fax:  [phone]  Physical Therapy Treatment  Patient Details  Name: Eduardo [name] MRN: [id number] Date of Birth: [birth year] Referring Provider: Justice Britain MD  Encounter Date: 06/22/2016      PT End of Session - 06/22/16 1255    Visit Number 9   Number of Visits 16   Date for PT Re-Evaluation 07/24/16   PT Start Time 1233   PT Stop Time 1320   PT Time Calculation (min) 47 min   Activity Tolerance Patient tolerated treatment well   Behavior During Therapy [facility] for tasks assessed/performed      Past Medical History:  Diagnosis Date  . Bilateral carotid artery disease ([place]) 04/28/2015   1-39 percent bilateral stenosis noted on Doppler   . CAD (coronary artery disease)   . Cervical disc disease   . Chemical exposure    agent orange   . COPD (chronic obstructive pulmonary disease) ([place])   . Diabetes mellitus without complication ([place])   . Diverticulitis   . Hypertension   . Lumbar disc disease   . Obesity (BMI 30-39.9)   . PTSD (post-traumatic stress disorder)   . Sleep apnea     Past Surgical History:  Procedure Laterality Date  . CARDIAC CATHETERIZATION    . CARDIAC CATHETERIZATION  03/18/14   difficult to determine culprit vesel  . CARDIAC CATHETERIZATION N/A 04/29/2015   Procedure: Left Heart Cath and Coronary Angiography;  Surgeon: [name], MD;  Location: [place] CV LAB;  Service: Cardiovascular;  Laterality: N/A;  . CERVICAL SPINE SURGERY    . CHOLECYSTECTOMY    . CORONARY ARTERY BYPASS GRAFT N/A 05/04/2015   Procedure: CORONARY ARTERY BYPASS GRAFTING (CABG)  x four,  using left internal mammary artery, and bilateral thigh greater saphenous veins;  Surgeon: [name], MD;  Location: [place];  Service: Open Heart Surgery;  Laterality: N/A;  . EYE SURGERY Right    cataract  . I&D EXTREMITY Left 05/30/2013   Procedure: IRRIGATION AND DEBRIDEMENT Left Hand and Foreign body removal;  Surgeon: [name], MD;  Location: [place];  Service: Orthopedics;  Laterality: Left;  . LEFT HEART CATHETERIZATION WITH CORONARY ANGIOGRAM N/A 03/18/2014   Procedure: LEFT HEART CATHETERIZATION WITH CORONARY ANGIOGRAM;  Surgeon: [name], MD;  Location: [facility] CATH LAB;  Service: Cardiovascular;  Laterality: N/A;  . LUMBAR LAMINECTOMY    . PARTIAL COLECTOMY     For diverticulitis  . PTCA     unsuccesful  . REPLACEMENT TOTAL KNEE     right   . SHOULDER ARTHROSCOPY    . SHOULDER SURGERY Bilateral   . TEE WITHOUT CARDIOVERSION N/A 05/04/2015   Procedure: TRANSESOPHAGEAL ECHOCARDIOGRAM (TEE);  Surgeon: [name], MD;  Location: [place];  Service: Open Heart Surgery;  Laterality: N/A;    There were no vitals filed for this visit.      Subjective Assessment - 06/22/16 1236    Subjective Patient went to MD and shoulder is doing well and is to continue therapy   Patient Stated Goals Use my arm without pain.   Currently in Pain? Yes   Pain Score 2    Pain Location Shoulder   Pain Orientation Left   Pain Descriptors / Indicators Tender   Pain Type Surgical pain   Pain Onset More than a month ago   Pain Frequency Intermittent   Aggravating Factors  certain movements  Pain Relieving Factors at rest                         [facility] Adult PT Treatment/Exercise - 06/22/16 0001      Shoulder Exercises: Supine   Other Supine Exercises cane for chest press, flexion.. ER 3x10 each     Shoulder Exercises: Standing   Other Standing Exercises wall circles 2x10     Shoulder Exercises: Pulleys   Flexion Other (comment)  62min   Other Pulley Exercises UE ranger for elevation and circles 2x10 each   Other Pulley Exercises wall ladder x73min     Moist Heat Therapy   Number Minutes Moist Heat 15 Minutes   Moist Heat Location Shoulder     Electrical Stimulation   Electrical Stimulation  Location L shoulder   Electrical Stimulation Action premod   Electrical Stimulation Parameters 80-150hz  x41min   Electrical Stimulation Goals Pain     Manual Therapy   Manual Therapy Passive ROM   Passive ROM PROM of L shoulder into flex/ER/IR with gentle holds at end range, rythmic stabiliazation for IR/ER in scaption, flex/ext at 90 degrees                  PT Short Term Goals - 05/25/16 1707      PT SHORT TERM GOAL #1   Title STG's=LTG's.           PT Long Term Goals - 06/10/16 0743      PT LONG TERM GOAL #1   Title Ind with advanced HEP.   Time 8   Period Weeks   Status On-going     PT LONG TERM GOAL #2   Title Active left shoulder flexion to 155 degrees so the patient can easily reach overhead   Time 8   Period Weeks   Status On-going     PT LONG TERM GOAL #3   Title Perform ADL's with pain not > 3/10.   Time 8   Period Weeks   Status On-going     PT LONG TERM GOAL #4   Title Active ER to 80 degrees+ to allow for easily donning/doffing of apparel.   Time 8   Period Weeks   Status Achieved  L shoulder AROM flexion 85 deg 06/10/2016     PT LONG TERM GOAL #5   Title Increase right shoulder strength to a solid 4+/5 to increase stability for performance of functional activities   Time 8   Period Weeks   Status On-going               Plan - 06/22/16 1308    Clinical Impression Statement Patient tolerated treatment well today with little to no pain complaints only some tenderness with certain movements. Patient has full PROM in left shoulder today. Patient has reported doing ADL's and using tubing band for exercises on his home. Educated patient on protocol and healing process to avoid re-injury. Patient current goals ongoing due to healing/protocol limitations.   Rehab Potential Excellent   Clinical Impairments Affecting Rehab Potential surgery 04/19/16 current 9 weeks 06/21/16   PT Frequency 3x / week   PT Duration 4 weeks   PT  Treatment/Interventions ADLs/Self Care Home Management;Cryotherapy;Electrical Stimulation;Moist Heat;Ultrasound;Therapeutic activities;Therapeutic exercise;Neuromuscular re-education;Patient/family education;Manual techniques;Passive range of motion   PT Next Visit Plan cont with POC per MPT for AAROM exercises and may start RW4 and AROM supine after 10 weeks (06/28/16) (next visit 10th with clinical judgement G-code)  Consulted and Agree with Plan of Care Patient      Patient will benefit from skilled therapeutic intervention in order to improve the following deficits and impairments:  Pain, Decreased activity tolerance, Decreased range of motion  Visit Diagnosis: Acute pain of right shoulder  Stiffness of left shoulder, not elsewhere classified     Problem List Patient Active Problem List   Diagnosis Date Noted  . Chest pain 09/26/2015  . Elevated TSH 05/08/2015  . S/P CABG x 4   . Coronary artery disease involving native coronary artery of native heart with unstable angina pectoris ([place])   . Bilateral carotid artery disease ([place]) 04/28/2015  . BMI 28.0-28.9,adult   . Unstable angina ([place]) 03/18/2014  . Type 2 diabetes mellitus without complication, without long-term current use of insulin ([place]) 09/21/2013  . COPD (chronic obstructive pulmonary disease) ([place]) 09/21/2013  . PTSD (post-traumatic stress disorder) 09/21/2013  . CAD (coronary artery disease) 04/16/2013  . HTN (hypertension) 04/16/2013  . Hyperlipidemia 04/16/2013    Eiko Mcgowen P, PTA 06/22/2016, 1:25 PM  [facility] [address] [place], [place], [address] Phone: [phone]   Fax:  [phone]  Name: Eduardo [name] MRN: [id number] Date of Birth: 10-25-1945

## 2016-06-23 DIAGNOSIS — M1811 Unilateral primary osteoarthritis of first carpometacarpal joint, right hand: Secondary | ICD-10-CM | POA: Diagnosis not present

## 2016-06-24 ENCOUNTER — Ambulatory Visit: Payer: Medicare Other | Admitting: Physical Therapy

## 2016-06-24 ENCOUNTER — Encounter: Payer: Self-pay | Admitting: Physical Therapy

## 2016-06-24 DIAGNOSIS — M25511 Pain in right shoulder: Secondary | ICD-10-CM

## 2016-06-24 DIAGNOSIS — M25612 Stiffness of left shoulder, not elsewhere classified: Secondary | ICD-10-CM

## 2016-06-24 NOTE — Therapy (Signed)
[facility] [place], [place], [address] Phone: [phone]   Fax:  [phone]  Physical Therapy Treatment  Patient Details  Name: Eduardo [name] MRN: [id number] Date of Birth: 25-Feb-1946 Referring Provider: Justice Britain MD  Encounter Date: 06/24/2016      PT End of Session - 06/24/16 0729    Visit Number 10   Number of Visits 16   Date for PT Re-Evaluation 07/24/16   PT Start Time 0728   PT Stop Time 0822   PT Time Calculation (min) 54 min   Activity Tolerance Patient tolerated treatment well   Behavior During Therapy [facility] for tasks assessed/performed      Past Medical History:  Diagnosis Date  . Bilateral carotid artery disease ([place]) 04/28/2015   1-39 percent bilateral stenosis noted on Doppler   . CAD (coronary artery disease)   . Cervical disc disease   . Chemical exposure    agent orange   . COPD (chronic obstructive pulmonary disease) ([place])   . Diabetes mellitus without complication ([place])   . Diverticulitis   . Hypertension   . Lumbar disc disease   . Obesity (BMI 30-39.9)   . PTSD (post-traumatic stress disorder)   . Sleep apnea     Past Surgical History:  Procedure Laterality Date  . CARDIAC CATHETERIZATION    . CARDIAC CATHETERIZATION  03/18/14   difficult to determine culprit vesel  . CARDIAC CATHETERIZATION N/A 04/29/2015   Procedure: Left Heart Cath and Coronary Angiography;  Surgeon: [name], MD;  Location: [place] CV LAB;  Service: Cardiovascular;  Laterality: N/A;  . CERVICAL SPINE SURGERY    . CHOLECYSTECTOMY    . CORONARY ARTERY BYPASS GRAFT N/A 05/04/2015   Procedure: CORONARY ARTERY BYPASS GRAFTING (CABG)  x four,  using left internal mammary artery, and bilateral thigh greater saphenous veins;  Surgeon: [name], MD;  Location: [place];  Service: Open Heart Surgery;  Laterality: N/A;  . EYE SURGERY Right    cataract  . I&D EXTREMITY Left 05/30/2013   Procedure: IRRIGATION AND DEBRIDEMENT Left Hand and Foreign body removal;  Surgeon: [name], MD;  Location: [place];  Service: Orthopedics;  Laterality: Left;  . LEFT HEART CATHETERIZATION WITH CORONARY ANGIOGRAM N/A 03/18/2014   Procedure: LEFT HEART CATHETERIZATION WITH CORONARY ANGIOGRAM;  Surgeon: [name], MD;  Location: [facility] CATH LAB;  Service: Cardiovascular;  Laterality: N/A;  . LUMBAR LAMINECTOMY    . PARTIAL COLECTOMY     For diverticulitis  . PTCA     unsuccesful  . REPLACEMENT TOTAL KNEE     right   . SHOULDER ARTHROSCOPY    . SHOULDER SURGERY Bilateral   . TEE WITHOUT CARDIOVERSION N/A 05/04/2015   Procedure: TRANSESOPHAGEAL ECHOCARDIOGRAM (TEE);  Surgeon: [name], MD;  Location: [place];  Service: Open Heart Surgery;  Laterality: N/A;    There were no vitals filed for this visit.      Subjective Assessment - 06/24/16 0729    Subjective Reports doing blue theraband exercises at home for two weeks.   Patient Stated Goals Use my arm without pain.   Currently in Pain? No/denies            [facility] PT Assessment - 06/24/16 0001      Assessment   Medical Diagnosis Left rotator cuff repair.   Onset Date/Surgical Date 04/19/16   Next MD Visit 07/20/2016  [facility] Adult PT Treatment/Exercise - 06/24/16 0001      Shoulder Exercises: Supine   Protraction AAROM;Both   Protraction Limitations 3x10 reps   External Rotation AAROM;Both;Other (comment)  3x10 reps   Flexion AAROM;Both   Flexion Limitations 3x10 reps     Shoulder Exercises: Standing   Other Standing Exercises ball on table for AAROM flexion and circles 3x10each     Shoulder Exercises: Pulleys   Flexion Other (comment)  x5 min   Other Pulley Exercises UE ranger for elevation and circles 3x10 each   Other Pulley Exercises wall ladder x22min     Modalities   Modalities Electrical Stimulation;Moist Heat     Moist Heat Therapy   Number Minutes Moist Heat 15  Minutes   Moist Heat Location Shoulder     Electrical Stimulation   Electrical Stimulation Location L shoulder   Electrical Stimulation Action Pre-Mod   Electrical Stimulation Parameters 80-150 hz x15 min   Electrical Stimulation Goals Pain     Manual Therapy   Manual Therapy Passive ROM   Passive ROM PROM of L shoulder into flex/ER/IR with gentle holds at end range, rythmic stabiliazation for IR/ER in scaption, flex/ext at 90 degrees                  PT Short Term Goals - 05/25/16 1707      PT SHORT TERM GOAL #1   Title STG's=LTG's.           PT Long Term Goals - 06/10/16 0743      PT LONG TERM GOAL #1   Title Ind with advanced HEP.   Time 8   Period Weeks   Status On-going     PT LONG TERM GOAL #2   Title Active left shoulder flexion to 155 degrees so the patient can easily reach overhead   Time 8   Period Weeks   Status On-going     PT LONG TERM GOAL #3   Title Perform ADL's with pain not > 3/10.   Time 8   Period Weeks   Status On-going     PT LONG TERM GOAL #4   Title Active ER to 80 degrees+ to allow for easily donning/doffing of apparel.   Time 8   Period Weeks   Status Achieved  L shoulder AROM flexion 85 deg 06/10/2016     PT LONG TERM GOAL #5   Title Increase right shoulder strength to a solid 4+/5 to increase stability for performance of functional activities   Time 8   Period Weeks   Status On-going               Plan - 06/24/16 0809    Clinical Impression Statement Patient tolerated today's treatment well with no complaints of pain with only minimal tenderness at end range P/AROM. Smooth arc of motion and firm end feels noted with P/AROM. Goals remain on-going secondary to deficits with AROM flexion, strength. Normal modalities response noted following removal of the modalities.      Rehab Potential Excellent   Clinical Impairments Affecting Rehab Potential surgery 04/19/16 current 9 weeks 06/21/16   PT Frequency 3x / week    PT Duration 4 weeks   PT Treatment/Interventions ADLs/Self Care Home Management;Cryotherapy;Electrical Stimulation;Moist Heat;Ultrasound;Therapeutic activities;Therapeutic exercise;Neuromuscular re-education;Patient/family education;Manual techniques;Passive range of motion   PT Next Visit Plan Continue per protocol with possible advancement to RW4 and supine AROM after 06/28/2016.   Consulted and Agree with Plan of Care Patient  Patient will benefit from skilled therapeutic intervention in order to improve the following deficits and impairments:  Pain, Decreased activity tolerance, Decreased range of motion  Visit Diagnosis: Acute pain of right shoulder  Stiffness of left shoulder, not elsewhere classified     Problem List Patient Active Problem List   Diagnosis Date Noted  . Chest pain 09/26/2015  . Elevated TSH 05/08/2015  . S/P CABG x 4   . Coronary artery disease involving native coronary artery of native heart with unstable angina pectoris ([place])   . Bilateral carotid artery disease ([place]) 04/28/2015  . BMI 28.0-28.9,adult   . Unstable angina ([place]) 03/18/2014  . Type 2 diabetes mellitus without complication, without long-term current use of insulin ([place]) 09/21/2013  . COPD (chronic obstructive pulmonary disease) ([place]) 09/21/2013  . PTSD (post-traumatic stress disorder) 09/21/2013  . CAD (coronary artery disease) 04/16/2013  . HTN (hypertension) 04/16/2013  . Hyperlipidemia 04/16/2013    Wynelle Fanny, PTA 06/24/2016, 8:24 AM  [facility] [address] [place], [place], [address] Phone: [phone]   Fax:  [phone]  Name: Eduardo [name] MRN: [id number] Date of Birth: [birth year]

## 2016-06-27 ENCOUNTER — Ambulatory Visit: Payer: Medicare Other | Admitting: Physical Therapy

## 2016-06-27 ENCOUNTER — Encounter: Payer: Self-pay | Admitting: Physical Therapy

## 2016-06-27 DIAGNOSIS — M25612 Stiffness of left shoulder, not elsewhere classified: Secondary | ICD-10-CM | POA: Diagnosis not present

## 2016-06-27 DIAGNOSIS — M25511 Pain in right shoulder: Secondary | ICD-10-CM

## 2016-06-27 NOTE — Therapy (Signed)
[facility] [place], [place], [address] Phone: [phone]   Fax:  [phone]  Physical Therapy Treatment  Patient Details  Name: Eduardo [name] MRN: [id number] Date of Birth: [birth year] Referring Provider: Justice Britain MD  Encounter Date: 06/27/2016      PT End of Session - 06/27/16 0747    Visit Number 11   Number of Visits 16   Date for PT Re-Evaluation 07/24/16   PT Start Time 0730   PT Stop Time 0827   PT Time Calculation (min) 57 min   Activity Tolerance Patient tolerated treatment well   Behavior During Therapy [facility] for tasks assessed/performed      Past Medical History:  Diagnosis Date  . Bilateral carotid artery disease ([place]) 04/28/2015   1-39 percent bilateral stenosis noted on Doppler   . CAD (coronary artery disease)   . Cervical disc disease   . Chemical exposure    agent orange   . COPD (chronic obstructive pulmonary disease) ([place])   . Diabetes mellitus without complication ([place])   . Diverticulitis   . Hypertension   . Lumbar disc disease   . Obesity (BMI 30-39.9)   . PTSD (post-traumatic stress disorder)   . Sleep apnea     Past Surgical History:  Procedure Laterality Date  . CARDIAC CATHETERIZATION    . CARDIAC CATHETERIZATION  03/18/14   difficult to determine culprit vesel  . CARDIAC CATHETERIZATION N/A 04/29/2015   Procedure: Left Heart Cath and Coronary Angiography;  Surgeon: [name], MD;  Location: [place] [facility];  Service: Cardiovascular;  Laterality: N/A;  . CERVICAL SPINE SURGERY    . CHOLECYSTECTOMY    . CORONARY ARTERY BYPASS GRAFT N/A 05/04/2015   Procedure: CORONARY ARTERY BYPASS GRAFTING (CABG)  x four,  using left internal mammary artery, and bilateral thigh greater saphenous veins;  Surgeon: [name], MD;  Location: [place];  Service: Open Heart Surgery;  Laterality: N/A;  . EYE SURGERY Right    cataract  . I&D EXTREMITY Left 05/30/2013   Procedure: IRRIGATION AND DEBRIDEMENT Left Hand and Foreign body removal;  Surgeon: [name], MD;  Location: [place];  Service: Orthopedics;  Laterality: Left;  . LEFT HEART CATHETERIZATION WITH CORONARY ANGIOGRAM N/A 03/18/2014   Procedure: LEFT HEART CATHETERIZATION WITH CORONARY ANGIOGRAM;  Surgeon: [name], MD;  Location: [facility] CATH LAB;  Service: Cardiovascular;  Laterality: N/A;  . LUMBAR LAMINECTOMY    . PARTIAL COLECTOMY     For diverticulitis  . PTCA     unsuccesful  . REPLACEMENT TOTAL KNEE     right   . SHOULDER ARTHROSCOPY    . SHOULDER SURGERY Bilateral   . TEE WITHOUT CARDIOVERSION N/A 05/04/2015   Procedure: TRANSESOPHAGEAL ECHOCARDIOGRAM (TEE);  Surgeon: [name], MD;  Location: [place];  Service: Open Heart Surgery;  Laterality: N/A;    There were no vitals filed for this visit.      Subjective Assessment - 06/27/16 0738    Subjective Patient has no new complaints today   Patient Stated Goals Use my arm without pain.   Currently in Pain? No/denies                         [facility] Adult PT Treatment/Exercise - 06/27/16 0001      Shoulder Exercises: Supine   Protraction AAROM;Both   Protraction Limitations 3x10 reps   External Rotation AAROM;Both;Other (comment)  3x10  Flexion AAROM;Both   Flexion Limitations 3x10 reps     Shoulder Exercises: Standing   Other Standing Exercises wall circles 2x20   Other Standing Exercises ball on table for AAROM flexion and circles 3x10each     Shoulder Exercises: Pulleys   Flexion Other (comment)  62min   Other Pulley Exercises UE ranger for elevation and circles 3x10 each   Other Pulley Exercises wall ladder x99min     Moist Heat Therapy   Number Minutes Moist Heat 15 Minutes   Moist Heat Location Shoulder     Electrical Stimulation   Electrical Stimulation Location L shoulder   Electrical Stimulation Action premod   Electrical Stimulation Parameters 80-150hz  x48min   Electrical  Stimulation Goals Pain     Manual Therapy   Manual Therapy Passive ROM   Passive ROM PROM of L shoulder into flex/ER/IR with gentle holds at end range, rythmic stabiliazation for IR/ER in scaption, flex/ext at 90 degrees                  PT Short Term Goals - 05/25/16 1707      PT SHORT TERM GOAL #1   Title STG's=LTG's.           PT Long Term Goals - 06/10/16 0743      PT LONG TERM GOAL #1   Title Ind with advanced HEP.   Time 8   Period Weeks   Status On-going     PT LONG TERM GOAL #2   Title Active left shoulder flexion to 155 degrees so the patient can easily reach overhead   Time 8   Period Weeks   Status On-going     PT LONG TERM GOAL #3   Title Perform ADL's with pain not > 3/10.   Time 8   Period Weeks   Status On-going     PT LONG TERM GOAL #4   Title Active ER to 80 degrees+ to allow for easily donning/doffing of apparel.   Time 8   Period Weeks   Status Achieved  L shoulder AROM flexion 85 deg 06/10/2016     PT LONG TERM GOAL #5   Title Increase right shoulder strength to a solid 4+/5 to increase stability for performance of functional activities   Time 8   Period Weeks   Status On-going               Plan - 06/27/16 0750    Clinical Impression Statement Patient progressing overall with no reported pain pre, during or post treatment. Patient has minimal tenderness with P/AAROM. Patient continues with AAROM with no difficulty. Patient continues to report that he is doing AROM and ADL's at home. Educated patient on protocol per MPT to avoid injury. Patient requested HP/ES after ther ex today. Patient current goals ongoing due to protocol limitations.    Rehab Potential Excellent   Clinical Impairments Affecting Rehab Potential surgery 04/19/16 current 10 weeks 06/28/16   PT Frequency 3x / week   PT Duration 4 weeks   PT Treatment/Interventions ADLs/Self Care Home Management;Cryotherapy;Electrical Stimulation;Moist  Heat;Ultrasound;Therapeutic activities;Therapeutic exercise;Neuromuscular re-education;Patient/family education;Manual techniques;Passive range of motion   PT Next Visit Plan Continue per protocol with possible advancement to RW4 and supine AROM after 06/28/2016 per MPT   Consulted and Agree with Plan of Care Patient      Patient will benefit from skilled therapeutic intervention in order to improve the following deficits and impairments:  Pain, Decreased activity tolerance, Decreased range of motion  Visit Diagnosis: Acute pain of right shoulder  Stiffness of left shoulder, not elsewhere classified     Problem List Patient Active Problem List   Diagnosis Date Noted  . Chest pain 09/26/2015  . Elevated TSH 05/08/2015  . S/P CABG x 4   . Coronary artery disease involving native coronary artery of native heart with unstable angina pectoris ([place])   . Bilateral carotid artery disease ([place]) 04/28/2015  . BMI 28.0-28.9,adult   . Unstable angina ([place]) 03/18/2014  . Type 2 diabetes mellitus without complication, without long-term current use of insulin ([place]) 09/21/2013  . COPD (chronic obstructive pulmonary disease) ([place]) 09/21/2013  . PTSD (post-traumatic stress disorder) 09/21/2013  . CAD (coronary artery disease) 04/16/2013  . HTN (hypertension) 04/16/2013  . Hyperlipidemia 04/16/2013    Asani Deniston P, PTA 06/27/2016, 8:52 AM  [facility] [place], [place], [address] Phone: [phone]   Fax:  [phone]  Name: FATHI DESCHEPPER MRN: [id number] Date of Birth: [birth year]

## 2016-06-29 ENCOUNTER — Ambulatory Visit: Payer: Medicare Other | Admitting: Physical Therapy

## 2016-06-29 DIAGNOSIS — M25612 Stiffness of left shoulder, not elsewhere classified: Secondary | ICD-10-CM | POA: Diagnosis not present

## 2016-06-29 DIAGNOSIS — M25511 Pain in right shoulder: Secondary | ICD-10-CM | POA: Diagnosis not present

## 2016-06-29 NOTE — Therapy (Signed)
[facility] [place], [place], [address] Phone: [phone]   Fax:  [phone]  Physical Therapy Treatment  Patient Details  Name: Eduardo [name] MRN: [id number] Date of Birth: 05-15-1946 Referring Provider: Justice Britain MD  Encounter Date: 06/29/2016      PT End of Session - 06/29/16 1154    Visit Number 12   Number of Visits 16   Date for PT Re-Evaluation 07/24/16   PT Start Time [id number]      Past Medical History:  Diagnosis Date  . Bilateral carotid artery disease ([place]) 04/28/2015   1-39 percent bilateral stenosis noted on Doppler   . CAD (coronary artery disease)   . Cervical disc disease   . Chemical exposure    agent orange   . COPD (chronic obstructive pulmonary disease) ([place])   . Diabetes mellitus without complication ([place])   . Diverticulitis   . Hypertension   . Lumbar disc disease   . Obesity (BMI 30-39.9)   . PTSD (post-traumatic stress disorder)   . Sleep apnea     Past Surgical History:  Procedure Laterality Date  . CARDIAC CATHETERIZATION    . CARDIAC CATHETERIZATION  03/18/14   difficult to determine culprit vesel  . CARDIAC CATHETERIZATION N/A 04/29/2015   Procedure: Left Heart Cath and Coronary Angiography;  Surgeon: [name], MD;  Location: [place] [facility];  Service: Cardiovascular;  Laterality: N/A;  . CERVICAL SPINE SURGERY    . CHOLECYSTECTOMY    . CORONARY ARTERY BYPASS GRAFT N/A 05/04/2015   Procedure: CORONARY ARTERY BYPASS GRAFTING (CABG)  x four,  using left internal mammary artery, and bilateral thigh greater saphenous veins;  Surgeon: [name], MD;  Location: [place];  Service: Open Heart Surgery;  Laterality: N/A;  . EYE SURGERY Right    cataract  . I&D EXTREMITY Left 05/30/2013   Procedure: IRRIGATION AND DEBRIDEMENT Left Hand and Foreign body removal;  Surgeon: [name], MD;  Location: [place];  Service: Orthopedics;  Laterality: Left;  . LEFT  HEART CATHETERIZATION WITH CORONARY ANGIOGRAM N/A 03/18/2014   Procedure: LEFT HEART CATHETERIZATION WITH CORONARY ANGIOGRAM;  Surgeon: [name], MD;  Location: [facility] CATH LAB;  Service: Cardiovascular;  Laterality: N/A;  . LUMBAR LAMINECTOMY    . PARTIAL COLECTOMY     For diverticulitis  . PTCA     unsuccesful  . REPLACEMENT TOTAL KNEE     right   . SHOULDER ARTHROSCOPY    . SHOULDER SURGERY Bilateral   . TEE WITHOUT CARDIOVERSION N/A 05/04/2015   Procedure: TRANSESOPHAGEAL ECHOCARDIOGRAM (TEE);  Surgeon: [name], MD;  Location: [place];  Service: Open Heart Surgery;  Laterality: N/A;    There were no vitals filed for this visit.      Subjective Assessment - 06/29/16 1154    Subjective I'm sore but doing well.   Patient Stated Goals Use my arm without pain.   Pain Score 2    Pain Location Shoulder   Pain Orientation Left   Pain Descriptors / Indicators Sore   Pain Type Surgical pain   Pain Onset More than a month ago    Treatment:  UBE at 120 RPM's x 10 minutes f/ UE Ranger and wall ladder (7 minutes total) f/b PROM to patient's right shoulder with focus on IR stretching x 6 minutes f/b HMP and IFC x 15 minutes.  Progress with strengthening.  PT Short Term Goals - 05/25/16 1707      PT SHORT TERM GOAL #1   Title STG's=LTG's.           PT Long Term Goals - 06/10/16 0743      PT LONG TERM GOAL #1   Title Ind with advanced HEP.   Time 8   Period Weeks   Status On-going     PT LONG TERM GOAL #2   Title Active left shoulder flexion to 155 degrees so the patient can easily reach overhead   Time 8   Period Weeks   Status On-going     PT LONG TERM GOAL #3   Title Perform ADL's with pain not > 3/10.   Time 8   Period Weeks   Status On-going     PT LONG TERM GOAL #4   Title Active ER to 80 degrees+ to allow for easily donning/doffing of apparel.   Time 8   Period Weeks   Status Achieved  L  shoulder AROM flexion 85 deg 06/10/2016     PT LONG TERM GOAL #5   Title Increase right shoulder strength to a solid 4+/5 to increase stability for performance of functional activities   Time 8   Period Weeks   Status On-going             Patient will benefit from skilled therapeutic intervention in order to improve the following deficits and impairments:  Pain, Decreased activity tolerance, Decreased range of motion  Visit Diagnosis: Acute pain of right shoulder  Stiffness of left shoulder, not elsewhere classified     Problem List Patient Active Problem List   Diagnosis Date Noted  . Chest pain 09/26/2015  . Elevated TSH 05/08/2015  . S/P CABG x 4   . Coronary artery disease involving native coronary artery of native heart with unstable angina pectoris ([place])   . Bilateral carotid artery disease ([place]) 04/28/2015  . BMI 28.0-28.9,adult   . Unstable angina ([place]) 03/18/2014  . Type 2 diabetes mellitus without complication, without long-term current use of insulin ([place]) 09/21/2013  . COPD (chronic obstructive pulmonary disease) ([place]) 09/21/2013  . PTSD (post-traumatic stress disorder) 09/21/2013  . CAD (coronary artery disease) 04/16/2013  . HTN (hypertension) 04/16/2013  . Hyperlipidemia 04/16/2013    Anea Fodera, [place] MPT 06/29/2016, 12:05 PM  [facility] [address] [place], [place], [address] Phone: [phone]   Fax:  [phone]  Name: Eduardo [name] MRN: [id number] Date of Birth: 03/20/1946

## 2016-07-01 ENCOUNTER — Encounter: Payer: Self-pay | Admitting: Physical Therapy

## 2016-07-01 ENCOUNTER — Ambulatory Visit: Payer: Medicare Other | Attending: Orthopedic Surgery | Admitting: Physical Therapy

## 2016-07-01 DIAGNOSIS — M25612 Stiffness of left shoulder, not elsewhere classified: Secondary | ICD-10-CM | POA: Diagnosis not present

## 2016-07-01 DIAGNOSIS — M25512 Pain in left shoulder: Secondary | ICD-10-CM | POA: Insufficient documentation

## 2016-07-01 DIAGNOSIS — M25511 Pain in right shoulder: Secondary | ICD-10-CM | POA: Insufficient documentation

## 2016-07-01 NOTE — Therapy (Signed)
[facility] [place], [place], [address] Phone: [phone]   Fax:  [phone]  Physical Therapy Treatment  Patient Details  Name: Eduardo [name] MRN: [id number] Date of Birth: [birth year] Referring Provider: Justice Britain MD  Encounter Date: 07/01/2016      PT End of Session - 07/01/16 0733    Visit Number 13   Number of Visits 16   Date for PT Re-Evaluation 07/24/16   PT Start Time 0730   PT Stop Time 0820   PT Time Calculation (min) 50 min   Activity Tolerance Patient tolerated treatment well   Behavior During Therapy [facility] for tasks assessed/performed      Past Medical History:  Diagnosis Date  . Bilateral carotid artery disease ([place]) 04/28/2015   1-39 percent bilateral stenosis noted on Doppler   . CAD (coronary artery disease)   . Cervical disc disease   . Chemical exposure    agent orange   . COPD (chronic obstructive pulmonary disease) ([employer])   . Diabetes mellitus without complication ([place])   . Diverticulitis   . Hypertension   . Lumbar disc disease   . Obesity (BMI 30-39.9)   . PTSD (post-traumatic stress disorder)   . Sleep apnea     Past Surgical History:  Procedure Laterality Date  . CARDIAC CATHETERIZATION    . CARDIAC CATHETERIZATION  03/18/14   difficult to determine culprit vesel  . CARDIAC CATHETERIZATION N/A 04/29/2015   Procedure: Left Heart Cath and Coronary Angiography;  Surgeon: [name], MD;  Location: [place] CV LAB;  Service: Cardiovascular;  Laterality: N/A;  . CERVICAL SPINE SURGERY    . CHOLECYSTECTOMY    . CORONARY ARTERY BYPASS GRAFT N/A 05/04/2015   Procedure: CORONARY ARTERY BYPASS GRAFTING (CABG)  x four,  using left internal mammary artery, and bilateral thigh greater saphenous veins;  Surgeon: [name], MD;  Location: [facility];  Service: Open Heart Surgery;  Laterality: N/A;  . EYE SURGERY Right    cataract  . I&D EXTREMITY Left 05/30/2013   Procedure: IRRIGATION AND DEBRIDEMENT Left Hand and Foreign body removal;  Surgeon: [name], MD;  Location: [place];  Service: Orthopedics;  Laterality: Left;  . LEFT HEART CATHETERIZATION WITH CORONARY ANGIOGRAM N/A 03/18/2014   Procedure: LEFT HEART CATHETERIZATION WITH CORONARY ANGIOGRAM;  Surgeon: [name], MD;  Location: [facility] CATH LAB;  Service: Cardiovascular;  Laterality: N/A;  . LUMBAR LAMINECTOMY    . PARTIAL COLECTOMY     For diverticulitis  . PTCA     unsuccesful  . REPLACEMENT TOTAL KNEE     right   . SHOULDER ARTHROSCOPY    . SHOULDER SURGERY Bilateral   . TEE WITHOUT CARDIOVERSION N/A 05/04/2015   Procedure: TRANSESOPHAGEAL ECHOCARDIOGRAM (TEE);  Surgeon: [name], MD;  Location: [place];  Service: Open Heart Surgery;  Laterality: N/A;    There were no vitals filed for this visit.      Subjective Assessment - 07/01/16 0731    Subjective Reports having thumb surgery 07/11/2016.   Patient Stated Goals Use my arm without pain.   Currently in Pain? Yes   Pain Score 2    Pain Location Shoulder   Pain Orientation Left   Pain Descriptors / Indicators Tender   Pain Type Surgical pain   Pain Onset More than a month ago   Aggravating Factors  Certain movements, catch at approx. 90 deg  [facility] PT Assessment - 07/01/16 0001      Assessment   Medical Diagnosis Left rotator cuff repair.   Onset Date/Surgical Date 04/19/16   Next MD Visit 07/20/2016     ROM / Strength   AROM / PROM / Strength AROM     AROM   Overall AROM  Within functional limits for tasks performed   AROM Assessment Site Shoulder   Right/Left Shoulder Left   Left Shoulder Internal Rotation 76 Degrees   Left Shoulder External Rotation 90 Degrees                     [facility] Adult PT Treatment/Exercise - 07/01/16 0001      Shoulder Exercises: Supine   Protraction AROM;Left;20 reps   Flexion AROM;Left;20 reps     Shoulder Exercises: Prone   Retraction  AROM;Left;20 reps   Flexion AROM;Left;20 reps   Extension AROM;Left;20 reps   Horizontal ABduction 1 AROM;Left;20 reps     Shoulder Exercises: Sidelying   External Rotation AROM;Left;20 reps     Shoulder Exercises: Standing   Internal Rotation AAROM;Both;20 reps     Shoulder Exercises: Pulleys   Flexion Other (comment)  x5 min   Other Pulley Exercises UE ranger for elevation and circles 3x10 each   Other Pulley Exercises Wall slides x20 reps     Modalities   Modalities Electrical Stimulation;Moist Heat     Moist Heat Therapy   Number Minutes Moist Heat 15 Minutes   Moist Heat Location Shoulder     Electrical Stimulation   Electrical Stimulation Location L shoulder   Electrical Stimulation Action Pre-Mod   Electrical Stimulation Parameters 80-150 hz x15 min   Electrical Stimulation Goals Pain     Manual Therapy   Manual Therapy Passive ROM   Passive ROM PROM of L shoulder into flex/ER/IR with gentle holds at end range, rythmic stabiliazation for IR/ER in scaption, flex/ext at 90 degrees                  PT Short Term Goals - 05/25/16 1707      PT SHORT TERM GOAL #1   Title STG's=LTG's.           PT Long Term Goals - 06/10/16 0743      PT LONG TERM GOAL #1   Title Ind with advanced HEP.   Time 8   Period Weeks   Status On-going     PT LONG TERM GOAL #2   Title Active left shoulder flexion to 155 degrees so the patient can easily reach overhead   Time 8   Period Weeks   Status On-going     PT LONG TERM GOAL #3   Title Perform ADL's with pain not > 3/10.   Time 8   Period Weeks   Status On-going     PT LONG TERM GOAL #4   Title Active ER to 80 degrees+ to allow for easily donning/doffing of apparel.   Time 8   Period Weeks   Status Achieved  L shoulder AROM flexion 85 deg 06/10/2016     PT LONG TERM GOAL #5   Title Increase right shoulder strength to a solid 4+/5 to increase stability for performance of functional activities   Time 8    Period Weeks   Status On-going               Plan - 07/01/16 0805    Clinical Impression Statement Patient progressed well into AROM exercises  with no complaints from patient upon arrival or during the exercises. Patient experienced tenderness upon arrival but also experienced a catch like sensation in supine flexion at approximately 90 deg per patient report. Patient did not demonstrate any L shoulder compensatory strategies with any of the AROM exercises. Good L shoulder ROM noted with PROM in supine today. AROM L shoulder IR measured as 76 deg, ER 90 deg. Normal modalities response noted following removal of the modalities.   Rehab Potential Excellent   Clinical Impairments Affecting Rehab Potential surgery 04/19/16 current 10 weeks 06/28/16   PT Frequency 3x / week   PT Duration 4 weeks   PT Treatment/Interventions ADLs/Self Care Home Management;Cryotherapy;Electrical Stimulation;Moist Heat;Ultrasound;Therapeutic activities;Therapeutic exercise;Neuromuscular re-education;Patient/family education;Manual techniques;Passive range of motion   PT Next Visit Plan Continue per protocol with possible advancement to RW4 and supine AROM after 06/28/2016 per MPT   Consulted and Agree with Plan of Care Patient      Patient will benefit from skilled therapeutic intervention in order to improve the following deficits and impairments:  Pain, Decreased activity tolerance, Decreased range of motion  Visit Diagnosis: Acute pain of right shoulder  Stiffness of left shoulder, not elsewhere classified     Problem List Patient Active Problem List   Diagnosis Date Noted  . Chest pain 09/26/2015  . Elevated TSH 05/08/2015  . S/P CABG x 4   . Coronary artery disease involving native coronary artery of native heart with unstable angina pectoris ([place])   . Bilateral carotid artery disease ([place]) 04/28/2015  . BMI 28.0-28.9,adult   . Unstable angina ([place]) 03/18/2014  . Type 2 diabetes mellitus  without complication, without long-term current use of insulin ([place]) 09/21/2013  . COPD (chronic obstructive pulmonary disease) ([place]) 09/21/2013  . PTSD (post-traumatic stress disorder) 09/21/2013  . CAD (coronary artery disease) 04/16/2013  . HTN (hypertension) 04/16/2013  . Hyperlipidemia 04/16/2013    Wynelle Fanny, PTA 07/01/2016, 8:25 AM  [facility] [address] [place], [place], [address] Phone: [phone]   Fax:  [phone]  Name: Eduardo [name] MRN: [id number] Date of Birth: 1946-03-24

## 2016-07-03 ENCOUNTER — Other Ambulatory Visit: Payer: Self-pay | Admitting: Pediatrics

## 2016-07-05 ENCOUNTER — Ambulatory Visit: Payer: Medicare Other | Admitting: Physical Therapy

## 2016-07-05 ENCOUNTER — Telehealth: Payer: Self-pay | Admitting: Cardiovascular Disease

## 2016-07-05 DIAGNOSIS — M25512 Pain in left shoulder: Secondary | ICD-10-CM | POA: Diagnosis not present

## 2016-07-05 DIAGNOSIS — M25511 Pain in right shoulder: Secondary | ICD-10-CM | POA: Diagnosis not present

## 2016-07-05 DIAGNOSIS — M25612 Stiffness of left shoulder, not elsewhere classified: Secondary | ICD-10-CM | POA: Diagnosis not present

## 2016-07-05 MED ORDER — BENAZEPRIL HCL 40 MG PO TABS: 40.0000 mg | ORAL_TABLET | Freq: Every day | ORAL | 1 refills | Status: DC

## 2016-07-05 NOTE — Telephone Encounter (Signed)
Patient notified to increase Benazepril to 40mg  daily. New script sent to [facility].

## 2016-07-05 NOTE — Telephone Encounter (Signed)
Patient says his psychiatrist increased his Cymbalta from 20mg  daily to 20mg  twice daily about 2 weeks ago & it is causing his b/p to be elevated. He says it is running from 160 /98 to 186/114. He spoke with his PCP at the [facility] clinic this morning & they recommended he call us to possibly increase his Benazepril.

## 2016-07-05 NOTE — Telephone Encounter (Signed)
Patient would like to speak to someone about his medications.

## 2016-07-05 NOTE — Therapy (Addendum)
[facility] [place], [place], [address] Phone: [phone]   Fax:  [phone]  Physical Therapy Treatment/ Discharge Summary  Patient Details  Name: Eduardo [name] MRN: [id number] Date of Birth: 09/13/1945 Referring Provider: Justice Britain MD  Encounter Date: 07/05/2016    Past Medical History:  Diagnosis Date  . Arthritis   . Bilateral carotid artery disease ([place]) 04/28/2015   1-39 percent bilateral stenosis noted on Doppler   . CAD (coronary artery disease)   . Cervical disc disease   . Chemical exposure    agent orange   . COPD (chronic obstructive pulmonary disease) ([place])   . Diabetes mellitus without complication ([place])   . Diverticulitis   . Hypertension   . Lumbar disc disease   . Obesity (BMI 30-39.9)   . Peripheral neuropathy    in all extremities  . PTSD (post-traumatic stress disorder)   . Sleep apnea   . Thoracic ascending aortic aneurysm [facility])     Past Surgical History:  Procedure Laterality Date  . BACK SURGERY    . CARDIAC CATHETERIZATION    . CARDIAC CATHETERIZATION  03/18/14   difficult to determine culprit vesel  . CARDIAC CATHETERIZATION N/A 04/29/2015   Procedure: Left Heart Cath and Coronary Angiography;  Surgeon: [name], MD;  Location: [place] CV LAB;  Service: Cardiovascular;  Laterality: N/A;  . CARPOMETACARPAL ([place]) FUSION OF THUMB Right 08/04/2016   Procedure: CARPOMETACARPAL [facility]) FUSION OF THUMB;  Surgeon: Iran Planas, MD;  Location: [place];  Service: Orthopedics;  Laterality: Right;  . CERVICAL SPINE SURGERY    . CHOLECYSTECTOMY    . CORONARY ARTERY BYPASS GRAFT N/A 05/04/2015   Procedure: CORONARY ARTERY BYPASS GRAFTING (CABG)  x four,  using left internal mammary artery, and bilateral thigh greater saphenous veins;  Surgeon: [name], MD;  Location: [place];  Service: Open Heart Surgery;  Laterality: N/A;  . EYE SURGERY Right    cataract  . I&D  EXTREMITY Left 05/30/2013   Procedure: IRRIGATION AND DEBRIDEMENT Left Hand and Foreign body removal;  Surgeon: [name], MD;  Location: [place];  Service: Orthopedics;  Laterality: Left;  . JOINT REPLACEMENT    . LEFT HEART CATHETERIZATION WITH CORONARY ANGIOGRAM N/A 03/18/2014   Procedure: LEFT HEART CATHETERIZATION WITH CORONARY ANGIOGRAM;  Surgeon: [name], MD;  Location: [facility] CATH LAB;  Service: Cardiovascular;  Laterality: N/A;  . LUMBAR LAMINECTOMY    . PARTIAL COLECTOMY     For diverticulitis  . PTCA     unsuccesful  . REPLACEMENT TOTAL KNEE     right   . SHOULDER ARTHROSCOPY    . SHOULDER SURGERY Bilateral   . TEE WITHOUT CARDIOVERSION N/A 05/04/2015   Procedure: TRANSESOPHAGEAL ECHOCARDIOGRAM (TEE);  Surgeon: [name], MD;  Location: [place];  Service: Open Heart Surgery;  Laterality: N/A;    There were no vitals filed for this visit.                                 PT Short Term Goals - 07/05/16 0955      PT SHORT TERM GOAL #1   Title STG's=LTG's.   Status Achieved           PT Long Term Goals - 07/05/16 0955      PT LONG TERM GOAL #1   Title Ind with advanced HEP.   Period Weeks  Status Achieved     PT LONG TERM GOAL #2   Title Active left shoulder flexion to 155 degrees so the patient can easily reach overhead   Baseline 155 degrees 07/05/16   Status Achieved     PT LONG TERM GOAL #3   Title Perform ADL's with pain not > 3/10.   Baseline pt reporting 0 pain with ADL's   Time 8   Period Weeks   Status Achieved     PT LONG TERM GOAL #4   Title Active ER to 80 degrees+ to allow for easily donning/doffing of apparel.   Baseline ER to 90 degrees   Period Weeks   Status Achieved     PT LONG TERM GOAL #5   Title Increase right shoulder strength to a solid 4+/5 to increase stability for performance of functional activities   Baseline Pt is grossly 4+/5 in L shoulder   Time 8   Period Weeks   Status --              Patient will benefit from skilled therapeutic intervention in order to improve the following deficits and impairments:  Pain, Decreased activity tolerance, Decreased range of motion  Visit Diagnosis: Stiffness of left shoulder, not elsewhere classified  Acute pain of left shoulder  PHYSICAL THERAPY DISCHARGE SUMMARY  Visits from Start of Care: 14  Current functional level related to goals / functional outcomes: See above goals   Remaining deficits: pt still progressing with L shoulder strength to 5/5.    Education / Equipment: HEP Plan: Patient agrees to discharge.  Patient goals were met. Patient is being discharged due to meeting the stated rehab goals.  ?????        Problem List Patient Active Problem List   Diagnosis Date Noted  . Expressive aphasia 07/15/2016  . Chest pain 09/26/2015  . Elevated TSH 05/08/2015  . S/P CABG x 4   . Bilateral carotid artery disease ([place]) 04/28/2015  . BMI 28.0-28.9,adult   . Unstable angina ([place]) 03/18/2014  . Type 2 diabetes mellitus without complication, without long-term current use of insulin ([place]) 09/21/2013  . COPD (chronic obstructive pulmonary disease) ([place]) 09/21/2013  . PTSD (post-traumatic stress disorder) 09/21/2013  . CAD (coronary artery disease) 04/16/2013  . HTN (hypertension) 04/16/2013  . Hyperlipidemia 04/16/2013    [place], [place], MPT 11/10/2016, 9:27 AM  [facility] [address] [place], [place], [address] Phone: [phone]   Fax:  [phone]  Name: Eduardo [name] MRN: [id number] Date of Birth: 11-16-1945

## 2016-07-05 NOTE — Telephone Encounter (Signed)
Increase benazepril to 40 mg daily.

## 2016-07-05 NOTE — Addendum Note (Signed)
Addended by: Caroleen Hamman A on: 07/05/2016 02:19 PM   Modules accepted: Orders

## 2016-07-12 ENCOUNTER — Telehealth: Payer: Self-pay | Admitting: Cardiovascular Disease

## 2016-07-12 NOTE — Telephone Encounter (Signed)
Pt BP today was 144/91 - says diastaltic BP remains in the 90s since increase of Benazepril - routed to Dr. Bronson Ing

## 2016-07-12 NOTE — Telephone Encounter (Signed)
Patient would like to know how long it should take for his BP to become better after medication changes

## 2016-07-13 NOTE — Telephone Encounter (Signed)
BP today 140/91 says HR has remained in the 80s

## 2016-07-14 MED ORDER — CARVEDILOL 12.5 MG PO TABS: 12.5000 mg | ORAL_TABLET | Freq: Two times a day (BID) | ORAL | 3 refills | Status: DC

## 2016-07-14 NOTE — Telephone Encounter (Signed)
Eduardo Commons, MD  Eduardo [name], CMA        Then increase Coreg to 12.5 mg bid (assuming he's currently taking 6.25 mg bid)    Pt confirmed he was taking 6.25 mg bid - voiced understanding of dose increase. New rx sent to pharmacy as requested.

## 2016-07-15 ENCOUNTER — Encounter (HOSPITAL_COMMUNITY): Payer: Self-pay | Admitting: Emergency Medicine

## 2016-07-15 ENCOUNTER — Emergency Department (HOSPITAL_COMMUNITY): Payer: Medicare Other

## 2016-07-15 ENCOUNTER — Observation Stay (HOSPITAL_COMMUNITY)
Admission: EM | Admit: 2016-07-15 | Discharge: 2016-07-16 | Disposition: A | Discharge status: Home / Self Care | Payer: Medicare Other | Attending: Internal Medicine | Admitting: Internal Medicine

## 2016-07-15 ENCOUNTER — Ambulatory Visit (INDEPENDENT_AMBULATORY_CARE_PROVIDER_SITE_OTHER): Payer: Medicare Other | Admitting: Pediatrics

## 2016-07-15 ENCOUNTER — Observation Stay (HOSPITAL_COMMUNITY): Payer: Medicare Other

## 2016-07-15 VITALS — BP 155/89 | HR 63

## 2016-07-15 DIAGNOSIS — R109 Unspecified abdominal pain: Secondary | ICD-10-CM

## 2016-07-15 DIAGNOSIS — R4182 Altered mental status, unspecified: Secondary | ICD-10-CM

## 2016-07-15 DIAGNOSIS — I10 Essential (primary) hypertension: Secondary | ICD-10-CM

## 2016-07-15 DIAGNOSIS — R410 Disorientation, unspecified: Secondary | ICD-10-CM

## 2016-07-15 DIAGNOSIS — E11649 Type 2 diabetes mellitus with hypoglycemia without coma: Secondary | ICD-10-CM | POA: Insufficient documentation

## 2016-07-15 DIAGNOSIS — I2510 Atherosclerotic heart disease of native coronary artery without angina pectoris: Secondary | ICD-10-CM | POA: Diagnosis not present

## 2016-07-15 DIAGNOSIS — J449 Chronic obstructive pulmonary disease, unspecified: Secondary | ICD-10-CM | POA: Diagnosis not present

## 2016-07-15 DIAGNOSIS — Z7982 Long term (current) use of aspirin: Secondary | ICD-10-CM | POA: Diagnosis not present

## 2016-07-15 DIAGNOSIS — R4701 Aphasia: Secondary | ICD-10-CM | POA: Diagnosis present

## 2016-07-15 DIAGNOSIS — Z79899 Other long term (current) drug therapy: Secondary | ICD-10-CM | POA: Insufficient documentation

## 2016-07-15 DIAGNOSIS — K760 Fatty (change of) liver, not elsewhere classified: Secondary | ICD-10-CM | POA: Diagnosis not present

## 2016-07-15 DIAGNOSIS — R9431 Abnormal electrocardiogram [ECG] [EKG]: Secondary | ICD-10-CM | POA: Diagnosis not present

## 2016-07-15 DIAGNOSIS — Z7984 Long term (current) use of oral hypoglycemic drugs: Secondary | ICD-10-CM | POA: Insufficient documentation

## 2016-07-15 DIAGNOSIS — I25119 Atherosclerotic heart disease of native coronary artery with unspecified angina pectoris: Secondary | ICD-10-CM

## 2016-07-15 DIAGNOSIS — E119 Type 2 diabetes mellitus without complications: Secondary | ICD-10-CM

## 2016-07-15 DIAGNOSIS — R1011 Right upper quadrant pain: Secondary | ICD-10-CM | POA: Insufficient documentation

## 2016-07-15 DIAGNOSIS — G459 Transient cerebral ischemic attack, unspecified: Principal | ICD-10-CM | POA: Insufficient documentation

## 2016-07-15 LAB — CBC WITH DIFFERENTIAL/PLATELET
BASOS ABS: 0 10*3/uL (ref 0.0–0.1)
Basophils Relative: 1 %
EOS ABS: 0.2 10*3/uL (ref 0.0–0.7)
EOS PCT: 4 %
HCT: 43.1 % (ref 39.0–52.0)
HEMOGLOBIN: 14.9 g/dL (ref 13.0–17.0)
LYMPHS PCT: 28 %
Lymphs Abs: 1.7 10*3/uL (ref 0.7–4.0)
MCH: 31.1 pg (ref 26.0–34.0)
MCHC: 34.6 g/dL (ref 30.0–36.0)
MCV: 90 fL (ref 78.0–100.0)
Monocytes Absolute: 0.4 10*3/uL (ref 0.1–1.0)
Monocytes Relative: 7 %
NEUTROS PCT: 60 %
Neutro Abs: 3.5 10*3/uL (ref 1.7–7.7)
PLATELETS: 204 10*3/uL (ref 150–400)
RBC: 4.79 MIL/uL (ref 4.22–5.81)
RDW: 12.9 % (ref 11.5–15.5)
WBC: 5.9 10*3/uL (ref 4.0–10.5)

## 2016-07-15 LAB — LIPASE, BLOOD: LIPASE: 42 U/L (ref 11–51)

## 2016-07-15 LAB — COMPREHENSIVE METABOLIC PANEL
ALT: 26 U/L (ref 17–63)
AST: 24 U/L (ref 15–41)
Albumin: 4 g/dL (ref 3.5–5.0)
Alkaline Phosphatase: 40 U/L (ref 38–126)
Anion gap: 8 (ref 5–15)
BILIRUBIN TOTAL: 0.6 mg/dL (ref 0.3–1.2)
BUN: 21 mg/dL — AB (ref 6–20)
CO2: 26 mmol/L (ref 22–32)
Calcium: 9.1 mg/dL (ref 8.9–10.3)
Chloride: 106 mmol/L (ref 101–111)
Creatinine, Ser: 0.97 mg/dL (ref 0.61–1.24)
Glucose, Bld: 96 mg/dL (ref 65–99)
Potassium: 4.4 mmol/L (ref 3.5–5.1)
Sodium: 140 mmol/L (ref 135–145)
TOTAL PROTEIN: 6.8 g/dL (ref 6.5–8.1)

## 2016-07-15 LAB — URINALYSIS, ROUTINE W REFLEX MICROSCOPIC
BILIRUBIN URINE: NEGATIVE
Glucose, UA: NEGATIVE mg/dL
HGB URINE DIPSTICK: NEGATIVE
KETONES UR: NEGATIVE mg/dL
Leukocytes, UA: NEGATIVE
NITRITE: NEGATIVE
PROTEIN: NEGATIVE mg/dL
Specific Gravity, Urine: 1.019 (ref 1.005–1.030)
pH: 6 (ref 5.0–8.0)

## 2016-07-15 LAB — GLUCOSE HEMOCUE WAIVED
GLU HEMOCUE WAIVED: 122 mg/dL — AB (ref 65–99)
Glu Hemocue Waived: 69 mg/dL (ref 65–99)

## 2016-07-15 LAB — CBG MONITORING, ED
GLUCOSE-CAPILLARY: 97 mg/dL (ref 65–99)
Glucose-Capillary: 93 mg/dL (ref 65–99)

## 2016-07-15 LAB — TROPONIN I: Troponin I: <0.03 ng/mL (ref <0.03)

## 2016-07-15 MED ORDER — ACETAMINOPHEN 160 MG/5ML PO SOLN: 650.0000 mg | ORAL | Status: DC | PRN

## 2016-07-15 MED ORDER — EZETIMIBE 10 MG PO TABS
10.0000 mg | ORAL_TABLET | Freq: Every day | ORAL | Status: DC
  Filled 2016-07-15: qty 1

## 2016-07-15 MED ORDER — BENAZEPRIL HCL 10 MG PO TABS
40.0000 mg | ORAL_TABLET | Freq: Every day | ORAL | Status: DC
  Filled 2016-07-15: qty 4

## 2016-07-15 MED ORDER — ENOXAPARIN SODIUM 40 MG/0.4ML ~~LOC~~ SOLN
40.0000 mg | SUBCUTANEOUS | Status: DC
  Administered 2016-07-15: 40 mg via SUBCUTANEOUS
  Filled 2016-07-15: qty 0.4

## 2016-07-15 MED ORDER — STROKE: EARLY STAGES OF RECOVERY BOOK
Freq: Once | Status: DC
  Filled 2016-07-15: qty 1

## 2016-07-15 MED ORDER — METFORMIN HCL 500 MG PO TABS
1000.0000 mg | ORAL_TABLET | Freq: Two times a day (BID) | ORAL | Status: DC
  Filled 2016-07-15: qty 2

## 2016-07-15 MED ORDER — ACETAMINOPHEN 325 MG PO TABS: 650.0000 mg | ORAL_TABLET | ORAL | Status: DC | PRN

## 2016-07-15 MED ORDER — ACETAMINOPHEN 650 MG RE SUPP: 650.0000 mg | RECTAL | Status: DC | PRN

## 2016-07-15 MED ORDER — SODIUM CHLORIDE 0.9 % IV SOLN
INTRAVENOUS | Status: DC
  Administered 2016-07-15 – 2016-07-16 (×2): via INTRAVENOUS

## 2016-07-15 MED ORDER — ASPIRIN EC 81 MG PO TBEC
81.0000 mg | DELAYED_RELEASE_TABLET | Freq: Every day | ORAL | Status: DC
  Filled 2016-07-15: qty 1

## 2016-07-15 MED ORDER — LEVOTHYROXINE SODIUM 25 MCG PO TABS
25.0000 ug | ORAL_TABLET | Freq: Every day | ORAL | Status: DC
  Filled 2016-07-15: qty 1

## 2016-07-15 MED ORDER — CARVEDILOL 12.5 MG PO TABS
12.5000 mg | ORAL_TABLET | Freq: Two times a day (BID) | ORAL | Status: DC
  Filled 2016-07-15 (×2): qty 1

## 2016-07-15 MED ORDER — DULOXETINE HCL 20 MG PO CPEP
20.0000 mg | ORAL_CAPSULE | Freq: Two times a day (BID) | ORAL | Status: DC
  Filled 2016-07-15 (×6): qty 1

## 2016-07-15 MED ORDER — ALBUTEROL SULFATE (2.5 MG/3ML) 0.083% IN NEBU: 3.0000 mL | INHALATION_SOLUTION | Freq: Four times a day (QID) | RESPIRATORY_TRACT | Status: DC | PRN

## 2016-07-15 MED ORDER — GEMFIBROZIL 600 MG PO TABS
600.0000 mg | ORAL_TABLET | Freq: Two times a day (BID) | ORAL | Status: DC
  Filled 2016-07-15: qty 1

## 2016-07-15 MED ORDER — SENNOSIDES-DOCUSATE SODIUM 8.6-50 MG PO TABS: 1.0000 | ORAL_TABLET | Freq: Every evening | ORAL | Status: DC | PRN

## 2016-07-15 MED ORDER — CLOPIDOGREL BISULFATE 75 MG PO TABS
75.0000 mg | ORAL_TABLET | Freq: Every day | ORAL | Status: DC
  Filled 2016-07-15: qty 1

## 2016-07-15 NOTE — ED Provider Notes (Signed)
[name] DEPT Provider Note   CSN: [id number] Arrival date & time: 07/15/16  1259     History   Chief Complaint Chief Complaint  Patient presents with  . Hypoglycemia    CBG was 60   . Abnormal ECG    sent from [facility]    HPI Eduardo [name] is a 71 y.o. male.  HPI  Pt was seen at 1440. Per pt, c/o sudden onset and resolution of one episode of "confusion" that began this morning PTA. Pt states he was driving in his car to his PMD's office when he "noticed I couldn't use my cellphone." Pt states he was confused regarding how to work the phone, and was unable to speak the words he was thinking. Pt states he got to his PMD's office and "just stood there confused" and still unable to speak the words he was thinking. Pt states he "felt like I was going to pass out" but was assisted to a chair. Pt states he slowly began to feel better as he sat in the chair. CBG was "60" initially, which increased to "120" after he had soda. Symptoms did not change with improvement in CBG, so pt was sent to the ED for further evaluation. Pt states he "feels fine now." Denies "feeling shaky" (the way he usually feels when he has low CBG at home). Denies CP/palpitations, no SOB/cough, no abd pain, no N/V/D, no focal motor weakness, no tingling/numbness in extremities, no facial droop.     Past Medical History:  Diagnosis Date  . Bilateral carotid artery disease ([place]) 04/28/2015   1-39 percent bilateral stenosis noted on Doppler   . CAD (coronary artery disease)   . Cervical disc disease   . Chemical exposure    agent orange   . COPD (chronic obstructive pulmonary disease) ([place])   . Diabetes mellitus without complication ([place])   . Diverticulitis   . Hypertension   . Lumbar disc disease   . Obesity (BMI 30-39.9)   . PTSD (post-traumatic stress disorder)   . Sleep apnea     Patient Active Problem List   Diagnosis Date Noted  . Altered mental status 07/15/2016  . Chest pain 09/26/2015  .  Elevated TSH 05/08/2015  . S/P CABG x 4   . Coronary artery disease involving native coronary artery of native heart with unstable angina pectoris ([place])   . Bilateral carotid artery disease ([place]) 04/28/2015  . BMI 28.0-28.9,adult   . Unstable angina ([place]) 03/18/2014  . Type 2 diabetes mellitus without complication, without long-term current use of insulin ([place]) 09/21/2013  . COPD (chronic obstructive pulmonary disease) ([place]) 09/21/2013  . PTSD (post-traumatic stress disorder) 09/21/2013  . CAD (coronary artery disease) 04/16/2013  . HTN (hypertension) 04/16/2013  . Hyperlipidemia 04/16/2013    Past Surgical History:  Procedure Laterality Date  . CARDIAC CATHETERIZATION    . CARDIAC CATHETERIZATION  03/18/14   difficult to determine culprit vesel  . CARDIAC CATHETERIZATION N/A 04/29/2015   Procedure: Left Heart Cath and Coronary Angiography;  Surgeon: [name], MD;  Location: [facility];  Service: Cardiovascular;  Laterality: N/A;  . CERVICAL SPINE SURGERY    . CHOLECYSTECTOMY    . CORONARY ARTERY BYPASS GRAFT N/A 05/04/2015   Procedure: CORONARY ARTERY BYPASS GRAFTING (CABG)  x four,  using left internal mammary artery, and bilateral thigh greater saphenous veins;  Surgeon: [name], MD;  Location: [facility];  Service: Open Heart Surgery;  Laterality: N/A;  . EYE  SURGERY Right    cataract  . I&D EXTREMITY Left 05/30/2013   Procedure: IRRIGATION AND DEBRIDEMENT Left Hand and Foreign body removal;  Surgeon: [name], MD;  Location: [facility];  Service: Orthopedics;  Laterality: Left;  . LEFT HEART CATHETERIZATION WITH CORONARY ANGIOGRAM N/A 03/18/2014   Procedure: LEFT HEART CATHETERIZATION WITH CORONARY ANGIOGRAM;  Surgeon: [name], MD;  Location[facility] C[facility];  Service: Cardiovascular;  Laterality: N/A;  . LUMBAR LAMINECTOMY    . PARTIAL COLECTOMY     For diverticulitis  . PTCA     unsuccesful  . REPLACEMENT TOTAL KNEE     right   . SHOULDER  ARTHROSCOPY    . SHOULDER SURGERY Bilateral   . TEE WITHOUT CARDIOVERSION N/A 05/04/2015   Procedure: TRANSESOPHAGEAL ECHOCARDIOGRAM (TEE);  Surgeon: [name], MD;  Location: [facility];  Service: Open Heart Surgery;  Laterality: N/A;       Home Medications    Prior to Admission medications   Medication Sig Start Date End Date Taking? Authorizing Provider  albuterol (PROVENTIL HFA;VENTOLIN HFA) 108 (90 BASE) MCG/ACT inhaler Inhale 1 puff into the lungs every 6 (six) hours as needed for wheezing or shortness of breath.    Yes Historical Provider, MD  aspirin EC 81 MG EC tablet Take 1 tablet (81 mg total) by mouth daily. 05/10/15  Yes Wayne E Gold, PA-C  benazepril (LOTENSIN) 40 MG tablet Take 1 tablet (40 mg total) by mouth daily. 07/05/16  Yes Herminio Commons, MD  carvedilol (COREG) 12.5 MG tablet Take 1 tablet (12.5 mg total) by mouth 2 (two) times daily. 07/14/16 10/12/16 Yes Herminio Commons, MD  Cholecalciferol (VITAMIN D PO) Take 1 capsule by mouth daily.   Yes Historical Provider, MD  clopidogrel (PLAVIX) 75 MG tablet TAKE 1 TABLET DAILY 11/16/15  Yes Herminio Commons, MD  DULoxetine (CYMBALTA) 20 MG capsule Take 20 mg by mouth daily.   Yes Historical Provider, MD  ezetimibe (ZETIA) 10 MG tablet Take 10 mg by mouth daily.   Yes Historical Provider, MD  gemfibrozil (LOPID) 600 MG tablet Take 600 mg by mouth 2 (two) times daily before a meal.   Yes Historical Provider, MD  levothyroxine (SYNTHROID, LEVOTHROID) 25 MCG tablet Take 1 tablet (25 mcg total) by mouth daily before breakfast. 05/10/15  Yes Wayne E Gold, PA-C  metFORMIN (GLUCOPHAGE) 1000 MG tablet Take 1,000 mg by mouth 2 (two) times daily with a meal.   Yes Historical Provider, MD  fenofibrate micronized (LOFIBRA) 134 MG capsule TAKE 1 CAPSULE DAILY BEFORE BREAKFAST Patient not taking: Reported on 07/15/2016 07/04/16   [name], MD  nitroGLYCERIN (NITROSTAT) 0.4 MG SL tablet Place 0.4 mg under the tongue every 5  (five) minutes as needed for chest pain.    Historical Provider, MD    Family History Family History  Problem Relation Age of Onset  . Diabetes Father   . Heart disease Father   . Hypertension Father   . Diabetes Paternal Grandmother   . Heart disease Paternal Grandmother   . Hypertension Paternal Grandmother   . Diabetes Paternal Uncle   . Heart disease Paternal Uncle   . Hypertension Paternal Uncle     Social History Social History  Substance Use Topics  . Smoking status: Former Smoker    Packs/day: 0.75    Years: 30.00    Types: Cigarettes    Start date: 01/02/1961    Quit date: 05/31/1987  . Smokeless tobacco: Never  Used  . Alcohol use No     Allergies   Statins   Review of Systems Review of Systems ROS: Statement: All systems negative except as marked or noted in the HPI; Constitutional: Negative for fever and chills. ; ; Eyes: Negative for eye pain, redness and discharge. ; ; ENMT: Negative for ear pain, hoarseness, nasal congestion, sinus pressure and sore throat. ; ; Cardiovascular: Negative for chest pain, palpitations, diaphoresis, dyspnea and peripheral edema. ; ; Respiratory: Negative for cough, wheezing and stridor. ; ; Gastrointestinal: Negative for nausea, vomiting, diarrhea, abdominal pain, blood in stool, hematemesis, jaundice and rectal bleeding. . ; ; Genitourinary: Negative for dysuria, flank pain and hematuria. ; ; Musculoskeletal: Negative for back pain and neck pain. Negative for swelling and trauma.; ; Skin: Negative for pruritus, rash, abrasions, blisters, bruising and skin lesion.; ; Neuro: +lightheadedness, confusion, aphasia. Negative for headache and neck stiffness. Negative for extremity weakness, paresthesias, involuntary movement, seizure and syncope.       Physical Exam Updated Vital Signs BP 163/81 (BP Location: Left Arm)   Pulse 63   Temp 97.9 F (36.6 C) (Oral)   Resp 18   Ht 5\' 8"  (1.727 m)   Wt 188 lb (85.3 kg)   SpO2 96%   BMI  28.59 kg/m     Physical Exam 1445: Physical examination:  Nursing notes reviewed; Vital signs and O2 SAT reviewed;  Constitutional: Well developed, Well nourished, Well hydrated, In no acute distress; Head:  Normocephalic, atraumatic; Eyes: EOMI, PERRL, No scleral icterus; ENMT: Mouth and pharynx normal, Mucous membranes moist; Neck: Supple, Full range of motion, No lymphadenopathy; Cardiovascular: Regular rate and rhythm, No gallop; Respiratory: Breath sounds clear & equal bilaterally, No wheezes.  Speaking full sentences with ease, Normal respiratory effort/excursion; Chest: Nontender, Movement normal; Abdomen: Soft, Nontender, Nondistended, Normal bowel sounds; Genitourinary: No CVA tenderness; Extremities: Pulses normal, No tenderness, No edema, No calf edema or asymmetry.; Neuro: AA&Ox3, Major CN grossly intact. Speech clear.  No facial droop.  No nystagmus. Grips equal. Strength 5/5 equal bilat UE's and LE's.  DTR 2/4 equal bilat UE's and LE's.  No gross sensory deficits.  Normal cerebellar testing bilat UE's (finger-nose) and LE's (heel-shin).; Skin: Color normal, Warm, Dry.   ED Treatments / Results  Labs (all labs ordered are listed, but only abnormal results are displayed)   EKG  EKG Interpretation  Date/Time:  Friday July 15 2016 13:19:26 EST Ventricular Rate:  65 PR Interval:  216 QRS Duration: 90 QT Interval:  396 QTC Calculation: 411 R Axis:   30 Text Interpretation:  Sinus rhythm with 1st degree A-V block Otherwise normal ECG When compared with ECG of 08/09/2015 No significant change was found Confirmed by [facility]  MD, Nunzio Cory [phone]) on 07/15/2016 3:34:14 PM       Radiology   Procedures Procedures (including critical care time)  Medications Ordered in ED Medications - No data to display   Initial Impression / Assessment and Plan / ED Course  I have reviewed the triage vital signs and the nursing notes.  Pertinent labs & imaging results that were available  during my care of the patient were reviewed by me and considered in my medical decision making (see chart for details).  MDM Reviewed: previous chart, nursing note and vitals Reviewed previous: labs and ECG Interpretation: labs, ECG, x-ray and MRI   Results for orders placed or performed during the hospital encounter of 07/15/16  Comprehensive metabolic panel  Result Value Ref Range  Sodium 140 135 - 145 mmol/L   Potassium 4.4 3.5 - 5.1 mmol/L   Chloride 106 101 - 111 mmol/L   CO2 26 22 - 32 mmol/L   Glucose, Bld 96 65 - 99 mg/dL   BUN 21 (H) 6 - 20 mg/dL   Creatinine, Ser 0.97 0.61 - 1.24 mg/dL   Calcium 9.1 8.9 - 10.3 mg/dL   Total Protein 6.8 6.5 - 8.1 g/dL   Albumin 4.0 3.5 - 5.0 g/dL   AST 24 15 - 41 U/L   ALT 26 17 - 63 U/L   Alkaline Phosphatase 40 38 - 126 U/L   Total Bilirubin 0.6 0.3 - 1.2 mg/dL   GFR calc non Af Amer >60 >60 mL/min   GFR calc Af Amer >60 >60 mL/min   Anion gap 8 5 - 15  CBC with Differential  Result Value Ref Range   WBC 5.9 4.0 - 10.5 K/uL   RBC 4.79 4.22 - 5.81 MIL/uL   Hemoglobin 14.9 13.0 - 17.0 g/dL   HCT 43.1 39.0 - 52.0 %   MCV 90.0 78.0 - 100.0 fL   MCH 31.1 26.0 - 34.0 pg   MCHC 34.6 30.0 - 36.0 g/dL   RDW 12.9 11.5 - 15.5 %   Platelets 204 150 - 400 K/uL   Neutrophils Relative % 60 %   Neutro Abs 3.5 1.7 - 7.7 K/uL   Lymphocytes Relative 28 %   Lymphs Abs 1.7 0.7 - 4.0 K/uL   Monocytes Relative 7 %   Monocytes Absolute 0.4 0.1 - 1.0 K/uL   Eosinophils Relative 4 %   Eosinophils Absolute 0.2 0.0 - 0.7 K/uL   Basophils Relative 1 %   Basophils Absolute 0.0 0.0 - 0.1 K/uL  Troponin I  Result Value Ref Range   Troponin I <0.03 <0.03 ng/mL  Urinalysis, Routine w reflex microscopic  Result Value Ref Range   Color, Urine YELLOW YELLOW   APPearance HAZY (A) CLEAR   Specific Gravity, Urine 1.019 1.005 - 1.030   pH 6.0 5.0 - 8.0   Glucose, UA NEGATIVE NEGATIVE mg/dL   Hgb urine dipstick NEGATIVE NEGATIVE   Bilirubin Urine  NEGATIVE NEGATIVE   Ketones, ur NEGATIVE NEGATIVE mg/dL   Protein, ur NEGATIVE NEGATIVE mg/dL   Nitrite NEGATIVE NEGATIVE   Leukocytes, UA NEGATIVE NEGATIVE  CBG monitoring, ED  Result Value Ref Range   Glucose-Capillary 93 65 - 99 mg/dL  CBG monitoring, ED  Result Value Ref Range   Glucose-Capillary 97 65 - 99 mg/dL   Dg Chest 2 View Result Date: 07/15/2016 CLINICAL DATA:  AMS, Seen at [facility] for confusion , PATIENTS FAMILY STATES " PATIENTS BLOOD SUGAR WAS REALLY LOW THIS MORNING"HISTORY OF COPD, DM, HTN EXAM: CHEST  2 VIEW COMPARISON:  09/26/2015 FINDINGS: There changes from previous CABG surgery. The cardiac silhouette is normal in size and configuration. Normal mediastinal and hilar contours. Clear lungs.  No pleural effusion.  No pneumothorax. Skeletal structures are intact. IMPRESSION: No acute cardiopulmonary disease. Electronically Signed   By: Lajean Manes M.D.   On: 07/15/2016 15:39   Mr Brain Wo Contrast (neuro Protocol) Result Date: 07/15/2016 CLINICAL DATA:  Altered mental status. EXAM: MRI HEAD WITHOUT CONTRAST TECHNIQUE: Multiplanar, multiecho pulse sequences of the brain and surrounding structures were obtained without intravenous contrast. COMPARISON:  None. FINDINGS: Brain: No acute infarction, hemorrhage, hydrocephalus, extra-axial collection or mass lesion. No unexpected volume loss or white matter signal. Vascular: Normal flow voids. Skull and upper cervical spine:  Normal marrow signal. Sinuses/Orbits: Bilateral cataract resection. Other: Intermittently motion degraded, which could obscure subtle findings. IMPRESSION: Unremarkable appearance of the brain.  No explanation for symptoms. Electronically Signed   By: Monte Fantasia M.D.   On: 07/15/2016 15:55   Dg Abd 2 Views Result Date: 07/15/2016 CLINICAL DATA:  RIGHT sided upper abdominal pain, COPD, hypertension, diabetes EXAM: ABDOMEN - 2 VIEW COMPARISON:  05/06/2015 FINDINGS: Lung bases clear. Small hiatal hernia.  Nonobstructive bowel gas pattern. No bowel dilatation, bowel wall thickening, or free air. Prior L3-L4 fusion. Degenerative disc and facet disease changes lumbar spine. No urinary tract calcification or acute osseous findings. IMPRESSION: No acute abnormalities. Hiatal hernia. Electronically Signed   By: Lavonia Dana M.D.   On: 07/15/2016 17:12    1720:  Pt continues to "feel better." No change in neuro assessment. MRI without acute stroke; still concerned for TIA. Dx and testing d/w pt and family.  Questions answered.  Verb understanding, agreeable to admit.  T/C to [facility] Dr. Nehemiah Settle, case discussed, including:  HPI, pertinent PM/SHx, VS/PE, dx testing, ED course and treatment:  Agreeable to admit.   Final Clinical Impressions(s) / ED Diagnoses   Final diagnoses:  Abdominal pain    New Prescriptions New Prescriptions   No medications on file     Francine Graven, DO 07/18/16 1536

## 2016-07-15 NOTE — H&P (Signed)
History and Physical  Eduardo [name] [id number] DOB: [birth year] DOA: 07/15/2016  Referring physician: Dr Thurnell Garbe, ED physician PCP: Eduardo Maize, MD  Outpatient Specialists:   Patient Coming From: home  Chief Complaint: Expressive aphasia, low blood sugar  HPI: Eduardo [name] is a 71 y.o. male with a history of diabetes, CAD on Plavix, history of CABG, upper tension, COPD, carotid artery disease. Patient was driving in car to PCP appointment and was trying to give voice commands to his phone. Even though he knew what he wanted to say, the worse of the patient said were completely wrong and different. The patient knew the words were wrong. Physically he felt fine. He continued to his doctor's appointment - he went to the window and was able to comprehend what the registrar was saying and felt weak. They checked his blood sugar noted that it was 68 and gave him a half Sprite and brought his blood sugar up to 120. The patient felt somewhat better although not completely. Patient was sent here for evaluation. He states that these symptoms do not feel like when he normally has lower blood sugar.  Emergency Department Course: MRI normal. No symptoms in the emergency department. No focal neurological deficits observed.  Review of Systems:   Pt denies any fevers, chills, nausea, vomiting, diarrhea, constipation, abdominal pain, shortness of breath, dyspnea on exertion, orthopnea, cough, wheezing, palpitations, headache, vision changes, lightheadedness, dizziness, melena, rectal bleeding.  Review of systems are otherwise negative  Past Medical History:  Diagnosis Date  . Bilateral carotid artery disease ([place]) 04/28/2015   1-39 percent bilateral stenosis noted on Doppler   . CAD (coronary artery disease)   . Cervical disc disease   . Chemical exposure    agent orange   . COPD (chronic obstructive pulmonary disease) ([place])   . Diabetes mellitus without complication ([place])   .  Diverticulitis   . Hypertension   . Lumbar disc disease   . Obesity (BMI 30-39.9)   . PTSD (post-traumatic stress disorder)   . Sleep apnea    Past Surgical History:  Procedure Laterality Date  . CARDIAC CATHETERIZATION    . CARDIAC CATHETERIZATION  03/18/14   difficult to determine culprit vesel  . CARDIAC CATHETERIZATION N/A 04/29/2015   Procedure: Left Heart Cath and Coronary Angiography;  Surgeon: [name], MD;  Location: [facility];  Service: Cardiovascular;  Laterality: N/A;  . CERVICAL SPINE SURGERY    . CHOLECYSTECTOMY    . CORONARY ARTERY BYPASS GRAFT N/A 05/04/2015   Procedure: CORONARY ARTERY BYPASS GRAFTING (CABG)  x four,  using left internal mammary artery, and bilateral thigh greater saphenous veins;  Surgeon: [name], MD;  Location: [facility];  Service: Open Heart Surgery;  Laterality: N/A;  . EYE SURGERY Right    cataract  . I&D EXTREMITY Left 05/30/2013   Procedure: IRRIGATION AND DEBRIDEMENT Left Hand and Foreign body removal;  Surgeon: [name], MD;  Location: [facility];  Service: Orthopedics;  Laterality: Left;  . LEFT HEART CATHETERIZATION WITH CORONARY ANGIOGRAM N/A 03/18/2014   Procedure: LEFT HEART CATHETERIZATION WITH CORONARY ANGIOGRAM;  Surgeon: [name], MD;  Location: [facility];  Service: Cardiovascular;  Laterality: N/A;  . LUMBAR LAMINECTOMY    . PARTIAL COLECTOMY     For diverticulitis  . PTCA     unsuccesful  . REPLACEMENT TOTAL KNEE     right   . SHOULDER ARTHROSCOPY    . SHOULDER SURGERY  Bilateral   . TEE WITHOUT CARDIOVERSION N/A 05/04/2015   Procedure: TRANSESOPHAGEAL ECHOCARDIOGRAM (TEE);  Surgeon: [name], MD;  Location: [place];  Service: Open Heart Surgery;  Laterality: N/A;   Social History:  reports that he quit smoking about 29 years ago. His smoking use included Cigarettes. He started smoking about 55 years ago. He has a 22.50 pack-year smoking history. He has never used smokeless tobacco. He  reports that he does not drink alcohol or use drugs. Patient lives at [place]  . [place] Other (See Comments)    Body aches    Family History  Problem Relation Age of Onset  . Diabetes Father   . Heart disease Father   . Hypertension Father   . Diabetes Paternal Grandmother   . Heart disease Paternal Grandmother   . Hypertension Paternal Grandmother   . Diabetes Paternal Uncle   . Heart disease Paternal Uncle   . Hypertension Paternal Uncle       Prior to Admission medications   Medication Sig Start Date End Date Taking? Authorizing Provider  albuterol (PROVENTIL HFA;VENTOLIN HFA) 108 (90 BASE) MCG/ACT inhaler Inhale 1 puff into the lungs every 6 (six) hours as needed for wheezing or shortness of breath.    Yes Historical Provider, MD  aspirin EC 81 MG EC tablet Take 1 tablet (81 mg total) by mouth daily. 05/10/15  Yes Wayne E Gold, PA-C  benazepril (LOTENSIN) 40 MG tablet Take 1 tablet (40 mg total) by mouth daily. 07/05/16  Yes Herminio Commons, MD  carvedilol (COREG) 12.5 MG tablet Take 1 tablet (12.5 mg total) by mouth 2 (two) times daily. 07/14/16 10/12/16 Yes Herminio Commons, MD  Cholecalciferol (VITAMIN D PO) Take 1 capsule by mouth daily.   Yes Historical Provider, MD  clopidogrel (PLAVIX) 75 MG tablet TAKE 1 TABLET DAILY 11/16/15  Yes Herminio Commons, MD  DULoxetine (CYMBALTA) 20 MG capsule Take 20 mg by mouth 2 (two) times daily.    Yes Historical Provider, MD  ezetimibe (ZETIA) 10 MG tablet Take 10 mg by mouth daily.   Yes Historical Provider, MD  gemfibrozil (LOPID) 600 MG tablet Take 600 mg by mouth 2 (two) times daily before a meal.   Yes Historical Provider, MD  levothyroxine (SYNTHROID, LEVOTHROID) 25 MCG tablet Take 1 tablet (25 mcg total) by mouth daily before breakfast. 05/10/15  Yes Wayne E Gold, PA-C  metFORMIN (GLUCOPHAGE) 1000 MG tablet Take 1,000 mg by mouth 2 (two) times daily with a meal.   Yes Historical Provider, MD    fenofibrate micronized (LOFIBRA) 134 MG capsule TAKE 1 CAPSULE DAILY BEFORE BREAKFAST Patient not taking: Reported on 07/15/2016 07/04/16   Eduardo Maize, MD  nitroGLYCERIN (NITROSTAT) 0.4 MG SL tablet Place 0.4 mg under the tongue every 5 (five) minutes as needed for chest pain.    Historical Provider, MD    Physical Exam: BP 163/81 (BP Location: Left Arm)   Pulse (!) 59   Temp 97.9 F (36.6 C) (Oral)   Resp 17   Ht 5\' 8"  (1.727 m)   Wt 85.3 kg (188 lb)   SpO2 96%   BMI 28.59 kg/m   General: Elderly Caucasian male. Awake and alert and oriented x3. No acute cardiopulmonary distress.  HEENT: Normocephalic atraumatic.  Right and left ears normal in appearance.  Pupils equal, round, reactive to light. Extraocular muscles are intact. Sclerae anicteric and noninjected.  Moist mucosal membranes. No mucosal lesions.  Neck:  Neck supple without lymphadenopathy. No carotid bruits. No masses palpated.  Cardiovascular: Regular rate with normal S1-S2 sounds. No murmurs, rubs, gallops auscultated. No JVD.  Respiratory: Good respiratory effort with no wheezes, rales, rhonchi. Lungs clear to auscultation bilaterally.  No accessory muscle use. Abdomen: Soft, nontender, nondistended. Active bowel sounds. No masses or hepatosplenomegaly  Skin: No rashes, lesions, or ulcerations.  Dry, warm to touch. 2+ dorsalis pedis and radial pulses. Musculoskeletal: No calf or leg pain. All major joints not erythematous nontender.  No upper or lower joint deformation.  Good ROM.  No contractures  Psychiatric: Intact judgment and insight. Pleasant and cooperative. Neurologic: No focal neurological deficits. Strength is 5/5 and symmetric in upper and lower extremities.  Coordination intact Cranial nerves II through XII are grossly intact.           Labs on Admission: I have personally reviewed following labs and imaging studies  CBC:  Recent Labs Lab 07/15/16 1325  WBC 5.9  NEUTROABS 3.5  HGB 14.9  HCT 43.1   MCV 90.0  PLT 0000000   Basic Metabolic Panel:  Recent Labs Lab 07/15/16 1325  NA 140  K 4.4  CL 106  CO2 26  GLUCOSE 96  BUN 21*  CREATININE 0.97  CALCIUM 9.1   GFR: Estimated Creatinine Clearance: 74.3 mL/min (by C-G formula based on SCr of 0.97 mg/dL). Liver Function Tests:  Recent Labs Lab 07/15/16 1325  AST 24  ALT 26  ALKPHOS 40  BILITOT 0.6  PROT 6.8  ALBUMIN 4.0    Recent Labs Lab 07/15/16 1325  LIPASE 42   No results for input(s): AMMONIA in the last 168 hours. Coagulation Profile: No results for input(s): INR, PROTIME in the last 168 hours. Cardiac Enzymes:  Recent Labs Lab 07/15/16 1325  TROPONINI <0.03   BNP (last 3 results) No results for input(s): PROBNP in the last 8760 hours. HbA1C: No results for input(s): HGBA1C in the last 72 hours. CBG:  Recent Labs Lab 07/15/16 1318 07/15/16 1420  GLUCAP 93 97   Lipid Profile: No results for input(s): CHOL, HDL, LDLCALC, TRIG, CHOLHDL, LDLDIRECT in the last 72 hours. Thyroid Function Tests: No results for input(s): TSH, T4TOTAL, FREET4, T3FREE, THYROIDAB in the last 72 hours. Anemia Panel: No results for input(s): VITAMINB12, FOLATE, FERRITIN, TIBC, IRON, RETICCTPCT in the last 72 hours. Urine analysis:    Component Value Date/Time   COLORURINE YELLOW 07/15/2016 1711   APPEARANCEUR HAZY (A) 07/15/2016 1711   LABSPEC 1.019 07/15/2016 1711   PHURINE 6.0 07/15/2016 1711   GLUCOSEU NEGATIVE 07/15/2016 1711   HGBUR NEGATIVE 07/15/2016 1711   BILIRUBINUR NEGATIVE 07/15/2016 1711   KETONESUR NEGATIVE 07/15/2016 1711   PROTEINUR NEGATIVE 07/15/2016 1711   NITRITE NEGATIVE 07/15/2016 1711   LEUKOCYTESUR NEGATIVE 07/15/2016 1711   Sepsis Labs: @LABRCNTIP (procalcitonin:4,lacticidven:4) )No results found for this or any previous visit (from the past 240 hour(s)).   Radiological Exams on Admission: Dg Chest 2 View  Result Date: 07/15/2016 CLINICAL DATA:  AMS, Seen at [facility] for confusion ,  PATIENTS FAMILY STATES " PATIENTS BLOOD SUGAR WAS REALLY LOW THIS MORNING"HISTORY OF COPD, DM, HTN EXAM: CHEST  2 VIEW COMPARISON:  09/26/2015 FINDINGS: There changes from previous CABG surgery. The cardiac silhouette is normal in size and configuration. Normal mediastinal and hilar contours. Clear lungs.  No pleural effusion.  No pneumothorax. Skeletal structures are intact. IMPRESSION: No acute cardiopulmonary disease. Electronically Signed   By: Lajean Manes M.D.   On: 07/15/2016 15:39  Mr Brain Wo Contrast (neuro Protocol)  Result Date: 07/15/2016 CLINICAL DATA:  Altered mental status. EXAM: MRI HEAD WITHOUT CONTRAST TECHNIQUE: Multiplanar, multiecho pulse sequences of the brain and surrounding structures were obtained without intravenous contrast. COMPARISON:  None. FINDINGS: Brain: No acute infarction, hemorrhage, hydrocephalus, extra-axial collection or mass lesion. No unexpected volume loss or white matter signal. Vascular: Normal flow voids. Skull and upper cervical spine: Normal marrow signal. Sinuses/Orbits: Bilateral cataract resection. Other: Intermittently motion degraded, which could obscure subtle findings. IMPRESSION: Unremarkable appearance of the brain.  No explanation for symptoms. Electronically Signed   By: Monte Fantasia M.D.   On: 07/15/2016 15:55   US Abdomen Complete  Result Date: 07/15/2016 CLINICAL DATA:  Chronic mid upper abdominal pain. Initial encounter. EXAM: ABDOMEN ULTRASOUND COMPLETE COMPARISON:  None. FINDINGS: Gallbladder: No gallstones or wall thickening visualized. Mild echogenic sludge is noted within the gallbladder. No sonographic Murphy sign noted by sonographer. Common bile duct: Diameter: 0.4 cm, within normal limits in caliber. Liver: No focal lesion identified. Diffusely increased parenchymal echogenicity and coarsened echotexture, compatible with fatty infiltration. This limits evaluation of the liver. Difficult to fully characterize due to overlying bowel  gas. IVC: No abnormality visualized. Pancreas: Visualized portion unremarkable. Spleen: Size and appearance within normal limits. Right Kidney: Length: 11.3 cm. Borderline increased parenchymal echogenicity is noted. No mass or hydronephrosis visualized. Left Kidney: Length: 10.8 cm. Borderline increased parenchymal echogenicity is noted. No mass or hydronephrosis visualized. Abdominal aorta: No aneurysm visualized. Only partially characterized due to overlying bowel gas. Other findings: None. IMPRESSION: 1. No acute abnormality seen within the abdomen. Evaluation suboptimal due to overlying bowel gas and the patient's habitus. 2. Diffuse fatty infiltration within the liver. 3. Mild echogenic sludge within the gallbladder. Gallbladder otherwise unremarkable in appearance. Electronically Signed   By: Garald Balding M.D.   On: 07/15/2016 20:42   Dg Abd 2 Views  Result Date: 07/15/2016 CLINICAL DATA:  RIGHT sided upper abdominal pain, COPD, hypertension, diabetes EXAM: ABDOMEN - 2 VIEW COMPARISON:  05/06/2015 FINDINGS: Lung bases clear. Small hiatal hernia. Nonobstructive bowel gas pattern. No bowel dilatation, bowel wall thickening, or free air. Prior L3-L4 fusion. Degenerative disc and facet disease changes lumbar spine. No urinary tract calcification or acute osseous findings. IMPRESSION: No acute abnormalities. Hiatal hernia. Electronically Signed   By: Lavonia Dana M.D.   On: 07/15/2016 17:12    EKG: Independently reviewed. Sinus rhythm with first-degree AV block  Assessment/Plan: Active Problems:   CAD (coronary artery disease)   HTN (hypertension)   Type 2 diabetes mellitus without complication, without long-term current use of insulin ([place])   Expressive aphasia    This patient was discussed with the ED physician, including pertinent vitals, physical exam findings, labs, and imaging.  We also discussed care given by the ED provider.  #1 expressive aphasia  Observation on telemetry  Echo,  carotid Dopplers  Hemoglobin A1c  Lipid panel  PT/OT/speech therapy consult #2 type 2 diabetes  Continue home medication #3 hypertension  Currently a little elevated, had recent dose increase, will continue currently #4 coronary artery disease  Continue Plavix, aspirin  DVT prophylaxis: Lovenox Consultants: None Code Status: Full code Family Communication: None  Disposition Plan: Patient should be able to return home following workup   Truett Mainland, DO [facility] Pager [phone]  If 7PM-7AM, please contact night-coverage [email or web address] Password [email or web address]

## 2016-07-15 NOTE — ED Triage Notes (Signed)
Seen at [facility] for confusion - his CBG was 35- also did an EKG and was told that there was difference in last EKG and this one per family- Seen at [facility] by Dr Evette Doffing and sent herer

## 2016-07-15 NOTE — Progress Notes (Signed)
Subjective:   Patient ID: Eduardo [name], male    DOB: [birth year], 71 y.o.   MRN: [id number] CC: follow up  HPI: Eduardo [name] is a 70 y.o. male presenting for No chief complaint on file.  Felt mixed up this morning after leaving home to come to office appt Says is the first time he has ever not felt in control Tried to ask phone to call [employer] in the car, was able to get out the words though he could think them Called his wife next, to say he didn't feel right, was on his way to his scheduled appt with [place] this morning though In the clinic lobby says he felt confused when handed the paperwork to fill out, knocked over the cup of pens at the desk, bent over to halp then felt lightheaded  Able to describe medication change yesterday starting yesterday at cardiology appt Has had consistently elevated BPs for the last month 1 week ago benazepril was increased 20mg  to 40mg  Today was first day he took new coreg 12.5mg  dose (BID), on 6.25 BID before  Two days ago felt very shaky when eating breakfast, BGL at home was 73 When he got BGL up to over 100 he felt better Did not have any confusion like he feels like he has now Has a hard time describing why he feels off, says he feels not in control, has never felt like this before Is worried something is wrong he doesn't know about  This morning BGL was 110 Ate slightly less for breakfast than usual  Seen by PCP at [facility] yesterday Was set up for CT scan abd, GI referral for early satiety and abd pain abd pain off and on, getting worse for several weeks at least  Relevant past medical, surgical, family and social history reviewed. Allergies and medications reviewed and updated. History  Smoking Status  . Former Smoker  . Packs/day: 0.75  . Years: 30.00  . Types: Cigarettes  . Start date: 01/02/1961  . Quit date: 05/31/1987  Smokeless Tobacco  . Never Used   ROS: Per HPI   Objective:    BP (!) 155/89   Pulse 63     Wt Readings from Last 3 Encounters:  05/19/16 191 lb 6.4 oz (86.8 kg)  05/12/16 192 lb 3.2 oz (87.2 kg)  03/16/16 189 lb 3.2 oz (85.8 kg)    Gen: NAD, alert, cooperative with exam, NCAT EYES: EOMI, no conjunctival injection, or no icterus ENT:  OP without erythema LYMPH: no cervical LAD CV: NRRR, normal S1/S2, no murmur, distal pulses 2+ b/l Resp: CTABL, no wheezes, normal WOB Abd: +BS, soft, NTND. no guarding or organomegaly Ext: No edema, warm Neuro: Alert and oriented to person, place, time, event, strength equal b/l UE and LE, sensation intact b/l UE, slight decrease R naso-labial fold compared with L at rest, equal smile, eye closure, eyebrow raise  Assessment & Plan:  Diagnoses and all orders for this visit:  Confusion Initial BGL 68, up to 120 after half a sprite. Still with confusion. Wife called, came to pick him up from clinic He continues to say his thinking is not normal, able to express himself, says he doesn't know where he is or what is going on though he is able to answer orientation questions appropriately Wife says he does not seem like his normal self No sensation changes, balance is off, does have slight slackening of R nasolabial fold, but symmetric face otherwise EKG with new qwvaes  inferior leads, but otherwise similar to prior Did have 12.5mg  of coreg this morning not 6.25, not likely to cause current symptoms Concern for TIA, pt and wife going to go to ED -     Glucose Hemocue Waived -     Glucose Hemocue Waived -     EKG 12-Lead  Essential hypertension -     EKG 12-Lead   Follow up plan: 1 week Assunta Found, MD [place]

## 2016-07-16 ENCOUNTER — Ambulatory Visit (HOSPITAL_COMMUNITY): Admission: RE | Admit: 2016-07-16 | Payer: Medicare Other | Source: Ambulatory Visit

## 2016-07-16 ENCOUNTER — Observation Stay (HOSPITAL_COMMUNITY): Payer: Medicare Other

## 2016-07-16 DIAGNOSIS — E162 Hypoglycemia, unspecified: Secondary | ICD-10-CM

## 2016-07-16 DIAGNOSIS — E119 Type 2 diabetes mellitus without complications: Secondary | ICD-10-CM

## 2016-07-16 DIAGNOSIS — R4701 Aphasia: Secondary | ICD-10-CM | POA: Diagnosis not present

## 2016-07-16 DIAGNOSIS — G459 Transient cerebral ischemic attack, unspecified: Secondary | ICD-10-CM | POA: Diagnosis not present

## 2016-07-16 LAB — LIPID PANEL
Cholesterol: 130 mg/dL (ref 0–200)
HDL: 25 mg/dL — ABNORMAL LOW (ref >40)
LDL CALC: 43 mg/dL (ref 0–99)
Total CHOL/HDL Ratio: 5.2 RATIO
Triglycerides: 308 mg/dL — ABNORMAL HIGH (ref <150)
VLDL: 62 mg/dL — AB (ref 0–40)

## 2016-07-16 NOTE — Discharge Summary (Signed)
Discharge Summary  Eduardo [name] [id number] DOB: [birth year]  PCP: [name], MD  Admit date: 07/15/2016 Discharge date: 07/16/2016  Time spent: <86mins  Recommendations for Outpatient Follow-up:  1. F/u with PMD within a week  for hospital discharge follow up, repeat cbc/bmp at follow up  Discharge Diagnoses:  Active Hospital Problems   Diagnosis Date Noted  . Expressive aphasia 07/15/2016  . Type 2 diabetes mellitus without complication, without long-term current use of insulin ([place]) 09/21/2013  . CAD (coronary artery disease) 04/16/2013  . HTN (hypertension) 04/16/2013    Resolved Hospital Problems   Diagnosis Date Noted Date Resolved  No resolved problems to display.    Discharge Condition: stable  Diet recommendation: heart healthy/carb modified  Filed Weights   07/15/16 1312 07/15/16 2200  Weight: 85.3 kg (188 lb) 86.9 kg (191 lb 9.3 oz)    History of present illness:  Patient Coming From: home  Chief Complaint: Expressive aphasia, low blood sugar  HPI: Eduardo [name] is a 71 y.o. male with a history of diabetes, CAD on Plavix, history of CABG, upper tension, COPD, carotid artery disease. Patient was driving in car to PCP appointment and was trying to give voice commands to his phone. Even though he knew what he wanted to say, the worse of the patient said were completely wrong and different. The patient knew the words were wrong. Physically he felt fine. He continued to his doctor's appointment - he went to the window and was able to comprehend what the registrar was saying and felt weak. They checked his blood sugar noted that it was 68 and gave him a half Sprite and brought his blood sugar up to 120. The patient felt somewhat better although not completely. Patient was sent here for evaluation. He states that these symptoms do not feel like when he normally has lower blood sugar.  Emergency Department Course: MRI normal. No symptoms in  the emergency department. No focal neurological deficits observed.   Hospital Course:  Active Problems:   CAD (coronary artery disease)   HTN (hypertension)   Type 2 diabetes mellitus without complication, without long-term current use of insulin ([place])   Expressive aphasia  Expressive aphasia/confusion Likely due to hypoglycemia episodes vs TIA.  MRI brain negative, basic labs unremarkable, ua unremarkable, ldl 43, a1c pending at discharge. Already on asa and plavix. Symptom resolved.  noninsulin dependent DM2, with hypoglycemia episode, resolved, patient is advised to check his blood sugar more frequently, follow up with pmd to diabetes meds adjustment.  HTN; continue home meds coreg and lotensin.  High triglyceride: continue home meds zetia and gembirozil.   CAD s/p CABG: no chest pain, continue home meds, asa/palvix  Hypothyroidism: continue synthroid  PTSD continue cymbalta  Right upper quadrant abdominal pain: unremarkable kub, unremarkable abdominal ultrasounds, lft wnl Symptom resolved.  Procedures:  none  Consultations:  none  Discharge Exam: BP (!) 142/80 (BP Location: Right Arm)   Pulse 66   Temp 97.5 F (36.4 C) (Oral)   Resp 16   Ht 5\' 8"  (1.727 m)   Wt 86.9 kg (191 lb 9.3 oz)   SpO2 97%   BMI 29.13 kg/m   General: NAD Cardiovascular: RRR Respiratory: CTABL  Discharge Instructions You were cared for by a hospitalist during your hospital stay. If you have any questions about your discharge medications or the care you received while you were in the hospital after you are discharged, you can call the unit and asked to speak  with the hospitalist on call if the hospitalist that took care of you is not available. Once you are discharged, your primary care physician will handle any further medical issues. Please note that NO REFILLS for any discharge medications will be authorized once you are discharged, as it is imperative that you return to your  primary care physician (or establish a relationship with a primary care physician if you do not have one) for your aftercare needs so that they can reassess your need for medications and monitor your lab values.  Discharge Instructions    Diet - low sodium heart healthy    Complete by:  As directed    Carb modified   Increase activity slowly    Complete by:  As directed      Allergies as of 07/16/2016      Reactions   Statins Other (See Comments)   Body aches      Medication List    STOP taking these medications   fenofibrate micronized 134 MG capsule Commonly known as:  LOFIBRA     TAKE these medications   albuterol 108 (90 Base) MCG/ACT inhaler Commonly known as:  PROVENTIL HFA;VENTOLIN HFA Inhale 1 puff into the lungs every 6 (six) hours as needed for wheezing or shortness of breath.   aspirin 81 MG EC tablet Take 1 tablet (81 mg total) by mouth daily.   benazepril 40 MG tablet Commonly known as:  LOTENSIN Take 1 tablet (40 mg total) by mouth daily.   carvedilol 12.5 MG tablet Commonly known as:  COREG Take 1 tablet (12.5 mg total) by mouth 2 (two) times daily.   clopidogrel 75 MG tablet Commonly known as:  PLAVIX TAKE 1 TABLET DAILY   DULoxetine 20 MG capsule Commonly known as:  CYMBALTA Take 20 mg by mouth 2 (two) times daily.   ezetimibe 10 MG tablet Commonly known as:  ZETIA Take 10 mg by mouth daily.   gemfibrozil 600 MG tablet Commonly known as:  LOPID Take 600 mg by mouth 2 (two) times daily before a meal.   levothyroxine 25 MCG tablet Commonly known as:  SYNTHROID, LEVOTHROID Take 1 tablet (25 mcg total) by mouth daily before breakfast.   metFORMIN 1000 MG tablet Commonly known as:  GLUCOPHAGE Take 1,000 mg by mouth 2 (two) times daily with a meal.   nitroGLYCERIN 0.4 MG SL tablet Commonly known as:  NITROSTAT Place 0.4 mg under the tongue every 5 (five) minutes as needed for chest pain.   VITAMIN D PO Take 1 capsule by mouth daily.        Allergies  Allergen Reactions  . Statins Other (See Comments)    Body aches   Follow-up Information    [name], MD Follow up in 1 week(s).   Specialty:  Pediatrics Why:  hospital discharge follow up , please check your blood sugar and blood pressure at home, bring in record for your doctor to review.   Contact information: [place]  [id number] [phone]            The results of significant diagnostics from this hospitalization (including imaging, microbiology, ancillary and laboratory) are listed below for reference.    Significant Diagnostic Studies: Dg Chest 2 View  Result Date: 07/15/2016 CLINICAL DATA:  AMS, Seen at [facility] for confusion , PATIENTS FAMILY STATES " PATIENTS BLOOD SUGAR WAS REALLY LOW THIS MORNING"HISTORY OF COPD, DM, HTN EXAM: CHEST  2 VIEW COMPARISON:  09/26/2015 FINDINGS: There changes  from previous CABG surgery. The cardiac silhouette is normal in size and configuration. Normal mediastinal and hilar contours. Clear lungs.  No pleural effusion.  No pneumothorax. Skeletal structures are intact. IMPRESSION: No acute cardiopulmonary disease. Electronically Signed   By: Lajean Manes M.D.   On: 07/15/2016 15:39   Mr Brain Wo Contrast (neuro Protocol)  Result Date: 07/15/2016 CLINICAL DATA:  Altered mental status. EXAM: MRI HEAD WITHOUT CONTRAST TECHNIQUE: Multiplanar, multiecho pulse sequences of the brain and surrounding structures were obtained without intravenous contrast. COMPARISON:  None. FINDINGS: Brain: No acute infarction, hemorrhage, hydrocephalus, extra-axial collection or mass lesion. No unexpected volume loss or white matter signal. Vascular: Normal flow voids. Skull and upper cervical spine: Normal marrow signal. Sinuses/Orbits: Bilateral cataract resection. Other: Intermittently motion degraded, which could obscure subtle findings. IMPRESSION: Unremarkable appearance of the brain.  No explanation for symptoms. Electronically  Signed   By: Monte Fantasia M.D.   On: 07/15/2016 15:55   US Abdomen Complete  Result Date: 07/15/2016 CLINICAL DATA:  Chronic mid upper abdominal pain. Initial encounter. EXAM: ABDOMEN ULTRASOUND COMPLETE COMPARISON:  None. FINDINGS: Gallbladder: No gallstones or wall thickening visualized. Mild echogenic sludge is noted within the gallbladder. No sonographic Murphy sign noted by sonographer. Common bile duct: Diameter: 0.4 cm, within normal limits in caliber. Liver: No focal lesion identified. Diffusely increased parenchymal echogenicity and coarsened echotexture, compatible with fatty infiltration. This limits evaluation of the liver. Difficult to fully characterize due to overlying bowel gas. IVC: No abnormality visualized. Pancreas: Visualized portion unremarkable. Spleen: Size and appearance within normal limits. Right Kidney: Length: 11.3 cm. Borderline increased parenchymal echogenicity is noted. No mass or hydronephrosis visualized. Left Kidney: Length: 10.8 cm. Borderline increased parenchymal echogenicity is noted. No mass or hydronephrosis visualized. Abdominal aorta: No aneurysm visualized. Only partially characterized due to overlying bowel gas. Other findings: None. IMPRESSION: 1. No acute abnormality seen within the abdomen. Evaluation suboptimal due to overlying bowel gas and the patient's habitus. 2. Diffuse fatty infiltration within the liver. 3. Mild echogenic sludge within the gallbladder. Gallbladder otherwise unremarkable in appearance. Electronically Signed   By: Garald Balding M.D.   On: 07/15/2016 20:42   Dg Abd 2 Views  Result Date: 07/15/2016 CLINICAL DATA:  RIGHT sided upper abdominal pain, COPD, hypertension, diabetes EXAM: ABDOMEN - 2 VIEW COMPARISON:  05/06/2015 FINDINGS: Lung bases clear. Small hiatal hernia. Nonobstructive bowel gas pattern. No bowel dilatation, bowel wall thickening, or free air. Prior L3-L4 fusion. Degenerative disc and facet disease changes lumbar spine.  No urinary tract calcification or acute osseous findings. IMPRESSION: No acute abnormalities. Hiatal hernia. Electronically Signed   By: Lavonia Dana M.D.   On: 07/15/2016 17:12    Microbiology: No results found for this or any previous visit (from the past 240 hour(s)).   Labs: Basic Metabolic Panel:  Recent Labs Lab 07/15/16 1325  NA 140  K 4.4  CL 106  CO2 26  GLUCOSE 96  BUN 21*  CREATININE 0.97  CALCIUM 9.1   Liver Function Tests:  Recent Labs Lab 07/15/16 1325  AST 24  ALT 26  ALKPHOS 40  BILITOT 0.6  PROT 6.8  ALBUMIN 4.0    Recent Labs Lab 07/15/16 1325  LIPASE 42   No results for input(s): AMMONIA in the last 168 hours. CBC:  Recent Labs Lab 07/15/16 1325  WBC 5.9  NEUTROABS 3.5  HGB 14.9  HCT 43.1  MCV 90.0  PLT 204   Cardiac Enzymes:  Recent Labs Lab 07/15/16 1325  TROPONINI <0.03   BNP: BNP (last 3 results) No results for input(s): BNP in the last 8760 hours.  ProBNP (last 3 results) No results for input(s): PROBNP in the last 8760 hours.  CBG:  Recent Labs Lab 07/15/16 1318 07/15/16 1420  GLUCAP 93 97       Signed:  Annibelle Brazie MD, PhD  [facility] 07/16/2016, 1:53 PM

## 2016-07-16 NOTE — Progress Notes (Signed)
Pt's IV catheter removed and intact. Pt's IV site clean dry and intact. Discharge instructions including medications and follow up appointments were reviewed and discussed with patient. All questions were answered and no further questions at this time. Pt verbalized understanding of discharge instructions. Pt in stable condition and in no acute distress at this time. Pt escorted by RN.

## 2016-07-16 NOTE — Progress Notes (Signed)
Pt has refused all oral Medications at this time. Pt states that his last hospitalization he received a very large bill for medications that was administered to him and he did not want any oral medications.

## 2016-07-17 LAB — HEMOGLOBIN A1C
Hgb A1c MFr Bld: 6.7 % — ABNORMAL HIGH (ref 4.8–5.6)
MEAN PLASMA GLUCOSE: 146 mg/dL

## 2016-07-18 ENCOUNTER — Telehealth: Payer: Self-pay | Admitting: *Deleted

## 2016-07-18 ENCOUNTER — Other Ambulatory Visit: Payer: Self-pay | Admitting: *Deleted

## 2016-07-18 NOTE — Telephone Encounter (Signed)
Call Completed and Appointment Scheduled: Yes, Date: 07/22/2016 with [place] INFORMATION Date of Discharge:07/16/2016  Discharge Facility: [facility]  Principal Discharge Diagnosis: Expressive aphasia  Patient and/or caregiver is knowledgeable of his/her condition(s) and treatment: Yes  MEDICATION RECONCILIATION Current medication list reviewed with patient:Yes  Outpatient Encounter Prescriptions as of 07/18/2016  Medication Sig  . albuterol (PROVENTIL HFA;VENTOLIN HFA) 108 (90 BASE) MCG/ACT inhaler Inhale 1 puff into the lungs every 6 (six) hours as needed for wheezing or shortness of breath.   [name] aspirin EC 81 MG EC tablet Take 1 tablet (81 mg total) by mouth daily.  . benazepril (LOTENSIN) 40 MG tablet Take 1 tablet (40 mg total) by mouth daily.  . carvedilol (COREG) 12.5 MG tablet Take 1 tablet (12.5 mg total) by mouth 2 (two) times daily.  . Cholecalciferol (VITAMIN D PO) Take 1 capsule by mouth daily.  . clopidogrel (PLAVIX) 75 MG tablet TAKE 1 TABLET DAILY  . DULoxetine (CYMBALTA) 20 MG capsule Take 20 mg by mouth 2 (two) times daily.   [name] ezetimibe (ZETIA) 10 MG tablet Take 10 mg by mouth daily.  [name] gemfibrozil (LOPID) 600 MG tablet Take 600 mg by mouth 2 (two) times daily before a meal.  . levothyroxine (SYNTHROID, LEVOTHROID) 25 MCG tablet Take 1 tablet (25 mcg total) by mouth daily before breakfast.  . metFORMIN (GLUCOPHAGE) 1000 MG tablet Take 1,000 mg by mouth 2 (two) times daily with a meal.  . nitroGLYCERIN (NITROSTAT) 0.4 MG SL tablet Place 0.4 mg under the tongue every 5 (five) minutes as needed for chest pain.   No facility-administered encounter medications on file as of 07/18/2016.     Discharge Medications reviewed and reconciled with current medications.yes  Patient is able to obtain needed medications:Yes  ACTIVITIES OF DAILY LIVING  Is the patient able to perform his/her own ADLs: Yes.    Patient is receiving home health services:  No.  PATIENT EDUCATION Questions/Concerns Discussed: none

## 2016-07-18 NOTE — Telephone Encounter (Signed)
Call Completed and Appointment Scheduled: Yes, Date: 07/22/2016 with Dr Emiliano Dyer INFORMATION Date of Discharge:07/16/2016  Discharge Facility: [facility]  Principal Discharge Diagnosis: expressive aphasia  Patient and/or caregiver is knowledgeable of his/her condition(s) and treatment: Yes  MEDICATION RECONCILIATION Current medication list reviewed with patient:Yes  Outpatient Encounter Prescriptions as of 07/18/2016  Medication Sig  . albuterol (PROVENTIL HFA;VENTOLIN HFA) 108 (90 BASE) MCG/ACT inhaler Inhale 1 puff into the lungs every 6 (six) hours as needed for wheezing or shortness of breath.   [name] aspirin EC 81 MG EC tablet Take 1 tablet (81 mg total) by mouth daily.  . benazepril (LOTENSIN) 40 MG tablet Take 1 tablet (40 mg total) by mouth daily.  . carvedilol (COREG) 12.5 MG tablet Take 1 tablet (12.5 mg total) by mouth 2 (two) times daily.  . Cholecalciferol (VITAMIN D PO) Take 1 capsule by mouth daily.  . clopidogrel (PLAVIX) 75 MG tablet TAKE 1 TABLET DAILY  . DULoxetine (CYMBALTA) 20 MG capsule Take 20 mg by mouth 2 (two) times daily.   [name] ezetimibe (ZETIA) 10 MG tablet Take 10 mg by mouth daily.  [name] gemfibrozil (LOPID) 600 MG tablet Take 600 mg by mouth 2 (two) times daily before a meal.  . levothyroxine (SYNTHROID, LEVOTHROID) 25 MCG tablet Take 1 tablet (25 mcg total) by mouth daily before breakfast.  . metFORMIN (GLUCOPHAGE) 1000 MG tablet Take 1,000 mg by mouth 2 (two) times daily with a meal.  . nitroGLYCERIN (NITROSTAT) 0.4 MG SL tablet Place 0.4 mg under the tongue every 5 (five) minutes as needed for chest pain.   No facility-administered encounter medications on file as of 07/18/2016.     Discharge Medications reviewed and reconciled with current medications.yes  Patient is able to obtain needed medications:Yes  ACTIVITIES OF DAILY LIVING  Is the patient able to perform his/her own ADLs: Yes  Patient is receiving home health services: No.  PATIENT  EDUCATION Questions/Concerns Discussed: none *

## 2016-07-20 ENCOUNTER — Telehealth: Payer: Self-pay | Admitting: Cardiovascular Disease

## 2016-07-20 DIAGNOSIS — M25511 Pain in right shoulder: Secondary | ICD-10-CM | POA: Diagnosis not present

## 2016-07-20 DIAGNOSIS — G8929 Other chronic pain: Secondary | ICD-10-CM | POA: Diagnosis not present

## 2016-07-20 NOTE — Telephone Encounter (Signed)
Mr. Gane called stating that he needs clearance for his upcoming surgery FINGER ARTHROSCOPY WITH CARPOMETACARPEL [facility]) ARTHROPLASTY scheduled for 08/04/2016. Please call patient # [phone]

## 2016-07-20 NOTE — Telephone Encounter (Signed)
He can proceed.

## 2016-07-20 NOTE — Telephone Encounter (Signed)
Patient notified

## 2016-07-20 NOTE — Telephone Encounter (Signed)
Last seen 12/18/2015.  Due for 1 year this July 2018.  Is only on ASA.   Forward to provider in regards to clearance.

## 2016-07-22 ENCOUNTER — Ambulatory Visit (INDEPENDENT_AMBULATORY_CARE_PROVIDER_SITE_OTHER): Payer: Medicare Other | Admitting: Pediatrics

## 2016-07-22 ENCOUNTER — Encounter: Payer: Self-pay | Admitting: Pediatrics

## 2016-07-22 VITALS — BP 137/88 | HR 65 | Temp 97.5°F | Ht 68.0 in | Wt 194.0 lb

## 2016-07-22 DIAGNOSIS — Z951 Presence of aortocoronary bypass graft: Secondary | ICD-10-CM | POA: Diagnosis not present

## 2016-07-22 DIAGNOSIS — Z01818 Encounter for other preprocedural examination: Secondary | ICD-10-CM

## 2016-07-22 DIAGNOSIS — E119 Type 2 diabetes mellitus without complications: Secondary | ICD-10-CM

## 2016-07-22 DIAGNOSIS — R4701 Aphasia: Secondary | ICD-10-CM | POA: Diagnosis not present

## 2016-07-22 DIAGNOSIS — I10 Essential (primary) hypertension: Secondary | ICD-10-CM

## 2016-07-22 NOTE — Progress Notes (Signed)
Subjective:   Patient ID: [name], male    DOB: Oct 04, 1945, 71 y.o.   MRN: [id number] CC: Transitional Care Management  HPI: MOISHE LARCHER is a 71 y.o. male presenting for Transitional Care Management  Today's visit is for Transitional Care Management.  The patient was discharged from [facility] on 07/16/2016 with a primary diagnosis of expressive aphasia and hypoglycemia.   Contact with the patient and/or caregiver, by a clinical staff member, was made on 2/17 and was documented as a telephone encounter within the EMR.  Through chart review and discussion with the patient I have determined that management of their condition is of moderate complexity.   He was hospitalized for expressive aphasia Symptoms resolved by the time he got to the hospital and blood sugar level improved Was kept over night, MRI normal. ECHO and dopplers planned then canceled after hypoglycemia determined to be cause. He had taken new 12.5mg  dose of carvedilol that morning Has had similar episode of slighlty low (70s) BGL and felt similarly in the past on lower dose of BB. Had carotid dopplers in 2015 with 1-39% stenosis, ECHO in 2016 with nl EF, probably mild hypokinesis of the basal-midinferior and inferoseptal myocardium. CAD S/p CABG, on ASA, plavix, ACE-I, betablocker, statin  Pt had been taking two 75mg  tabs of plavix daily for 3 days prior to hospitalization last week.   Feeling great since being home.   DM2: has been 100s-110s when he has checked at home A1c 6.7 in th ehospital Say sthe last time we decreased his metformin to 500mg  BID his BGLs got too high  Has been outside making garden beds last few days Shoveling mulch, building planting boxes No CP, no SOB, no focal weakness or numbness Thinking has been clear Wife with him today, agrees  BPs at home 140s-160s/ 87-97 when he checks throughout the day Eating a lot of pickled garlic  HR at home [id number]  Has surgery  scheduled for thumb in 2 weeks  Relevant past medical, surgical, family and social history reviewed. Allergies and medications reviewed and updated. History  Smoking Status  . Former Smoker  . Packs/day: 0.75  . Years: 30.00  . Types: Cigarettes  . Start date: 01/02/1961  . Quit date: 05/31/1987  Smokeless Tobacco  . Never Used   ROS: Per HPI   Objective:    BP 137/88   Pulse 65   Temp 97.5 F (36.4 C) (Oral)   Ht 5\' 8"  (1.727 m)   Wt 194 lb (88 kg)   BMI 29.50 kg/m   Wt Readings from Last 3 Encounters:  07/22/16 194 lb (88 kg)  07/15/16 191 lb 9.3 oz (86.9 kg)  05/19/16 191 lb 6.4 oz (86.8 kg)   Gen: NAD, alert, cooperative with exam, NCAT EYES: EOMI, no conjunctival injection, or no icterus ENT:  TMs pearly gray b/l, OP without erythema LYMPH: no cervical LAD CV: NRRR, normal S1/S2, no murmur, distal pulses 2+ b/l Resp: CTABL, no wheezes, normal WOB Ext: No edema, warm Neuro: Alert and oriented, strength equal b/l UE and LE, coordination grossly normal, sensation intact  Assessment & Plan:  [place] was seen today for transitional care management.  Diagnoses and all orders for this visit:  Hypoglycemia  Resolved Not on SU, insulin Cont current dose of BB Cont to follow BGLs at home Have been fine since return home Eat regular meals, no fasting  CAD S/P CABG x 4 No symptoms, stable Cont meds  Type 2 diabetes  mellitus without complication, without long-term current use of insulin ([facility]) Cont metformin 100mg  BID  Essential hypertension Slightly elevated at home 130s/80s today Cont to follow  Pre-op evaluation Capable of 10 METs with shoveling large amounts of mulch over the past week. No CP, SOB H/o CAD BP adequately controlled, DM2 well controlled Hand surgery planned for arthritis in thumb Discussed with pt, reasonable to proceed with surgery.   Follow up plan: Return in about 3 months (around 10/19/2016). Assunta Found, MD [place]

## 2016-07-27 NOTE — H&P (Signed)
Eduardo [name] is an 71 y.o. male.   Chief Complaint: RIGHT THUMB CARPOMETACARPAL END STAGE ARTHRITIS  HPI: THE PATIENT HAS BEEN FOLLOWED IN THE OFFICE FOR RIGHT THUMB CARPOMETACARPAL PAIN.  HE HAD PREVIOUS SURGERY ON THE LEFT SIDE YEARS AGO DUE TO THE SAME TYPE PAIN. HE HAS DONE WELL WITH THE LEFT THUMB ARTHROPLASTY AND WOULD LIKE THE SAME SURGERY ON THE RIGHT SIDE.  THE PATIENT IS HERE TODAY FOR SURGERY.   Past Medical History:  Diagnosis Date  . Bilateral carotid artery disease ([place]) 04/28/2015   1-39 percent bilateral stenosis noted on Doppler   . CAD (coronary artery disease)   . Cervical disc disease   . Chemical exposure    agent orange   . COPD (chronic obstructive pulmonary disease) ([place])   . Diabetes mellitus without complication ([place])   . Diverticulitis   . Hypertension   . Lumbar disc disease   . Obesity (BMI 30-39.9)   . PTSD (post-traumatic stress disorder)   . Sleep apnea     Past Surgical History:  Procedure Laterality Date  . CARDIAC CATHETERIZATION    . CARDIAC CATHETERIZATION  03/18/14   difficult to determine culprit vesel  . CARDIAC CATHETERIZATION N/A 04/29/2015   Procedure: Left Heart Cath and Coronary Angiography;  Surgeon: [name], MD;  Location: [facility];  Service: Cardiovascular;  Laterality: N/A;  . CERVICAL SPINE SURGERY    . CHOLECYSTECTOMY    . CORONARY ARTERY BYPASS GRAFT N/A 05/04/2015   Procedure: CORONARY ARTERY BYPASS GRAFTING (CABG)  x four,  using left internal mammary artery, and bilateral thigh greater saphenous veins;  Surgeon: [name], MD;  Location: [facility];  Service: Open Heart Surgery;  Laterality: N/A;  . EYE SURGERY Right    cataract  . I&D EXTREMITY Left 05/30/2013   Procedure: IRRIGATION AND DEBRIDEMENT Left Hand and Foreign body removal;  Surgeon: [name], MD;  Location: [facility];  Service: Orthopedics;  Laterality: Left;  . LEFT HEART CATHETERIZATION WITH CORONARY ANGIOGRAM N/A  03/18/2014   Procedure: LEFT HEART CATHETERIZATION WITH CORONARY ANGIOGRAM;  Surgeon: [name], MD;  Location: [facility];  Service: Cardiovascular;  Laterality: N/A;  . LUMBAR LAMINECTOMY    . PARTIAL COLECTOMY     For diverticulitis  . PTCA     unsuccesful  . REPLACEMENT TOTAL KNEE     right   . SHOULDER ARTHROSCOPY    . SHOULDER SURGERY Bilateral   . TEE WITHOUT CARDIOVERSION N/A 05/04/2015   Procedure: TRANSESOPHAGEAL ECHOCARDIOGRAM (TEE);  Surgeon: [name], MD;  Location: [facility];  Service: Open Heart Surgery;  Laterality: N/A;    Family History  Problem Relation Age of Onset  . Diabetes Father   . Heart disease Father   . Hypertension Father   . Diabetes Paternal Grandmother   . Heart disease Paternal Grandmother   . Hypertension Paternal Grandmother   . Diabetes Paternal Uncle   . Heart disease Paternal Uncle   . Hypertension Paternal Uncle    Social History:  reports that he quit smoking about 29 years ago. His smoking use included Cigarettes. He started smoking about 55 years ago. He has a 22.50 pack-year smoking history. He has never used smokeless tobacco. He reports that he does not drink alcohol or use drugs.  Allergies:  Allergies  Allergen Reactions  . Statins Other (See Comments)    Body aches    No prescriptions prior to admission.  No results found for this or any previous visit (from the past 48 hour(s)). No results found.  ROS NO RECENT ILLNESSES OR HOSPITALIZATIONS  There were no vitals taken for this visit. Physical Exam  General Appearance:  Alert, cooperative, no distress, appears stated age  Head:  Normocephalic, without obvious abnormality, atraumatic  Eyes:  Pupils equal, conjunctiva/corneas clear,         Throat: Lips, mucosa, and tongue normal; teeth and gums normal  Neck: No visible masses     Lungs:   respirations unlabored  Chest Wall:  No tenderness or deformity  Heart:  Regular rate and rhythm,  Abdomen:    Soft, non-tender,         Extremities: RIGHT THUMB: SKIN INTACT, FINGERS WARM WELL PERFUSED  PAIN WITH AXIAL LOADING OF CMC JOINT GOOD WRIST AND DIGITAL MOTION  Pulses: 2+ and symmetric  Skin: Skin color, texture, turgor normal, no rashes or lesions     Neurologic: Normal    Assessment RIGHT THUMB CARPOMETACARPAL END STAGE ARTHRITIS, BONE ON BONE ARTHRITIS  Plan RIGHT THUMB CARPOMETACARPAL ARTHROPLASTY AND TENDON/GRAFT INTERPOSITION  R/B/A DISCUSSED WITH PT IN OFFICE.  PT VOICED UNDERSTANDING OF PLAN CONSENT SIGNED DAY OF SURGERY PT SEEN AND EXAMINED PRIOR TO OPERATIVE PROCEDURE/DAY OF SURGERY SITE MARKED. QUESTIONS ANSWERED WILL GO HOME FOLLOWING SURGERY  WE ARE PLANNING SURGERY FOR YOUR UPPER EXTREMITY. THE RISKS AND BENEFITS OF SURGERY INCLUDE BUT NOT LIMITED TO BLEEDING INFECTION, DAMAGE TO NEARBY NERVES ARTERIES TENDONS, FAILURE OF SURGERY TO ACCOMPLISH ITS INTENDED GOALS, PERSISTENT SYMPTOMS AND NEED FOR FURTHER SURGICAL INTERVENTION. WITH THIS IN MIND WE WILL PROCEED. I HAVE DISCUSSED WITH THE PATIENT THE PRE AND POSTOPERATIVE REGIMEN AND THE DOS AND DON'TS. PT VOICED UNDERSTANDING AND INFORMED CONSENT SIGNED.  Brynda Peon 07/27/2016, 1:32 PM

## 2016-07-29 ENCOUNTER — Telehealth: Payer: Self-pay | Admitting: Cardiovascular Disease

## 2016-07-29 ENCOUNTER — Encounter (HOSPITAL_COMMUNITY)
Admission: RE | Admit: 2016-07-29 | Discharge: 2016-07-29 | Disposition: A | Discharge status: Home / Self Care | Payer: Medicare Other | Source: Ambulatory Visit | Attending: Orthopedic Surgery | Admitting: Orthopedic Surgery

## 2016-07-29 ENCOUNTER — Encounter (HOSPITAL_COMMUNITY): Payer: Self-pay

## 2016-07-29 DIAGNOSIS — J449 Chronic obstructive pulmonary disease, unspecified: Secondary | ICD-10-CM | POA: Insufficient documentation

## 2016-07-29 DIAGNOSIS — Z7984 Long term (current) use of oral hypoglycemic drugs: Secondary | ICD-10-CM | POA: Insufficient documentation

## 2016-07-29 DIAGNOSIS — I2510 Atherosclerotic heart disease of native coronary artery without angina pectoris: Secondary | ICD-10-CM | POA: Insufficient documentation

## 2016-07-29 DIAGNOSIS — Z79899 Other long term (current) drug therapy: Secondary | ICD-10-CM | POA: Diagnosis not present

## 2016-07-29 DIAGNOSIS — Z87891 Personal history of nicotine dependence: Secondary | ICD-10-CM | POA: Insufficient documentation

## 2016-07-29 DIAGNOSIS — G4733 Obstructive sleep apnea (adult) (pediatric): Secondary | ICD-10-CM | POA: Insufficient documentation

## 2016-07-29 DIAGNOSIS — F4310 Post-traumatic stress disorder, unspecified: Secondary | ICD-10-CM | POA: Diagnosis not present

## 2016-07-29 DIAGNOSIS — I10 Essential (primary) hypertension: Secondary | ICD-10-CM | POA: Diagnosis not present

## 2016-07-29 DIAGNOSIS — Z7982 Long term (current) use of aspirin: Secondary | ICD-10-CM | POA: Diagnosis not present

## 2016-07-29 DIAGNOSIS — E1140 Type 2 diabetes mellitus with diabetic neuropathy, unspecified: Secondary | ICD-10-CM | POA: Diagnosis not present

## 2016-07-29 DIAGNOSIS — Z951 Presence of aortocoronary bypass graft: Secondary | ICD-10-CM | POA: Diagnosis not present

## 2016-07-29 DIAGNOSIS — Z01812 Encounter for preprocedural laboratory examination: Secondary | ICD-10-CM | POA: Diagnosis not present

## 2016-07-29 HISTORY — DX: Thoracic aortic aneurysm, without rupture: I71.2

## 2016-07-29 HISTORY — DX: Aneurysm of the ascending aorta, without rupture: I71.21

## 2016-07-29 HISTORY — DX: Polyneuropathy, unspecified: G62.9

## 2016-07-29 HISTORY — DX: Unspecified osteoarthritis, unspecified site: M19.90

## 2016-07-29 LAB — CBC
HCT: 45.8 % (ref 39.0–52.0)
Hemoglobin: 15.6 g/dL (ref 13.0–17.0)
MCH: 30.9 pg (ref 26.0–34.0)
MCHC: 34.1 g/dL (ref 30.0–36.0)
MCV: 90.7 fL (ref 78.0–100.0)
PLATELETS: 240 10*3/uL (ref 150–400)
RBC: 5.05 MIL/uL (ref 4.22–5.81)
RDW: 13.4 % (ref 11.5–15.5)
WBC: 5.3 10*3/uL (ref 4.0–10.5)

## 2016-07-29 LAB — BASIC METABOLIC PANEL
Anion gap: 12 (ref 5–15)
BUN: 24 mg/dL — AB (ref 6–20)
CHLORIDE: 104 mmol/L (ref 101–111)
CO2: 22 mmol/L (ref 22–32)
Calcium: 9.5 mg/dL (ref 8.9–10.3)
Creatinine, Ser: 0.78 mg/dL (ref 0.61–1.24)
GFR calc Af Amer: >60 mL/min (ref >60)
GLUCOSE: 178 mg/dL — AB (ref 65–99)
POTASSIUM: 4.4 mmol/L (ref 3.5–5.1)
SODIUM: 138 mmol/L (ref 135–145)

## 2016-07-29 LAB — APTT: aPTT: 31 seconds (ref 24–36)

## 2016-07-29 LAB — SURGICAL PCR SCREEN
MRSA, PCR: NEGATIVE
Staphylococcus aureus: POSITIVE — AB

## 2016-07-29 LAB — PROTIME-INR
INR: 1.01
Prothrombin Time: 13.3 seconds (ref 11.4–15.2)

## 2016-07-29 LAB — GLUCOSE, CAPILLARY: GLUCOSE-CAPILLARY: 212 mg/dL — AB (ref 65–99)

## 2016-07-29 NOTE — Telephone Encounter (Signed)
Debbie RN at [facility] lvm wanting to know when pt needs to stop his plavix for surgery, she can be reached at [phone]

## 2016-07-29 NOTE — Telephone Encounter (Signed)
LM with information on phone listed

## 2016-07-29 NOTE — Pre-Procedure Instructions (Signed)
[place]  07/29/2016      [facility] [address], [place] - [address] [place] [address] Phone: [phone] Fax: [phone]  [facility], [place] [address] [place] [address] Phone: [phone] Fax: [phone]    Your procedure is scheduled on Thursday, March 8th   Report to [facility] Admitting at 11:15 Am             (posted surgery time 1:15 - 2:45 pm)   Call this number if you have problems the MORNING of surgery:  [phone], or [phone] Monday thru Friday 8:00 - 4:30 pm   Remember:  Do not eat food or drink liquids after midnight Wednesday.   Take these medicines the morning of surgery with A SIP OF WATER : Coreg, Cymbalta.  Please use your inhaler that morning.                         4-5 days prior to surgery, STOP taking any Vitamins, Herbal Supplements, Anti-inflammatories              Plavix needs to be stopped___________________________________   Do not wear jewelry - no rings or watches.  Do not wear lotions, colognes or deoderant.             Men may shave face and neck.   Do not bring valuables to the hospital.  [facility] is not responsible for any belongings or valuables.  Contacts, dentures or bridgework may not be worn into surgery.  Leave your suitcase in the car.  After surgery it may be brought to your room. For patients admitted to the hospital, discharge time will be determined by your treatment team.  Please read over the following fact sheets that you were given. Pain Booklet, MRSA Information and Surgical Site Infection Prevention                 How to Manage Your Diabetes Before and After Surgery  Why is it important to control my blood sugar before and after surgery? . Improving blood sugar levels before and after surgery helps healing and can limit problems. . A way of improving blood sugar  control is eating a healthy diet by: o  Eating less sugar and carbohydrates o  Increasing activity/exercise o  Talking with your doctor about reaching your blood sugar goals . High blood sugars (greater than 180 mg/dL) can raise your risk of infections and slow your recovery, so you will need to focus on controlling your diabetes during the weeks before surgery. . Make sure that the doctor who takes care of your diabetes knows about your planned surgery including the date and location.  How do I manage my blood sugar before surgery? . Check your blood sugar at least 4 times a day, starting 2 days before surgery, to make sure that the level is not too high or low. o Check your blood sugar the morning of your surgery when you wake up and every 2 hours until you get to the Short Stay unit. o  . If your blood sugar is less than 70 mg/dL, you will need to treat for low blood sugar: o Do not take insulin. o Treat a low blood sugar (less than 70 mg/dL) with  cup of clear juice (cranberry or apple), 4 glucose tablets, OR glucose gel. o  o Recheck blood sugar in 15 minutes after treatment (to make sure it is greater than 70 mg/dL). If your blood sugar is not greater than 70 mg/dL on recheck, call [phone] for further instructions. . Report your blood sugar to the short stay nurse when you get to Short Stay.  . If you are admitted to the hospital after surgery: o Your blood sugar will be checked by the staff and you will probably be given insulin after surgery (instead of oral diabetes medicines) to make sure you have good blood sugar levels. o The goal for blood sugar control after surgery is 80-180 mg/dL      WHAT DO I DO ABOUT MY DIABETES MEDICATION?   [name] Do not take oral diabetes medicines (pills) the morning of surgery.         Patient Signature:  Date:   Nurse Signature:  Date:   Reviewed and Endorsed by [facility] Patient Education Committee, August 2015

## 2016-07-29 NOTE — Telephone Encounter (Signed)
Can hold 3 days prior.

## 2016-07-29 NOTE — Telephone Encounter (Signed)
Will forward to Dr Bronson Ing for direction

## 2016-07-29 NOTE — Progress Notes (Addendum)
PCP is Dr. Evette Doffing  LOV 06/2016. (medical clearance note from her office in chart) Also sees Dr. Milas Hock @ [facility] in [place] 06/2016 (have requested most recent lab results, as he said he had A1C done 2 weeks ago)      A1C 6.7 06/2016 Cardio is Dr. Bronson Ing in [place] [phone])  [place] 02/2016  (I have called the office as to when he needs to stop his plavix)  Call returned - plavix will be held 3 days prior to surgery - last dose 3/5--patient is aware. Several heart caths, last one 03/2015.  Had Open Heart (cabg x 4) 04/2015 @ [facility] by Dr. [name]. Currently feels fine, denies any chest pain, sob.  DM, type 2, currently only takes po med.  Am sugars run between 98-116. Had been tested for OSA approx. 14 yrs ago, positive results, but after numerous attempts at wearing the mask, he has been unable to tolerate it.  Was in the [employer], exposed to agent orange in [place] has neuropathy in both hands and feet.

## 2016-08-01 ENCOUNTER — Encounter (HOSPITAL_COMMUNITY): Payer: Self-pay

## 2016-08-01 NOTE — Progress Notes (Addendum)
Anesthesia Chart Review: Patient is a 71 year old male scheduled for right thumb carpometacarpal arthroplasty and tendon graft interposition on 08/04/16 by Dr. Caralyn Guile.  History includes CAD s/p CABG (LIMA-LAD, SVG-DIAG1, SVG-OM1, SVG-PDA) 05/04/15, OSA (not able to tolerate CPAP), neuropathy (possibly related to agent orange exposure), PTSD, former smoker (quit '89), COPD, DM2, HTN, diverticulitis, arthritis, cholecystectomy, lumbar laminectomy, partial colectomy, TAA (4.9 cm aortic root, 4.4 cm ascending TAA 04/2015; followed at [facility]). Admission to [facility] 07/15/16-07/16/16 for transient expressive aphasia, felt likely due to hypoglycemia, but also consider TIA. Brain MRI was negative. Echo and carotid dopplers canceled since hypoglycemia felt likely the cause. He was already on Plavix and ASA at that time.    - Dr. Assunta Found with [facility] who saw patient on 07/22/16 for hospital follow-up and preoperative clearance. Dr. Evette Doffing felt patient waso "reasonable to proceed with surgery." Also sees Dr. Milas Hock with [facility], reported last office visit 06/2016.  - Dr. Bronson Ing in [place]. He cleared patient for surgery (see telephone encounter 07/20/16) and gave permission to hold Plavix for 3 days prior to surgery (see telephone encounter 07/29/16).   Meds include albuterol, aspirin 81 mg, benazepril, Coreg, Plavix, Cymbalta, Zetia, Lopid, levothyroxine (not taking), metformin, Nitro, tetrahydrozoline ophthalmic.  BP 123/68   Pulse 71   Temp 36.7 C   Resp 18   Ht 5\' 8"  (1.727 m)   Wt 189 lb 1.6 oz (85.8 kg)   SpO2 97%   BMI 28.75 kg/m   EKG 07/15/16: SR, first degree AV block. Negative T wave in aVL. Small Q waves in inferior leads.   TEE (Intra-op) 05/04/15: Study Conclusions - Left ventricle: Systolic function was normal. The estimated   ejection fraction was in the range of 55% to 60%. Wall motion was   normal; there were no regional wall motion abnormalities. - Aortic  valve: There was mild regurgitation, with a single jet   originating from the central coaptation point. - Mitral valve: No evidence of vegetation. - Left atrium: No evidence of thrombus in the atrial cavity or   appendage. - Atrial septum: No defect or patent foramen ovale was identified. - Tricuspid valve: No evidence of vegetation.  Echo 05/01/15: Study Conclusions - Left ventricle: The cavity size was normal. There was mild   concentric hypertrophy. Systolic function was normal. The   estimated ejection fraction was in the range of 55% to 60%.   Probable mild hypokinesis of the basal-midinferior and   inferoseptal myocardium. Left ventricular diastolic function   parameters were normal. - Aortic valve: There was mild regurgitation.  Last cath was 04/29/15 prior to CABG.   Carotid U/S 04/30/15: Summary: Bilateral: intimal wall thickening CCA. Mild calcific plaque origin ICA. 1-395 ICA plaquing. Vertebral artery flow is an tegrade.  CXR 07/15/16: IMPRESSION: No acute cardiopulmonary disease.  Chest CT 05/03/15: IMPRESSION: 1. Aneurysmal dilatation of the aortic root (4.9 cm diameter) and ascending aorta (4.4 cm diameter). Ascending thoracic aortic aneurysm. Recommend semi-annual imaging followup by CTA or MRA. This recommendation follows 2010 ACCF/AHA/AATS/ACR/ASA/SCA/SCAI/SIR/STS/SVM Guidelines for the Diagnosis and Management of Patients With Thoracic Aortic Disease. Circulation. 2010; 121: [id number] 2. Three-vessel coronary atherosclerosis. 3. Subpleural left lower lobe 4 mm pulmonary nodule, probably benign subpleural lymph node. A follow-up chest CT is advised in 12 months. This recommendation follows the consensus statement: Guidelines for Management of Small Pulmonary Nodules Detected on CT Scans: A Statement from the [employer] as published in Radiology 2005; 237:395-400. 4. Mild centrilobular and paraseptal emphysema  and mild diffuse bronchial wall  thickening, which suggests COPD.  PFTs 05/01/15: Mild obstructive airways disease. Insignificant response to BD. Increased diffusion. Normal lung volumes.   Preoperative labs noted. Cr 0.78. CBC, PT/PTT WNL. A1c 6.7 on 07/16/16. TSH 03/16/16 was normal at 2.100.   Patient with recent medical evaluation with surgical clearance. He also has cardiac clearance. Only instructed to hold Plavix for three days by cardiology. I left a voice message with Margarita Grizzle at Dr. Angus Palms office regarding Plavix instructions, so he can contact patient and/or cardiology if any concerns. [facility] records also re-requested (including last primary care note and chest CT), but are still pending. I'll update my note if any additional records received.  George Hugh [facility] Short Stay Center/Anesthesiology Phone [phone] 08/02/2016 10:10 AM  Addendum: Additional [facility] records received. Last office visit with Dr. Rodney Langton was on 07/14/16. Last CTA of the chest done there was on 02/12/15 and showed aortic root up to 4.6 cm. I called patient, he said that it was his understanding that Dr. Bronson Ing was following this TAA/aortic root dilatation. I discussed that from notes, I believe that Dr. Bronson Ing thought the [facility] was monitoring. I told him that I will send a message to Dr. Bronson Ing, and patient can follow-up with him about recommended testing. He also said that he has been off thyroid medications for years and that follow-up testing has been "okay." I reviewed 04/2015 CT findings with anesthesiologist Dr. Tobias Alexander. Agree with letting Dr. Bronson Ing know so he can recommend future follow-up, but did not think this would interfere with plans for surgery tomorrow. (I sent Dr. Bronson Ing a staff message asking him to address TAA/aortic root dilatation follow-up with patient.)  George Hugh [facility] Short Stay Center/Anesthesiology Phone [phone] 08/03/2016 4:25 PM

## 2016-08-04 ENCOUNTER — Encounter (HOSPITAL_COMMUNITY): Payer: Self-pay

## 2016-08-04 ENCOUNTER — Ambulatory Visit (HOSPITAL_COMMUNITY): Payer: Medicare Other | Admitting: Anesthesiology

## 2016-08-04 ENCOUNTER — Ambulatory Visit (HOSPITAL_COMMUNITY): Payer: Medicare Other | Admitting: Vascular Surgery

## 2016-08-04 ENCOUNTER — Ambulatory Visit (HOSPITAL_COMMUNITY)
Admission: RE | Admit: 2016-08-04 | Discharge: 2016-08-04 | Disposition: A | Discharge status: Home / Self Care | Payer: Medicare Other | Source: Ambulatory Visit | Attending: Orthopedic Surgery | Admitting: Orthopedic Surgery

## 2016-08-04 ENCOUNTER — Telehealth: Payer: Self-pay | Admitting: *Deleted

## 2016-08-04 ENCOUNTER — Encounter (HOSPITAL_COMMUNITY)
Admission: RE | Disposition: A | Discharge status: Home / Self Care | Payer: Self-pay | Source: Ambulatory Visit | Attending: Orthopedic Surgery

## 2016-08-04 DIAGNOSIS — I10 Essential (primary) hypertension: Secondary | ICD-10-CM | POA: Diagnosis not present

## 2016-08-04 DIAGNOSIS — I712 Thoracic aortic aneurysm, without rupture, unspecified: Secondary | ICD-10-CM

## 2016-08-04 DIAGNOSIS — F4310 Post-traumatic stress disorder, unspecified: Secondary | ICD-10-CM | POA: Diagnosis not present

## 2016-08-04 DIAGNOSIS — Z7984 Long term (current) use of oral hypoglycemic drugs: Secondary | ICD-10-CM | POA: Insufficient documentation

## 2016-08-04 DIAGNOSIS — Z8249 Family history of ischemic heart disease and other diseases of the circulatory system: Secondary | ICD-10-CM | POA: Diagnosis not present

## 2016-08-04 DIAGNOSIS — E669 Obesity, unspecified: Secondary | ICD-10-CM | POA: Diagnosis not present

## 2016-08-04 DIAGNOSIS — M5030 Other cervical disc degeneration, unspecified cervical region: Secondary | ICD-10-CM | POA: Insufficient documentation

## 2016-08-04 DIAGNOSIS — G4730 Sleep apnea, unspecified: Secondary | ICD-10-CM | POA: Insufficient documentation

## 2016-08-04 DIAGNOSIS — M19041 Primary osteoarthritis, right hand: Secondary | ICD-10-CM | POA: Diagnosis not present

## 2016-08-04 DIAGNOSIS — I2510 Atherosclerotic heart disease of native coronary artery without angina pectoris: Secondary | ICD-10-CM | POA: Insufficient documentation

## 2016-08-04 DIAGNOSIS — Z888 Allergy status to other drugs, medicaments and biological substances status: Secondary | ICD-10-CM | POA: Diagnosis not present

## 2016-08-04 DIAGNOSIS — M1811 Unilateral primary osteoarthritis of first carpometacarpal joint, right hand: Secondary | ICD-10-CM | POA: Diagnosis not present

## 2016-08-04 DIAGNOSIS — Z87891 Personal history of nicotine dependence: Secondary | ICD-10-CM | POA: Insufficient documentation

## 2016-08-04 DIAGNOSIS — I739 Peripheral vascular disease, unspecified: Secondary | ICD-10-CM | POA: Diagnosis not present

## 2016-08-04 DIAGNOSIS — Z79899 Other long term (current) drug therapy: Secondary | ICD-10-CM | POA: Diagnosis not present

## 2016-08-04 DIAGNOSIS — G8918 Other acute postprocedural pain: Secondary | ICD-10-CM | POA: Diagnosis not present

## 2016-08-04 DIAGNOSIS — Z6828 Body mass index (BMI) 28.0-28.9, adult: Secondary | ICD-10-CM | POA: Insufficient documentation

## 2016-08-04 DIAGNOSIS — Z951 Presence of aortocoronary bypass graft: Secondary | ICD-10-CM | POA: Diagnosis not present

## 2016-08-04 DIAGNOSIS — Z9889 Other specified postprocedural states: Secondary | ICD-10-CM | POA: Insufficient documentation

## 2016-08-04 DIAGNOSIS — Z96651 Presence of right artificial knee joint: Secondary | ICD-10-CM | POA: Insufficient documentation

## 2016-08-04 DIAGNOSIS — M5136 Other intervertebral disc degeneration, lumbar region: Secondary | ICD-10-CM | POA: Diagnosis not present

## 2016-08-04 DIAGNOSIS — J449 Chronic obstructive pulmonary disease, unspecified: Secondary | ICD-10-CM | POA: Insufficient documentation

## 2016-08-04 DIAGNOSIS — Z833 Family history of diabetes mellitus: Secondary | ICD-10-CM | POA: Insufficient documentation

## 2016-08-04 DIAGNOSIS — Z9049 Acquired absence of other specified parts of digestive tract: Secondary | ICD-10-CM | POA: Diagnosis not present

## 2016-08-04 DIAGNOSIS — E119 Type 2 diabetes mellitus without complications: Secondary | ICD-10-CM | POA: Diagnosis not present

## 2016-08-04 HISTORY — PX: CARPOMETACARPAL (CMC) FUSION OF THUMB: SHX6290

## 2016-08-04 LAB — GLUCOSE, CAPILLARY: GLUCOSE-CAPILLARY: 117 mg/dL — AB (ref 65–99)

## 2016-08-04 SURGERY — CARPOMETACARPAL (CMC) FUSION OF THUMB
Anesthesia: General | Site: Hand | Laterality: Right

## 2016-08-04 MED ORDER — DEXAMETHASONE SODIUM PHOSPHATE 10 MG/ML IJ SOLN
INTRAMUSCULAR | Status: DC | PRN
  Administered 2016-08-04: 10 mg via INTRAVENOUS

## 2016-08-04 MED ORDER — PROPOFOL 10 MG/ML IV BOLUS
INTRAVENOUS | Status: DC | PRN
  Administered 2016-08-04: 200 mg via INTRAVENOUS

## 2016-08-04 MED ORDER — LACTATED RINGERS IV SOLN
INTRAVENOUS | Status: DC
  Administered 2016-08-04 (×2): via INTRAVENOUS

## 2016-08-04 MED ORDER — FENTANYL CITRATE (PF) 100 MCG/2ML IJ SOLN
INTRAMUSCULAR | Status: AC
  Filled 2016-08-04: qty 2

## 2016-08-04 MED ORDER — PHENYLEPHRINE HCL 10 MG/ML IJ SOLN
INTRAMUSCULAR | Status: DC | PRN
  Administered 2016-08-04: 80 ug via INTRAVENOUS

## 2016-08-04 MED ORDER — FENTANYL CITRATE (PF) 100 MCG/2ML IJ SOLN
100.0000 ug | Freq: Once | INTRAMUSCULAR | Status: AC
  Administered 2016-08-04: 50 ug via INTRAVENOUS

## 2016-08-04 MED ORDER — DEXAMETHASONE SODIUM PHOSPHATE 10 MG/ML IJ SOLN
INTRAMUSCULAR | Status: AC
  Filled 2016-08-04: qty 1

## 2016-08-04 MED ORDER — MIDAZOLAM HCL 2 MG/2ML IJ SOLN
2.0000 mg | Freq: Once | INTRAMUSCULAR | Status: AC
  Administered 2016-08-04: 1 mg via INTRAVENOUS

## 2016-08-04 MED ORDER — CEFAZOLIN SODIUM-DEXTROSE 2-4 GM/100ML-% IV SOLN
INTRAVENOUS | Status: AC
  Filled 2016-08-04: qty 100

## 2016-08-04 MED ORDER — EPHEDRINE SULFATE 50 MG/ML IJ SOLN
INTRAMUSCULAR | Status: DC | PRN
  Administered 2016-08-04 (×2): 10 mg via INTRAVENOUS
  Administered 2016-08-04: 5 mg via INTRAVENOUS

## 2016-08-04 MED ORDER — 0.9 % SODIUM CHLORIDE (POUR BTL) OPTIME
TOPICAL | Status: DC | PRN
  Administered 2016-08-04: 1000 mL

## 2016-08-04 MED ORDER — PROPOFOL 10 MG/ML IV BOLUS
INTRAVENOUS | Status: AC
  Filled 2016-08-04: qty 20

## 2016-08-04 MED ORDER — MIDAZOLAM HCL 2 MG/2ML IJ SOLN
INTRAMUSCULAR | Status: AC
  Filled 2016-08-04: qty 2

## 2016-08-04 MED ORDER — CEFAZOLIN SODIUM-DEXTROSE 2-4 GM/100ML-% IV SOLN
2.0000 g | INTRAVENOUS | Status: AC
  Administered 2016-08-04: 2 g via INTRAVENOUS

## 2016-08-04 MED ORDER — EPHEDRINE 5 MG/ML INJ
INTRAVENOUS | Status: AC
  Filled 2016-08-04: qty 10

## 2016-08-04 MED ORDER — FENTANYL CITRATE (PF) 100 MCG/2ML IJ SOLN
INTRAMUSCULAR | Status: AC
Start: 2016-08-04 — End: ?
  Filled 2016-08-04: qty 4

## 2016-08-04 MED ORDER — LIDOCAINE HCL (CARDIAC) 20 MG/ML IV SOLN
INTRAVENOUS | Status: DC | PRN
  Administered 2016-08-04: 60 mg via INTRAVENOUS

## 2016-08-04 MED ORDER — PHENYLEPHRINE 40 MCG/ML (10ML) SYRINGE FOR IV PUSH (FOR BLOOD PRESSURE SUPPORT)
PREFILLED_SYRINGE | INTRAVENOUS | Status: AC
  Filled 2016-08-04: qty 10

## 2016-08-04 MED ORDER — ONDANSETRON HCL 4 MG/2ML IJ SOLN
INTRAMUSCULAR | Status: AC
  Filled 2016-08-04: qty 2

## 2016-08-04 MED ORDER — BUPIVACAINE-EPINEPHRINE (PF) 0.5% -1:200000 IJ SOLN
INTRAMUSCULAR | Status: DC | PRN
  Administered 2016-08-04: 30 mL via PERINEURAL

## 2016-08-04 MED ORDER — CHLORHEXIDINE GLUCONATE 4 % EX LIQD: 60.0000 mL | Freq: Once | CUTANEOUS | Status: DC

## 2016-08-04 MED ORDER — ONDANSETRON HCL 4 MG/2ML IJ SOLN
INTRAMUSCULAR | Status: DC | PRN
  Administered 2016-08-04: 4 mg via INTRAVENOUS

## 2016-08-04 MED ORDER — FENTANYL CITRATE (PF) 100 MCG/2ML IJ SOLN: 25.0000 ug | INTRAMUSCULAR | Status: DC | PRN

## 2016-08-04 MED ORDER — BUPIVACAINE HCL (PF) 0.25 % IJ SOLN
INTRAMUSCULAR | Status: AC
  Filled 2016-08-04: qty 30

## 2016-08-04 SURGICAL SUPPLY — 56 items
ALLOGRAFT MATRIX HD 4CMX5CM (Tissue) ×1 IMPLANT
ALLOGRAFT MATRIX HD 4X5 (Tissue) ×3 IMPLANT
BANDAGE ACE 3X5.8 VEL STRL LF (GAUZE/BANDAGES/DRESSINGS) ×4 IMPLANT
BANDAGE ACE 4X5 VEL STRL LF (GAUZE/BANDAGES/DRESSINGS) ×4 IMPLANT
BANDAGE ELASTIC 3 VELCRO ST LF (GAUZE/BANDAGES/DRESSINGS) IMPLANT
BLADE AVERAGE 25MMX9MM (BLADE) ×1
BLADE AVERAGE 25X9 (BLADE) ×3 IMPLANT
BNDG COHESIVE 1X5 TAN STRL LF (GAUZE/BANDAGES/DRESSINGS) IMPLANT
BNDG GAUZE ELAST 4 BULKY (GAUZE/BANDAGES/DRESSINGS) IMPLANT
CORDS BIPOLAR (ELECTRODE) ×4 IMPLANT
COVER SURGICAL LIGHT HANDLE (MISCELLANEOUS) ×4 IMPLANT
CUFF TOURNIQUET SINGLE 18IN (TOURNIQUET CUFF) ×4 IMPLANT
DRAPE OEC MINIVIEW 54X84 (DRAPES) ×4 IMPLANT
DRAPE SURG 17X23 STRL (DRAPES) ×4 IMPLANT
DRSG ADAPTIC 3X8 NADH LF (GAUZE/BANDAGES/DRESSINGS) IMPLANT
DRSG EMULSION OIL 3X3 NADH (GAUZE/BANDAGES/DRESSINGS) ×4 IMPLANT
GAUZE SPONGE 2X2 8PLY STRL LF (GAUZE/BANDAGES/DRESSINGS) IMPLANT
GAUZE SPONGE 4X4 12PLY STRL (GAUZE/BANDAGES/DRESSINGS) IMPLANT
GAUZE SPONGE 4X4 12PLY STRL LF (GAUZE/BANDAGES/DRESSINGS) ×4 IMPLANT
GAUZE XEROFORM 1X8 LF (GAUZE/BANDAGES/DRESSINGS) ×4 IMPLANT
GLOVE BIOGEL PI IND STRL 8.5 (GLOVE) ×2 IMPLANT
GLOVE BIOGEL PI INDICATOR 8.5 (GLOVE) ×2
GLOVE SURG ORTHO 8.0 STRL STRW (GLOVE) ×4 IMPLANT
GOWN STRL REUS W/ TWL LRG LVL3 (GOWN DISPOSABLE) ×4 IMPLANT
GOWN STRL REUS W/ TWL XL LVL3 (GOWN DISPOSABLE) ×2 IMPLANT
GOWN STRL REUS W/TWL LRG LVL3 (GOWN DISPOSABLE) ×4
GOWN STRL REUS W/TWL XL LVL3 (GOWN DISPOSABLE) ×2
KIT BASIN OR (CUSTOM PROCEDURE TRAY) ×4 IMPLANT
KIT ROOM TURNOVER OR (KITS) ×4 IMPLANT
MANIFOLD NEPTUNE II (INSTRUMENTS) ×4 IMPLANT
NEEDLE HYPO 25GX1X1/2 BEV (NEEDLE) IMPLANT
NS IRRIG 1000ML POUR BTL (IV SOLUTION) ×4 IMPLANT
PACK ORTHO EXTREMITY (CUSTOM PROCEDURE TRAY) ×4 IMPLANT
PAD ARMBOARD 7.5X6 YLW CONV (MISCELLANEOUS) ×8 IMPLANT
PAD CAST 3X4 CTTN HI CHSV (CAST SUPPLIES) ×2 IMPLANT
PAD CAST 4YDX4 CTTN HI CHSV (CAST SUPPLIES) ×2 IMPLANT
PADDING CAST COTTON 3X4 STRL (CAST SUPPLIES) ×2
PADDING CAST COTTON 4X4 STRL (CAST SUPPLIES) ×2
SOAP 2 % CHG 4 OZ (WOUND CARE) ×4 IMPLANT
SPLINT PLASTER CAST XFAST 3X15 (CAST SUPPLIES) ×2 IMPLANT
SPLINT PLASTER XTRA FASTSET 3X (CAST SUPPLIES) ×2
SPONGE GAUZE 2X2 STER 10/PKG (GAUZE/BANDAGES/DRESSINGS)
SUCTION FRAZIER HANDLE 10FR (MISCELLANEOUS)
SUCTION TUBE FRAZIER 10FR DISP (MISCELLANEOUS) IMPLANT
SUT ETHIBOND 2 0 SH (SUTURE) ×4 IMPLANT
SUT MERSILENE 4 0 P 3 (SUTURE) IMPLANT
SUT MNCRL AB 4-0 PS2 18 (SUTURE) ×4 IMPLANT
SUT PROLENE 4 0 PS 2 18 (SUTURE) IMPLANT
SUT PROLENE 4 0 TF (SUTURE) ×4 IMPLANT
SYR CONTROL 10ML LL (SYRINGE) IMPLANT
TOWEL OR 17X24 6PK STRL BLUE (TOWEL DISPOSABLE) ×4 IMPLANT
TOWEL OR 17X26 10 PK STRL BLUE (TOWEL DISPOSABLE) ×4 IMPLANT
TUBE CONNECTING 12'X1/4 (SUCTIONS)
TUBE CONNECTING 12X1/4 (SUCTIONS) IMPLANT
UNDERPAD 30X30 (UNDERPADS AND DIAPERS) ×4 IMPLANT
WATER STERILE IRR 1000ML POUR (IV SOLUTION) ×4 IMPLANT

## 2016-08-04 NOTE — Anesthesia Preprocedure Evaluation (Addendum)
Anesthesia Evaluation  Patient identified by MRN, date of birth, ID band Patient awake    Reviewed: Allergy & Precautions, NPO status , Patient's Chart, lab work & pertinent test results  Airway Mallampati: III  TM Distance: >3 FB Neck ROM: Full    Dental  (+) Teeth Intact, Dental Advisory Given   Pulmonary sleep apnea , COPD, former smoker,    breath sounds clear to auscultation       Cardiovascular hypertension, + CAD, + CABG and + Peripheral Vascular Disease   Rhythm:Regular Rate:Normal     Neuro/Psych PSYCHIATRIC DISORDERS  Neuromuscular disease    GI/Hepatic negative GI ROS, Neg liver ROS,   Endo/Other  diabetes, Type 2, Oral Hypoglycemic Agents  Renal/GU negative Renal ROS  negative genitourinary   Musculoskeletal  (+) Arthritis ,   Abdominal   Peds negative pediatric ROS (+)  Hematology negative hematology ROS (+)   Anesthesia Other Findings   Reproductive/Obstetrics negative OB ROS                            Lab Results  Component Value Date   WBC 5.3 07/29/2016   HGB 15.6 07/29/2016   HCT 45.8 07/29/2016   MCV 90.7 07/29/2016   PLT 240 07/29/2016   Lab Results  Component Value Date   CREATININE 0.78 07/29/2016   BUN 24 (H) 07/29/2016   NA 138 07/29/2016   K 4.4 07/29/2016   CL 104 07/29/2016   CO2 22 07/29/2016   Lab Results  Component Value Date   INR 1.01 07/29/2016   INR 1.02 09/26/2015   INR 1.28 05/06/2015   EKG: normal sinus rhythm, 1st degree AV block.  Echo 2016  - Left ventricle: Systolic function was normal. The estimated   ejection fraction was in the range of 55% to 60%. Wall motion was   normal; there were no regional wall motion abnormalities. - Aortic valve: There was mild regurgitation, with a single jet   originating from the central coaptation point. - Mitral valve: No evidence of vegetation. - Left atrium: No evidence of thrombus in the  atrial cavity or   appendage. - Atrial septum: No defect or patent foramen ovale was identified. - Tricuspid valve: No evidence of vegetation.  Anesthesia Physical Anesthesia Plan  ASA: III  Anesthesia Plan: General   Post-op Pain Management: GA combined w/ Regional for post-op pain   Induction: Intravenous  Airway Management Planned: LMA  Additional Equipment:   Intra-op Plan:   Post-operative Plan: Extubation in OR  Informed Consent: I have reviewed the patients History and Physical, chart, labs and discussed the procedure including the risks, benefits and alternatives for the proposed anesthesia with the patient or authorized representative who has indicated his/her understanding and acceptance.   Dental advisory given  Plan Discussed with: CRNA  Anesthesia Plan Comments:         Anesthesia Quick Evaluation

## 2016-08-04 NOTE — Transfer of Care (Signed)
Immediate Anesthesia Transfer of Care Note  Patient: Eduardo [name]  Procedure(s) Performed: Procedure(s): CARPOMETACARPAL ([place]) FUSION OF THUMB (Right)  Patient Location: PACU  Anesthesia Type:General  Level of Consciousness: awake, oriented and patient cooperative  Airway & Oxygen Therapy: Patient Spontanous Breathing and Patient connected to face mask oxygen  Post-op Assessment: Report given to RN and Post -op Vital signs reviewed and stable  Post vital signs: Reviewed  Last Vitals:  Vitals:   08/04/16 1107 08/04/16 1508  BP: (!) 147/74 136/86  Pulse: 63 (!) 55  Resp: 18 15  Temp: 36.4 C 36.7 C    Last Pain:  Vitals:   08/04/16 1508  TempSrc:   PainSc: 0-No pain         Complications: No apparent anesthesia complications

## 2016-08-04 NOTE — Progress Notes (Signed)
Orthopedic Tech Progress Note Patient Details:  Eduardo [name] [facility] 01/27/1946 [id number]  Ortho Devices Type of Ortho Device: Arm sling Ortho Device/Splint Location: RUE Ortho Device/Splint Interventions: Ordered, Application   [name] 08/04/2016, 3:40 PM

## 2016-08-04 NOTE — Progress Notes (Signed)
Orthopedic Tech Progress Note Patient Details:  Numair Masden [facility] 07-23-1945 [id number]  Ortho Devices Type of Ortho Device: Arm sling Ortho Device/Splint Location: RUE Ortho Device/Splint Interventions: Ordered, Application   [name] 08/04/2016, 3:41 PM

## 2016-08-04 NOTE — Anesthesia Procedure Notes (Signed)
Procedure Name: LMA Insertion Date/Time: 08/04/2016 1:31 PM Performed by: Luciana Axe K Pre-anesthesia Checklist: Patient identified, Emergency Drugs available, Suction available and Patient being monitored Patient Re-evaluated:Patient Re-evaluated prior to inductionOxygen Delivery Method: Circle System Utilized Preoxygenation: Pre-oxygenation with 100% oxygen Intubation Type: IV induction Ventilation: Mask ventilation without difficulty LMA: LMA inserted LMA Size: 4.0 Number of attempts: 1 Airway Equipment and Method: Bite block Placement Confirmation: positive ETCO2 and breath sounds checked- equal and bilateral Tube secured with: Tape Dental Injury: Teeth and Oropharynx as per pre-operative assessment

## 2016-08-04 NOTE — Telephone Encounter (Signed)
Will call patient tomorrow as he just went home this afternoon from thumb surgery.

## 2016-08-04 NOTE — Discharge Instructions (Signed)
KEEP BANDAGE CLEAN AND DRY °CALL OFFICE FOR F/U APPT [phone] IN 14 DAYS °DR Naseer Hearn CELL [phone] °KEEP HAND ELEVATED ABOVE HEART °OK TO APPLY ICE TO OPERATIVE AREA °CONTACT OFFICE IF ANY WORSENING PAIN OR CONCERNS. °

## 2016-08-04 NOTE — Brief Op Note (Signed)
Job [id number] Right thumb CMC arthroplasty Home today F/u in office in 2 weeks

## 2016-08-04 NOTE — Progress Notes (Signed)
Orthopedic Tech Progress Note Patient Details:  Eduardo [name] [facility] 1945-12-31 [id number]  Ortho Devices Type of Ortho Device: Arm sling Ortho Device/Splint Location: RUE Ortho Device/Splint Interventions: Ordered, Application   [name] 08/04/2016, 3:39 PM

## 2016-08-04 NOTE — Anesthesia Procedure Notes (Signed)
Anesthesia Regional Block: Supraclavicular block   Pre-Anesthetic Checklist: ,, timeout performed, Correct Patient, Correct Site, Correct Laterality, Correct Procedure, Correct Position, site marked, Risks and benefits discussed,  Surgical consent,  Pre-op evaluation,  At surgeon's request and post-op pain management  Laterality: Right  Prep: chloraprep       Needles:  Injection technique: Single-shot  Needle Type: Echogenic Needle     Needle Length: 9cm  Needle Gauge: 21     Additional Needles:   Procedures: ultrasound guided,,,,,,,,  Narrative:  Start time: 08/04/2016 1:10 PM End time: 08/04/2016 1:15 PM Injection made incrementally with aspirations every 5 mL.  Performed by: Personally  Anesthesiologist: Suella Broad D  Additional Notes: Tolerated well.

## 2016-08-04 NOTE — Telephone Encounter (Signed)
-----   Message from Herminio Commons, MD sent at 08/04/2016  9:24 AM EST ----- Regarding: FW: aortic root/TAA  Eduardo [name], Let's go ahead and proceed with CT angiogram of chest to evaluate thoracic aortic aneurysm.    ----- Message ----- From: Jacinta Shoe, PA-C Sent: 08/03/2016   4:19 PM To: Herminio Commons, MD Subject: aortic root/TAA                                Dr. Bronson Ing, I am a PA with anesthesia at [facility]. I was reviewing Eduardo [name] chart for thumb arthroplasty tomorrow. He has known aortic root dilation/TAA from 04/2015 chest CT done prior to his CABG. Notes in Epic indicate that the [facility] is following, but when I received [facility] records he had not had a chest CTA there since 01/2015. I called patient, and it was his understanding that you were following his TAA/aortic root. I told him that I would let you know so you can discuss future follow-up with him. Contact me if you have any questions.  Thanks, [name] Short Stay Center/Anesthesiology Phone [phone] 08/03/2016 4:23 PM

## 2016-08-04 NOTE — Progress Notes (Signed)
Orthopedic Tech Progress Note Patient Details:  Eduardo [name] [facility] Jan 22, 1946 [id number]  Ortho Devices Type of Ortho Device: Arm sling Ortho Device/Splint Location: RUE Ortho Device/Splint Interventions: Ordered, Application   [name] 08/04/2016, 3:41 PM

## 2016-08-05 ENCOUNTER — Encounter (HOSPITAL_COMMUNITY): Payer: Self-pay | Admitting: Orthopedic Surgery

## 2016-08-05 NOTE — Telephone Encounter (Signed)
Patient notified.  Order will be put into epic & fwd to [facility]) for scheduling .

## 2016-08-05 NOTE — Op Note (Signed)
NAME:  Eduardo, [name] NO.:  [id number]  MEDICAL RECORD NO.:  [id number]  LOCATION:                                 FACILITY:  PHYSICIAN:  Eduardo [name], M.D.DATE OF BIRTH:  January 18, 1946  DATE OF PROCEDURE:  08/04/2016 DATE OF DISCHARGE:                              OPERATIVE REPORT   PREOPERATIVE DIAGNOSIS:  Right thumb end-stage bone-on-bone carpometacarpal arthritis.  POSTOPERATIVE DIAGNOSIS:  Right thumb end-stage bone-on-bone carpometacarpal arthritis.  ATTENDING PHYSICIAN:  Eduardo Hoff, M.D., was scrubbed and present for the entire procedure.  ASSISTANT SURGEON:  Eduardo [name], [facility], who scrubbed and necessary for the entire procedure, helped aid in exposure, arthoplasty, closure and application of splint.  PROCEDURES: 1. Right thumb carpometacarpal arthroplasty with interposition of     allograft matrix. 2. Radiographs 3 views, right thumb.  RADIOGRAPHIC INTERPRETATION:  AP, lateral, and oblique views of the thumb did show the [facility] arthroplasty with interposition with good preservation of the interpositional arthroplasty site.  SURGICAL INDICATION:  Eduardo [name] is a 71 year old right-hand- dominant gentleman, who had previously undergone left thumb CMC arthroplasty in [place].  The patient has done very well following that procedure and requested to undergo the same procedure on the right side.  Risks, benefits, and alternatives were discussed in detail with the patient.  Signed informed consent was obtained.  Risks include, but not limited to bleeding, infection, damage to nearby nerves, arteries, or tendons; loss of motion of the wrist and digits, incomplete relief of symptoms, persistent and need for further surgical intervention.  SURGICAL IMPLANTS:  [employer] Matrix 4 cm x 5 cm.  DESCRIPTION OF PROCEDURE:  The patient was properly identified in the preoperative holding area, marked with a permanent marker made on  the right thumb to indicate the correct operative side.  The patient was brought back to the operating room, placed supine on the anesthesia table where general anesthetic was administered.  The patient tolerated this well.  A well-padded tourniquet placed on the right brachium, sealed with 1000 drape.  Right upper extremity was then prepped and draped in normal sterile fashion.  Time-out was called.  Correct site was identified, and procedure then begun.  Attention was then turned to the right thumb.  A longitudinal incision was made directly at the dorsal aspect of the thumb CMC joint.  Dissection was carried down through the skin and subcutaneous tissue.  Thick capsular flaps were then created exposing the thumb metacarpal trapezium joint.  Once this was carried out, using a small oscillating saw, the proximal portion of thumb metacarpal as well as distal portion of trapezium was then resected with careful protection of the underlying FCR.  The wound was then thoroughly irrigated.  Loose bony fragments were then removed from the arthroplasty site.  Once the bone ends were prepared, 2-0 Ethibond suture was then placed in the volar capsule of the region where the bone was resected.  Anteriorly technique was then used to place the Matrix into the interpositional arthroplasty.  This was tied down nicely with 2- 0 Ethibond suture.  Once this was carried out, final radiographs were then obtained.  Thick capsular closure was then carried out with 2-0 Ethibond suture.  Subcutaneous tissues closed with Monocryl and skin closed with Prolene sutures.  Adaptic dressing, sterile compressive bandage then applied.  The patient was placed in a well-padded thumb spica splint, extubated, and taken to recovery room in good condition.  POSTPROCEDURE PLAN:  The patient discharged home.  See him back in the office approximately 2 weeks for wound check, suture removal, x-rays, transition to a removable  thumb spica splint, and then gradually increase in activity.  Radiographs at each visit.     Eduardo [name], M.D.   ______________________________ Eduardo [name], M.D.    FWO/MEDQ  D:  08/04/2016  T:  08/04/2016  Job:  [id number]

## 2016-08-05 NOTE — Anesthesia Postprocedure Evaluation (Addendum)
Anesthesia Post Note  Patient: Eduardo [name]  Procedure(s) Performed: Procedure(s) (LRB): CARPOMETACARPAL ([place]) FUSION OF THUMB (Right)  Patient location during evaluation: PACU Anesthesia Type: General and Regional Level of consciousness: awake and alert Pain management: pain level controlled Vital Signs Assessment: post-procedure vital signs reviewed and stable Respiratory status: spontaneous breathing, nonlabored ventilation, respiratory function stable and patient connected to nasal cannula oxygen Cardiovascular status: blood pressure returned to baseline and stable Postop Assessment: no signs of nausea or vomiting Anesthetic complications: no       Last Vitals:  Vitals:   08/04/16 1530 08/04/16 1545  BP: (!) 146/79 139/78  Pulse:    Resp:    Temp:      Last Pain:  Vitals:   08/04/16 1508  TempSrc:   PainSc: 0-No pain                 Effie Berkshire

## 2016-08-08 ENCOUNTER — Encounter (HOSPITAL_COMMUNITY): Payer: Self-pay | Admitting: Orthopedic Surgery

## 2016-08-08 LAB — GLUCOSE, CAPILLARY: GLUCOSE-CAPILLARY: 121 mg/dL — AB (ref 65–99)

## 2016-08-11 ENCOUNTER — Ambulatory Visit: Payer: Medicare Other | Admitting: Pediatrics

## 2016-08-12 ENCOUNTER — Telehealth: Payer: Self-pay | Admitting: Cardiovascular Disease

## 2016-08-12 NOTE — Telephone Encounter (Signed)
Pre-cert Verification for the following procedure   CT ANGIO CHEST AORTA W/CM WITH CONTRAST Scheduled for 08/24/2016 at [facility]

## 2016-08-19 DIAGNOSIS — Z4789 Encounter for other orthopedic aftercare: Secondary | ICD-10-CM | POA: Diagnosis not present

## 2016-08-19 DIAGNOSIS — M1811 Unilateral primary osteoarthritis of first carpometacarpal joint, right hand: Secondary | ICD-10-CM | POA: Diagnosis not present

## 2016-08-24 ENCOUNTER — Ambulatory Visit (HOSPITAL_COMMUNITY)
Admission: RE | Admit: 2016-08-24 | Discharge: 2016-08-24 | Disposition: A | Discharge status: Home / Self Care | Payer: Medicare Other | Source: Ambulatory Visit | Attending: Cardiovascular Disease | Admitting: Cardiovascular Disease

## 2016-08-24 ENCOUNTER — Telehealth: Payer: Self-pay | Admitting: *Deleted

## 2016-08-24 DIAGNOSIS — R918 Other nonspecific abnormal finding of lung field: Secondary | ICD-10-CM | POA: Diagnosis not present

## 2016-08-24 DIAGNOSIS — I712 Thoracic aortic aneurysm, without rupture, unspecified: Secondary | ICD-10-CM

## 2016-08-24 DIAGNOSIS — I7121 Aneurysm of the ascending aorta, without rupture: Secondary | ICD-10-CM

## 2016-08-24 MED ORDER — IOPAMIDOL (ISOVUE-370) INJECTION 76%
100.0000 mL | Freq: Once | INTRAVENOUS | Status: AC | PRN
  Administered 2016-08-24: 100 mL via INTRAVENOUS

## 2016-08-24 NOTE — Telephone Encounter (Signed)
Notes recorded by Laurine Blazer, LPN on 2/48/2500 at 3:70 PM EDT Patient notified. Copy to pmd. Patient agrees to referral. Specifically request Dr. Pia Mau if possible. ------  Notes recorded by Herminio Commons, MD on 08/24/2016 at 12:35 PM EDT Have him see CT surgery just so that they will also be following him periodically.

## 2016-08-30 ENCOUNTER — Other Ambulatory Visit: Payer: Self-pay | Admitting: *Deleted

## 2016-08-30 DIAGNOSIS — I7121 Aneurysm of the ascending aorta, without rupture: Secondary | ICD-10-CM

## 2016-08-30 DIAGNOSIS — I712 Thoracic aortic aneurysm, without rupture: Secondary | ICD-10-CM

## 2016-09-08 ENCOUNTER — Institutional Professional Consult (permissible substitution) (INDEPENDENT_AMBULATORY_CARE_PROVIDER_SITE_OTHER): Payer: Medicare Other | Admitting: Cardiothoracic Surgery

## 2016-09-08 ENCOUNTER — Encounter: Payer: Self-pay | Admitting: Cardiothoracic Surgery

## 2016-09-08 VITALS — BP 132/78 | HR 72 | Resp 16 | Ht 68.0 in | Wt 184.0 lb

## 2016-09-08 DIAGNOSIS — Z951 Presence of aortocoronary bypass graft: Secondary | ICD-10-CM | POA: Diagnosis not present

## 2016-09-08 DIAGNOSIS — I712 Thoracic aortic aneurysm, without rupture, unspecified: Secondary | ICD-10-CM

## 2016-09-08 NOTE — Progress Notes (Signed)
[address]       [place] [address]             [phone]                    Eduardo [name] [place] Medical Record #[id number] Date of Birth: [birth year]  Referring: Herminio Commons, MD Primary Care: [name], MD  Chief Complaint:    Chief Complaint  Patient presents with  . TAA    eval with CTA CHEST 08/24/16    History of Present Illness:    Eduardo [name] 71 y.o. male is seen in the office  today for in follow up for midly dilated aorta noted , studied on a prop CTA of the chest done 05/03/2015.  05/04/2015 He underwent 05/04/2015  OPERATIVE REPORT PREOPERATIVE DIAGNOSIS:  Unstable angina with 3-vessel coronary artery disease. POSTOPERATIVE DIAGNOSIS:  Unstable angina with 3-vessel coronary artery disease. SURGICAL PROCEDURE:  Coronary artery bypass grafting x4 with the left internal mammary to the left anterior descending coronary artery, reverse saphenous vein graft to the 2nd diagonal coronary artery, reverse saphenous vein graft to the first obtuse marginal, reverse saphenous vein graft to the posterior descending coronary artery with bilateral greater saphenous thigh endoscopic vein harvesting   Follow up scan done recently.  Patient has done well following CABG   Current Activity/ Functional Status:  Patient is independent with mobility/ambulation, transfers, ADL's, IADL's.   Zubrod Score: At the time of surgery this patient's most appropriate activity status/level should be described as: []     0    Normal activity, no symptoms [x]     1    Restricted in physical strenuous activity but ambulatory, able to do out light work []     2    Ambulatory and capable of self care, unable to do work activities, up and about               >50 % of waking hours                              []     3    Only limited self care, in bed greater than 50% of waking hours []     4    Completely disabled, no self care, confined to  bed or chair []     5    Moribund   Past Medical History:  Diagnosis Date  . Arthritis   . Bilateral carotid artery disease ([place]) 04/28/2015   1-39 percent bilateral stenosis noted on Doppler   . CAD (coronary artery disease)   . Cervical disc disease   . Chemical exposure    agent orange   . COPD (chronic obstructive pulmonary disease) ([place])   . Diabetes mellitus without complication ([place])   . Diverticulitis   . Hypertension   . Lumbar disc disease   . Obesity (BMI 30-39.9)   . Peripheral neuropathy    in all extremities  . PTSD (post-traumatic stress disorder)   . Sleep apnea   . Thoracic ascending aortic aneurysm [facility])     Past Surgical History:  Procedure Laterality Date  . BACK SURGERY    . CARDIAC CATHETERIZATION    . CARDIAC CATHETERIZATION  03/18/14   difficult to determine culprit vesel  . CARDIAC CATHETERIZATION N/A 04/29/2015   Procedure: Left Heart Cath and Coronary Angiography;  Surgeon: [name], MD;  Location: [place] CV LAB;  Service: Cardiovascular;  Laterality: N/A;  . CARPOMETACARPAL ([place]) FUSION OF THUMB Right 08/04/2016   Procedure: CARPOMETACARPAL [facility]) FUSION OF THUMB;  Surgeon: Iran Planas, MD;  Location: [place];  Service: Orthopedics;  Laterality: Right;  . CERVICAL SPINE SURGERY    . CHOLECYSTECTOMY    . CORONARY ARTERY BYPASS GRAFT N/A 05/04/2015   Procedure: CORONARY ARTERY BYPASS GRAFTING (CABG)  x four,  using left internal mammary artery, and bilateral thigh greater saphenous veins;  Surgeon: [name], MD;  Location: [place];  Service: Open Heart Surgery;  Laterality: N/A;  . EYE SURGERY Right    cataract  . I&D EXTREMITY Left 05/30/2013   Procedure: IRRIGATION AND DEBRIDEMENT Left Hand and Foreign body removal;  Surgeon: [name], MD;  Location: [place];  Service: Orthopedics;  Laterality: Left;  . JOINT REPLACEMENT    . LEFT HEART CATHETERIZATION WITH CORONARY ANGIOGRAM N/A 03/18/2014   Procedure: LEFT HEART  CATHETERIZATION WITH CORONARY ANGIOGRAM;  Surgeon: [name], MD;  Location: [facility] CATH LAB;  Service: Cardiovascular;  Laterality: N/A;  . LUMBAR LAMINECTOMY    . PARTIAL COLECTOMY     For diverticulitis  . PTCA     unsuccesful  . REPLACEMENT TOTAL KNEE     right   . SHOULDER ARTHROSCOPY    . SHOULDER SURGERY Bilateral   . TEE WITHOUT CARDIOVERSION N/A 05/04/2015   Procedure: TRANSESOPHAGEAL ECHOCARDIOGRAM (TEE);  Surgeon: [name], MD;  Location: [place];  Service: Open Heart Surgery;  Laterality: N/A;    Family History  Problem Relation Age of Onset  . Diabetes Father   . Heart disease Father   . Hypertension Father   . Diabetes Paternal Grandmother   . Heart disease Paternal Grandmother   . Hypertension Paternal Grandmother   . Diabetes Paternal Uncle   . Heart disease Paternal Uncle   . Hypertension Paternal Uncle     Social History   Social History  . Marital status: Married    Spouse name: N/A  . Number of children: N/A  . Years of education: N/A   Occupational History  . Not on file.   Social History Main Topics  . Smoking status: Former Smoker    Packs/day: 0.75    Years: 30.00    Types: Cigarettes    Start date: 01/02/1961    Quit date: 05/31/1987  . Smokeless tobacco: Never Used  . Alcohol use No  . Drug use: No  . Sexual activity: Not on file    History  Smoking Status  . Former Smoker  . Packs/day: 0.75  . Years: 30.00  . Types: Cigarettes  . Start date: 01/02/1961  . Quit date: 05/31/1987  Smokeless Tobacco  . Never Used    History  Alcohol Use No     Allergies  Allergen Reactions  . Statins Other (See Comments)    Body aches    Current Outpatient Prescriptions  Medication Sig Dispense Refill  . albuterol (PROVENTIL HFA;VENTOLIN HFA) 108 (90 BASE) MCG/ACT inhaler Inhale 1 puff into the lungs every 6 (six) hours as needed for wheezing or shortness of breath.     [name] aspirin EC 81 MG EC tablet Take 1 tablet (81 mg total) by mouth  daily.    . benazepril (LOTENSIN) 40 MG tablet Take 1 tablet (40 mg total) by mouth daily. 90 tablet 1  . carvedilol (COREG) 12.5 MG tablet Take 1 tablet (12.5 mg total) by mouth  2 (two) times daily. 180 tablet 3  . Cholecalciferol (VITAMIN D PO) Take 6,000 Units by mouth daily.     . clopidogrel (PLAVIX) 75 MG tablet TAKE 1 TABLET DAILY 90 tablet 3  . DULoxetine (CYMBALTA) 20 MG capsule Take 40 mg by mouth daily.     [name] ezetimibe (ZETIA) 10 MG tablet Take 10 mg by mouth at bedtime.     [name] gemfibrozil (LOPID) 600 MG tablet Take 600 mg by mouth 2 (two) times daily before a meal.    . levothyroxine (SYNTHROID, LEVOTHROID) 25 MCG tablet Take 1 tablet (25 mcg total) by mouth daily before breakfast. 30 tablet 1  . metFORMIN (GLUCOPHAGE) 1000 MG tablet Take 1,000 mg by mouth 2 (two) times daily with a meal.    . nitroGLYCERIN (NITROSTAT) 0.4 MG SL tablet Place 0.4 mg under the tongue every 5 (five) minutes as needed for chest pain.    [name] tetrahydrozoline 0.05 % ophthalmic solution Place 1-2 drops into both eyes 3 (three) times daily as needed (for dry/irritated eyes).     No current facility-administered medications for this visit.       Review of Systems:     Cardiac Review of Systems: Y or N  Chest Pain [    ]  Resting SOB [   ] Exertional SOB  [  ]  Orthopnea [  ]   Pedal Edema [   ]    Palpitations [  ] Syncope  [  ]   Presyncope [   ]  General Review of Systems: [Y] = yes [  ]=no Constitional: recent weight change [  ];  Wt loss over the last 3 months [   ] anorexia [  ]; fatigue [  ]; nausea [  ]; night sweats [  ]; fever [  ]; or chills [  ];          Dental: poor dentition[  ]; Last Dentist visit:   Eye : blurred vision [  ]; diplopia [   ]; vision changes [  ];  Amaurosis fugax[  ]; Resp: cough [  ];  wheezing[  ];  hemoptysis[  ]; shortness of breath[  ]; paroxysmal nocturnal dyspnea[  ]; dyspnea on exertion[  ]; or orthopnea[  ];  GI:  gallstones[  ], vomiting[  ];  dysphagia[  ];  melena[  ];  hematochezia [  ]; heartburn[  ];   Hx of  Colonoscopy[  ]; GU: kidney stones [  ]; hematuria[  ];   dysuria [  ];  nocturia[  ];  history of     obstruction [  ]; urinary frequency [  ]             Skin: rash, swelling[  ];, hair loss[  ];  peripheral edema[  ];  or itching[  ]; Musculosketetal: myalgias[  ];  joint swelling[  ];  joint erythema[  ];  joint pain[  ];  back pain[  ];  Heme/Lymph: bruising[  ];  bleeding[  ];  anemia[  ];  Neuro: TIA[  ];  headaches[  ];  stroke[  ];  vertigo[  ];  seizures[  ];   paresthesias[  ];  difficulty walking[  ];  Psych:depression[  ]; anxiety[  ];  Endocrine: diabetes[  ];  thyroid dysfunction[  ];  Immunizations: Flu up to date [  ]; Pneumococcal up to date [  ];  Other:  Physical Exam: BP 132/78 (  BP Location: Right Arm, Patient Position: Sitting, Cuff Size: Large)   Pulse 72   Resp 16   Ht 5\' 8"  (1.727 m)   Wt 184 lb (83.5 kg)   SpO2 94% Comment: ON RA  BMI 27.98 kg/m   PHYSICAL EXAMINATION: General appearance: alert, cooperative, appears older than stated age, no distress and moderately obese Head: Normocephalic, without obvious abnormality, atraumatic Neck: no adenopathy, no carotid bruit, no JVD, supple, symmetrical, trachea midline and thyroid not enlarged, symmetric, no tenderness/mass/nodules Lymph nodes: Cervical, supraclavicular, and axillary nodes normal. Resp: clear to auscultation bilaterally Back: symmetric, no curvature. ROM normal. No CVA tenderness. Cardio: regular rate and rhythm, S1, S2 normal, no murmur, click, rub or gallop GI: soft, non-tender; bowel sounds normal; no masses,  no organomegaly Extremities: extremities normal, atraumatic, no cyanosis or edema and Homans sign is negative, no sign of DVT Neurologic: Grossly normal Sternum is well healed and stable  Diagnostic Studies & Laboratory data:     Recent Radiology Findings:   Ct Angio Chest Aorta W &/or Wo Contrast  Result Date:  08/24/2016 CLINICAL DATA:  Thoracic aortic aneurysm without rupture. EXAM: CT ANGIOGRAPHY CHEST WITH CONTRAST TECHNIQUE: Multidetector CT imaging of the chest was performed using the standard protocol during bolus administration of intravenous contrast. Multiplanar CT image reconstructions and MIPs were obtained to evaluate the vascular anatomy. CONTRAST:  100 mL of Isovue 370 intravenously. COMPARISON:  CT scan of May 03, 2015. FINDINGS: Cardiovascular: Atherosclerosis of thoracic aorta is noted without dissection. Status post coronary artery bypass graft. Great vessels are widely patent. Aneurysmal dilatation of aortic root is noted at 5.1 cm. Ascending thoracic aorta measures 4.2 cm consistent with aneurysm. Transverse aortic arch measures 2.8 cm. Proximal descending thoracic aorta measures 3.4 cm. Mediastinum/Nodes: No enlarged mediastinal, hilar, or axillary lymph nodes. Thyroid gland, trachea, and esophagus demonstrate no significant findings. Lungs/Pleura: No pneumothorax or pleural effusion is noted. Stable 5 mm nodule is seen along left major fissure best seen on image number 33 of series 8. Another stable 5 mm nodule is noted slightly more inferiorly and medially. Given the lack of change, no further follow-up required for these nodules. No acute pulmonary disease is noted. Upper Abdomen: Fatty infiltration of the liver. No other abnormality seen in the visualized portion of the abdomen. Musculoskeletal: No chest wall abnormality. No acute or significant osseous findings. Review of the MIP images confirms the above findings. IMPRESSION: 4.2 cm ascending thoracic aortic aneurysm. Aneurysmal dilatation of aortic root measured at 5.1 cm. Recommend annual imaging followup by CTA or MRA. This recommendation follows 2010 ACCF/AHA/AATS/ACR/ASA/SCA/SCAI/SIR/STS/SVM Guidelines for the Diagnosis and Management of Patients with Thoracic Aortic Disease. Circulation. 2010; 121: H474-Q595. Stable left lung nodules  compared to prior exam. These can be considered benign at this point, with no further follow-up required. Probable fatty infiltration of liver. Electronically Signed   By: Marijo Conception, M.D.   On: 08/24/2016 09:30  Study Result   CLINICAL DATA:  Preoperative for CABG. Reported history of dilated aortic root on outside imaging.  EXAM: CT CHEST WITHOUT CONTRAST  TECHNIQUE: Multidetector CT imaging of the chest was performed following the standard protocol without IV contrast.  COMPARISON:  No prior chest CT.  Chest radiograph from 04/30/2015.  FINDINGS: Mediastinum/Nodes: Normal heart size. No pericardial fluid/thickening. Left anterior descending, left circumflex and right coronary atherosclerosis. There is aneurysmal dilatation of the aortic root and ascending aorta. The aortic root measures approximately 4.9 cm in diameter at the level of the  sinuses of Valsalva. The thoracic aorta measures 4.4 cm in diameter at the sino-tubular junction. The maximum ascending aortic diameter is 4.4 cm. The proximal descending thoracic aorta measures 3.6 cm in diameter. The thoracic aorta measures 2.9 cm diameter at the diaphragmatic hiatus. Normal caliber pulmonary arteries. Normal visualized thyroid. Normal esophagus. No pathologically enlarged axillary, mediastinal or gross hilar lymph nodes, noting limited sensitivity for the detection of hilar adenopathy on this noncontrast study.  Lungs/Pleura: No pneumothorax. No pleural effusion. Mild centrilobular and paraseptal emphysema and mild diffuse bronchial wall thickening. Subpleural 4 mm anterior left lower lobe pulmonary nodule associated with the left major fissure (series 3/ image 33). No acute consolidative airspace disease, additional significant pulmonary nodules or lung masses.  Upper abdomen: Diffuse hepatic steatosis.  Musculoskeletal: No aggressive appearing focal osseous lesions. Mild degenerative changes in the  thoracic spine.  IMPRESSION: 1. Aneurysmal dilatation of the aortic root (4.9 cm diameter) and ascending aorta (4.4 cm diameter). Ascending thoracic aortic aneurysm. Recommend semi-annual imaging followup by CTA or MRA. This recommendation follows 2010 ACCF/AHA/AATS/ACR/ASA/SCA/SCAI/SIR/STS/SVM Guidelines for the Diagnosis and Management of Patients With Thoracic Aortic Disease. Circulation. 2010; 121: W263-Z858 2. Three-vessel coronary atherosclerosis. 3. Subpleural left lower lobe 4 mm pulmonary nodule, probably benign subpleural lymph node. A follow-up chest CT is advised in 12 months. This recommendation follows the consensus statement: Guidelines for Management of Small Pulmonary Nodules Detected on CT Scans: A Statement from the [employer] as published in Radiology 2005; 237:395-400. 4. Mild centrilobular and paraseptal emphysema and mild diffuse bronchial wall thickening, which suggests COPD.   Electronically Signed   By: Ilona Sorrel M.D.   On: 05/03/2015 11:03      I have independently reviewed the above radiologic studies.  Recent Lab Findings: Lab Results  Component Value Date   WBC 5.3 07/29/2016   HGB 15.6 07/29/2016   HCT 45.8 07/29/2016   PLT 240 07/29/2016   GLUCOSE 178 (H) 07/29/2016   CHOL 130 07/16/2016   TRIG 308 (H) 07/16/2016   HDL 25 (L) 07/16/2016   LDLCALC 43 07/16/2016   ALT 26 07/15/2016   AST 24 07/15/2016   NA 138 07/29/2016   K 4.4 07/29/2016   CL 104 07/29/2016   CREATININE 0.78 07/29/2016   BUN 24 (H) 07/29/2016   CO2 22 07/29/2016   TSH 2.100 03/16/2016   INR 1.01 07/29/2016   HGBA1C 6.7 (H) 07/16/2016   Most recent echo is theTEE done at time of surgery 04/2015 LV EF: 55% -   60%  ------------------------------------------------------------------- Study Conclusions  - Left ventricle: Systolic function was normal. The estimated   ejection fraction was in the range of 55% to 60%. Wall motion was   normal; there  were no regional wall motion abnormalities. - Aortic valve: There was mild regurgitation, with a single jet   originating from the central coaptation point. - Mitral valve: No evidence of vegetation. - Left atrium: No evidence of thrombus in the atrial cavity or   appendage. - Atrial septum: No defect or patent foramen ovale was identified. - Tricuspid valve: No evidence of vegetation.  Intraoperative transesophageal echocardiography.  Birthdate: Patient birthdate: [birth year].  Age:  Patient is 71 yr old.  Sex: Gender: male.  Study date:  Study date: 05/04/2015. Study time: 08:03 AM.  -------------------------------------------------------------------  ------------------------------------------------------------------- Left ventricle:  Systolic function was normal. The estimated ejection fraction was in the range of 55% to 60%. Wall motion was normal; there were no regional wall motion abnormalities.  -------------------------------------------------------------------  Aortic valve:   Trileaflet.  Doppler:  There was mild regurgitation, with a single jet originating from the central coaptation point.  ------------------------------------------------------------------- Aorta:  Aortic root: The aortic root was mildly dilated.  ------------------------------------------------------------------- Mitral valve:   Structurally normal valve.   Leaflet separation was normal.  No evidence of vegetation.  Doppler:  There was no regurgitation.  ------------------------------------------------------------------- Left atrium:  The atrium was normal in size.  No evidence of thrombus in the atrial cavity or appendage.  ------------------------------------------------------------------- Atrial septum:  No defect or patent foramen ovale was identified.   ------------------------------------------------------------------- Right ventricle:  The cavity size was normal. Wall thickness  was normal. Systolic function was normal.  ------------------------------------------------------------------- Pulmonic valve:   Poorly visualized.  ------------------------------------------------------------------- Tricuspid valve:   Structurally normal valve.   Leaflet separation was normal.  No evidence of vegetation.  Doppler:  There was no regurgitation.  ------------------------------------------------------------------- Pericardium:  The pericardium was normal in appearance. There was no pericardial effusion.  ------------------------------------------------------------------- Pre bypass:  Post bypass: LV global systolic function was unchanged from the prior stage. The estimated LV ejection fraction was 55%. Aortic valve:  Mild regurgitation, with a centrally directed regurgitant jet. Pre bypass: Post bypass:  ------------------------------------------------------------------- Prepared and Electronically Authenticated by  Suzette Battiest, M.D. 2016-12-14T16:58:28  Assessment / Plan:   Patient with known CAD, s/p CABG, Dilated Ascending aorta little changed from 04/2015 - plan follow up cta one year Patient cautioned about heavy staining/valsalva  And need for good BP management       I  spent 30 minutes counseling the patient face to face and 50% or more the  time was spent in counseling and coordination of care. The total time spent in the appointment was  45 minutes.  [name] MD      [place] [place] [address] Office [phone]   Beeper [phone]  09/12/2016 9:24 PM

## 2016-09-08 NOTE — Patient Instructions (Signed)
Avoid Cipro and similar drugs  It's best to avoid activities that cause grunting or straining (medically referred to as a "valsalva maneuver"). This happens when a person bears down against a closed throat to increase the strength of arm or abdominal muscles. There's often a tendency to do this when lifting heavy weights, doing sit-ups, push-ups or chin-ups, etc., but it may be harmful.   Thoracic Aortic Aneurysm An aneurysm is a bulge in an artery. It happens when blood pushes up against a weakened or damaged artery wall. A thoracic aortic aneurysm is an aneurysm that occurs in the first part of the aorta, between the heart and the diaphragm. The aorta is the main artery of the body. It supplies blood from the heart to the rest of the body. Some aneurysms may not cause symptoms or problems. However, the major concern with a thoracic aortic aneurysm is that it can enlarge and burst (rupture), or blood can flow between the layers of the wall of the aorta through a tear (aorticdissection). Both of these conditions can cause bleeding inside the body and can be life-threatening if they are not diagnosed and treated right away. What are the causes? The exact cause of this condition is not known. What increases the risk? The following factors may make you more likely to develop this condition:  Being age 54 or older.  Having a hardening of the arteries caused by the buildup of fat and other substances in the lining of a blood vessel (arteriosclerosis).  Having inflammation of the walls of an artery (arteritis).  Having a genetic disease that weakens the body's connective tissue, such as Marfan syndrome.  Having an injury or trauma to the aorta.  Having an infection that is caused by bacteria, such as syphilis or staphylococcus, in the wall of the aorta (infectious aortitis).  Having high blood pressure (hypertension).  Being male.  Being white (Caucasian).  Having high cholesterol.  Having  a family history of aneurysms.  Using tobacco.  Having chronic obstructive pulmonary disease (COPD). What are the signs or symptoms? Symptoms of this condition vary depending on the size and rate of growth of the aneurysm. Most grow slowly and do not cause any symptoms. When symptoms do occur, they may include:  Pain in the chest, back, sides, or abdomen. The pain may vary in intensity. A sudden onset of severe pain may indicate that the aneurysm has ruptured.  Hoarseness.  Cough.  Shortness of breath.  Swallowing problems.  Swelling in the face, arms, or legs.  Fever.  Unexplained weight loss. How is this diagnosed? This condition may be diagnosed with:  An ultrasound.  X-rays.  A CT scan.  An MRI.  Tests to check the arteries for damage or blockages (angiogram). Most unruptured thoracic aortic aneurysms cause no symptoms, so they are often found during exams for other conditions. How is this treated? Treatment for this condition depends on:  The size of the aneurysm.  How fast the aneurysm is growing.  Your age.  Risk factors for rupture. Aneurysms that are smaller than 2.2 inches (5.5 cm) may be managed by using medicines to control blood pressure, manage pain, or fight infection. You may need regular monitoring to see if the aneurysm is getting bigger. Your health care provider may recommend that you have an ultrasound every year or every 6 months. How often you need to have an ultrasound depends on the size of the aneurysm, how fast it is growing, and whether you have  a family history of aneurysms. Surgical repair may be needed if your aneurysm is larger than 2.2 inches or if it is growing quickly. Follow these instructions at home: Eating and drinking   Eat a healthy diet. Your health care provider may recommend that you:  Lower your salt (sodium) intake. In some people, too much salt can raise blood pressure and increase the risk of thoracic aortic  aneurysm.  Avoid foods that are high in saturated fat and cholesterol, such as red meat and dairy.  Eat a diet that is low in sugar.  Increase your fiber intake by including whole grains, vegetables, and fruits in your diet. Eating these foods may help to lower blood pressure.  Limit or avoid alcohol as recommended by your health care provider. Lifestyle   Follow instructions from your health care provider about healthy lifestyle habits. Your health care provider may recommend that you:  Do not use any products that contain nicotine or tobacco, such as cigarettes and e-cigarettes. If you need help quitting, ask your health care provider.  Keep your blood pressure within normal limits. The target limit for most people is below 120/80. Check your blood pressure regularly. If it is high, ask your health care provider about ways that you can control it.  Keep your blood sugar (glucose) level and cholesterol levels within normal limits. Target limits for most people are:  Blood glucose level: Less than 100 mg/dL.  Total cholesterol level: Less than 200 mg/dL.  Maintain a healthy weight. Activity   Stay physically active and exercise regularly. Talk with your health care provider about how often you should exercise and ask which types of exercise are safe for you.  Avoid heavy lifting and activities that take a lot of effort (are strenuous). Ask your health care provider what activities are safe for you. General instructions   Keep all follow-up visits as told by your health care provider. This is important.  Talk with your health care provider about regular screenings to see if the aneurysm is getting bigger.  Take over-the-counter and prescription medicines only as told by your health care provider. Contact a health care provider if:  You have discomfort in your upper back, neck, or abdomen.  You have trouble swallowing.  You have a cough or hoarseness.  You have a family  history of aneurysms.  You have unexplained weight loss. Get help right away if:  You have sudden, severe pain in your upper back and abdomen. This pain may move into your chest and arms.  You have shortness of breath.  You have a fever. This information is not intended to replace advice given to you by your health care provider. Make sure you discuss any questions you have with your health care provider. Document Released: 05/16/2005 Document Revised: 02/26/2016 Document Reviewed: 02/26/2016 Elsevier Interactive Patient Education  2017 [employer].

## 2016-09-12 ENCOUNTER — Encounter: Payer: Self-pay | Admitting: *Deleted

## 2016-09-12 NOTE — Progress Notes (Signed)
RE: cardiothoracic referral  Received: Today  Message Contents  [place], LPN        Mr. Granja Cancelled his visit with surgeon  Due to having shoulder surgery.   .   Previous Messages    ----- Message -----  From: Laurine Blazer, LPN  Sent: 1/61/0960  4:34 PM  To: Chanda Busing  Subject: cardiothoracic referral              FYI - referral put in for ascending thoracic aneurysm - patient aware.    Thanks   [employer]

## 2016-09-15 ENCOUNTER — Encounter: Payer: TRICARE For Life (TFL) | Admitting: Surgery

## 2016-09-19 DIAGNOSIS — Z4789 Encounter for other orthopedic aftercare: Secondary | ICD-10-CM | POA: Diagnosis not present

## 2016-09-19 DIAGNOSIS — M1811 Unilateral primary osteoarthritis of first carpometacarpal joint, right hand: Secondary | ICD-10-CM | POA: Diagnosis not present

## 2016-09-20 DIAGNOSIS — M24111 Other articular cartilage disorders, right shoulder: Secondary | ICD-10-CM | POA: Diagnosis not present

## 2016-09-20 DIAGNOSIS — M7541 Impingement syndrome of right shoulder: Secondary | ICD-10-CM | POA: Diagnosis not present

## 2016-09-20 DIAGNOSIS — M19011 Primary osteoarthritis, right shoulder: Secondary | ICD-10-CM | POA: Diagnosis not present

## 2016-09-20 DIAGNOSIS — G8918 Other acute postprocedural pain: Secondary | ICD-10-CM | POA: Diagnosis not present

## 2016-09-20 DIAGNOSIS — M7521 Bicipital tendinitis, right shoulder: Secondary | ICD-10-CM | POA: Diagnosis not present

## 2016-09-20 DIAGNOSIS — M75121 Complete rotator cuff tear or rupture of right shoulder, not specified as traumatic: Secondary | ICD-10-CM | POA: Diagnosis not present

## 2016-09-21 ENCOUNTER — Encounter: Payer: TRICARE For Life (TFL) | Admitting: Surgery

## 2016-09-29 DIAGNOSIS — Z4789 Encounter for other orthopedic aftercare: Secondary | ICD-10-CM | POA: Diagnosis not present

## 2016-10-11 DIAGNOSIS — M1812 Unilateral primary osteoarthritis of first carpometacarpal joint, left hand: Secondary | ICD-10-CM | POA: Diagnosis not present

## 2016-10-11 DIAGNOSIS — Z4789 Encounter for other orthopedic aftercare: Secondary | ICD-10-CM | POA: Diagnosis not present

## 2016-10-11 DIAGNOSIS — M1811 Unilateral primary osteoarthritis of first carpometacarpal joint, right hand: Secondary | ICD-10-CM | POA: Diagnosis not present

## 2016-10-28 NOTE — Addendum Note (Signed)
Addendum  created 10/28/16 1103 by Effie Berkshire, MD   Sign clinical note

## 2016-11-02 DIAGNOSIS — Z4789 Encounter for other orthopedic aftercare: Secondary | ICD-10-CM | POA: Diagnosis not present

## 2016-11-10 ENCOUNTER — Ambulatory Visit: Payer: Medicare Other | Attending: Orthopedic Surgery | Admitting: Physical Therapy

## 2016-11-10 DIAGNOSIS — M25511 Pain in right shoulder: Secondary | ICD-10-CM | POA: Diagnosis not present

## 2016-11-10 NOTE — Therapy (Signed)
[facility] [place], [place], [address] Phone: [phone]   Fax:  [phone]  Physical Therapy Evaluation  Patient Details  Name: Eduardo [name] MRN: [id number] Date of Birth: 1946-02-11 Referring Provider: Jenetta Loges PA-C.  Encounter Date: 11/10/2016      PT End of Session - 11/10/16 0954    Visit Number 1   Number of Visits 12   Date for PT Re-Evaluation 12/08/16   PT Start Time 0908   PT Stop Time 0948   PT Time Calculation (min) 40 min   Activity Tolerance Patient tolerated treatment well   Behavior During Therapy [facility] for tasks assessed/performed      Past Medical History:  Diagnosis Date  . Arthritis   . Bilateral carotid artery disease ([place]) 04/28/2015   1-39 percent bilateral stenosis noted on Doppler   . CAD (coronary artery disease)   . Cervical disc disease   . Chemical exposure    agent orange   . COPD (chronic obstructive pulmonary disease) ([place])   . Diabetes mellitus without complication ([place])   . Diverticulitis   . Hypertension   . Lumbar disc disease   . Obesity (BMI 30-39.9)   . Peripheral neuropathy    in all extremities  . PTSD (post-traumatic stress disorder)   . Sleep apnea   . Thoracic ascending aortic aneurysm [facility])     Past Surgical History:  Procedure Laterality Date  . BACK SURGERY    . CARDIAC CATHETERIZATION    . CARDIAC CATHETERIZATION  03/18/14   difficult to determine culprit vesel  . CARDIAC CATHETERIZATION N/A 04/29/2015   Procedure: Left Heart Cath and Coronary Angiography;  Surgeon: [name], MD;  Location: [place] CV LAB;  Service: Cardiovascular;  Laterality: N/A;  . CARPOMETACARPAL ([place]) FUSION OF THUMB Right 08/04/2016   Procedure: CARPOMETACARPAL [facility]) FUSION OF THUMB;  Surgeon: Iran Planas, MD;  Location: [place];  Service: Orthopedics;  Laterality: Right;  . CERVICAL SPINE SURGERY    . CHOLECYSTECTOMY    . CORONARY ARTERY BYPASS GRAFT  N/A 05/04/2015   Procedure: CORONARY ARTERY BYPASS GRAFTING (CABG)  x four,  using left internal mammary artery, and bilateral thigh greater saphenous veins;  Surgeon: [name], MD;  Location: [place];  Service: Open Heart Surgery;  Laterality: N/A;  . EYE SURGERY Right    cataract  . I&D EXTREMITY Left 05/30/2013   Procedure: IRRIGATION AND DEBRIDEMENT Left Hand and Foreign body removal;  Surgeon: [name], MD;  Location: [place];  Service: Orthopedics;  Laterality: Left;  . JOINT REPLACEMENT    . LEFT HEART CATHETERIZATION WITH CORONARY ANGIOGRAM N/A 03/18/2014   Procedure: LEFT HEART CATHETERIZATION WITH CORONARY ANGIOGRAM;  Surgeon: [name], MD;  Location: [facility] CATH LAB;  Service: Cardiovascular;  Laterality: N/A;  . LUMBAR LAMINECTOMY    . PARTIAL COLECTOMY     For diverticulitis  . PTCA     unsuccesful  . REPLACEMENT TOTAL KNEE     right   . SHOULDER ARTHROSCOPY    . SHOULDER SURGERY Bilateral   . TEE WITHOUT CARDIOVERSION N/A 05/04/2015   Procedure: TRANSESOPHAGEAL ECHOCARDIOGRAM (TEE);  Surgeon: [name], MD;  Location: [place];  Service: Open Heart Surgery;  Laterality: N/A;    There were no vitals filed for this visit.       Subjective Assessment - 11/10/16 1002    Subjective The patient underwent a left shoulder surgery performed arthroscopically on 09/20/16.  He states he is doing home exercises and has been driving golf balls 61-44 yards though he knows he should not be doing so.  His pain-level is a 4/10 today. Pain increases with movement and decreases with rest and ice.            [facility] PT Assessment - 11/10/16 0001      Assessment   Medical Diagnosis Right shoulder arthroscopic RTC repair.   Referring Provider Jenetta Loges PA-C.     Precautions   Precautions --  Follow standard RTC protocol from [place].     Restrictions   Weight Bearing Restrictions No     Balance Screen   Has the patient fallen in the past 6 months No    Has the patient had a decrease in activity level because of a fear of falling?  No   Is the patient reluctant to leave their home because of a fear of falling?  No     Home Environment   Living Environment Private residence     Prior Function   Level of Independence Independent     Observation/Other Assessments   Observations Right anterior shoulder incision is fully healed.     Posture/Postural Control   Posture/Postural Control No significant limitations     ROM / Strength   AROM / PROM / Strength AROM     AROM   Overall AROM Comments In supine:  Right shoulder flexion= 155 degrees; ER= 75 degrees and IR= 70 degrees.     Palpation   Palpation comment Tender to palpation over right bicipital groove.     Ambulation/Gait   Gait Comments WNL.            Objective measurements completed on examination: See above findings.          [facility] Adult PT Treatment/Exercise - 11/10/16 0001      Modalities   Modalities Cryotherapy;Electrical Stimulation     Moist Heat Therapy   Number Minutes Moist Heat 15 Minutes   Moist Heat Location --  Right shoulder.     Acupuncturist Location --  Right shoulder...anterior.   Electrical Stimulation Action Pre-mod non-motoric    Electrical Stimulation Parameters 80-150 Hz x 15 minutes.   Electrical Stimulation Goals Pain                  PT Short Term Goals - 11/10/16 1021      PT SHORT TERM GOAL #1   Title STG's=LTG's.           PT Long Term Goals - 11/10/16 1021      PT LONG TERM GOAL #1   Title Ind with advanced HEP.   Time 8   Period Weeks   Status New     PT LONG TERM GOAL #2   Title Active antigravity right shoulder flexion to 155 degrees so the patient can easily reach overhead   Time 8   Period Weeks   Status New     PT LONG TERM GOAL #3   Title Perform ADL's with pain not > 3/10.   Time 8   Period Weeks   Status New     PT LONG TERM GOAL #4   Title  Active right shoulder ER to 85 degrees to allow for easily donning/doffing of apparel.   Time 8   Period Weeks   Status New     PT LONG TERM GOAL #5   Title Increase right shoulder  strength to a solid 4+/5 to increase stability for performance of functional activities   Time 8   Period Weeks   Status New                Plan - 11/10/16 1015    Clinical Impression Statement The patient presents to OPPT s/p right shoulder arthroscopic surgery performed on 09/20/16.  He is tender to palpation over his right bicipital groove.  He has been doing home exercises and has done an excellent job of restoring his ROM.  He would like to get back to golf as he was pre-surgery.  Plan to progress patient per [facility] standard rotator cuff repai protocol.  Patient is expected to do an excellent job with skilled physical therapy intervention.   History and Personal Factors relevant to plan of care: Previous shoulder surgery.     Clinical Presentation Stable   Clinical Decision Making Low   Rehab Potential Excellent   PT Frequency 3x / week   PT Duration 4 weeks   PT Treatment/Interventions ADLs/Self Care Home Management;Cryotherapy;Electrical Stimulation;Moist Heat;Ultrasound;Patient/family education;Therapeutic exercise;Therapeutic activities;Passive range of motion   PT Next Visit Plan PROM to right shoulder to restore full ROM; Modalites PRN.  Progress per standard RTC repair protocol ([place] [facility]).      Patient will benefit from skilled therapeutic intervention in order to improve the following deficits and impairments:  Decreased range of motion, Decreased activity tolerance, Pain  Visit Diagnosis: Acute pain of right shoulder - Plan: PT plan of care cert/re-cert      G-Codes - 41/96/22 1011    Functional Assessment Tool Used (Outpatient Only) FOTO...35% limitation.   Functional Limitation Carrying, moving and handling objects   Mobility: Walking and Moving Around Current  Status [phone]) At least 20 percent but less than 40 percent impaired, limited or restricted   Carrying, Moving and Handling Objects Current Status (X2119) At least 20 percent but less than 40 percent impaired, limited or restricted   Carrying, Moving and Handling Objects Goal Status (E1740) At least 1 percent but less than 20 percent impaired, limited or restricted       Problem List Patient Active Problem List   Diagnosis Date Noted  . Expressive aphasia 07/15/2016  . Chest pain 09/26/2015  . Elevated TSH 05/08/2015  . S/P CABG x 4   . Bilateral carotid artery disease ([place]) 04/28/2015  . BMI 28.0-28.9,adult   . Unstable angina ([place]) 03/18/2014  . Type 2 diabetes mellitus without complication, without long-term current use of insulin ([place]) 09/21/2013  . COPD (chronic obstructive pulmonary disease) ([place]) 09/21/2013  . PTSD (post-traumatic stress disorder) 09/21/2013  . CAD (coronary artery disease) 04/16/2013  . HTN (hypertension) 04/16/2013  . Hyperlipidemia 04/16/2013    Jolaine Fryberger, [place] 11/10/2016, 10:29 AM  [facility] [address] [place], [place], [address] Phone: [phone]   Fax:  [phone]  Name: Eduardo [name] MRN: [id number] Date of Birth: 09/23/1945

## 2016-11-16 DIAGNOSIS — E860 Dehydration: Secondary | ICD-10-CM | POA: Diagnosis not present

## 2016-11-16 DIAGNOSIS — E875 Hyperkalemia: Secondary | ICD-10-CM | POA: Diagnosis not present

## 2016-11-18 ENCOUNTER — Encounter: Payer: Self-pay | Admitting: Pediatrics

## 2016-11-18 ENCOUNTER — Ambulatory Visit (INDEPENDENT_AMBULATORY_CARE_PROVIDER_SITE_OTHER): Payer: Medicare Other | Admitting: Pediatrics

## 2016-11-18 VITALS — BP 126/82 | HR 62 | Temp 97.2°F | Ht 68.0 in | Wt 190.4 lb

## 2016-11-18 DIAGNOSIS — I10 Essential (primary) hypertension: Secondary | ICD-10-CM | POA: Diagnosis not present

## 2016-11-18 DIAGNOSIS — E875 Hyperkalemia: Secondary | ICD-10-CM | POA: Diagnosis not present

## 2016-11-18 DIAGNOSIS — R531 Weakness: Secondary | ICD-10-CM

## 2016-11-18 DIAGNOSIS — E119 Type 2 diabetes mellitus without complications: Secondary | ICD-10-CM

## 2016-11-18 NOTE — Progress Notes (Addendum)
Subjective:   Patient ID: Eduardo [name], male    DOB: 05/27/1946, 71 y.o.   MRN: [id number] CC: Hospitalization Follow-up (Elevated potassium)  HPI: Eduardo [name] is a 71 y.o. male presenting for Hospitalization Follow-up (Elevated potassium)  [facility] Heard 4 days after that (2 days ago) that his K was elevated and was told to change his diet. A day after labs drawn he started feeling weak, tired after spending hours shoveling mulch in the sun with minimal water intake per pt and his wife.  Has continued to feel bad since then More tired than usual Wants to lay around Minimal energy No appetite Not nauseous but doesn't want food on his stomach  Was in [place] for past 4 days, returned this morning. After call about labs 2 days ago he was feeling bad enough that he went to ED in [place].  Records not available, have called multiple times to try to get records, pt has signed release form. There he was told again he had high K, needs to get in to see his doctor today He was taken off of ACE-I, doubled carvedilol for BP control Drove back from [place] this morning, wife did most of the driving Says he still feels bad, tired, no appetite, weak   Feels like he is having "muscle cramps" in his chest that dont hurt, last for seconds, has happened twice today  Does not have chest pressure, SOB, no vomiting, no arm pain or tingling, no jaw pain Has had CABG before, on plavix, ASA  Has had loose stools starting yesterday morning Has gone 5-6 times yesterday and today Ran out of duloxetine for 2 days starting 2 days ago Feels bad when he runs out of medication  No fever or chills No abd pain or dysuria No cough, URI symptoms  Relevant past medical, surgical, family and social history reviewed. Allergies and medications reviewed and updated. History  Smoking Status  . Former Smoker  . Packs/day: 0.75  . Years: 30.00  . Types: Cigarettes  . Start date:  01/02/1961  . Quit date: 05/31/1987  Smokeless Tobacco  . Never Used   ROS: Per HPI   Objective:    BP 126/82   Pulse 62   Temp 97.2 F (36.2 C) (Oral)   Ht 5' 8" (1.727 m)   Wt 190 lb 6.4 oz (86.4 kg)   BMI 28.95 kg/m   Wt Readings from Last 3 Encounters:  11/18/16 190 lb 6.4 oz (86.4 kg)  09/08/16 184 lb (83.5 kg)  08/04/16 189 lb (85.7 kg)    Gen: NAD, alert, cooperative with exam, NCAT EYES: EOMI, no conjunctival injection, or no icterus ENT:   OP without erythema LYMPH: no cervical LAD CV: NRRR, normal S1/S2, no murmur, distal pulses 2+ b/l Resp: CTABL, no wheezes, normal WOB Abd: +BS, soft, mildly tender throughout with gentle palpation, no guarding or rebound Ext: No edema, warm Neuro: Alert and oriented, strength equal b/l UE and LE, coordination grossly normal MSK: normal muscle bulk  EKG: HR 60, nsr, 1st degree AV block, poor Rwave progression, no twave inversions, similar to 06/2016 EKG  Assessment & Plan:  Eduardo [name] was seen today for ED follow-up, weakness.  Diagnoses and all orders for this visit:  Serum potassium elevated No EKG changes Has had loose stools for 24 hours Off of ACE-I Recheck K, will give kayexelate if needed -     EKG 12-Lead  Weakness Unclear cause   Check labs Restart duloxetine Rest break every hour if working in the sun Drink plenty of water Any worsening in symptoms needs to be seen in ED -     CBC with Differential -     CMP14+EGFR  Type 2 diabetes mellitus without complication, without long-term current use of insulin (HCC) Cont metformin  Essential hypertension Had stopped ACE-I yesterday, doubled carvedilol BP normal today Check BP before taking carvedilol Take half tab if < 120, if over take usual prescribed dose 12.46m BID  Follow up plan: 2 weeks, sooner if needed [place] Spoke w pt Elevated K, anion gap. Pt off of ACE-I now, added allergy list for hyper  K EKG without changes yesterday Says feeling "normal" today, first time in several days Ate breakfast, no nausea Sent in kayexelate x 1 dose Repeat labs on Monday, order in Stop metformin with acidosis until labs normal.

## 2016-11-18 NOTE — Patient Instructions (Signed)
Take carvedilol 12.5mg  BID Stop the benazepril

## 2016-11-19 ENCOUNTER — Other Ambulatory Visit: Payer: Self-pay | Admitting: Pediatrics

## 2016-11-19 DIAGNOSIS — E875 Hyperkalemia: Secondary | ICD-10-CM

## 2016-11-19 MED ORDER — SODIUM POLYSTYRENE SULFONATE 15 GM/60ML PO SUSP: 15.0000 g | Freq: Once | ORAL | 0 refills | Status: AC

## 2016-11-21 ENCOUNTER — Ambulatory Visit: Payer: Medicare Other | Admitting: Physical Therapy

## 2016-11-21 ENCOUNTER — Encounter: Payer: Self-pay | Admitting: Physical Therapy

## 2016-11-21 ENCOUNTER — Other Ambulatory Visit: Payer: Medicare Other

## 2016-11-21 DIAGNOSIS — E875 Hyperkalemia: Secondary | ICD-10-CM | POA: Diagnosis not present

## 2016-11-21 DIAGNOSIS — M25511 Pain in right shoulder: Secondary | ICD-10-CM

## 2016-11-21 LAB — CBC WITH DIFFERENTIAL/PLATELET
BASOS ABS: 0.1 10*3/uL (ref 0.0–0.2)
Basos: 1 %
EOS (ABSOLUTE): 0 10*3/uL (ref 0.0–0.4)
Eos: 0 %
HEMOGLOBIN: 11.1 g/dL — AB (ref 13.0–17.7)
Hematocrit: 33.5 % — ABNORMAL LOW (ref 37.5–51.0)
IMMATURE GRANULOCYTES: 3 %
Immature Grans (Abs): 0.2 10*3/uL — ABNORMAL HIGH (ref 0.0–0.1)
LYMPHS: 39 %
Lymphocytes Absolute: 2.2 10*3/uL (ref 0.7–3.1)
MCH: 30.4 pg (ref 26.6–33.0)
MCHC: 33.1 g/dL (ref 31.5–35.7)
MCV: 92 fL (ref 79–97)
MONOCYTES: 16 %
Monocytes Absolute: 0.9 10*3/uL (ref 0.1–0.9)
NEUTROS ABS: 2.3 10*3/uL (ref 1.4–7.0)
NEUTROS PCT: 41 %
PLATELETS: 204 10*3/uL (ref 150–379)
RBC: 3.65 x10E6/uL — AB (ref 4.14–5.80)
RDW: 14.2 % (ref 12.3–15.4)
WBC: 5.6 10*3/uL (ref 3.4–10.8)

## 2016-11-21 LAB — CMP14+EGFR
ALBUMIN: 4.1 g/dL (ref 3.5–4.8)
ALT: 14 IU/L (ref 0–44)
AST: 22 IU/L (ref 0–40)
Albumin/Globulin Ratio: 1.8 (ref 1.2–2.2)
Alkaline Phosphatase: 60 IU/L (ref 39–117)
BILIRUBIN TOTAL: 0.4 mg/dL (ref 0.0–1.2)
BUN / CREAT RATIO: 38 — AB (ref 10–24)
BUN: 39 mg/dL — AB (ref 8–27)
CALCIUM: 8.4 mg/dL — AB (ref 8.6–10.2)
CHLORIDE: 109 mmol/L — AB (ref 96–106)
CO2: 18 mmol/L — AB (ref 20–29)
CREATININE: 1.02 mg/dL (ref 0.76–1.27)
GFR, EST AFRICAN AMERICAN: 85 mL/min/{1.73_m2} (ref >59)
GFR, EST NON AFRICAN AMERICAN: 74 mL/min/{1.73_m2} (ref >59)
Globulin, Total: 2.3 g/dL (ref 1.5–4.5)
Glucose: 86 mg/dL (ref 65–99)
Potassium: 5.9 mmol/L (ref 3.5–5.2)
Sodium: 142 mmol/L (ref 134–144)
TOTAL PROTEIN: 6.4 g/dL (ref 6.0–8.5)

## 2016-11-21 NOTE — Therapy (Signed)
[facility] [place], [place], [address] Phone: [phone]   Fax:  [phone]  Physical Therapy Treatment  Patient Details  Name: Eduardo [name] MRN: [id number] Date of Birth: 1945-12-08 Referring Provider: Jenetta Loges PA-C.  Encounter Date: 11/21/2016      PT End of Session - 11/21/16 0840    Visit Number 2   Number of Visits 12   Date for PT Re-Evaluation 12/08/16   PT Start Time 0815   PT Stop Time 0901   PT Time Calculation (min) 46 min   Activity Tolerance Patient tolerated treatment well   Behavior During Therapy [facility] for tasks assessed/performed      Past Medical History:  Diagnosis Date  . Arthritis   . Bilateral carotid artery disease ([place]) 04/28/2015   1-39 percent bilateral stenosis noted on Doppler   . CAD (coronary artery disease)   . Cervical disc disease   . Chemical exposure    agent orange   . COPD (chronic obstructive pulmonary disease) ([place])   . Diabetes mellitus without complication ([place])   . Diverticulitis   . Hypertension   . Lumbar disc disease   . Obesity (BMI 30-39.9)   . Peripheral neuropathy    in all extremities  . PTSD (post-traumatic stress disorder)   . Sleep apnea   . Thoracic ascending aortic aneurysm [facility])     Past Surgical History:  Procedure Laterality Date  . BACK SURGERY    . CARDIAC CATHETERIZATION    . CARDIAC CATHETERIZATION  03/18/14   difficult to determine culprit vesel  . CARDIAC CATHETERIZATION N/A 04/29/2015   Procedure: Left Heart Cath and Coronary Angiography;  Surgeon: [name], MD;  Location: [place] CV LAB;  Service: Cardiovascular;  Laterality: N/A;  . CARPOMETACARPAL ([place]) FUSION OF THUMB Right 08/04/2016   Procedure: CARPOMETACARPAL [facility]) FUSION OF THUMB;  Surgeon: Iran Planas, MD;  Location: [place];  Service: Orthopedics;  Laterality: Right;  . CERVICAL SPINE SURGERY    . CHOLECYSTECTOMY    . CORONARY ARTERY BYPASS GRAFT  N/A 05/04/2015   Procedure: CORONARY ARTERY BYPASS GRAFTING (CABG)  x four,  using left internal mammary artery, and bilateral thigh greater saphenous veins;  Surgeon: [name], MD;  Location: [place];  Service: Open Heart Surgery;  Laterality: N/A;  . EYE SURGERY Right    cataract  . I&D EXTREMITY Left 05/30/2013   Procedure: IRRIGATION AND DEBRIDEMENT Left Hand and Foreign body removal;  Surgeon: [name], MD;  Location: [place];  Service: Orthopedics;  Laterality: Left;  . JOINT REPLACEMENT    . LEFT HEART CATHETERIZATION WITH CORONARY ANGIOGRAM N/A 03/18/2014   Procedure: LEFT HEART CATHETERIZATION WITH CORONARY ANGIOGRAM;  Surgeon: [name], MD;  Location: [facility] CATH LAB;  Service: Cardiovascular;  Laterality: N/A;  . LUMBAR LAMINECTOMY    . PARTIAL COLECTOMY     For diverticulitis  . PTCA     unsuccesful  . REPLACEMENT TOTAL KNEE     right   . SHOULDER ARTHROSCOPY    . SHOULDER SURGERY Bilateral   . TEE WITHOUT CARDIOVERSION N/A 05/04/2015   Procedure: TRANSESOPHAGEAL ECHOCARDIOGRAM (TEE);  Surgeon: [name], MD;  Location: [place];  Service: Open Heart Surgery;  Laterality: N/A;    There were no vitals filed for this visit.      Subjective Assessment - 11/21/16 0818    Subjective Patient reported no pain today. Patient reported doing lifting and exercises with bands  at home.   Pertinent History Previous shoulder surgeries.  Cervical surgery.   Patient Stated Goals Golf.   Currently in Pain? No/denies            [facility] PT Assessment - 11/21/16 0001      ROM / Strength   AROM / PROM / Strength PROM     PROM   PROM Assessment Site Shoulder   Right/Left Shoulder Right   Right Shoulder Flexion 155 Degrees   Right Shoulder External Rotation 85 Degrees                     [facility] Adult PT Treatment/Exercise - 11/21/16 0001      Exercises   Exercises Shoulder     Shoulder Exercises: Supine   Other Supine Exercises cane for flexion, ER  and chest press x20 each     Shoulder Exercises: Standing   Other Standing Exercises ball on table (red swiss) for circles and flexion x98min   Other Standing Exercises 4 way isometrics 5sec x 5each way     Moist Heat Therapy   Number Minutes Moist Heat 15 Minutes   Moist Heat Location Shoulder     Electrical Stimulation   Electrical Stimulation Location right shoulder   Electrical Stimulation Action premod   Electrical Stimulation Parameters 80-150hz  x60min   Electrical Stimulation Goals Pain     Manual Therapy   Manual Therapy Passive ROM   Passive ROM gentle PROM for right shoulder flexion/ER/IR with gentle range, no guarding or restrictions.  Rhythmic stabs for flex/ext at 90 degrees and IR/ER in scaption                  PT Short Term Goals - 11/10/16 1021      PT SHORT TERM GOAL #1   Title STG's=LTG's.           PT Long Term Goals - 11/21/16 0820      PT LONG TERM GOAL #1   Title Ind with advanced HEP.   Time 8   Period Weeks   Status On-going     PT LONG TERM GOAL #2   Title Active antigravity right shoulder flexion to 155 degrees so the patient can easily reach overhead   Time 8   Period Weeks   Status On-going     PT LONG TERM GOAL #3   Title Perform ADL's with pain not > 3/10.   Time 8   Period Weeks   Status On-going     PT LONG TERM GOAL #4   Title Active right shoulder ER to 85 degrees to allow for easily donning/doffing of apparel.   Time 8   Period Weeks   Status On-going     PT LONG TERM GOAL #5   Title Increase right shoulder strength to a solid 4+/5 to increase stability for performance of functional activities   Time 8   Period Weeks   Status On-going               Plan - 11/21/16 0848    Clinical Impression Statement Patient tolerated treatment with no pain complaints and able to progress per protocol. Patient has full PROM in right shoulder today. Patient reported using 5# weights for supine ER, flexion and IR.  Patient also playing golf, shoveling mulch and doing t-band exercises. Educated patient on protocol and progress to allow healing. Patient requested heat /ES post treaatment today. Patient progressing toward goals.    Rehab Potential Excellent  PT Frequency 3x / week   PT Duration 4 weeks   PT Treatment/Interventions ADLs/Self Care Home Management;Cryotherapy;Electrical Stimulation;Moist Heat;Ultrasound;Patient/family education;Therapeutic exercise;Therapeutic activities;Passive range of motion   PT Next Visit Plan Progress per standard RTC repair protocol ([employer] ortho).   Consulted and Agree with Plan of Care Patient      Patient will benefit from skilled therapeutic intervention in order to improve the following deficits and impairments:  Decreased range of motion, Decreased activity tolerance, Pain  Visit Diagnosis: Acute pain of right shoulder     Problem List Patient Active Problem List   Diagnosis Date Noted  . Expressive aphasia 07/15/2016  . Chest pain 09/26/2015  . Elevated TSH 05/08/2015  . S/P CABG x 4   . Bilateral carotid artery disease ([place]) 04/28/2015  . BMI 28.0-28.9,adult   . Unstable angina ([place]) 03/18/2014  . Type 2 diabetes mellitus without complication, without long-term current use of insulin ([place]) 09/21/2013  . COPD (chronic obstructive pulmonary disease) ([place]) 09/21/2013  . PTSD (post-traumatic stress disorder) 09/21/2013  . CAD (coronary artery disease) 04/16/2013  . HTN (hypertension) 04/16/2013  . Hyperlipidemia 04/16/2013    Kevis Qu P, PTA 11/21/2016, 9:07 AM  [facility] [place], [place], [address] Phone: [phone]   Fax:  [phone]  Name: Eduardo [name] MRN: [id number] Date of Birth: Jul 26, 1945

## 2016-11-22 LAB — BASIC METABOLIC PANEL
BUN/Creatinine Ratio: 24 (ref 10–24)
BUN: 21 mg/dL (ref 8–27)
CO2: 20 mmol/L (ref 20–29)
CREATININE: 0.89 mg/dL (ref 0.76–1.27)
Calcium: 9.3 mg/dL (ref 8.6–10.2)
Chloride: 104 mmol/L (ref 96–106)
GFR calc Af Amer: 99 mL/min/{1.73_m2} (ref >59)
GFR, EST NON AFRICAN AMERICAN: 86 mL/min/{1.73_m2} (ref >59)
Glucose: 116 mg/dL — ABNORMAL HIGH (ref 65–99)
Potassium: 5.5 mmol/L — ABNORMAL HIGH (ref 3.5–5.2)
SODIUM: 142 mmol/L (ref 134–144)

## 2016-11-23 ENCOUNTER — Telehealth: Payer: Self-pay | Admitting: *Deleted

## 2016-11-23 NOTE — Telephone Encounter (Signed)
Patient came in this office requesting lab results and wanting to know if he may restart medication.  Per Dr. Evette Doffing potassium is 5.5 patient may restart metformin but is to never take benazepril. BP checked in office 110/70 P-71.  Patient is to check BP daily and call back with readings.

## 2016-11-24 ENCOUNTER — Ambulatory Visit: Payer: Medicare Other | Admitting: Physical Therapy

## 2016-11-24 DIAGNOSIS — M25511 Pain in right shoulder: Secondary | ICD-10-CM | POA: Diagnosis not present

## 2016-11-24 NOTE — Therapy (Signed)
[facility], [place], [address] Phone: [phone]   Fax:  [phone]  Physical Therapy Treatment  Patient Details  Name: Eduardo [name] MRN: [id number] Date of Birth: 06/27/1945 Referring Provider: Jenetta Loges PA-C.  Encounter Date: 11/24/2016    Past Medical History:  Diagnosis Date  . Arthritis   . Bilateral carotid artery disease ([place]) 04/28/2015   1-39 percent bilateral stenosis noted on Doppler   . CAD (coronary artery disease)   . Cervical disc disease   . Chemical exposure    agent orange   . COPD (chronic obstructive pulmonary disease) ([place])   . Diabetes mellitus without complication ([place])   . Diverticulitis   . Hypertension   . Lumbar disc disease   . Obesity (BMI 30-39.9)   . Peripheral neuropathy    in all extremities  . PTSD (post-traumatic stress disorder)   . Sleep apnea   . Thoracic ascending aortic aneurysm [facility])     Past Surgical History:  Procedure Laterality Date  . BACK SURGERY    . CARDIAC CATHETERIZATION    . CARDIAC CATHETERIZATION  03/18/14   difficult to determine culprit vesel  . CARDIAC CATHETERIZATION N/A 04/29/2015   Procedure: Left Heart Cath and Coronary Angiography;  Surgeon: [name], MD;  Location: [place] CV LAB;  Service: Cardiovascular;  Laterality: N/A;  . CARPOMETACARPAL ([place]) FUSION OF THUMB Right 08/04/2016   Procedure: CARPOMETACARPAL [facility]) FUSION OF THUMB;  Surgeon: Iran Planas, MD;  Location: [place];  Service: Orthopedics;  Laterality: Right;  . CERVICAL SPINE SURGERY    . CHOLECYSTECTOMY    . CORONARY ARTERY BYPASS GRAFT N/A 05/04/2015   Procedure: CORONARY ARTERY BYPASS GRAFTING (CABG)  x four,  using left internal mammary artery, and bilateral thigh greater saphenous veins;  Surgeon: [name], MD;  Location: [place];  Service: Open Heart Surgery;  Laterality: N/A;  . EYE SURGERY Right    cataract  . I&D EXTREMITY Left  05/30/2013   Procedure: IRRIGATION AND DEBRIDEMENT Left Hand and Foreign body removal;  Surgeon: [name], MD;  Location: [place];  Service: Orthopedics;  Laterality: Left;  . JOINT REPLACEMENT    . LEFT HEART CATHETERIZATION WITH CORONARY ANGIOGRAM N/A 03/18/2014   Procedure: LEFT HEART CATHETERIZATION WITH CORONARY ANGIOGRAM;  Surgeon: [name], MD;  Location: [facility] CATH LAB;  Service: Cardiovascular;  Laterality: N/A;  . LUMBAR LAMINECTOMY    . PARTIAL COLECTOMY     For diverticulitis  . PTCA     unsuccesful  . REPLACEMENT TOTAL KNEE     right   . SHOULDER ARTHROSCOPY    . SHOULDER SURGERY Bilateral   . TEE WITHOUT CARDIOVERSION N/A 05/04/2015   Procedure: TRANSESOPHAGEAL ECHOCARDIOGRAM (TEE);  Surgeon: [name], MD;  Location: [place];  Service: Open Heart Surgery;  Laterality: N/A;    There were no vitals filed for this visit.      Subjective Assessment - 11/24/16 1547    Subjective Pain not too bad.  I've been golfing.  used my driver.  I'm doing all these exercses and more at home.   Pain Score 3    Pain Onset More than a month ago                         [facility] Adult PT Treatment/Exercise - 11/24/16 0001      Exercises   Exercises Shoulder     Shoulder  Exercises: Sidelying   External Rotation Weight (lbs) 1# sdly ER to fatigue (Patient stated:  "I use 5# at home."     Shoulder Exercises: Standing   Other Standing Exercises UE ranger on wall x 3 minutes.   Other Standing Exercises Yellow theraband RW 4.  Patient did so with excellent technique.  States he is doing these exercises at home.     Shoulder Exercises: ROM/Strengthening   UBE (Upper Arm Bike) 8 minutes at 120 RPM's..performed in an active-assistive nature      Modalities   Modalities Electrical Stimulation     Moist Heat Therapy   Number Minutes Moist Heat 20 Minutes   Moist Heat Location --  Right shoulder.     Acupuncturist Location  Right shoulder.   Electrical Stimulation Action Constant Pre-mod.   Electrical Stimulation Parameters 80-150 Hz x 20 minutes.   Electrical Stimulation Goals Pain                  PT Short Term Goals - 11/10/16 1021      PT SHORT TERM GOAL #1   Title STG's=LTG's.           PT Long Term Goals - 11/24/16 1638      PT LONG TERM GOAL #5   Status Not Met               Plan - 11/24/16 1554    Clinical Impression Statement Patient states he does not fel like he needs to continue with physial therapy as he is doing everything at home.  I concur and he is indeed ahead of schedule.  He states he golfed today with increase pain.      Patient will benefit from skilled therapeutic intervention in order to improve the following deficits and impairments:     Visit Diagnosis: Acute pain of right shoulder     Problem List Patient Active Problem List   Diagnosis Date Noted  . Expressive aphasia 07/15/2016  . Chest pain 09/26/2015  . Elevated TSH 05/08/2015  . S/P CABG x 4   . Bilateral carotid artery disease ([place]) 04/28/2015  . BMI 28.0-28.9,adult   . Unstable angina ([place]) 03/18/2014  . Type 2 diabetes mellitus without complication, without long-term current use of insulin ([place]) 09/21/2013  . COPD (chronic obstructive pulmonary disease) ([place]) 09/21/2013  . PTSD (post-traumatic stress disorder) 09/21/2013  . CAD (coronary artery disease) 04/16/2013  . HTN (hypertension) 04/16/2013  . Hyperlipidemia 04/16/2013   PHYSICAL THERAPY DISCHARGE SUMMARY  Visits from Start of Care: 3.  Current functional level related to goals / functional outcomes: See above.   Remaining deficits: All but strength goal met.   Education / Equipment: HEP. Plan: Patient agrees to discharge.  Patient goals were not met. Patient is being discharged due to meeting the stated rehab goals.  ?????      Avanish Cerullo, [place] MPT 11/24/2016, 4:39 PM  [facility] [address] [place], [place], [address] Phone: [phone]   Fax:  [phone]  Name: Eduardo [name] MRN: [id number] Date of Birth: [birth year]

## 2016-11-28 DIAGNOSIS — Z4789 Encounter for other orthopedic aftercare: Secondary | ICD-10-CM | POA: Diagnosis not present

## 2016-11-29 DIAGNOSIS — M1811 Unilateral primary osteoarthritis of first carpometacarpal joint, right hand: Secondary | ICD-10-CM | POA: Diagnosis not present

## 2016-12-26 ENCOUNTER — Ambulatory Visit: Payer: Medicare Other | Admitting: Cardiovascular Disease

## 2016-12-31 ENCOUNTER — Telehealth: Payer: Self-pay | Admitting: Pediatrics

## 2016-12-31 NOTE — Telephone Encounter (Signed)
Lmtcb/ww  

## 2017-01-03 ENCOUNTER — Encounter: Payer: Self-pay | Admitting: Cardiovascular Disease

## 2017-01-03 ENCOUNTER — Ambulatory Visit (INDEPENDENT_AMBULATORY_CARE_PROVIDER_SITE_OTHER): Payer: Medicare Other | Admitting: Cardiovascular Disease

## 2017-01-03 VITALS — BP 111/68 | HR 70 | Ht 68.0 in | Wt 185.2 lb

## 2017-01-03 DIAGNOSIS — Z789 Other specified health status: Secondary | ICD-10-CM

## 2017-01-03 DIAGNOSIS — I712 Thoracic aortic aneurysm, without rupture: Secondary | ICD-10-CM

## 2017-01-03 DIAGNOSIS — I25768 Atherosclerosis of bypass graft of coronary artery of transplanted heart with other forms of angina pectoris: Secondary | ICD-10-CM | POA: Diagnosis not present

## 2017-01-03 DIAGNOSIS — I6523 Occlusion and stenosis of bilateral carotid arteries: Secondary | ICD-10-CM | POA: Diagnosis not present

## 2017-01-03 DIAGNOSIS — E785 Hyperlipidemia, unspecified: Secondary | ICD-10-CM | POA: Diagnosis not present

## 2017-01-03 DIAGNOSIS — I209 Angina pectoris, unspecified: Secondary | ICD-10-CM

## 2017-01-03 DIAGNOSIS — I7121 Aneurysm of the ascending aorta, without rupture: Secondary | ICD-10-CM

## 2017-01-03 DIAGNOSIS — I10 Essential (primary) hypertension: Secondary | ICD-10-CM

## 2017-01-03 NOTE — Progress Notes (Signed)
SUBJECTIVE: The patient presents follow-up of coronary artery disease and CABG, performed in 2016.  ECG performed on 11/18/16 which I personally interpreted demonstrated sinus rhythm with old anterior infarct.  Since his last visit with me, he underwent shoulder surgery for both the left and right shoulder as well as right thumb surgery. He had been on a vacation in [place], [place], and was diagnosed with hyperkalemia at the [place] there. ACE inhibitor was stopped.  He then had an EGD and was diagnosed with stomach ulcers due to H. pylori. He recently completed treatment for this.  He says he now feels better than he has felt in several years. He denies chest pain, palpitations, leg swelling, shortness of breath.   Review of Systems: As per "subjective", otherwise negative.  Allergies  Allergen Reactions  . Statins Other (See Comments)    Body aches  . Benazepril     hyperkalemia    Current Outpatient Prescriptions  Medication Sig Dispense Refill  . albuterol (PROVENTIL HFA;VENTOLIN HFA) 108 (90 BASE) MCG/ACT inhaler Inhale 1 puff into the lungs every 6 (six) hours as needed for wheezing or shortness of breath.     [name] aspirin EC 81 MG EC tablet Take 1 tablet (81 mg total) by mouth daily.    . carvedilol (COREG) 12.5 MG tablet 25 mg 2 (two) times daily.    . Cholecalciferol (VITAMIN D PO) Take 6,000 Units by mouth daily.     . DULoxetine (CYMBALTA) 20 MG capsule Take 40 mg by mouth daily.     [name] ezetimibe (ZETIA) 10 MG tablet Take 10 mg by mouth at bedtime.     [name] gemfibrozil (LOPID) 600 MG tablet Take 600 mg by mouth 2 (two) times daily before a meal.    . levothyroxine (SYNTHROID, LEVOTHROID) 25 MCG tablet Take 1 tablet (25 mcg total) by mouth daily before breakfast. 30 tablet 1  . metFORMIN (GLUCOPHAGE) 1000 MG tablet Take 1,000 mg by mouth 2 (two) times daily with a meal.    . nitroGLYCERIN (NITROSTAT) 0.4 MG SL tablet Place 0.4 mg under the tongue every 5 (five) minutes  as needed for chest pain.    [name] tetrahydrozoline 0.05 % ophthalmic solution Place 1-2 drops into both eyes 3 (three) times daily as needed (for dry/irritated eyes).     No current facility-administered medications for this visit.     Past Medical History:  Diagnosis Date  . Arthritis   . Bilateral carotid artery disease ([place]) 04/28/2015   1-39 percent bilateral stenosis noted on Doppler   . CAD (coronary artery disease)   . Cervical disc disease   . Chemical exposure    agent orange   . COPD (chronic obstructive pulmonary disease) ([place])   . Diabetes mellitus without complication ([place])   . Diverticulitis   . Hypertension   . Lumbar disc disease   . Obesity (BMI 30-39.9)   . Peripheral neuropathy    in all extremities  . PTSD (post-traumatic stress disorder)   . Sleep apnea   . Thoracic ascending aortic aneurysm [facility])     Past Surgical History:  Procedure Laterality Date  . BACK SURGERY    . CARDIAC CATHETERIZATION    . CARDIAC CATHETERIZATION  03/18/14   difficult to determine culprit vesel  . CARDIAC CATHETERIZATION N/A 04/29/2015   Procedure: Left Heart Cath and Coronary Angiography;  Surgeon: [name], MD;  Location: [place] CV LAB;  Service: Cardiovascular;  Laterality: N/A;  .  CARPOMETACARPAL ([place]) FUSION OF THUMB Right 08/04/2016   Procedure: CARPOMETACARPAL [facility]) FUSION OF THUMB;  Surgeon: Iran Planas, MD;  Location: [place];  Service: Orthopedics;  Laterality: Right;  . CERVICAL SPINE SURGERY    . CHOLECYSTECTOMY    . CORONARY ARTERY BYPASS GRAFT N/A 05/04/2015   Procedure: CORONARY ARTERY BYPASS GRAFTING (CABG)  x four,  using left internal mammary artery, and bilateral thigh greater saphenous veins;  Surgeon: [name], MD;  Location: [place];  Service: Open Heart Surgery;  Laterality: N/A;  . EYE SURGERY Right    cataract  . I&D EXTREMITY Left 05/30/2013   Procedure: IRRIGATION AND DEBRIDEMENT Left Hand and Foreign body removal;  Surgeon: [name], MD;  Location: [place];  Service: Orthopedics;  Laterality: Left;  . JOINT REPLACEMENT    . LEFT HEART CATHETERIZATION WITH CORONARY ANGIOGRAM N/A 03/18/2014   Procedure: LEFT HEART CATHETERIZATION WITH CORONARY ANGIOGRAM;  Surgeon: [name], MD;  Location: [facility] CATH LAB;  Service: Cardiovascular;  Laterality: N/A;  . LUMBAR LAMINECTOMY    . PARTIAL COLECTOMY     For diverticulitis  . PTCA     unsuccesful  . REPLACEMENT TOTAL KNEE     right   . SHOULDER ARTHROSCOPY    . SHOULDER SURGERY Bilateral   . TEE WITHOUT CARDIOVERSION N/A 05/04/2015   Procedure: TRANSESOPHAGEAL ECHOCARDIOGRAM (TEE);  Surgeon: [name], MD;  Location: [place];  Service: Open Heart Surgery;  Laterality: N/A;    Social History   Social History  . Marital status: Married    Spouse name: N/A  . Number of children: N/A  . Years of education: N/A   Occupational History  . Not on file.   Social History Main Topics  . Smoking status: Former Smoker    Packs/day: 0.75    Years: 30.00    Types: Cigarettes    Start date: 01/02/1961    Quit date: 05/31/1987  . Smokeless tobacco: Never Used  . Alcohol use No  . Drug use: No  . Sexual activity: Not on file   Other Topics Concern  . Not on file   Social History Narrative  . No narrative on file     Vitals:   01/03/17 1519  BP: 111/68  Pulse: 70  SpO2: 96%  Weight: 185 lb 3.2 oz (84 kg)  Height: 5\' 8"  (1.727 m)    Wt Readings from Last 3 Encounters:  01/03/17 185 lb 3.2 oz (84 kg)  11/18/16 190 lb 6.4 oz (86.4 kg)  09/08/16 184 lb (83.5 kg)     PHYSICAL EXAM General: NAD HEENT: Normal. Neck: No JVD, no thyromegaly. Lungs: Clear to auscultation bilaterally with normal respiratory effort. CV: Nondisplaced PMI.  Regular rate and rhythm, normal S1/S2, no S3/S4, no murmur. No pretibial or periankle edema.  No carotid bruit.   Abdomen: Soft, nontender, no distention.  Neurologic: Alert and oriented.  Psych: Normal affect. Skin:  Normal. Musculoskeletal: No gross deformities.    ECG: Most recent ECG reviewed.   Labs: Lab Results  Component Value Date/Time   K 5.5 (H) 11/21/2016 09:20 AM   BUN 21 11/21/2016 09:20 AM   CREATININE 0.89 11/21/2016 09:20 AM   ALT 14 11/18/2016 05:13 PM   TSH 2.100 03/16/2016 04:57 PM   HGB 11.1 (L) 11/18/2016 05:13 PM     Lipids: Lab Results  Component Value Date/Time   LDLCALC 43 07/16/2016 07:01 AM   LDLCALC 57 01/27/2015 09:08 AM  CHOL 130 07/16/2016 07:01 AM   CHOL 132 01/27/2015 09:08 AM   TRIG 308 (H) 07/16/2016 07:01 AM   HDL 25 (L) 07/16/2016 07:01 AM   HDL 36 (L) 01/27/2015 09:08 AM       ASSESSMENT AND PLAN:  1. CAD s/p 4-v CABG: Stable ischemic heart disease. Continue aspirin 81 mg and Coreg. No longer on ACEI due to hyperkalemia.  2. Essential HTN: Controlled. No changes.  3. Hyperlipidemia: Intolerant to statin therapy. Lipids 07/16/16, total cholesterol 130, triglycerides 308, HDL 25, LDL 43. Continue Zetia 10 mg.  4. Thoracic aortic aneurysm: Followed by [facility].  5. Carotid artery stenosis: Carotid Dopplers in October 2015 demonstrated 1-39% bilateral carotid artery stenosis. Continue aspirin. Intolerant to statins.  6. Paroxysmal atrial fibrillation: No further episodes. No longer on amiodarone.     Disposition: Follow up 1 year   [name], M.D., F.A.C.C.

## 2017-01-03 NOTE — Patient Instructions (Signed)

## 2017-01-12 ENCOUNTER — Ambulatory Visit: Payer: Medicare Other

## 2017-01-16 ENCOUNTER — Encounter: Payer: Self-pay | Admitting: Nurse Practitioner

## 2017-01-16 ENCOUNTER — Ambulatory Visit (INDEPENDENT_AMBULATORY_CARE_PROVIDER_SITE_OTHER): Payer: Medicare Other | Admitting: Nurse Practitioner

## 2017-01-16 VITALS — BP 110/72 | HR 63 | Temp 97.8°F | Ht 68.0 in | Wt 184.0 lb

## 2017-01-16 DIAGNOSIS — R197 Diarrhea, unspecified: Secondary | ICD-10-CM

## 2017-01-16 DIAGNOSIS — I6523 Occlusion and stenosis of bilateral carotid arteries: Secondary | ICD-10-CM | POA: Diagnosis not present

## 2017-01-16 MED ORDER — METRONIDAZOLE 500 MG PO TABS: 500.0000 mg | ORAL_TABLET | Freq: Two times a day (BID) | ORAL | 0 refills | Status: DC

## 2017-01-16 NOTE — Progress Notes (Signed)
   Subjective:    Patient ID: Eduardo [name], male    DOB: 12/22/1945, 71 y.o.   MRN: [id number]  HPI Patient comes in today c/o diarrhea. It has been going on for 4 days- goes >10times a day. He has been taking imodium OTC that has not helped. He was treated for hpyori about 1 month ago and had been doing well up until now. Denies eating out or change in diet.    Review of Systems  Constitutional: Negative.   HENT: Negative.   Respiratory: Negative.   Cardiovascular: Negative.   Gastrointestinal: Positive for abdominal pain (sore he says) and diarrhea. Negative for nausea and vomiting.  Neurological: Negative.   Psychiatric/Behavioral: Negative.   All other systems reviewed and are negative.      Objective:   Physical Exam  Constitutional: He is oriented to person, place, and time. He appears well-nourished. No distress.  Cardiovascular: Normal rate and regular rhythm.   Pulmonary/Chest: Effort normal and breath sounds normal.  Abdominal: He exhibits no distension. There is no tenderness.  Hypoactive bowel sounds  Neurological: He is alert and oriented to person, place, and time.  Skin: Skin is warm.  Psychiatric: He has a normal mood and affect. His behavior is normal. Judgment and thought content normal.   BP 110/72   Pulse 63   Temp 97.8 F (36.6 C) (Oral)   Ht 5\' 8"  (1.727 m)   Wt 184 lb (83.5 kg)   BMI 27.98 kg/m        Assessment & Plan:   1. Diarrhea, unspecified type    Meds ordered this encounter  Medications  . metroNIDAZOLE (FLAGYL) 500 MG tablet    Sig: Take 1 tablet (500 mg total) by mouth 2 (two) times daily.    Dispense:  14 tablet    Refill:  0    Order Specific Question:   Supervising Provider    Answer:   Evette Doffing, CAROL L [[id number]]   Orders Placed This Encounter  Procedures  . Cdiff NAA+O+P+Stool Culture   Do not start flagyl until have done stool specimens Force fluids Continue imodium OTC rto prn  Mary-Margaret Hassell Done, FNP

## 2017-01-16 NOTE — Patient Instructions (Signed)
Cholera Cholera is an infection of the intestines by the bacterium Vibrio cholerae. The infection can cause excessive passing of watery stools (diarrhea), vomiting, and other symptoms, often leading to severe dehydration. Dehydration is when you lose more fluids from your body than you take in. This can be life-threatening. If you have cholera, it is very important to get treatment as soon as possible. What are the causes? This condition is caused by the bacterium Vibrio cholerae. You can get infected by:  Drinking unclean (contaminated) water.  Eating contaminated food.  Coming into contact with the stool of an infected person and passing the bacteria to your mouth.  What increases the risk? This condition is more likely to develop in:  People who live in or have traveled to areas with poor sanitation.  People who live in areas where there is no access to clean water.  What are the signs or symptoms? Symptoms of this condition may include:  Diarrhea.  Vomiting.  Leg or abdominal cramps.  Nausea.  Unusual tiredness.  Weakness.  Dizziness.  Dry and sticky mouth.  Headache.  Smaller amounts of urine than usual.  Being very thirsty.  Having a fever.  In some cases, there are no symptoms of this condition. How is this diagnosed? This condition may be diagnosed with:  A stool culture test. This is a method in which a stool sample is checked for bacteria under a microscope. This is the most reliable method of diagnosis.  A rapid diagnostic test. This method may be used in areas where there are limited lab testing resources available. It is less reliable than a stool culture test, but it can be used to provide early signs that a cholera outbreak may be taking place at a certain location. If possible, the results of a rapid test should be confirmed by a stool culture test.  How is this treated? This condition is treated with fluids to keep your body hydrated. Depending  on how sick you are, you may be given fluids:  Through your mouth (oral rehydration therapy).  Through an IV tube inserted into one of your veins.  In some cases, you may also be given antibiotic medicine. Follow these instructions at home:  Take over-the-counter and prescription medicines only as told by your health care provider.  If you were prescribed an antibiotic medicine, take it as told by your health care provider. Do not stop taking the antibiotic even if you start to feel better.  Wash your hands with soap and water often, especially after going to the bathroom and before preparing food. If soap and water are not available, use hand sanitizer.  Drink enough fluids to keep your urine clear or pale yellow.  Keep all follow-up visits as told by your health care provider. This is important. How is this prevented?  In areas of cholera contamination or a cholera outbreak: ? Boil your water before drinking it, or drink bottled water. Do not drink any beverage that has ice in it. ? Do not eat raw vegetables. This includes salad. ? Only eat fruit that you have peeled yourself. Do not eat fruit that cannot be peeled, such as peaches. ? Only eat food that is fully cooked. Do not eat raw fish or shellfish. ? Do not eat food from street vendors.  Always wash your hands with soap and water after going to the bathroom and before preparing food. If soap and water are not available, use hand sanitizer.  Ask your health  care provider about getting the cholera vaccine. Contact a health care provider if:  You have watery diarrhea or other symptoms of cholera.  Your symptoms get worse.  You have new symptoms. Get help right away if:  You faint.  You become confused.  Your eyes look sunken.  You have a rapid heartbeat.  Your breathing is faster than normal.  You do not have any tears while crying.  You are not urinating.  Your urine is very dark.  Your skin feels very dry,  and if you pinch it, it stays pinched after you let go. This information is not intended to replace advice given to you by your health care provider. Make sure you discuss any questions you have with your health care provider. Document Released: 02/05/2002 Document Revised: 05/09/2016 Document Reviewed: 05/09/2016 Elsevier Interactive Patient Education  [employer].

## 2017-01-17 ENCOUNTER — Other Ambulatory Visit: Payer: Medicare Other

## 2017-01-17 DIAGNOSIS — R197 Diarrhea, unspecified: Secondary | ICD-10-CM | POA: Diagnosis not present

## 2017-01-24 ENCOUNTER — Encounter: Payer: Self-pay | Admitting: *Deleted

## 2017-01-24 ENCOUNTER — Ambulatory Visit (INDEPENDENT_AMBULATORY_CARE_PROVIDER_SITE_OTHER): Payer: Medicare Other | Admitting: *Deleted

## 2017-01-24 VITALS — BP 102/59 | HR 71 | Ht 64.5 in | Wt 182.0 lb

## 2017-01-24 DIAGNOSIS — Z0000 Encounter for general adult medical examination without abnormal findings: Secondary | ICD-10-CM | POA: Diagnosis not present

## 2017-01-24 NOTE — Patient Instructions (Addendum)
  [name],  Thank you for taking time to come for your Medicare Wellness Visit. I appreciate your ongoing commitment to your health goals. Please review the following plan we discussed and let me know if I can assist you in the future.   These are the goals we discussed: Goals    . Exercise 150 minutes per week (moderate activity)       This is a list of the screening recommended for you and due dates:  Health Maintenance  Topic Date Due  . Eye exam for diabetics  01/03/1956  . Urine Protein Check  01/03/1956  . Colon Cancer Screening  01/03/1995  . Pneumonia vaccines (1 of 2 - PCV13) 01/02/2010  . Flu Shot  12/28/2016  . Complete foot exam   01/06/2017  . Hemoglobin A1C  01/13/2017  . Tetanus Vaccine  05/29/2023  .  Hepatitis C: One time screening is recommended by Center for Disease Control  (CDC) for  adults born from [birth year] through 1966.   Completed   Empty bladder every 2 hours to help with incontinence. If you haven't heard from your stool culture results in a couple of days please call our office.  Check with your insurance about the cost of Shingrix (shingles vaccine)

## 2017-01-25 ENCOUNTER — Telehealth: Payer: Self-pay

## 2017-01-25 ENCOUNTER — Encounter (HOSPITAL_COMMUNITY): Payer: Self-pay

## 2017-01-25 ENCOUNTER — Emergency Department (HOSPITAL_COMMUNITY)
Admission: EM | Admit: 2017-01-25 | Discharge: 2017-01-25 | Disposition: A | Discharge status: Home / Self Care | Payer: Medicare Other | Attending: Emergency Medicine | Admitting: Emergency Medicine

## 2017-01-25 ENCOUNTER — Emergency Department (HOSPITAL_COMMUNITY): Payer: Medicare Other

## 2017-01-25 DIAGNOSIS — J449 Chronic obstructive pulmonary disease, unspecified: Secondary | ICD-10-CM | POA: Diagnosis not present

## 2017-01-25 DIAGNOSIS — Z951 Presence of aortocoronary bypass graft: Secondary | ICD-10-CM | POA: Diagnosis not present

## 2017-01-25 DIAGNOSIS — E119 Type 2 diabetes mellitus without complications: Secondary | ICD-10-CM | POA: Insufficient documentation

## 2017-01-25 DIAGNOSIS — I10 Essential (primary) hypertension: Secondary | ICD-10-CM | POA: Diagnosis not present

## 2017-01-25 DIAGNOSIS — Z87891 Personal history of nicotine dependence: Secondary | ICD-10-CM | POA: Diagnosis not present

## 2017-01-25 DIAGNOSIS — R1084 Generalized abdominal pain: Secondary | ICD-10-CM | POA: Insufficient documentation

## 2017-01-25 DIAGNOSIS — R109 Unspecified abdominal pain: Secondary | ICD-10-CM | POA: Diagnosis not present

## 2017-01-25 DIAGNOSIS — I2510 Atherosclerotic heart disease of native coronary artery without angina pectoris: Secondary | ICD-10-CM | POA: Insufficient documentation

## 2017-01-25 DIAGNOSIS — E875 Hyperkalemia: Secondary | ICD-10-CM | POA: Insufficient documentation

## 2017-01-25 DIAGNOSIS — R197 Diarrhea, unspecified: Secondary | ICD-10-CM | POA: Diagnosis not present

## 2017-01-25 DIAGNOSIS — R634 Abnormal weight loss: Secondary | ICD-10-CM | POA: Diagnosis not present

## 2017-01-25 DIAGNOSIS — Z96651 Presence of right artificial knee joint: Secondary | ICD-10-CM | POA: Diagnosis not present

## 2017-01-25 DIAGNOSIS — R110 Nausea: Secondary | ICD-10-CM | POA: Diagnosis not present

## 2017-01-25 LAB — CBC WITH DIFFERENTIAL/PLATELET
BASOS ABS: 0 10*3/uL (ref 0.0–0.1)
Basophils Relative: 0 %
Eosinophils Absolute: 0 10*3/uL (ref 0.0–0.7)
Eosinophils Relative: 1 %
HEMATOCRIT: 38.1 % — AB (ref 39.0–52.0)
HEMOGLOBIN: 12.3 g/dL — AB (ref 13.0–17.0)
LYMPHS ABS: 1.8 10*3/uL (ref 0.7–4.0)
LYMPHS PCT: 27 %
MCH: 27.5 pg (ref 26.0–34.0)
MCHC: 32.3 g/dL (ref 30.0–36.0)
MCV: 85 fL (ref 78.0–100.0)
Monocytes Absolute: 0.5 10*3/uL (ref 0.1–1.0)
Monocytes Relative: 8 %
NEUTROS ABS: 4.3 10*3/uL (ref 1.7–7.7)
Neutrophils Relative %: 64 %
PLATELETS: 225 10*3/uL (ref 150–400)
RBC: 4.48 MIL/uL (ref 4.22–5.81)
RDW: 15.7 % — ABNORMAL HIGH (ref 11.5–15.5)
WBC: 6.7 10*3/uL (ref 4.0–10.5)

## 2017-01-25 LAB — COMPREHENSIVE METABOLIC PANEL
ALT: 12 U/L — AB (ref 17–63)
AST: 17 U/L (ref 15–41)
Albumin: 3.7 g/dL (ref 3.5–5.0)
Alkaline Phosphatase: 48 U/L (ref 38–126)
Anion gap: 8 (ref 5–15)
BUN: 33 mg/dL — ABNORMAL HIGH (ref 6–20)
CHLORIDE: 108 mmol/L (ref 101–111)
CO2: 26 mmol/L (ref 22–32)
CREATININE: 1.12 mg/dL (ref 0.61–1.24)
Calcium: 9.3 mg/dL (ref 8.9–10.3)
Glucose, Bld: 133 mg/dL — ABNORMAL HIGH (ref 65–99)
POTASSIUM: 5.5 mmol/L — AB (ref 3.5–5.1)
SODIUM: 142 mmol/L (ref 135–145)
Total Bilirubin: 0.6 mg/dL (ref 0.3–1.2)
Total Protein: 7.1 g/dL (ref 6.5–8.1)

## 2017-01-25 LAB — URINALYSIS, ROUTINE W REFLEX MICROSCOPIC
BILIRUBIN URINE: NEGATIVE
Glucose, UA: NEGATIVE mg/dL
Hgb urine dipstick: NEGATIVE
Ketones, ur: NEGATIVE mg/dL
LEUKOCYTES UA: NEGATIVE
Nitrite: NEGATIVE
PH: 5 (ref 5.0–8.0)
Protein, ur: NEGATIVE mg/dL
Specific Gravity, Urine: 1.026 (ref 1.005–1.030)

## 2017-01-25 LAB — I-STAT CG4 LACTIC ACID, ED: Lactic Acid, Venous: 1.26 mmol/L (ref 0.5–1.9)

## 2017-01-25 LAB — CDIFF NAA+O+P+STOOL CULTURE
CDIFFPCR: NEGATIVE
E coli, Shiga toxin Assay: NEGATIVE

## 2017-01-25 LAB — LIPASE, BLOOD: LIPASE: 31 U/L (ref 11–51)

## 2017-01-25 MED ORDER — DICYCLOMINE HCL 20 MG PO TABS: 20.0000 mg | ORAL_TABLET | Freq: Two times a day (BID) | ORAL | 0 refills | Status: DC

## 2017-01-25 MED ORDER — SUCRALFATE 1 GM/10ML PO SUSP: 1.0000 g | Freq: Three times a day (TID) | ORAL | 0 refills | Status: DC

## 2017-01-25 MED ORDER — ONDANSETRON HCL 4 MG/2ML IJ SOLN
4.0000 mg | Freq: Once | INTRAMUSCULAR | Status: AC
  Administered 2017-01-25: 4 mg via INTRAVENOUS
  Filled 2017-01-25: qty 2

## 2017-01-25 MED ORDER — SODIUM CHLORIDE 0.9 % IV BOLUS (SEPSIS)
1000.0000 mL | Freq: Once | INTRAVENOUS | Status: AC
  Administered 2017-01-25: 1000 mL via INTRAVENOUS

## 2017-01-25 MED ORDER — DICYCLOMINE HCL 10 MG PO CAPS
10.0000 mg | ORAL_CAPSULE | Freq: Once | ORAL | Status: AC
  Administered 2017-01-25: 10 mg via ORAL
  Filled 2017-01-25: qty 1

## 2017-01-25 MED ORDER — IOPAMIDOL (ISOVUE-300) INJECTION 61%
100.0000 mL | Freq: Once | INTRAVENOUS | Status: AC | PRN
  Administered 2017-01-25: 100 mL via INTRAVENOUS

## 2017-01-25 MED ORDER — ONDANSETRON 4 MG PO TBDP: 4.0000 mg | ORAL_TABLET | Freq: Three times a day (TID) | ORAL | 0 refills | Status: DC | PRN

## 2017-01-25 NOTE — ED Provider Notes (Signed)
Emergency Department Provider Note   I have reviewed the triage vital signs and the nursing notes.   HISTORY  Chief Complaint Abdominal Pain   HPI Eduardo [name] is a 71 y.o. male with PMH of CAD, COPD, DM, HTN, and thoracic aortic aneurysm presents to the emergency department for evaluation of continued, diffuse abdominal pain. The patient reports 2 months of gradually worsening symptoms. He describes near constant pain with no significant modifying factors. He has seen his primary care physician as well as gastroenterology for this issue. He has associated watery diarrhea with occasional mixes of blood. He reports being called today saying that stool studies performed as an outpatient were normal. Early in the course of illness he had an upper endoscopy at the [facility] that showed H pylori. He was treated for this but symptoms continued. He reports 10 pounds of unintentional weight loss. Denies any fevers or chills. No vomiting. No chest pain or difficulty breathing.   Past Medical History:  Diagnosis Date  . Arthritis   . Bilateral carotid artery disease ([place]) 04/28/2015   1-39 percent bilateral stenosis noted on Doppler   . CAD (coronary artery disease)   . Cervical disc disease   . Chemical exposure    agent orange   . COPD (chronic obstructive pulmonary disease) ([place])   . Diabetes mellitus without complication ([place])   . Diverticulitis   . Hypertension   . Lumbar disc disease   . Obesity (BMI 30-39.9)   . Peripheral neuropathy    in all extremities  . PTSD (post-traumatic stress disorder)   . Sleep apnea    can't wear cpap  . Thoracic ascending aortic aneurysm [facility])     Patient Active Problem List   Diagnosis Date Noted  . Expressive aphasia 07/15/2016  . Chest pain 09/26/2015  . Elevated TSH 05/08/2015  . S/P CABG x 4   . Bilateral carotid artery disease ([place]) 04/28/2015  . BMI 28.0-28.9,adult   . Unstable angina ([place]) 03/18/2014  . Type 2  diabetes mellitus without complication, without Janita Camberos-term current use of insulin ([place]) 09/21/2013  . COPD (chronic obstructive pulmonary disease) ([place]) 09/21/2013  . PTSD (post-traumatic stress disorder) 09/21/2013  . CAD (coronary artery disease) 04/16/2013  . HTN (hypertension) 04/16/2013  . Hyperlipidemia 04/16/2013    Past Surgical History:  Procedure Laterality Date  . BACK SURGERY    . CARDIAC CATHETERIZATION    . CARDIAC CATHETERIZATION  03/18/14   difficult to determine culprit vesel  . CARDIAC CATHETERIZATION N/A 04/29/2015   Procedure: Left Heart Cath and Coronary Angiography;  Surgeon: [name], MD;  Location: [place] CV [facility];  Service: Cardiovascular;  Laterality: N/A;  . CARPOMETACARPAL ([place]) FUSION OF THUMB Right 08/04/2016   Procedure: CARPOMETACARPAL [facility]) FUSION OF THUMB;  Surgeon: Iran Planas, MD;  Location: [place];  Service: Orthopedics;  Laterality: Right;  . CERVICAL SPINE SURGERY    . CHOLECYSTECTOMY    . CORONARY ARTERY BYPASS GRAFT N/A 05/04/2015   Procedure: CORONARY ARTERY BYPASS GRAFTING (CABG)  x four,  using left internal mammary artery, and bilateral thigh greater saphenous veins;  Surgeon: [name], MD;  Location: [place];  Service: Open Heart Surgery;  Laterality: N/A;  . EYE SURGERY Right    cataract  . I&D EXTREMITY Left 05/30/2013   Procedure: IRRIGATION AND DEBRIDEMENT Left Hand and Foreign body removal;  Surgeon: [name], MD;  Location: [place];  Service: Orthopedics;  Laterality: Left;  . JOINT  REPLACEMENT    . LEFT HEART CATHETERIZATION WITH CORONARY ANGIOGRAM N/A 03/18/2014   Procedure: LEFT HEART CATHETERIZATION WITH CORONARY ANGIOGRAM;  Surgeon: [name], MD;  Location: [facility] CATH LAB;  Service: Cardiovascular;  Laterality: N/A;  . LUMBAR LAMINECTOMY    . PARTIAL COLECTOMY     For diverticulitis  . PTCA     unsuccesful  . REPLACEMENT TOTAL KNEE     right   . SHOULDER ARTHROSCOPY    . SHOULDER SURGERY Bilateral     . TEE WITHOUT CARDIOVERSION N/A 05/04/2015   Procedure: TRANSESOPHAGEAL ECHOCARDIOGRAM (TEE);  Surgeon: [name], MD;  Location: [place];  Service: Open Heart Surgery;  Laterality: N/A;    Current Outpatient Rx  . Order #: [id number] Class: Historical Med  . Order #: [id number] Class: OTC  . Order #: [id number] Class: Historical Med  . Order #: [id number] Class: Historical Med  . Order #: [id number] Class: Historical Med  . Order #: [id number] Class: Historical Med  . Order #: [id number] Class: Historical Med  . Order #: [id number] Class: Print  . Order #: [id number] Class: Historical Med  . Order #: [id number] Class: Historical Med  . Order #: [id number] Class: Historical Med  . Order #: [id number] Class: Historical Med  . Order #: [id number] Class: Print  . Order #: [id number] Class: Historical Med  . Order #: [id number] Class: Print  . Order #: [id number] Class: Print    Allergies Statins and Benazepril  Family History  Problem Relation Age of Onset  . Diabetes Father   . Heart disease Father   . Hypertension Father   . Diabetes Paternal Grandmother   . Heart disease Paternal Grandmother   . Hypertension Paternal Grandmother   . Diabetes Paternal Uncle   . Heart disease Paternal Uncle   . Heart disease Paternal Uncle   . Hypertension Paternal Uncle   . Heart disease Paternal Uncle   . Hypertension Mother   . Heart disease Mother   . Drug abuse Son     Social History Social History  Substance Use Topics  . Smoking status: Former Smoker    Packs/day: 0.75    Years: 30.00    Types: Cigarettes    Start date: 01/02/1961    Quit date: 05/31/1987  . Smokeless tobacco: Never Used  . Alcohol use No    Review of Systems  Constitutional: No fever/chills Eyes: No visual changes. ENT: No sore throat. Cardiovascular: Denies chest pain. Respiratory: Denies shortness of breath. Gastrointestinal: Positive abdominal pain. Positive nausea, no vomiting. Positive diarrhea.  No  constipation. Genitourinary: Negative for dysuria. Musculoskeletal: Negative for back pain. Skin: Negative for rash. Neurological: Negative for headaches, focal weakness or numbness.  10-point ROS otherwise negative.  ____________________________________________   PHYSICAL EXAM:  VITAL SIGNS: ED Triage Vitals [01/25/17 1846]  Enc Vitals Group     BP 135/76     Pulse Rate 64     Resp 18     Temp 97.7 F (36.5 C)     Temp Source Oral     SpO2 100 %     Weight 182 lb (82.6 kg)     Height 5\' 4"  (1.626 m)     Pain Score 9   Constitutional: Alert and oriented. Well appearing and in no acute distress. Eyes: Conjunctivae are normal.  Head: Atraumatic. Nose: No congestion/rhinnorhea. Mouth/Throat: Mucous membranes are dry. Neck: No stridor.  Cardiovascular: Normal rate, regular rhythm. Good peripheral circulation. Grossly normal heart sounds.   Respiratory: Normal respiratory effort.  No retractions. Lungs CTAB. Gastrointestinal: Soft with diffuse lower  abdominal tenderness and voluntary guarding. No rebound or peritoneal signs. No distention.  Musculoskeletal: No lower extremity tenderness nor edema. No gross deformities of extremities. Neurologic:  Normal speech and language. No gross focal neurologic deficits are appreciated.  Skin:  Skin is warm, dry and intact. No rash noted.  ____________________________________________   LABS (all labs ordered are listed, but only abnormal results are displayed)  Labs Reviewed  COMPREHENSIVE METABOLIC PANEL - Abnormal; Notable for the following:       Result Value   Potassium 5.5 (*)    Glucose, Bld 133 (*)    BUN 33 (*)    ALT 12 (*)    All other components within normal limits  CBC WITH DIFFERENTIAL/PLATELET - Abnormal; Notable for the following:    Hemoglobin 12.3 (*)    HCT 38.1 (*)    RDW 15.7 (*)    All other components within normal limits  LIPASE, BLOOD  URINALYSIS, ROUTINE W REFLEX MICROSCOPIC  I-STAT CG4 LACTIC  ACID, ED   ____________________________________________  EKG  Rate: 56 PR: 223 QTc: 385  NSR. Narrow QRS, Normal axis, No ST elevation or depression. Normal, non-peaked T-waves. No STEMI.  ____________________________________________  RADIOLOGY  Ct Abdomen Pelvis W Contrast  Result Date: 01/25/2017 CLINICAL DATA:  Severe generalized abdominal pain. EXAM: CT ABDOMEN AND PELVIS WITH CONTRAST TECHNIQUE: Multidetector CT imaging of the abdomen and pelvis was performed using the standard protocol following bolus administration of intravenous contrast. CONTRAST:  147mL ISOVUE-300 IOPAMIDOL (ISOVUE-300) INJECTION 61% COMPARISON:  Abdominal ultrasound 07/15/2016 FINDINGS: Lower chest: No acute abnormality. Calcific atherosclerotic disease of the coronary arteries. Hepatobiliary: No focal liver abnormality is seen. No gallstones, gallbladder wall thickening, or biliary dilatation. Pancreas: Unremarkable. No pancreatic ductal dilatation or surrounding inflammatory changes. Spleen: Normal in size without focal abnormality. Adrenals/Urinary Tract: Adrenal glands are unremarkable. Kidneys are normal, without renal calculi, focal lesion, or hydronephrosis. Bladder is unremarkable. Stomach/Bowel: Stomach is within normal limits. Appendix appears normal. No evidence of small bowel wall thickening, distention, or inflammatory changes. Mild mucosal thickening of the transverse colon. Scattered colonic diverticulosis without evidence of diverticulitis. Vascular/Lymphatic: Aortic atherosclerosis. No enlarged abdominal or pelvic lymph nodes. Reproductive: Prostate is unremarkable. Other: No abdominal wall hernia or abnormality. No abdominopelvic ascites. Musculoskeletal: L3-L4 prior posterior fusion with interbody device with intact hardware. Osteoarthritic changes above the level of the fusion with minimal posterior listhesis of L2 on L3. Osteoarthritic changes at L5-S1. Posterior facet arthropathy. IMPRESSION: No  evidence of acute abnormality within the solid abdominal organs. Nonspecific mild mucosal thickening of the transverse colon, usually associated with inflammatory changes. Calcific atherosclerotic disease of the coronary arteries and aorta. Electronically Signed   By: Fidela Salisbury M.D.   On: 01/25/2017 20:17    ____________________________________________   PROCEDURES  Procedure(s) performed:   Procedures  None ____________________________________________   INITIAL IMPRESSION / ASSESSMENT AND PLAN / ED COURSE  Pertinent labs & imaging results that were available during my care of the patient were reviewed by me and considered in my medical decision making (see chart for details).  Patient resents to the emergency department for evaluation of abdominal pain worsening over the past 2 months with associated diarrhea.He's been treated for H. Pylori after upper endoscopy. The patient has focal lower abdominal tenderness with some voluntary guarding worse on the right. Plan for CT abdomen and pelvis for further evaluation with lab work.  08:44 PM Ct with no acute findings to explain symptoms. Mild inflammation over the transverse colon. No abscess, mass,  or other acute findings. Plan for symptom mgmt and GI follow up as already scheduled for lower and repeat upper endoscopy. Patient continues to have mild hyperkalemia. Normal lactate. Doubt ischemic colitis presentation.   At this time, I do not feel there is any life-threatening condition present. I have reviewed and discussed all results (EKG, imaging, lab, urine as appropriate), exam findings with patient. I have reviewed nursing notes and appropriate previous records.  I feel the patient is safe to be discharged home without further emergent workup. Discussed usual and customary return precautions. Patient and family (if present) verbalize understanding and are comfortable with this plan.  Patient will follow-up with their primary care  provider. If they do not have a primary care provider, information for follow-up has been provided to them. All questions have been answered.   ____________________________________________  FINAL CLINICAL IMPRESSION(S) / ED DIAGNOSES  Final diagnoses:  Generalized abdominal pain  Nausea  Diarrhea, unspecified type  Hyperkalemia     MEDICATIONS GIVEN DURING THIS VISIT:  Medications  sodium chloride 0.9 % bolus 1,000 mL (0 mLs Intravenous Stopped 01/25/17 2058)  ondansetron (ZOFRAN) injection 4 mg (4 mg Intravenous Given 01/25/17 1916)  dicyclomine (BENTYL) capsule 10 mg (10 mg Oral Given 01/25/17 1916)  iopamidol (ISOVUE-300) 61 % injection 100 mL (100 mLs Intravenous Contrast Given 01/25/17 1953)     NEW OUTPATIENT MEDICATIONS STARTED DURING THIS VISIT:  New Prescriptions   DICYCLOMINE (BENTYL) 20 MG TABLET    Take 1 tablet (20 mg total) by mouth 2 (two) times daily.   ONDANSETRON (ZOFRAN ODT) 4 MG DISINTEGRATING TABLET    Take 1 tablet (4 mg total) by mouth every 8 (eight) hours as needed for nausea or vomiting.   SUCRALFATE (CARAFATE) 1 GM/10ML SUSPENSION    Take 10 mLs (1 g total) by mouth 4 (four) times daily -  with meals and at bedtime.    Note:  This document was prepared using Dragon voice recognition software and may include unintentional dictation errors.  Nanda Quinton, MD Emergency Medicine    Alleyne Lac, [facility], MD 01/25/17 2107

## 2017-01-25 NOTE — ED Triage Notes (Signed)
Pt reports severe generalized abd pain.  Reports it started 2 months ago.  Reports has had diarrhea, has been diagnosed with h pylori.  History of high potassium.  Denies vomiting but reports nausea.  Pt went back to doctor last week and had stool specimens checked and was told today they were "ok."  Reports has been on several antibiotics without relief of pain.

## 2017-01-25 NOTE — Telephone Encounter (Signed)
Notified patient of lab results. He is still having terrible stomach pain and diarrhea. Wants to know what the next step is. Please advise and route to pool B

## 2017-01-25 NOTE — Discharge Instructions (Signed)

## 2017-01-25 NOTE — Progress Notes (Signed)
Subjective:   JACHOB MCCLEAN is a 71 y.o. male who presents for an Initial Medicare Annual Wellness Visit. Mr Hosley comes in today with his wife. 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 He enjoys working on his hot rod and playing golf. He stays active at home and works in his yard almost daily. He has 2 sons and a daughter and 17 grandchildren, including step grandchildren.  Review of Systems  Reports that his health is a little worse than last year at the present time.   Cardiac Risk Factors include: advanced age (>71men, >47 women);diabetes mellitus;dyslipidemia;male gender;hypertension  GI: Loose stools, bloating, and early satiety. Has been evaluated but needs to f/u. Most stool cultures negative. Some results still pending.   Urinary: Incontinence when he hears water running  Other systems negative today.   Objective:    Today's Vitals   01/24/17 1356  BP: (!) 102/59  Pulse: 71  Weight: 182 lb (82.6 kg)  Height: 5' 4.5" (1.638 m)   Body mass index is 30.76 kg/m.  Current Medications (verified) Outpatient Encounter Prescriptions as of 01/24/2017  Medication Sig  . albuterol (PROVENTIL HFA;VENTOLIN HFA) 108 (90 BASE) MCG/ACT inhaler Inhale 1 puff into the lungs every 6 (six) hours as needed for wheezing or shortness of breath.   [name] aspirin EC 81 MG EC tablet Take 1 tablet (81 mg total) by mouth daily.  . carvedilol (COREG) 12.5 MG tablet 25 mg 2 (two) times daily.  . Cholecalciferol (VITAMIN D PO) Take 6,000 Units by mouth daily.   . DULoxetine (CYMBALTA) 20 MG capsule Take 40 mg by mouth daily.   [name] ezetimibe (ZETIA) 10 MG tablet Take 10 mg by mouth at bedtime.   [name] gemfibrozil (LOPID) 600 MG tablet Take 600 mg by mouth 2 (two) times daily before a meal.  . levothyroxine (SYNTHROID, LEVOTHROID) 25 MCG tablet Take 1 tablet (25 mcg total) by mouth daily before breakfast.  .  metFORMIN (GLUCOPHAGE) 1000 MG tablet Take 1,000 mg by mouth 2 (two) times daily with a meal.  . nitroGLYCERIN (NITROSTAT) 0.4 MG SL tablet Place 0.4 mg under the tongue every 5 (five) minutes as needed for chest pain.  [name] tetrahydrozoline 0.05 % ophthalmic solution Place 1-2 drops into both eyes 3 (three) times daily as needed (for dry/irritated eyes).  . [DISCONTINUED] metroNIDAZOLE (FLAGYL) 500 MG tablet Take 1 tablet (500 mg total) by mouth 2 (two) times daily.   No facility-administered encounter medications on file as of 01/24/2017.     Allergies (verified) Statins and Benazepril   History: Past Medical History:  Diagnosis Date  . Arthritis   . Bilateral carotid artery disease ([place]) 04/28/2015   1-39 percent bilateral stenosis noted on Doppler   . CAD (coronary artery disease)   . Cervical disc disease   . Chemical exposure    agent orange   . COPD (chronic obstructive pulmonary disease) ([place])   . Diabetes mellitus without complication ([place])   . Diverticulitis   . Hypertension   . Lumbar disc disease   . Obesity (BMI 30-39.9)   . Peripheral neuropathy    in all extremities  . PTSD (post-traumatic stress disorder)   . Sleep apnea    can't wear cpap  . Thoracic ascending aortic aneurysm [facility])    Past Surgical History:  Procedure Laterality Date  . BACK SURGERY    . CARDIAC CATHETERIZATION    .  CARDIAC CATHETERIZATION  03/18/14   difficult to determine culprit vesel  . CARDIAC CATHETERIZATION N/A 04/29/2015   Procedure: Left Heart Cath and Coronary Angiography;  Surgeon: [name], MD;  Location: [place] [facility];  Service: Cardiovascular;  Laterality: N/A;  . CARPOMETACARPAL ([place]) FUSION OF THUMB Right 08/04/2016   Procedure: CARPOMETACARPAL [facility]) FUSION OF THUMB;  Surgeon: Iran Planas, MD;  Location: [place];  Service: Orthopedics;  Laterality: Right;  . CERVICAL SPINE SURGERY    . CHOLECYSTECTOMY    . CORONARY ARTERY BYPASS GRAFT N/A 05/04/2015   Procedure:  CORONARY ARTERY BYPASS GRAFTING (CABG)  x four,  using left internal mammary artery, and bilateral thigh greater saphenous veins;  Surgeon: [name], MD;  Location: [place];  Service: Open Heart Surgery;  Laterality: N/A;  . EYE SURGERY Right    cataract  . I&D EXTREMITY Left 05/30/2013   Procedure: IRRIGATION AND DEBRIDEMENT Left Hand and Foreign body removal;  Surgeon: [name], MD;  Location: [place];  Service: Orthopedics;  Laterality: Left;  . JOINT REPLACEMENT    . LEFT HEART CATHETERIZATION WITH CORONARY ANGIOGRAM N/A 03/18/2014   Procedure: LEFT HEART CATHETERIZATION WITH CORONARY ANGIOGRAM;  Surgeon: [name], MD;  Location: [facility] CATH LAB;  Service: Cardiovascular;  Laterality: N/A;  . LUMBAR LAMINECTOMY    . PARTIAL COLECTOMY     For diverticulitis  . PTCA     unsuccesful  . REPLACEMENT TOTAL KNEE     right   . SHOULDER ARTHROSCOPY    . SHOULDER SURGERY Bilateral   . TEE WITHOUT CARDIOVERSION N/A 05/04/2015   Procedure: TRANSESOPHAGEAL ECHOCARDIOGRAM (TEE);  Surgeon: [name], MD;  Location: [place];  Service: Open Heart Surgery;  Laterality: N/A;   Family History  Problem Relation Age of Onset  . Diabetes Father   . Heart disease Father   . Hypertension Father   . Diabetes Paternal Grandmother   . Heart disease Paternal Grandmother   . Hypertension Paternal Grandmother   . Diabetes Paternal Uncle   . Heart disease Paternal Uncle   . Heart disease Paternal Uncle   . Hypertension Paternal Uncle   . Heart disease Paternal Uncle   . Hypertension Mother   . Heart disease Mother   . Drug abuse Son    Social History   Occupational History  . Not on file.   Social History Main Topics  . Smoking status: Former Smoker    Packs/day: 0.75    Years: 30.00    Types: Cigarettes    Start date: 01/02/1961    Quit date: 05/31/1987  . Smokeless tobacco: Never Used  . Alcohol use No  . Drug use: No  . Sexual activity: Yes   Tobacco Counseling No tobacco  use  Activities of Daily Living In your present state of health, do you have any difficulty performing the following activities: 01/24/2017 07/29/2016  Hearing? N N  Vision? N N  Difficulty concentrating or making decisions? Y N  Comment Has some problems with short term memory. Will forget what he's doing sometimes. Started about 2-3 years ago.  -  Walking or climbing stairs? Y N  Comment Some problems with SOB -  Dressing or bathing? N N  Doing errands, shopping? N N  Preparing Food and eating ? N -  Using the Toilet? N -  In the past six months, have you accidently leaked urine? Y -  Comment Has some problems with incontinence when he hears  water running  -  Do you have problems with loss of bowel control? Y -  Comment Has been evaluated.  -  Managing your Medications? N -  Comment uses a pill box -  Managing your Finances? N -  Housekeeping or managing your Housekeeping? N -  Some recent data might be hidden    Immunizations and Health Maintenance Immunization History  Administered Date(s) Administered  . Influenza,inj,Quad PF,6+ Mos 03/09/2015, 12/29/2015  . Influenza-Unspecified 02/27/2013, 02/19/2014  . Tdap 05/28/2013   Health Maintenance Due  Topic Date Due  . OPHTHALMOLOGY EXAM  01/03/1955  . URINE MICROALBUMIN  01/03/1955  . COLONOSCOPY  01/03/1995  . PNA vac Low Risk Adult (1 of 2 - PCV13) 01/02/2010  . INFLUENZA VACCINE  12/28/2016  . FOOT EXAM  01/06/2017  . HEMOGLOBIN A1C  01/13/2017   Patient Care Team: [name], MD as PCP - General (Pediatrics) Justice Britain, MD as Consulting Physician (Orthopedic Surgery) Herminio Commons, MD as Attending Physician (Cardiology) Iran Planas, MD as Consulting Physician (Orthopedic Surgery) Patient is also seen at the [facility] for a lot of his care.     Assessment:   This is a routine wellness examination for [place].   Hearing/Vision screen No deficits noted. Patient had eye exam at the [facility] recently.    Dietary issues and exercise activities discussed: Current Exercise Habits: The patient does not participate in regular exercise at present (Stays active around the house. He works in the yard frequently and plays. ), Exercise limited by: None identified  Diet: [place] 3 meals a day  Goals    . Exercise 150 minutes per week (moderate activity)      Depression Screen PHQ 2/9 Scores 01/24/2017 11/18/2016 07/22/2016 05/19/2016  PHQ - 2 Score 0 0 0 0  PHQ- 9 Score - - - -    Fall Risk Fall Risk  01/24/2017 11/18/2016 07/22/2016 05/19/2016 05/12/2016  Falls in the past year? No No No No No    Cognitive Function:      MMSE - Mini Mental State Exam 01/24/2017  Orientation to time 5  Orientation to Place 5  Registration 3  Attention/ Calculation 5  Recall 3  Language- name 2 objects 2  Language- repeat 1  Language- follow 3 step command 3  Language- read & follow direction 1  Write a sentence 1  Copy design 1  Total score 30       Screening Tests Health Maintenance  Topic Date Due  . OPHTHALMOLOGY EXAM  01/03/1955  . URINE MICROALBUMIN  01/03/1955  . COLONOSCOPY  01/03/1995  . PNA vac Low Risk Adult (1 of 2 - PCV13) 01/02/2010  . INFLUENZA VACCINE  12/28/2016  . FOOT EXAM  01/06/2017  . HEMOGLOBIN A1C  01/13/2017  . TETANUS/TDAP  05/29/2023  . Hepatitis C Screening  Completed   Patient reports having Prevnar and Pneumovax. Records at the [place]. Reports colonoscopy at [facility] about 7 years ago. Records needed.      Plan:.   Keep f/u appointment with Dr Evette Doffing on 02/13/17.  Discuss incontinence at that visit.  Microalbumin at that visit as well.  F/u sooner about GI problems if necessary Obtain colonoscopy records from GI. Empty bladder about every 2 hours to help with incontinence.  Check with insurance about the cost of Shingrix  I have personally reviewed and noted the following in the patient's chart:   . Medical and social history . Use of alcohol, tobacco or illicit  drugs  .  Current medications and supplements . Functional ability and status . Nutritional status . Physical activity . Advanced directives . List of other physicians . Hospitalizations, surgeries, and ER visits in previous 12 months . Vitals . Screenings to include cognitive, depression, and falls . Referrals and appointments  In addition, I have reviewed and discussed with patient certain preventive protocols, quality metrics, and best practice recommendations. A written personalized care plan for preventive services as well as general preventive health recommendations were provided to patient.     Chong Sicilian, RN     I have reviewed and agree with the above AWV documentation.   Assunta Found, MD [place]

## 2017-01-26 ENCOUNTER — Ambulatory Visit (INDEPENDENT_AMBULATORY_CARE_PROVIDER_SITE_OTHER): Payer: Medicare Other | Admitting: Pediatrics

## 2017-01-26 ENCOUNTER — Telehealth: Payer: Self-pay | Admitting: *Deleted

## 2017-01-26 ENCOUNTER — Encounter: Payer: Self-pay | Admitting: Pediatrics

## 2017-01-26 VITALS — BP 134/89 | HR 64 | Temp 97.2°F | Ht 64.0 in | Wt 184.8 lb

## 2017-01-26 DIAGNOSIS — I6523 Occlusion and stenosis of bilateral carotid arteries: Secondary | ICD-10-CM

## 2017-01-26 DIAGNOSIS — N401 Enlarged prostate with lower urinary tract symptoms: Secondary | ICD-10-CM

## 2017-01-26 DIAGNOSIS — R1013 Epigastric pain: Secondary | ICD-10-CM

## 2017-01-26 MED ORDER — OMEPRAZOLE 40 MG PO CPDR: 40.0000 mg | DELAYED_RELEASE_CAPSULE | Freq: Every day | ORAL | 3 refills | Status: DC

## 2017-01-26 MED ORDER — TAMSULOSIN HCL 0.4 MG PO CAPS: 0.4000 mg | ORAL_CAPSULE | Freq: Every day | ORAL | 3 refills | Status: DC

## 2017-01-26 NOTE — Telephone Encounter (Signed)
Seen today. 

## 2017-01-26 NOTE — Telephone Encounter (Signed)
Incoming call from pt Pt seen in ED last PM for abd pain with diarrhea ER Dr told pt to follow up with PCP today No appts available until next week Offered appt with mid level but pt only wants MD No appts available Please advise

## 2017-01-26 NOTE — Progress Notes (Signed)
  Subjective:   Patient ID: Eduardo [name], male    DOB: 1945-06-23, 71 y.o.   MRN: [id number] CC: ER follow up  HPI: Eduardo [name] is a 71 y.o. male presenting for ER follow up  Had EGD showing H pylori and 5 ulcers about a month ago at the [place] Treated for H pylori Didn't improve with treatment Has had some dark red material streaked in stools starting about a week ago Eating makes pain worse No fevers He says he doesn't feel any worse but similar symptoms have been ongoing for about 2 mo now  Seen in ED last night Had CT scan with some inflammation on transverse colon, otherwise unremarkable Was started on metronidazole about a week ago for ongoing diarrhea  Continues to have periumbilical abd pain No vomiting Not able to eat due to pain Says his stomach feels very sensitive to cold fluids  Has had more dribbling of urine stream, not able to fully empty his bladder Ongoing for at least a week Urine studies yesterday in ED were normal  Relevant past medical, surgical, family and social history reviewed. Allergies and medications reviewed and updated. History  Smoking Status  . Former Smoker  . Packs/day: 0.75  . Years: 30.00  . Types: Cigarettes  . Start date: 01/02/1961  . Quit date: 05/31/1987  Smokeless Tobacco  . Never Used   ROS: Per HPI   Objective:    BP 134/89   Pulse 64   Temp (!) 97.2 F (36.2 C) (Oral)   Ht 5\' 4"  (1.626 m)   Wt 184 lb 12.8 oz (83.8 kg)   BMI 31.72 kg/m   Wt Readings from Last 3 Encounters:  01/26/17 184 lb 12.8 oz (83.8 kg)  01/25/17 182 lb (82.6 kg)  01/24/17 182 lb (82.6 kg)    Gen: NAD, alert, lying on exam stable, cooperative with exam, NCAT EYES: EOMI, no conjunctival injection, or no icterus CV: NRRR, normal S1/S2, no murmur, distal pulses 2+ b/l Resp: CTABL, no wheezes, normal WOB Abd: +BS, soft, tender with gentle palpation throughout, ND. no guarding or organomegaly, no rebound Ext: No edema, warm Neuro:  Alert and oriented, strength equal b/l UE and LE, coordination grossly normal MSK: normal muscle bulk  Assessment & Plan:  [place] was seen today for er follow up, abd pain  Diagnoses and all orders for this visit:  Epigastric abdominal pain Has known stomach ulcers per pt and recent H pylori diagnosis, no improvement with treatment Also with recent CT scan with colitis Cont sucralfate QID given in ED Restart PPI Refer to GI Any blood in stools needs to be seen immediately Other return precautions discussed -     omeprazole (PRILOSEC) 40 MG capsule; Take 1 capsule (40 mg total) by mouth daily. -     Ambulatory referral to Gastroenterology  Benign prostatic hyperplasia with lower urinary tract symptoms, symptom details unspecified Denies prior problems with his prostate Nl UA last night Start below Return precautions discussed -     tamsulosin (FLOMAX) 0.4 MG CAPS capsule; Take 1 capsule (0.4 mg total) by mouth daily.   Follow up plan: Return if symptoms worsen or fail to improve.  Has f/u in 2 weeks sched with me Assunta Found, MD [place]

## 2017-01-31 ENCOUNTER — Emergency Department (HOSPITAL_COMMUNITY): Payer: Medicare Other

## 2017-01-31 ENCOUNTER — Encounter (HOSPITAL_COMMUNITY): Payer: Self-pay | Admitting: Emergency Medicine

## 2017-01-31 ENCOUNTER — Ambulatory Visit (INDEPENDENT_AMBULATORY_CARE_PROVIDER_SITE_OTHER): Payer: Medicare Other | Admitting: Internal Medicine

## 2017-01-31 ENCOUNTER — Encounter (INDEPENDENT_AMBULATORY_CARE_PROVIDER_SITE_OTHER): Payer: Self-pay | Admitting: Internal Medicine

## 2017-01-31 ENCOUNTER — Emergency Department (HOSPITAL_COMMUNITY)
Admission: EM | Admit: 2017-01-31 | Discharge: 2017-01-31 | Disposition: A | Discharge status: Home / Self Care | Payer: Medicare Other | Source: Home / Self Care

## 2017-01-31 ENCOUNTER — Emergency Department (HOSPITAL_COMMUNITY)
Admission: EM | Admit: 2017-01-31 | Discharge: 2017-01-31 | Disposition: A | Discharge status: Home / Self Care | Payer: Medicare Other | Attending: Emergency Medicine | Admitting: Emergency Medicine

## 2017-01-31 ENCOUNTER — Encounter (HOSPITAL_COMMUNITY): Payer: Self-pay | Admitting: *Deleted

## 2017-01-31 VITALS — BP 92/50 | HR 64 | Temp 98.0°F | Ht 68.0 in | Wt 181.0 lb

## 2017-01-31 DIAGNOSIS — K2100 Gastro-esophageal reflux disease with esophagitis, without bleeding: Secondary | ICD-10-CM

## 2017-01-31 DIAGNOSIS — Z5321 Procedure and treatment not carried out due to patient leaving prior to being seen by health care provider: Secondary | ICD-10-CM | POA: Insufficient documentation

## 2017-01-31 DIAGNOSIS — J449 Chronic obstructive pulmonary disease, unspecified: Secondary | ICD-10-CM | POA: Diagnosis not present

## 2017-01-31 DIAGNOSIS — R109 Unspecified abdominal pain: Secondary | ICD-10-CM

## 2017-01-31 DIAGNOSIS — Z87891 Personal history of nicotine dependence: Secondary | ICD-10-CM | POA: Diagnosis not present

## 2017-01-31 DIAGNOSIS — I2510 Atherosclerotic heart disease of native coronary artery without angina pectoris: Secondary | ICD-10-CM | POA: Insufficient documentation

## 2017-01-31 DIAGNOSIS — K5730 Diverticulosis of large intestine without perforation or abscess without bleeding: Secondary | ICD-10-CM | POA: Diagnosis not present

## 2017-01-31 DIAGNOSIS — E1140 Type 2 diabetes mellitus with diabetic neuropathy, unspecified: Secondary | ICD-10-CM | POA: Diagnosis not present

## 2017-01-31 DIAGNOSIS — R197 Diarrhea, unspecified: Secondary | ICD-10-CM | POA: Diagnosis not present

## 2017-01-31 DIAGNOSIS — K8020 Calculus of gallbladder without cholecystitis without obstruction: Secondary | ICD-10-CM | POA: Diagnosis not present

## 2017-01-31 DIAGNOSIS — R1084 Generalized abdominal pain: Secondary | ICD-10-CM | POA: Diagnosis not present

## 2017-01-31 DIAGNOSIS — I10 Essential (primary) hypertension: Secondary | ICD-10-CM | POA: Insufficient documentation

## 2017-01-31 LAB — TYPE AND SCREEN
ABO/RH(D): O POS
Antibody Screen: NEGATIVE

## 2017-01-31 LAB — COMPREHENSIVE METABOLIC PANEL
ALK PHOS: 57 U/L (ref 38–126)
ALK PHOS: 54 U/L (ref 38–126)
ALT: 12 U/L — AB (ref 17–63)
ALT: 13 U/L — AB (ref 17–63)
AST: 17 U/L (ref 15–41)
AST: 20 U/L (ref 15–41)
Albumin: 3.3 g/dL — ABNORMAL LOW (ref 3.5–5.0)
Albumin: 3.5 g/dL (ref 3.5–5.0)
Anion gap: 9 (ref 5–15)
Anion gap: 10 (ref 5–15)
BILIRUBIN TOTAL: 0.5 mg/dL (ref 0.3–1.2)
BUN: 41 mg/dL — AB (ref 6–20)
BUN: 33 mg/dL — AB (ref 6–20)
CALCIUM: 8.6 mg/dL — AB (ref 8.9–10.3)
CALCIUM: 8.5 mg/dL — AB (ref 8.9–10.3)
CHLORIDE: 110 mmol/L (ref 101–111)
CO2: 22 mmol/L (ref 22–32)
CO2: 24 mmol/L (ref 22–32)
CREATININE: 1.3 mg/dL — AB (ref 0.61–1.24)
CREATININE: 0.88 mg/dL (ref 0.61–1.24)
Chloride: 105 mmol/L (ref 101–111)
GFR calc non Af Amer: 53 mL/min — ABNORMAL LOW (ref >60)
Glucose, Bld: 99 mg/dL (ref 65–99)
Glucose, Bld: 100 mg/dL — ABNORMAL HIGH (ref 65–99)
Potassium: 5.5 mmol/L — ABNORMAL HIGH (ref 3.5–5.1)
Potassium: 4.7 mmol/L (ref 3.5–5.1)
Sodium: 141 mmol/L (ref 135–145)
Sodium: 139 mmol/L (ref 135–145)
TOTAL PROTEIN: 6.8 g/dL (ref 6.5–8.1)
Total Bilirubin: 0.6 mg/dL (ref 0.3–1.2)
Total Protein: 6.5 g/dL (ref 6.5–8.1)

## 2017-01-31 LAB — CBC WITH DIFFERENTIAL/PLATELET
Basophils Absolute: 0 10*3/uL (ref 0.0–0.1)
Basophils Relative: 1 %
EOS PCT: 1 %
Eosinophils Absolute: 0.1 10*3/uL (ref 0.0–0.7)
HEMATOCRIT: 34.5 % — AB (ref 39.0–52.0)
HEMOGLOBIN: 11 g/dL — AB (ref 13.0–17.0)
LYMPHS ABS: 1.8 10*3/uL (ref 0.7–4.0)
Lymphocytes Relative: 41 %
MCH: 27 pg (ref 26.0–34.0)
MCHC: 31.9 g/dL (ref 30.0–36.0)
MCV: 84.8 fL (ref 78.0–100.0)
MONOS PCT: 12 %
Monocytes Absolute: 0.5 10*3/uL (ref 0.1–1.0)
Neutro Abs: 2 10*3/uL (ref 1.7–7.7)
Neutrophils Relative %: 45 %
PLATELETS: 198 10*3/uL (ref 150–400)
RBC: 4.07 MIL/uL — AB (ref 4.22–5.81)
RDW: 16.5 % — ABNORMAL HIGH (ref 11.5–15.5)
WBC: 4.4 10*3/uL (ref 4.0–10.5)

## 2017-01-31 LAB — CBC
HCT: 35 % — ABNORMAL LOW (ref 39.0–52.0)
Hemoglobin: 11.1 g/dL — ABNORMAL LOW (ref 13.0–17.0)
MCH: 26.4 pg (ref 26.0–34.0)
MCHC: 31.7 g/dL (ref 30.0–36.0)
MCV: 83.1 fL (ref 78.0–100.0)
PLATELETS: 222 10*3/uL (ref 150–400)
RBC: 4.21 MIL/uL — AB (ref 4.22–5.81)
RDW: 16.7 % — AB (ref 11.5–15.5)
WBC: 5.1 10*3/uL (ref 4.0–10.5)

## 2017-01-31 MED ORDER — RANITIDINE HCL 150 MG PO TABS
150.0000 mg | ORAL_TABLET | Freq: Two times a day (BID) | ORAL | 0 refills | Status: DC
Start: 2017-01-31 — End: 2017-02-01

## 2017-01-31 MED ORDER — PANTOPRAZOLE SODIUM 40 MG PO TBEC: 40.0000 mg | DELAYED_RELEASE_TABLET | Freq: Two times a day (BID) | ORAL | 3 refills | Status: DC

## 2017-01-31 MED ORDER — IOPAMIDOL (ISOVUE-300) INJECTION 61%
100.0000 mL | Freq: Once | INTRAVENOUS | Status: AC | PRN
  Administered 2017-01-31: 100 mL via INTRAVENOUS

## 2017-01-31 NOTE — ED Triage Notes (Signed)
Pt comes in with abdominal pain for 2 months with diarrhea. States he was seen by gastro today and told he had blood in his stool. [facility] and waited 3 hours before coming here. He has been told several times he need and endoscopy but can't get an appt for several weeks. The blood in his stool is a new finding. Pt BP normal at this time.

## 2017-01-31 NOTE — ED Provider Notes (Signed)
Emergency Department Provider Note   I have reviewed the triage vital signs and the nursing notes.   HISTORY  Chief Complaint Abdominal Pain   HPI Eduardo [name] is a 71 y.o. male who has approximately 2 months of progressively worsening abdominal pain. He is seeing GI twice and his primary doctor multiple times and been in the emergency department a couple times as well. Patient states that today the pain is worse and it was 5 days ago. He had a low blood pressure and Hemoccult positive stools at the GI doctor's office. He presented to [facility] and left before being seen secondary to wait time. Here he states the pain is still there and he also has abdominal distention. Still notes dark stools. He does state he is taking medications for ulcers and has taken antibiotics for this in the past as he was H. Pylori positive. States the pain is usually around 6 and recently has got to the point where that 9. No change in bowel movements. Has been taking his medications as prescribed. Some nausea intermittently but no vomiting. No headaches, vision changes, rash, urinary changes. No other associated modifying symptoms.   Past Medical History:  Diagnosis Date  . Arthritis   . Bilateral carotid artery disease ([place]) 04/28/2015   1-39 percent bilateral stenosis noted on Doppler   . CAD (coronary artery disease)   . Cervical disc disease   . Chemical exposure    agent orange   . COPD (chronic obstructive pulmonary disease) ([place])   . Diabetes mellitus without complication ([place])   . Diverticulitis   . Hypertension   . Lumbar disc disease   . Obesity (BMI 30-39.9)   . Peripheral neuropathy    in all extremities  . PTSD (post-traumatic stress disorder)   . Sleep apnea    can't wear cpap  . Thoracic ascending aortic aneurysm [facility])     Patient Active Problem List   Diagnosis Date Noted  . Expressive aphasia 07/15/2016  . Chest pain 09/26/2015  . Elevated TSH  05/08/2015  . S/P CABG x 4   . Bilateral carotid artery disease ([place]) 04/28/2015  . BMI 28.0-28.9,adult   . Unstable angina ([place]) 03/18/2014  . Type 2 diabetes mellitus without complication, without long-term current use of insulin ([place]) 09/21/2013  . COPD (chronic obstructive pulmonary disease) ([place]) 09/21/2013  . PTSD (post-traumatic stress disorder) 09/21/2013  . CAD (coronary artery disease) 04/16/2013  . HTN (hypertension) 04/16/2013  . Hyperlipidemia 04/16/2013    Past Surgical History:  Procedure Laterality Date  . BACK SURGERY    . CARDIAC CATHETERIZATION    . CARDIAC CATHETERIZATION  03/18/14   difficult to determine culprit vesel  . CARDIAC CATHETERIZATION N/A 04/29/2015   Procedure: Left Heart Cath and Coronary Angiography;  Surgeon: [name], MD;  Location: [place] CV LAB;  Service: Cardiovascular;  Laterality: N/A;  . CARPOMETACARPAL ([place]) FUSION OF THUMB Right 08/04/2016   Procedure: CARPOMETACARPAL [facility]) FUSION OF THUMB;  Surgeon: Iran Planas, MD;  Location: [place];  Service: Orthopedics;  Laterality: Right;  . CERVICAL SPINE SURGERY    . CHOLECYSTECTOMY    . CORONARY ARTERY BYPASS GRAFT N/A 05/04/2015   Procedure: CORONARY ARTERY BYPASS GRAFTING (CABG)  x four,  using left internal mammary artery, and bilateral thigh greater saphenous veins;  Surgeon: [name], MD;  Location: [place];  Service: Open Heart Surgery;  Laterality: N/A;  . EYE SURGERY Right    cataract  .  I&D EXTREMITY Left 05/30/2013   Procedure: IRRIGATION AND DEBRIDEMENT Left Hand and Foreign body removal;  Surgeon: [name], MD;  Location: [place];  Service: Orthopedics;  Laterality: Left;  . JOINT REPLACEMENT    . LEFT HEART CATHETERIZATION WITH CORONARY ANGIOGRAM N/A 03/18/2014   Procedure: LEFT HEART CATHETERIZATION WITH CORONARY ANGIOGRAM;  Surgeon: [name], MD;  Location: [facility] CATH LAB;  Service: Cardiovascular;  Laterality: N/A;  . LUMBAR LAMINECTOMY    . PARTIAL  COLECTOMY     For diverticulitis  . PTCA     unsuccesful  . REPLACEMENT TOTAL KNEE     right   . SHOULDER ARTHROSCOPY    . SHOULDER SURGERY Bilateral   . TEE WITHOUT CARDIOVERSION N/A 05/04/2015   Procedure: TRANSESOPHAGEAL ECHOCARDIOGRAM (TEE);  Surgeon: [name], MD;  Location: [place];  Service: Open Heart Surgery;  Laterality: N/A;    Current Outpatient Rx  . Order #: [id number] Class: Historical Med  . Order #: [id number] Class: OTC  . Order #: [id number] Class: Historical Med  . Order #: [id number] Class: Historical Med  . Order #: [id number] Class: Historical Med  . Order #: [id number] Class: Historical Med  . Order #: [id number] Class: Historical Med  . Order #: [id number] Class: Print  . Order #: [id number] Class: Historical Med  . Order #: [id number] Class: Historical Med  . Order #: [id number] Class: Normal  . Order #: [id number] Class: Historical Med  . Order #: [id number] Class: Historical Med  . Order #: [id number] Class: Historical Med  . Order #: [id number] Class: Print    Allergies Statins and Benazepril  Family History  Problem Relation Age of Onset  . Diabetes Father   . Heart disease Father   . Hypertension Father   . Diabetes Paternal Grandmother   . Heart disease Paternal Grandmother   . Hypertension Paternal Grandmother   . Diabetes Paternal Uncle   . Heart disease Paternal Uncle   . Heart disease Paternal Uncle   . Hypertension Paternal Uncle   . Heart disease Paternal Uncle   . Hypertension Mother   . Heart disease Mother   . Drug abuse Son     Social History Social History  Substance Use Topics  . Smoking status: Former Smoker    Packs/day: 0.75    Years: 30.00    Types: Cigarettes    Start date: 01/02/1961    Quit date: 05/31/1987  . Smokeless tobacco: Never Used  . Alcohol use No    Review of Systems  All other systems negative except as documented in the HPI. All pertinent positives and negatives as reviewed in the  HPI. ____________________________________________  PHYSICAL EXAM:  VITAL SIGNS: ED Triage Vitals  Enc Vitals Group     BP 01/31/17 1834 (!) 148/79     Pulse Rate 01/31/17 1834 97     Resp 01/31/17 1834 18     Temp 01/31/17 1834 97.8 F (36.6 C)     Temp Source 01/31/17 1834 Temporal     SpO2 01/31/17 1834 99 %     Weight 01/31/17 1834 181 lb (82.1 kg)     Height 01/31/17 1834 5\' 8"  (1.727 m)     Head Circumference --      Peak Flow --      Pain Score 01/31/17 1833 8     Pain Loc --      Pain Edu? --      Excl. in [place]? --     Constitutional: Alert and oriented. Well appearing and in no acute distress. Eyes:  Conjunctivae are normal. PERRL. EOMI. Head: Atraumatic. Nose: No congestion/rhinnorhea. Mouth/Throat: Mucous membranes are moist.  Oropharynx non-erythematous. Neck: No stridor.  No meningeal signs.   Cardiovascular: Normal rate, regular rhythm. Good peripheral circulation. Grossly normal heart sounds.   Respiratory: Normal respiratory effort.  No retractions. Lungs CTAB. Gastrointestinal: Soft and diffusely tender. Positive for distention.  Musculoskeletal: No lower extremity tenderness nor edema. No gross deformities of extremities. Neurologic:  Normal speech and language. No gross focal neurologic deficits are appreciated.  Skin:  Skin is warm, dry and intact. No rash noted.  ____________________________________________   LABS (all labs ordered are listed, but only abnormal results are displayed)  Labs Reviewed  CBC WITH DIFFERENTIAL/PLATELET - Abnormal; Notable for the following:       Result Value   RBC 4.07 (*)    Hemoglobin 11.0 (*)    HCT 34.5 (*)    RDW 16.5 (*)    All other components within normal limits  COMPREHENSIVE METABOLIC PANEL - Abnormal; Notable for the following:    Glucose, Bld 100 (*)    BUN 33 (*)    Calcium 8.5 (*)    ALT 13 (*)    All other components within normal limits  H. PYLORI ANTIBODY, IGG    ____________________________________________  RADIOLOGY  Ct Abdomen Pelvis W Contrast  Result Date: 01/31/2017 CLINICAL DATA:  Abdominal distension.  Abdominal pain for 2 months. EXAM: CT ABDOMEN AND PELVIS WITH CONTRAST TECHNIQUE: Multidetector CT imaging of the abdomen and pelvis was performed using the standard protocol following bolus administration of intravenous contrast. CONTRAST:  136mL ISOVUE-300 IOPAMIDOL (ISOVUE-300) INJECTION 61% COMPARISON:  CT 6 days prior 01/25/2017 FINDINGS: Lower chest: No consolidation. No pleural fluid. Coronary artery calcifications. Hepatobiliary: No focal hepatic lesion. Layering sludge/stones within physiologically distended gallbladder. No pericholecystic inflammation. No biliary dilatation. Pancreas: No ductal dilatation or inflammation. Spleen: No focal abnormality. Upper normal in size spanning 12.9 cm. Small splenule anteriorly. Adrenals/Urinary Tract: Normal adrenal glands. No hydronephrosis or perinephric edema. Homogeneous renal enhancement with symmetric excretion on delayed phase imaging. Tiny low-density lesions in the right kidney are too small to characterize. Urinary bladder is physiologically distended. No definite bladder wall thickening. Stomach/Bowel: Lack of enteric contrast limits bowel assessment. Stomach is nondistended. No small bowel dilatation or inflammation. No bowel obstruction. Moderate colonic stool burden. Previous transverse mucosal thickening has resolved. Diverticulosis of the distal descending and sigmoid colon without diverticulitis. No bowel inflammation. Normal appendix. Vascular/Lymphatic: Aortic and branch atherosclerosis. No aneurysm. No abdominal or pelvic adenopathy. Reproductive: Prominent prostate gland spans 5.4 cm. Other: No free air, free fluid, or intra-abdominal fluid collection. Fat within both inguinal canals. Musculoskeletal: Posterior fusion L3-L4. Multilevel degenerative change in the spine. There are no acute or  suspicious osseous abnormalities. IMPRESSION: 1. Resolved transverse colon mucosal thickening. No new abnormality. No explanation for abdominal distention. 2. Mild distal colonic diverticulosis without acute inflammation. 3. Sludge and small stones in the gallbladder, no CT findings of gallbladder inflammation. 4. Aortic Atherosclerosis (ICD10-I70.0). Coronary artery calcifications. Electronically Signed   By: Jeb Levering M.D.   On: 01/31/2017 22:45   ____________________________________________   PROCEDURES  Procedure(s) performed:   Procedures ____________________________________________   INITIAL IMPRESSION / ASSESSMENT AND PLAN / ED COURSE  Pertinent labs & imaging results that were available during my care of the patient were reviewed by me and considered in my medical decision making (see chart for details).  Workup here unremarkable. No obvious explanation for her symptoms. The inflammation is: Seems to be  gone. He is going to see his primary doctor tomorrow and try to get a different GI referral. Feel like the patient likely needs a colonoscopy and if that is unproductive then possibly a laparoscopic exploration for the cause of his pain. No acute or surgical causes found here for his symptoms  ____________________________________________  FINAL CLINICAL IMPRESSION(S) / ED DIAGNOSES  Final diagnoses:  Abdominal pain, unspecified abdominal location    MEDICATIONS GIVEN DURING THIS VISIT:  Medications  iopamidol (ISOVUE-300) 61 % injection 100 mL (100 mLs Intravenous Contrast Given 01/31/17 2221)    NEW OUTPATIENT MEDICATIONS STARTED DURING THIS VISIT:  Discharge Medication List as of 01/31/2017 11:29 PM    START taking these medications   Details  ranitidine (ZANTAC) 150 MG tablet Take 1 tablet (150 mg total) by mouth 2 (two) times daily., Starting Tue 01/31/2017, Print        Note:  This document was prepared using Dragon voice recognition software and may include  unintentional dictation errors.    [name], MD 01/31/17 [phone]

## 2017-01-31 NOTE — ED Notes (Signed)
Discussed with pt orders to not take Metformin for next 48 hours, pt and spouse state they have been advised by CT staff and verbalized understanding

## 2017-01-31 NOTE — ED Notes (Signed)
Pt very angry wanting to leave stating he needs to be seen and has had this problem for two months. RN tried to explain the process and advise against leaving. Pt understands the risks. Pt left and threw his papers at [employer]

## 2017-01-31 NOTE — Progress Notes (Signed)
Subjective:    Patient ID: Eduardo [name], male    DOB: 06-Feb-1946, 71 y.o.   MRN: [id number]  HPI Referred by Dr. Assunta Found for epigastric pain. He states he has pain all over abdomen. Symptoms for about 2 months.  He point to his mid abdomen. He says his stools are loose and dark brown.  Has been on Amoxicillin and 2 other ? antibiotics Per records he had an EGD about 1  month ago at the [facility] in [place].  Had 5 ulcers. Was treated with H. Pylori.   Seen in the ED 01/25/2017 for generalized abdominal pain.  CT scna revealed no acute abnormalities. Nonspecific mild mucosal thickening of the transverse colon, usually associated with inflammatory changes. Has had weight loss.   States his stool studies have been negative.  Having 5-15 stools a day.  On 01/16/2017 covered with Flagyl for possible C. Diff.    Bood sugars are running about 100.   CABG in 2016 in [place], maintained on Plavix.  States yesterday he almost passed out, He became too hot.  Today he became light headed.   No NSAIDs except the 81mg  ASA.    CT scan 01/25/2017 revealed :   IMPRESSION: No evidence of acute abnormality within the solid abdominal organs. Nonspecific mild mucosal thickening of the transverse colon, usually associated with inflammatory changes. Calcific atherosclerotic disease of the coronary arteries and aorta. Electronically Signed   By: Fidela Salisbury M.D.   On:    CBC    Component Value Date/Time   WBC 6.7 01/25/2017 1906   RBC 4.48 01/25/2017 1906   HGB 12.3 (L) 01/25/2017 1906   HGB 11.1 (L) 11/18/2016 1713   HCT 38.1 (L) 01/25/2017 1906   HCT 33.5 (L) 11/18/2016 1713   PLT 225 01/25/2017 1906   PLT 204 11/18/2016 1713   MCV 85.0 01/25/2017 1906   MCV 92 11/18/2016 1713   MCH 27.5 01/25/2017 1906   MCHC 32.3 01/25/2017 1906   RDW 15.7 (H) 01/25/2017 1906   RDW 14.2 11/18/2016 1713   LYMPHSABS 1.8 01/25/2017 1906   LYMPHSABS 2.2 11/18/2016 1713   MONOABS 0.5 01/25/2017  1906   EOSABS 0.0 01/25/2017 1906   EOSABS 0.0 11/18/2016 1713   BASOSABS 0.0 01/25/2017 1906   BASOSABS 0.1 11/18/2016 1713      01/25/2017 20:17  Review of Systems Past Medical History:  Diagnosis Date  . Arthritis   . Bilateral carotid artery disease ([place]) 04/28/2015   1-39 percent bilateral stenosis noted on Doppler   . CAD (coronary artery disease)   . Cervical disc disease   . Chemical exposure    agent orange   . COPD (chronic obstructive pulmonary disease) ([place])   . Diabetes mellitus without complication ([place])   . Diverticulitis   . Hypertension   . Lumbar disc disease   . Obesity (BMI 30-39.9)   . Peripheral neuropathy    in all extremities  . PTSD (post-traumatic stress disorder)   . Sleep apnea    can't wear cpap  . Thoracic ascending aortic aneurysm [facility])     Past Surgical History:  Procedure Laterality Date  . BACK SURGERY    . CARDIAC CATHETERIZATION    . CARDIAC CATHETERIZATION  03/18/14   difficult to determine culprit vesel  . CARDIAC CATHETERIZATION N/A 04/29/2015   Procedure: Left Heart Cath and Coronary Angiography;  Surgeon: [name], MD;  Location: [place] CV LAB;  Service: Cardiovascular;  Laterality: N/A;  .  CARPOMETACARPAL ([facility]) FUSION OF THUMB Right 08/04/2016   Procedure: CARPOMETACARPAL [facility]) FUSION OF THUMB;  Surgeon: Iran Planas, MD;  Location: [place];  Service: Orthopedics;  Laterality: Right;  . CERVICAL SPINE SURGERY    . CHOLECYSTECTOMY    . CORONARY ARTERY BYPASS GRAFT N/A 05/04/2015   Procedure: CORONARY ARTERY BYPASS GRAFTING (CABG)  x four,  using left internal mammary artery, and bilateral thigh greater saphenous veins;  Surgeon: [name], MD;  Location: [place];  Service: Open Heart Surgery;  Laterality: N/A;  . EYE SURGERY Right    cataract  . I&D EXTREMITY Left 05/30/2013   Procedure: IRRIGATION AND DEBRIDEMENT Left Hand and Foreign body removal;  Surgeon: [name], MD;  Location: [place];  Service:  Orthopedics;  Laterality: Left;  . JOINT REPLACEMENT    . LEFT HEART CATHETERIZATION WITH CORONARY ANGIOGRAM N/A 03/18/2014   Procedure: LEFT HEART CATHETERIZATION WITH CORONARY ANGIOGRAM;  Surgeon: [name], MD;  Location: [facility] CATH LAB;  Service: Cardiovascular;  Laterality: N/A;  . LUMBAR LAMINECTOMY    . PARTIAL COLECTOMY     For diverticulitis  . PTCA     unsuccesful  . REPLACEMENT TOTAL KNEE     right   . SHOULDER ARTHROSCOPY    . SHOULDER SURGERY Bilateral   . TEE WITHOUT CARDIOVERSION N/A 05/04/2015   Procedure: TRANSESOPHAGEAL ECHOCARDIOGRAM (TEE);  Surgeon: [name], MD;  Location: [place];  Service: Open Heart Surgery;  Laterality: N/A;    Allergies  Allergen Reactions  . Statins Other (See Comments)    Body aches  . Benazepril     hyperkalemia    Current Outpatient Prescriptions on File Prior to Visit  Medication Sig Dispense Refill  . albuterol (PROVENTIL HFA;VENTOLIN HFA) 108 (90 BASE) MCG/ACT inhaler Inhale 1 puff into the lungs every 6 (six) hours as needed for wheezing or shortness of breath.     [name] aspirin EC 81 MG EC tablet Take 1 tablet (81 mg total) by mouth daily.    . carvedilol (COREG) 12.5 MG tablet Take 12.5 mg by mouth 2 (two) times daily.     . Cholecalciferol (VITAMIN D PO) Take 6,000 Units by mouth daily.     [name] dicyclomine (BENTYL) 20 MG tablet Take 1 tablet (20 mg total) by mouth 2 (two) times daily. 20 tablet 0  . ezetimibe (ZETIA) 10 MG tablet Take 10 mg by mouth at bedtime.     [name] gemfibrozil (LOPID) 600 MG tablet Take 600 mg by mouth 2 (two) times daily before a meal.    . levothyroxine (SYNTHROID, LEVOTHROID) 25 MCG tablet Take 1 tablet (25 mcg total) by mouth daily before breakfast. 30 tablet 1  . loperamide (IMODIUM) 2 MG capsule Take 2 mg by mouth as needed for diarrhea or loose stools.    . metFORMIN (GLUCOPHAGE) 1000 MG tablet Take 1,000 mg by mouth 2 (two) times daily with a meal.    . nitroGLYCERIN (NITROSTAT) 0.4 MG SL tablet  Place 0.4 mg under the tongue every 5 (five) minutes as needed for chest pain.    [name] omeprazole (PRILOSEC) 40 MG capsule Take 1 capsule (40 mg total) by mouth daily. 30 capsule 3  . ondansetron (ZOFRAN ODT) 4 MG disintegrating tablet Take 1 tablet (4 mg total) by mouth every 8 (eight) hours as needed for nausea or vomiting. 20 tablet 0  . sucralfate (CARAFATE) 1 GM/10ML suspension Take 10 mLs (1 g total) by mouth 4 (four) times daily -  with meals and at bedtime. 420 mL 0  . tetrahydrozoline 0.05 % ophthalmic solution Place 1-2 drops into both eyes 3 (three) times daily as needed (for dry/irritated eyes).     No current facility-administered medications on file prior to visit.         Objective:   Physical Exam Blood pressure (!) 92/50, pulse 64, temperature 98 F (36.7 C), height 5\' 8"  (1.727 m), weight 181 lb (82.1 kg). Alert and oriented. Skin warm and dry. Oral mucosa is moist.   . Sclera anicteric, conjunctivae is pink. Thyroid not enlarged. No cervical lymphadenopathy. Lungs clear. Heart regular rate and rhythm.  Abdomen is soft. Bowel sounds are positive. No hepatomegaly. No abdominal masses felt. No tenderness.  No edema to lower extremities.    Stool loose,brown and guaiac positive . No rectal masses felt.          Assessment & Plan:  GERD. Hx of ulcers. Stop the Omeprazole. Start Protonix BID.  GERD diet given to patient.  Continue the Carafate Diarrhea. Guaiac positive stool: GI pathogen.   I encourage patent to follow up with the [facility] in [place]. Patient became very upset in office. Felt no one was doing anything for him. Became down right irate. I advised him he needed to follow up with GI at [facility] in [place] for his gastric ulcers. I advised wife to take b/p's for a week at the same time and show Dr. Evette Doffing.,

## 2017-01-31 NOTE — Patient Instructions (Signed)
Stop the Omeprazole. Protonix 40mg  BID.

## 2017-01-31 NOTE — ED Triage Notes (Signed)
Pt to ER for continued generalized abd pain for 2 months with diarrhea. Recently seen and had CT scan without acute findings. Pt states he was seen by gastroenterology today and they told him he had blood in his stool and his BP was in the 90's. At current time pt BP 148/79. Ambulatory without difficulty. Also states his potassium is high.

## 2017-02-01 ENCOUNTER — Ambulatory Visit (INDEPENDENT_AMBULATORY_CARE_PROVIDER_SITE_OTHER): Payer: Medicare Other | Admitting: Pediatrics

## 2017-02-01 ENCOUNTER — Encounter: Payer: Self-pay | Admitting: Pediatrics

## 2017-02-01 VITALS — BP 100/66 | HR 70 | Temp 97.7°F | Ht 68.0 in | Wt 177.0 lb

## 2017-02-01 DIAGNOSIS — R1013 Epigastric pain: Secondary | ICD-10-CM

## 2017-02-01 DIAGNOSIS — I10 Essential (primary) hypertension: Secondary | ICD-10-CM | POA: Diagnosis not present

## 2017-02-01 DIAGNOSIS — R42 Dizziness and giddiness: Secondary | ICD-10-CM | POA: Diagnosis not present

## 2017-02-01 DIAGNOSIS — I6523 Occlusion and stenosis of bilateral carotid arteries: Secondary | ICD-10-CM

## 2017-02-01 NOTE — Progress Notes (Signed)
  Subjective:   Patient ID: [name], male    DOB: 07-17-1945, 71 y.o.   MRN: [id number] CC: Dizziness (almost passes out  Seen at [facility] last night) and vision blurry at times  HPI: KAMUELA MAGOS is a 71 y.o. male presenting for Dizziness (almost passes out  Seen at [facility] last night) and vision blurry at times  Seen at [facility] last night for passing out episodes Had repeat CT scan due to abd distension Hemoccult positive yesterday in GI clinic [facility] Last colonoscopy 9 yrs ago  Abd pain better today Taking PPI, sucralfate Helping some  Felt well enough to play golf over the weekend Got a little lightheaded, had to sit down Now on carvedilol 12.5mg  BID alone for BP  Has had several recent low BPs at office visits Pt says he checks at home, BP is 130s/70s usually No CP or chest pressure  Relevant past medical, surgical, family and social history reviewed. Allergies and medications reviewed and updated. History  Smoking Status  . Former Smoker  . Packs/day: 0.75  . Years: 30.00  . Types: Cigarettes  . Start date: 01/02/1961  . Quit date: 05/31/1987  Smokeless Tobacco  . Never Used   ROS: Per HPI   Objective:    BP 100/66   Pulse 70   Temp 97.7 F (36.5 C) (Oral)   Ht 5\' 8"  (1.727 m)   Wt 177 lb (80.3 kg)   BMI 26.91 kg/m   Wt Readings from Last 3 Encounters:  02/01/17 177 lb (80.3 kg)  01/31/17 181 lb (82.1 kg)  01/31/17 181 lb (82.1 kg)    Gen: NAD, alert, cooperative with exam, NCAT EYES: EOMI, no conjunctival injection, or no icterus ENT:  TMs pearly gray b/l, OP without erythema LYMPH: no cervical LAD CV: NRRR, normal S1/S2, no murmur, distal pulses 2+ b/l Resp: CTABL, no wheezes, normal WOB Abd: +BS, soft, tender with palpation epigastric area, periumbilical area, ND. no guarding or organomegaly Ext: No edema, warm Neuro: Alert and oriented, strength equal b/l UE and LE, coordination  grossly normal  Assessment & Plan:  Rayden was seen today for dizziness and vision blurry at times.  Diagnoses and all orders for this visit:  Epigastric abdominal pain Continue PPI, sucralfate, f/u with GI as scheduled Any BRB in stool will let us know Labs done yesterday, reviewed  Essential hypertension BP low today Was high in ED last night 130s SBP at home Pt to check regularly Has lost some weigh tpast few weeks with ongoing abd pain Will let me know if lightheadedness continues, will lower carvedilol dose Cont 12.5mg  BID for now  Episodic lightheadedness Likely related to above Pt did nto want to change BP med now, thinks BP is fine Will follow symptoms for me at home  Follow up plan: Return in about 3 months (around 05/03/2017). Assunta Found, MD [place]

## 2017-02-02 LAB — H. PYLORI ANTIBODY, IGG: H Pylori IgG: >9.4 Index Value — ABNORMAL HIGH (ref 0.00–0.79)

## 2017-02-13 ENCOUNTER — Encounter: Payer: Self-pay | Admitting: Pediatrics

## 2017-02-13 ENCOUNTER — Ambulatory Visit (INDEPENDENT_AMBULATORY_CARE_PROVIDER_SITE_OTHER): Payer: Medicare Other | Admitting: Pediatrics

## 2017-02-13 VITALS — BP 112/71 | HR 68 | Temp 97.5°F | Ht 68.0 in | Wt 179.0 lb

## 2017-02-13 DIAGNOSIS — R1013 Epigastric pain: Secondary | ICD-10-CM

## 2017-02-13 DIAGNOSIS — I10 Essential (primary) hypertension: Secondary | ICD-10-CM | POA: Diagnosis not present

## 2017-02-13 DIAGNOSIS — I6523 Occlusion and stenosis of bilateral carotid arteries: Secondary | ICD-10-CM | POA: Diagnosis not present

## 2017-02-13 DIAGNOSIS — E119 Type 2 diabetes mellitus without complications: Secondary | ICD-10-CM | POA: Diagnosis not present

## 2017-02-13 NOTE — Progress Notes (Signed)
  Subjective:   Patient ID: Eduardo [name], male    DOB: 28-Apr-1946, 71 y.o.   MRN: [id number] CC: Follow-up (3 month) med problems  HPI: Eduardo [name] is a 71 y.o. male presenting for Follow-up (3 month)  Has appt 10/4 at the [place] to redo upper EGD Recent diagnosis and treatment of H pylori Likely will need colonoscopy too he says abd pain much improved Eating small meals, helps with symptoms Appetite has been ok Taking PPI daily Not needing sucralfate Regular stooling, no blood or dark colors  Sometimes has slow stream of urine Emptying bladder completely he thinks Not getting up in the middle of the night Has been on flomax he thinks in the past Not much difference in urine stream Not bothering him much now  No CP, no SOB Taking BP meds daily No lightheadedness Says his BP at home has been 120s/70s His BGLs at home have been upper 80s to low 100s when he checks He is very pleased with how he is feeling now, much better than two weeks ago  Relevant past medical, surgical, family and social history reviewed. Allergies and medications reviewed and updated. History  Smoking Status  . Former Smoker  . Packs/day: 0.75  . Years: 30.00  . Types: Cigarettes  . Start date: 01/02/1961  . Quit date: 05/31/1987  Smokeless Tobacco  . Never Used   ROS: Per HPI   Objective:    BP 112/71   Pulse 68   Temp (!) 97.5 F (36.4 C) (Oral)   Ht 5\' 8"  (1.727 m)   Wt 179 lb (81.2 kg)   BMI 27.22 kg/m   Wt Readings from Last 3 Encounters:  02/13/17 179 lb (81.2 kg)  02/01/17 177 lb (80.3 kg)  01/31/17 181 lb (82.1 kg)    Gen: NAD, alert, cooperative with exam, NCAT EYES: EOMI, no conjunctival injection, or no icterus ENT:   OP without erythema CV: NRRR, normal S1/S2, no murmur, distal pulses 2+ b/l Resp: CTABL, no wheezes, normal WOB Abd: +BS, soft, NTND. no guarding or organomegaly Ext: No edema, warm Neuro: Alert and oriented, strength equal b/l UE and LE,  coordination grossly normal MSK: normal muscle bulk  Assessment & Plan:  Ceejay was seen today for follow-up med problems  Diagnoses and all orders for this visit:  Epigastric abdominal pain Improved Has f/u H pylori testing with GI at [facility] upcoming Cont PPI until follow up  Essential hypertension Well controlled, cont current meds  Type 2 diabetes mellitus without complication, without long-term current use of insulin ([place]) Well controlled, cont metformin  Follow up plan: Return in about 3 months (around 05/15/2017). Assunta Found, MD [place]

## 2017-02-15 DIAGNOSIS — M542 Cervicalgia: Secondary | ICD-10-CM | POA: Diagnosis not present

## 2017-02-15 DIAGNOSIS — M9902 Segmental and somatic dysfunction of thoracic region: Secondary | ICD-10-CM | POA: Diagnosis not present

## 2017-02-15 DIAGNOSIS — M546 Pain in thoracic spine: Secondary | ICD-10-CM | POA: Diagnosis not present

## 2017-02-15 DIAGNOSIS — M545 Low back pain: Secondary | ICD-10-CM | POA: Diagnosis not present

## 2017-02-15 DIAGNOSIS — M9903 Segmental and somatic dysfunction of lumbar region: Secondary | ICD-10-CM | POA: Diagnosis not present

## 2017-02-15 DIAGNOSIS — M9901 Segmental and somatic dysfunction of cervical region: Secondary | ICD-10-CM | POA: Diagnosis not present

## 2017-02-22 ENCOUNTER — Telehealth (INDEPENDENT_AMBULATORY_CARE_PROVIDER_SITE_OTHER): Payer: Self-pay | Admitting: *Deleted

## 2017-02-22 ENCOUNTER — Telehealth (INDEPENDENT_AMBULATORY_CARE_PROVIDER_SITE_OTHER): Payer: Self-pay | Admitting: Internal Medicine

## 2017-02-22 NOTE — Telephone Encounter (Signed)
Janett Billow with the [facility] called earlier today and lmoam inquiring about a referral that was sent.  I called her back and lmoam that the patient was seen in our office by Deberah Castle, NP on 01/31/17.  She called back and lmom wanting to know if the patient had been scheduled for a colonoscopy.  She stated that the referral was for a colonoscopy.  I told her that he had an issue going on and that warranted an office visit, that we couldn't go straight to a procedure.  I read through Terri's office note and it did not indicate that he was going to be scheduled for a colonoscopy.  Terri had noted that the patient needed to go back to the GI doctor at the [facility] in [place].  I explained this to Janett Billow and she stated that they will send him somewhere else.  [place]

## 2017-02-22 NOTE — Telephone Encounter (Signed)
Patient called in stating he was upset.  He said we said he could not have a colonoscopy by anyone at [facility].   I told him that we saw him for his epigastric pain and nothing in the note said he didn't need a colonoscopy.    The note does say he had issues and was suggested he follow up back with the [facility] since they were following him for ulcers etc.  His BP was elevated that day and was advised to follow up with that.    I told him to get the [facility] to call back here and talk with me and since he  needed one--which he is way overdue we could get this done for him--no problem.  He would like to make sure he doesn't see Terri here anymore--they do not get along and feels she is not suited to take care of his needs.  Staff is aware --chart noted.

## 2017-03-02 ENCOUNTER — Telehealth: Payer: Self-pay | Admitting: *Deleted

## 2017-03-02 NOTE — Telephone Encounter (Signed)
Closing encounter, call is on wife [place]

## 2017-03-08 ENCOUNTER — Other Ambulatory Visit (INDEPENDENT_AMBULATORY_CARE_PROVIDER_SITE_OTHER): Payer: Self-pay | Admitting: Internal Medicine

## 2017-03-08 ENCOUNTER — Telehealth (INDEPENDENT_AMBULATORY_CARE_PROVIDER_SITE_OTHER): Payer: Self-pay | Admitting: *Deleted

## 2017-03-08 ENCOUNTER — Encounter (INDEPENDENT_AMBULATORY_CARE_PROVIDER_SITE_OTHER): Payer: Self-pay | Admitting: *Deleted

## 2017-03-08 DIAGNOSIS — R197 Diarrhea, unspecified: Secondary | ICD-10-CM | POA: Insufficient documentation

## 2017-03-08 DIAGNOSIS — R195 Other fecal abnormalities: Secondary | ICD-10-CM

## 2017-03-08 MED ORDER — PEG 3350-KCL-NA BICARB-NACL 420 G PO SOLR: 4000.0000 mL | Freq: Once | ORAL | 0 refills | Status: AC

## 2017-03-08 NOTE — Telephone Encounter (Signed)
Patient is scheduled for colonoscopy 04/05/17 and needs to stop Plavi s 5 days prior -- please advise if ok to stop

## 2017-03-08 NOTE — Telephone Encounter (Signed)
Patient needs trilyte 

## 2017-03-14 NOTE — Telephone Encounter (Signed)
Patient aware.

## 2017-03-14 NOTE — Telephone Encounter (Signed)
Yes, ok to stop

## 2017-03-26 IMAGING — US US ABDOMEN COMPLETE
1 series · 13 of 25 positions shown · non-contrast
Comparison: None.

CLINICAL DATA: Chronic mid upper abdominal pain. Initial encounter.

EXAM:
ABDOMEN ULTRASOUND COMPLETE

[Series 1: us abdomen complete · 0.19mm/px · 13 of 118 slices shown]
[im 1/118]
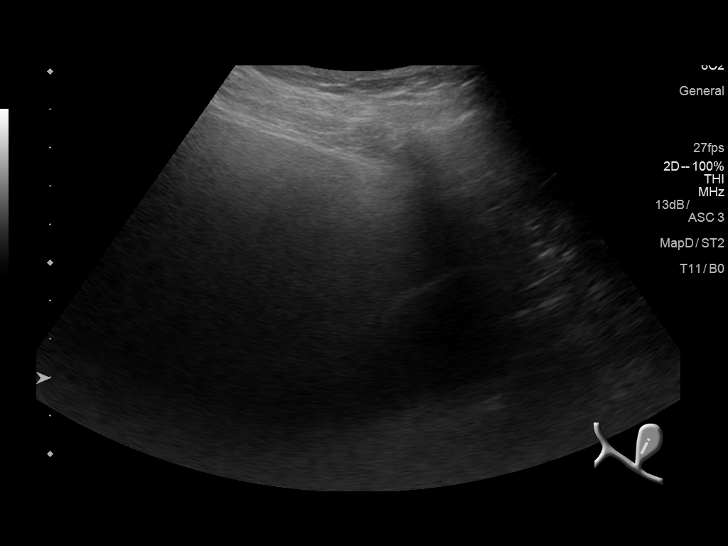
[im 10/118]
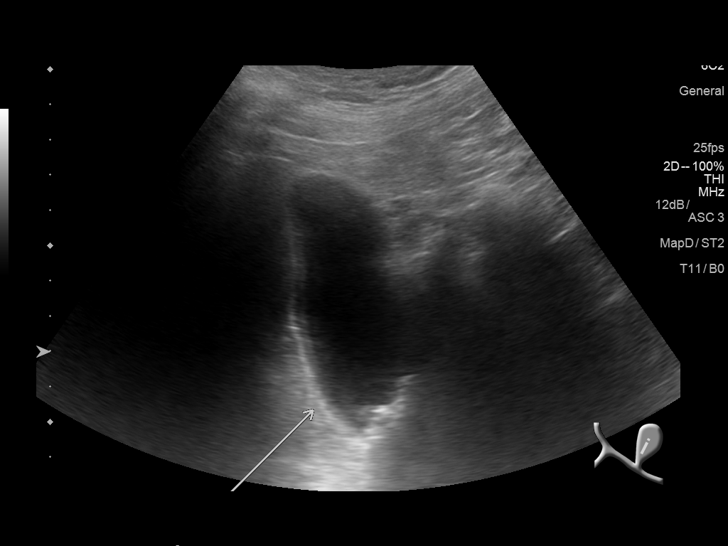
[im 20/118]
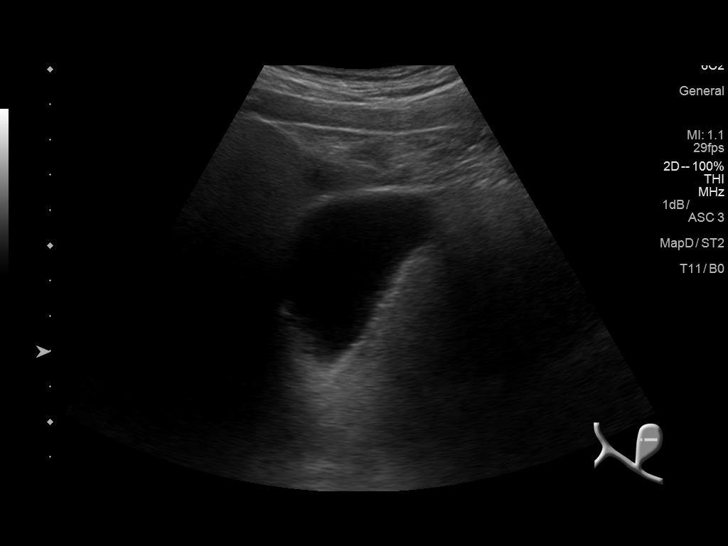
[im 30/118]
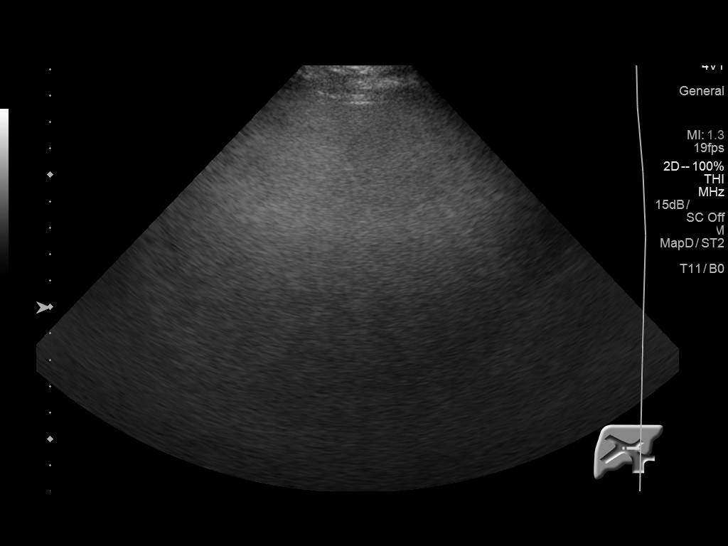
[im 40/118]
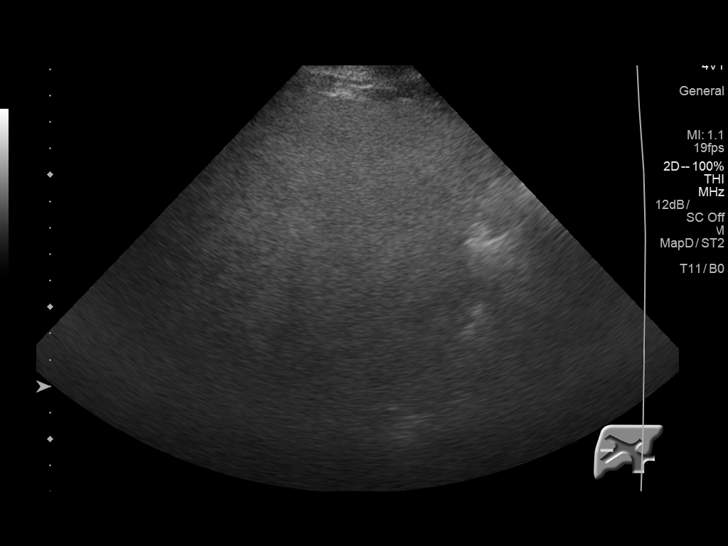
[im 49/118]
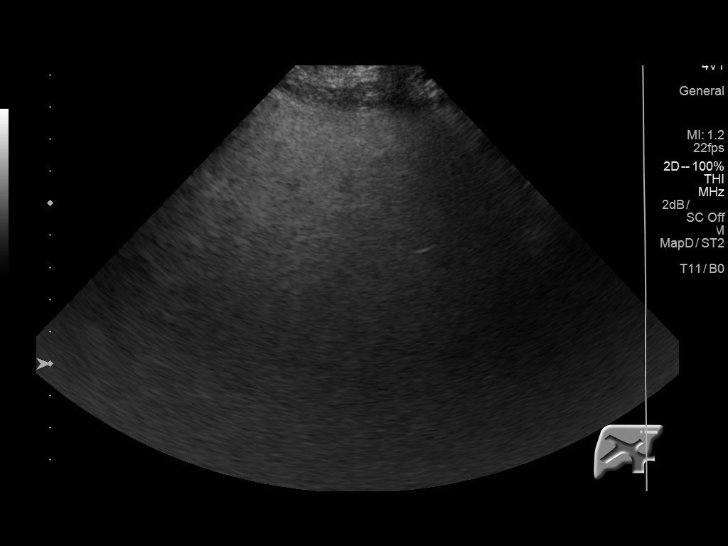
[im 59/118]
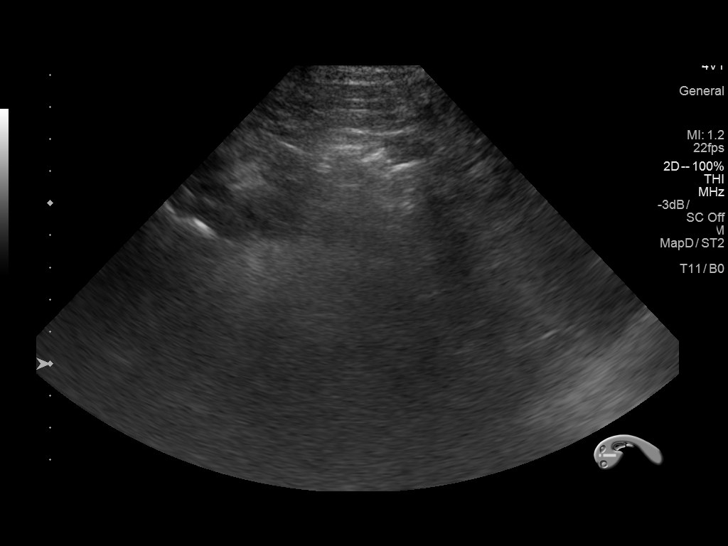
[im 69/118]
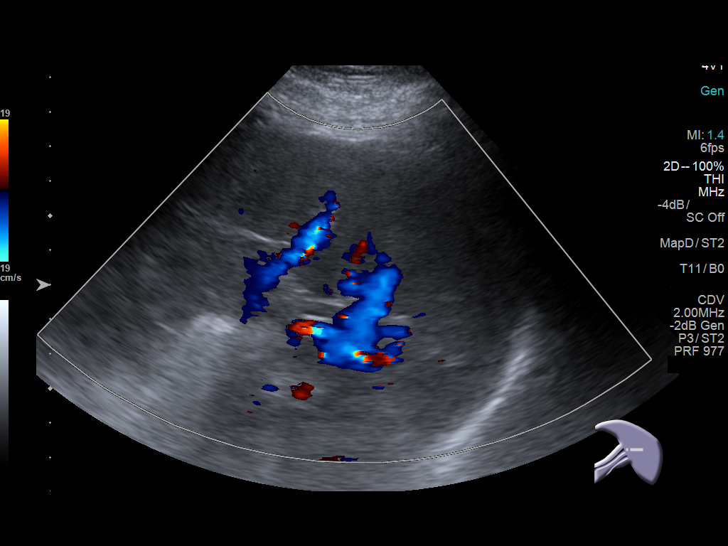
[im 79/118]
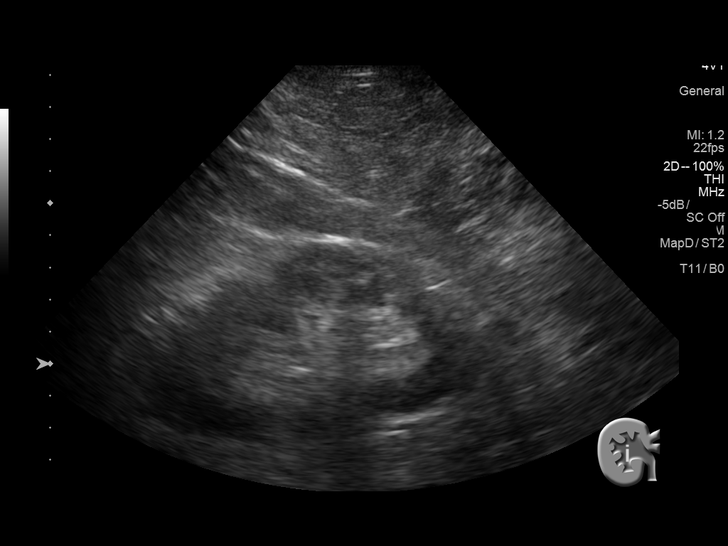
[im 88/118]
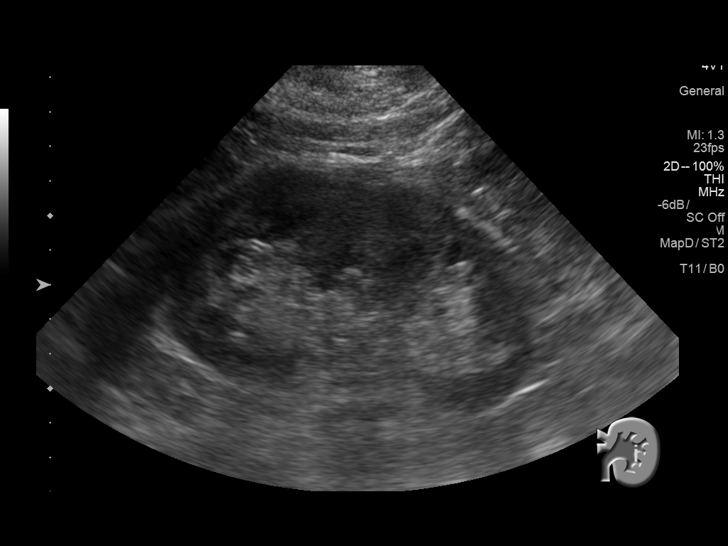
[im 98/118]
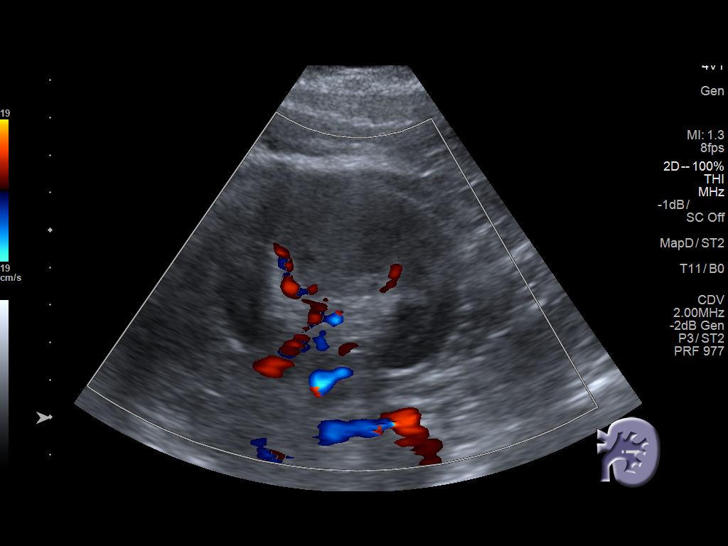
[im 108/118]
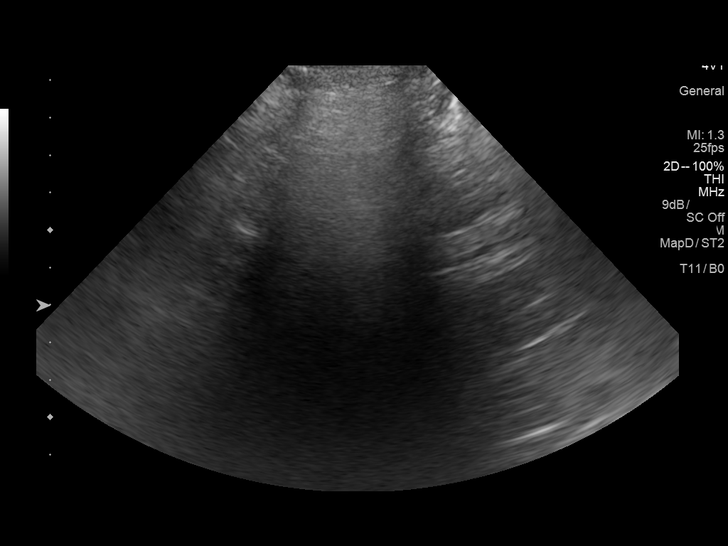
[im 118/118]
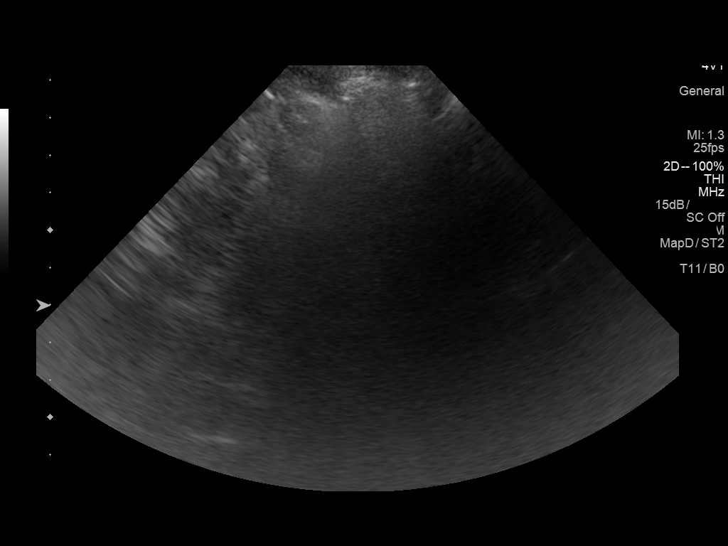

[13 of 25 positions shown; findings below may reference images not displayed]

FINDINGS: Gallbladder: No gallstones or wall thickening visualized. Mild
echogenic sludge is noted within the gallbladder. No sonographic
Murphy sign noted by sonographer.

Common bile duct: Diameter: 0.4 cm, within normal limits in caliber.

Liver: No focal lesion identified. Diffusely increased parenchymal
echogenicity and coarsened echotexture, compatible with fatty
infiltration. This limits evaluation of the liver. Difficult to
fully characterize due to overlying bowel gas.

IVC: No abnormality visualized.

Pancreas: Visualized portion unremarkable.

Spleen: Size and appearance within normal limits.

Right Kidney: Length: 11.3 cm. Borderline increased parenchymal
echogenicity is noted. No mass or hydronephrosis visualized.

Left Kidney: Length: 10.8 cm. Borderline increased parenchymal
echogenicity is noted. No mass or hydronephrosis visualized.

Abdominal aorta: No aneurysm visualized. Only partially
characterized due to overlying bowel gas.

Other findings: None.
IMPRESSION: 1. No acute abnormality seen within the abdomen. Evaluation
suboptimal due to overlying bowel gas and the patient's habitus.
2. Diffuse fatty infiltration within the liver.
3. Mild echogenic sludge within the gallbladder. Gallbladder
otherwise unremarkable in appearance.

## 2017-04-05 ENCOUNTER — Other Ambulatory Visit: Payer: Self-pay

## 2017-04-05 ENCOUNTER — Encounter (HOSPITAL_COMMUNITY)
Admission: RE | Disposition: A | Discharge status: Home / Self Care | Payer: Self-pay | Source: Ambulatory Visit | Attending: Internal Medicine

## 2017-04-05 ENCOUNTER — Ambulatory Visit (HOSPITAL_COMMUNITY)
Admission: RE | Admit: 2017-04-05 | Discharge: 2017-04-05 | Disposition: A | Discharge status: Home / Self Care | Payer: Medicare Other | Source: Ambulatory Visit | Attending: Internal Medicine | Admitting: Internal Medicine

## 2017-04-05 ENCOUNTER — Encounter (HOSPITAL_COMMUNITY): Payer: Self-pay | Admitting: *Deleted

## 2017-04-05 DIAGNOSIS — Z9049 Acquired absence of other specified parts of digestive tract: Secondary | ICD-10-CM | POA: Diagnosis not present

## 2017-04-05 DIAGNOSIS — Z7984 Long term (current) use of oral hypoglycemic drugs: Secondary | ICD-10-CM | POA: Diagnosis not present

## 2017-04-05 DIAGNOSIS — I10 Essential (primary) hypertension: Secondary | ICD-10-CM | POA: Diagnosis not present

## 2017-04-05 DIAGNOSIS — Z6826 Body mass index (BMI) 26.0-26.9, adult: Secondary | ICD-10-CM | POA: Insufficient documentation

## 2017-04-05 DIAGNOSIS — J449 Chronic obstructive pulmonary disease, unspecified: Secondary | ICD-10-CM | POA: Diagnosis not present

## 2017-04-05 DIAGNOSIS — I2510 Atherosclerotic heart disease of native coronary artery without angina pectoris: Secondary | ICD-10-CM | POA: Insufficient documentation

## 2017-04-05 DIAGNOSIS — Z7982 Long term (current) use of aspirin: Secondary | ICD-10-CM | POA: Insufficient documentation

## 2017-04-05 DIAGNOSIS — Z888 Allergy status to other drugs, medicaments and biological substances status: Secondary | ICD-10-CM | POA: Insufficient documentation

## 2017-04-05 DIAGNOSIS — K644 Residual hemorrhoidal skin tags: Secondary | ICD-10-CM | POA: Diagnosis not present

## 2017-04-05 DIAGNOSIS — G4730 Sleep apnea, unspecified: Secondary | ICD-10-CM | POA: Insufficient documentation

## 2017-04-05 DIAGNOSIS — I712 Thoracic aortic aneurysm, without rupture: Secondary | ICD-10-CM | POA: Insufficient documentation

## 2017-04-05 DIAGNOSIS — Z9842 Cataract extraction status, left eye: Secondary | ICD-10-CM | POA: Diagnosis not present

## 2017-04-05 DIAGNOSIS — Z96651 Presence of right artificial knee joint: Secondary | ICD-10-CM | POA: Insufficient documentation

## 2017-04-05 DIAGNOSIS — E1140 Type 2 diabetes mellitus with diabetic neuropathy, unspecified: Secondary | ICD-10-CM | POA: Insufficient documentation

## 2017-04-05 DIAGNOSIS — Z9889 Other specified postprocedural states: Secondary | ICD-10-CM | POA: Diagnosis not present

## 2017-04-05 DIAGNOSIS — Z833 Family history of diabetes mellitus: Secondary | ICD-10-CM | POA: Insufficient documentation

## 2017-04-05 DIAGNOSIS — Z8249 Family history of ischemic heart disease and other diseases of the circulatory system: Secondary | ICD-10-CM | POA: Insufficient documentation

## 2017-04-05 DIAGNOSIS — E669 Obesity, unspecified: Secondary | ICD-10-CM | POA: Diagnosis not present

## 2017-04-05 DIAGNOSIS — K5730 Diverticulosis of large intestine without perforation or abscess without bleeding: Secondary | ICD-10-CM | POA: Diagnosis not present

## 2017-04-05 DIAGNOSIS — Z980 Intestinal bypass and anastomosis status: Secondary | ICD-10-CM | POA: Insufficient documentation

## 2017-04-05 DIAGNOSIS — Z87891 Personal history of nicotine dependence: Secondary | ICD-10-CM | POA: Diagnosis not present

## 2017-04-05 DIAGNOSIS — Z7902 Long term (current) use of antithrombotics/antiplatelets: Secondary | ICD-10-CM | POA: Diagnosis not present

## 2017-04-05 DIAGNOSIS — Z813 Family history of other psychoactive substance abuse and dependence: Secondary | ICD-10-CM | POA: Insufficient documentation

## 2017-04-05 DIAGNOSIS — R197 Diarrhea, unspecified: Secondary | ICD-10-CM

## 2017-04-05 DIAGNOSIS — Z79899 Other long term (current) drug therapy: Secondary | ICD-10-CM | POA: Diagnosis not present

## 2017-04-05 DIAGNOSIS — R195 Other fecal abnormalities: Secondary | ICD-10-CM | POA: Diagnosis not present

## 2017-04-05 DIAGNOSIS — Z86010 Personal history of colonic polyps: Secondary | ICD-10-CM | POA: Insufficient documentation

## 2017-04-05 DIAGNOSIS — F4310 Post-traumatic stress disorder, unspecified: Secondary | ICD-10-CM | POA: Diagnosis not present

## 2017-04-05 DIAGNOSIS — Z9841 Cataract extraction status, right eye: Secondary | ICD-10-CM | POA: Diagnosis not present

## 2017-04-05 DIAGNOSIS — Z79890 Hormone replacement therapy (postmenopausal): Secondary | ICD-10-CM | POA: Insufficient documentation

## 2017-04-05 HISTORY — PX: COLONOSCOPY: SHX5424

## 2017-04-05 LAB — GLUCOSE, CAPILLARY: Glucose-Capillary: 94 mg/dL (ref 65–99)

## 2017-04-05 SURGERY — COLONOSCOPY
Anesthesia: Moderate Sedation

## 2017-04-05 MED ORDER — MIDAZOLAM HCL 5 MG/5ML IJ SOLN
INTRAMUSCULAR | Status: DC | PRN
  Administered 2017-04-05: 2 mg via INTRAVENOUS
  Administered 2017-04-05: 1 mg via INTRAVENOUS

## 2017-04-05 MED ORDER — SIMETHICONE 40 MG/0.6ML PO SUSP
ORAL | Status: DC | PRN
  Administered 2017-04-05: 08:00:00

## 2017-04-05 MED ORDER — MIDAZOLAM HCL 5 MG/5ML IJ SOLN
INTRAMUSCULAR | Status: AC
  Filled 2017-04-05: qty 10

## 2017-04-05 MED ORDER — MEPERIDINE HCL 50 MG/ML IJ SOLN
INTRAMUSCULAR | Status: DC | PRN
  Administered 2017-04-05 (×2): 25 mg via INTRAVENOUS

## 2017-04-05 MED ORDER — MEPERIDINE HCL 50 MG/ML IJ SOLN
INTRAMUSCULAR | Status: AC
  Filled 2017-04-05: qty 1

## 2017-04-05 MED ORDER — SODIUM CHLORIDE 0.9 % IV SOLN
INTRAVENOUS | Status: DC
  Administered 2017-04-05: 1000 mL via INTRAVENOUS

## 2017-04-05 NOTE — Op Note (Signed)
[facility] Patient Name: Eduardo [name] Procedure Date: 04/05/2017 8:07 AM MRN: [id number] Date of Birth: 12-Jul-1945 Attending MD: Hildred Laser , MD CSN: [id number] Age: 71 Admit Type: Outpatient Procedure:                Colonoscopy Indications:              Heme positive stool, Follow-up for history of colon                            polyps Providers:                Hildred Laser, MD, Otis Peak B. Sharon Seller, RN, Janeece Riggers, RN Referring MD:             Berlin Hun. Evette Doffing, MD Medicines:                Meperidine 50 mg IV, Midazolam 3 mg IV Complications:            No immediate complications. Estimated Blood Loss:     Estimated blood loss: none. Procedure:                Pre-Anesthesia Assessment:                           - Prior to the procedure, a History and Physical                            was performed, and patient medications and                            allergies were reviewed. The patient's tolerance of                            previous anesthesia was also reviewed. The risks                            and benefits of the procedure and the sedation                            options and risks were discussed with the patient.                            All questions were answered, and informed consent                            was obtained. Prior Anticoagulants: The patient                            last took aspirin 3 days and Plavix (clopidogrel) 5                            days prior to the procedure. ASA Grade Assessment:  III - A patient with severe systemic disease. After                            reviewing the risks and benefits, the patient was                            deemed in satisfactory condition to undergo the                            procedure.                           After obtaining informed consent, the colonoscope                            was passed under direct vision.  Throughout the                            procedure, the patient's blood pressure, pulse, and                            oxygen saturations were monitored continuously. The                            EC-[id number] ([id number]) scope was introduced through                            the anus and advanced to the the cecum, identified                            by appendiceal orifice and ileocecal valve. The                            colonoscopy was performed without difficulty. The                            patient tolerated the procedure well. The quality                            of the bowel preparation was good. The ileocecal                            valve, appendiceal orifice, and rectum were                            photographed. Scope In: 8:33:56 AM Scope Out: 8:51:30 AM Scope Withdrawal Time: 0 hours 11 minutes 15 seconds  Total Procedure Duration: 0 hours 17 minutes 34 seconds  Findings:      The perianal and digital rectal examinations were normal.      A few small-mouthed diverticula were found in the sigmoid colon.      There was evidence of a prior end-to-end colo-colonic anastomosis at 25       cm proximal to the anus. This was patent.      External hemorrhoids  were found during retroflexion. The hemorrhoids       were small. Impression:               - Diverticulosis in the sigmoid colon.                           - Patent end-to-end colo-colonic anastomosis.                           - External hemorrhoids.                           - No specimens collected. Moderate Sedation:      Moderate (conscious) sedation was administered by the endoscopy nurse       and supervised by the endoscopist. The following parameters were       monitored: oxygen saturation, heart rate, blood pressure, CO2       capnography and response to care. Total physician intraservice time was       22 minutes. Recommendation:           - Patient has a contact number available for                             emergencies. The signs and symptoms of potential                            delayed complications were discussed with the                            patient. Return to normal activities tomorrow.                            Written discharge instructions were provided to the                            patient.                           - High fiber diet today.                           - Continue present medications.                           - Resume aspirin today and Plavix (clopidogrel)                            today at prior doses.                           - May consider repeat colonoscopy in 5 years for                            surveillance. Procedure Code(s):        --- Professional ---  [phone], Colonoscopy, flexible; diagnostic, including                            collection of specimen(s) by brushing or washing,                            when performed (separate procedure)                           99152, Moderate sedation services provided by the                            same physician or other qualified health care                            professional performing the diagnostic or                            therapeutic service that the sedation supports,                            requiring the presence of an independent trained                            observer to assist in the monitoring of the                            patient's level of consciousness and physiological                            status; initial 15 minutes of intraservice time,                            patient age 25 years or older Diagnosis Code(s):        --- Professional ---                           K64.4, Residual hemorrhoidal skin tags                           Z98.0, Intestinal bypass and anastomosis status                           R19.5, Other fecal abnormalities                           Z86.010, Personal history of colonic polyps                            K57.30, Diverticulosis of large intestine without                            perforation or abscess without bleeding CPT copyright 2016 American Medical Association. All rights reserved. The codes documented in this report are preliminary and upon coder review may  be revised to meet  current compliance requirements. Hildred Laser, MD Hildred Laser, MD 04/05/2017 9:02:06 AM This report has been signed electronically. Number of Addenda: 0

## 2017-04-05 NOTE — Discharge Instructions (Signed)
Resume usual medications including clopidogrel and aspirin as before.[name] Resume usual diet. No driving for 24 hours. May consider another exam in 5 years.    Colonoscopy, Adult, Care After This sheet gives you information about how to care for yourself after your procedure. Your doctor may also give you more specific instructions. If you have problems or questions, call your doctor. Follow these instructions at home: General instructions   For the first 24 hours after the procedure: ? Do not drive or use machinery. ? Do not sign important documents. ? Do not drink alcohol. ? Do your daily activities more slowly than normal. ? Eat foods that are soft and easy to digest. ? Rest often.  Take over-the-counter or prescription medicines only as told by your doctor.  It is up to you to get the results of your procedure. Ask your doctor, or the department performing the procedure, when your results will be ready. To help cramping and bloating:  Try walking around.  Put heat on your belly (abdomen) as told by your doctor. Use a heat source that your doctor recommends, such as a moist heat pack or a heating pad. ? Put a towel between your skin and the heat source. ? Leave the heat on for 20-30 minutes. ? Remove the heat if your skin turns bright red. This is especially important if you cannot feel pain, heat, or cold. You can get burned. Eating and drinking  Drink enough fluid to keep your pee (urine) clear or pale yellow.  Return to your normal diet as told by your doctor. Avoid heavy or fried foods that are hard to digest.  Avoid drinking alcohol for as long as told by your doctor. Contact a doctor if:  You have blood in your poop (stool) 2-3 days after the procedure. Get help right away if:  You have more than a small amount of blood in your poop.  You see large clumps of tissue (blood clots) in your poop.  Your belly is swollen.  You feel sick to your stomach  (nauseous).  You throw up (vomit).  You have a fever.  You have belly pain that gets worse, and medicine does not help your pain. This information is not intended to replace advice given to you by your health care provider. Make sure you discuss any questions you have with your health care provider. Document Released: 06/18/2010 Document Revised: 02/08/2016 Document Reviewed: 02/08/2016 Elsevier Interactive Patient Education  2017 [employer].    Diverticulosis Diverticulosis is a condition that develops when small pouches (diverticula) form in the wall of the large intestine (colon). The colon is where water is absorbed and stool is formed. The pouches form when the inside layer of the colon pushes through weak spots in the outer layers of the colon. You may have a few pouches or many of them. What are the causes? The cause of this condition is not known. What increases the risk? The following factors may make you more likely to develop this condition:  Being older than age 20. Your risk for this condition increases with age. Diverticulosis is rare among people younger than age 28. By age 46, many people have it.  Eating a low-fiber diet.  Having frequent constipation.  Being overweight.  Not getting enough exercise.  Smoking.  Taking over-the-counter pain medicines, like aspirin and ibuprofen.  Having a family history of diverticulosis.  What are the signs or symptoms? In most people, there are no symptoms of this condition.  If you do have symptoms, they may include:  Bloating.  Cramps in the abdomen.  Constipation or diarrhea.  Pain in the lower left side of the abdomen.  How is this diagnosed? This condition is most often diagnosed during an exam for other colon problems. Because diverticulosis usually has no symptoms, it often cannot be diagnosed independently. This condition may be diagnosed by:  Using a flexible scope to examine the colon  (colonoscopy).  Taking an X-ray of the colon after dye has been put into the colon (barium enema).  Doing a CT scan.  How is this treated? You may not need treatment for this condition if you have never developed an infection related to diverticulosis. If you have had an infection before, treatment may include:  Eating a high-fiber diet. This may include eating more fruits, vegetables, and grains.  Taking a fiber supplement.  Taking a live bacteria supplement (probiotic).  Taking medicine to relax your colon.  Taking antibiotic medicines.  Follow these instructions at home:  Drink 6-8 glasses of water or more each day to prevent constipation.  Try not to strain when you have a bowel movement.  If you have had an infection before: ? Eat more fiber as directed by your health care provider or your diet and nutrition specialist (dietitian). ? Take a fiber supplement or probiotic, if your health care provider approves.  Take over-the-counter and prescription medicines only as told by your health care provider.  If you were prescribed an antibiotic, take it as told by your health care provider. Do not stop taking the antibiotic even if you start to feel better.  Keep all follow-up visits as told by your health care provider. This is important. Contact a health care provider if:  You have pain in your abdomen.  You have bloating.  You have cramps.  You have not had a bowel movement in 3 days. Get help right away if:  Your pain gets worse.  Your bloating becomes very bad.  You have a fever or chills, and your symptoms suddenly get worse.  You vomit.  You have bowel movements that are bloody or black.  You have bleeding from your rectum. Summary  Diverticulosis is a condition that develops when small pouches (diverticula) form in the wall of the large intestine (colon).  You may have a few pouches or many of them.  This condition is most often diagnosed during an  exam for other colon problems.  If you have had an infection related to diverticulosis, treatment may include increasing the fiber in your diet, taking supplements, or taking medicines. This information is not intended to replace advice given to you by your health care provider. Make sure you discuss any questions you have with your health care provider. Document Released: 02/11/2004 Document Revised: 04/04/2016 Document Reviewed: 04/04/2016 Elsevier Interactive Patient Education  2017 [place].    Hemorrhoids Hemorrhoids are swollen veins in and around the rectum or anus. There are two types of hemorrhoids:  Internal hemorrhoids. These occur in the veins that are just inside the rectum. They may poke through to the outside and become irritated and painful.  External hemorrhoids. These occur in the veins that are outside of the anus and can be felt as a painful swelling or hard lump near the anus.  Most hemorrhoids do not cause serious problems, and they can be managed with home treatments such as diet and lifestyle changes. If home treatments do not help your symptoms, procedures can  be done to shrink or remove the hemorrhoids. What are the causes? This condition is caused by increased pressure in the anal area. This pressure may result from various things, including:  Constipation.  Straining to have a bowel movement.  Diarrhea.  Pregnancy.  Obesity.  Sitting for long periods of time.  Heavy lifting or other activity that causes you to strain.  Anal sex.  What are the signs or symptoms? Symptoms of this condition include:  Pain.  Anal itching or irritation.  Rectal bleeding.  Leakage of stool (feces).  Anal swelling.  One or more lumps around the anus.  How is this diagnosed? This condition can often be diagnosed through a visual exam. Other exams or tests may also be done, such as:  Examination of the rectal area with a gloved hand (digital rectal  exam).  Examination of the anal canal using a small tube (anoscope).  A blood test, if you have lost a significant amount of blood.  A test to look inside the colon (sigmoidoscopy or colonoscopy).  How is this treated? This condition can usually be treated at home. However, various procedures may be done if dietary changes, lifestyle changes, and other home treatments do not help your symptoms. These procedures can help make the hemorrhoids smaller or remove them completely. Some of these procedures involve surgery, and others do not. Common procedures include:  Rubber band ligation. Rubber bands are placed at the base of the hemorrhoids to cut off the blood supply to them.  Sclerotherapy. Medicine is injected into the hemorrhoids to shrink them.  Infrared coagulation. A type of light energy is used to get rid of the hemorrhoids.  Hemorrhoidectomy surgery. The hemorrhoids are surgically removed, and the veins that supply them are tied off.  Stapled hemorrhoidopexy surgery. A circular stapling device is used to remove the hemorrhoids and use staples to cut off the blood supply to them.  Follow these instructions at home: Eating and drinking  Eat foods that have a lot of fiber in them, such as whole grains, beans, nuts, fruits, and vegetables. Ask your health care provider about taking products that have added fiber (fiber supplements).  Drink enough fluid to keep your urine clear or pale yellow. Managing pain and swelling  Take warm sitz baths for 20 minutes, 3-4 times a day to ease pain and discomfort.  If directed, apply ice to the affected area. Using ice packs between sitz baths may be helpful. ? Put ice in a plastic bag. ? Place a towel between your skin and the bag. ? Leave the ice on for 20 minutes, 2-3 times a day. General instructions  Take over-the-counter and prescription medicines only as told by your health care provider.  Use medicated creams or suppositories as  told.  Exercise regularly.  Go to the bathroom when you have the urge to have a bowel movement. Do not wait.  Avoid straining to have bowel movements.  Keep the anal area dry and clean. Use wet toilet paper or moist towelettes after a bowel movement.  Do not sit on the toilet for long periods of time. This increases blood pooling and pain. Contact a health care provider if:  You have increasing pain and swelling that are not controlled by treatment or medicine.  You have uncontrolled bleeding.  You have difficulty having a bowel movement, or you are unable to have a bowel movement.  You have pain or inflammation outside the area of the hemorrhoids. This information is not  intended to replace advice given to you by your health care provider. Make sure you discuss any questions you have with your health care provider. Document Released: 05/13/2000 Document Revised: 10/14/2015 Document Reviewed: 01/28/2015 Elsevier Interactive Patient Education  2017 [employer].

## 2017-04-05 NOTE — H&P (Signed)
Eduardo [name] is an 71 y.o. male.   Chief Complaint: Patient is here for colonoscopy. HPI: Patient is 71 year old Caucasian male who was found to have heme positive stool.  He is therefore here for diagnostic colonoscopy.  He also gives history of having 14 inches of colon removed over 22 years ago for multiple polyps.  He states he had follow-up colonoscopy 18 months later and no more polyps are found.  However he is not had any exam since then.  He denies abdominal pain rectal bleeding or diarrhea. He has been off Plavix for 5 days and aspirin for 3 days. Family history is negative for CRC.    Past Medical History:  Diagnosis Date  . Arthritis   . Bilateral carotid artery disease ([place]) 04/28/2015   1-39 percent bilateral stenosis noted on Doppler   . CAD (coronary artery disease)   . Cervical disc disease   . Chemical exposure    agent orange   . COPD (chronic obstructive pulmonary disease) ([place])   . Diabetes mellitus without complication ([place])   . Diverticulitis   . Hypertension   . Lumbar disc disease   . Obesity (BMI 30-39.9)   . Peripheral neuropathy    in all extremities  . PTSD (post-traumatic stress disorder)   . Sleep apnea    can't wear cpap  . Thoracic ascending aortic aneurysm [facility])     Past Surgical History:  Procedure Laterality Date  . BACK SURGERY    . CARDIAC CATHETERIZATION    . CARDIAC CATHETERIZATION  03/18/14   difficult to determine culprit vesel  . CERVICAL SPINE SURGERY    . CHOLECYSTECTOMY    . EYE SURGERY Bilateral    cataract  . JOINT REPLACEMENT    . LUMBAR LAMINECTOMY    . PARTIAL COLECTOMY     For diverticulitis  . PTCA     unsuccesful  . REPLACEMENT TOTAL KNEE     right   . SHOULDER ARTHROSCOPY    . SHOULDER SURGERY Bilateral     Family History  Problem Relation Age of Onset  . Diabetes Father   . Heart disease Father   . Hypertension Father   . Diabetes Paternal Grandmother   . Heart disease Paternal Grandmother   .  Hypertension Paternal Grandmother   . Diabetes Paternal Uncle   . Heart disease Paternal Uncle   . Heart disease Paternal Uncle   . Hypertension Paternal Uncle   . Heart disease Paternal Uncle   . Hypertension Mother   . Heart disease Mother   . Drug abuse Son    Social History:  reports that he quit smoking about 29 years ago. His smoking use included cigarettes. He started smoking about 56 years ago. He has a 22.50 pack-year smoking history. he has never used smokeless tobacco. He reports that he does not drink alcohol or use drugs.  Allergies:  Allergies  Allergen Reactions  . Statins Other (See Comments)    Body aches  . Benazepril     hyperkalemia    Medications Prior to Admission  Medication Sig Dispense Refill  . aspirin EC 81 MG EC tablet Take 1 tablet (81 mg total) by mouth daily.    . Cholecalciferol (VITAMIN D3) 5000 units CAPS Take 5,000 Units by mouth daily.    . clopidogrel (PLAVIX) 75 MG tablet Take 75 mg by mouth daily.    . DULoxetine (CYMBALTA) 20 MG capsule Take 20 mg by mouth 2 (two) times daily.    [name]  ezetimibe (ZETIA) 10 MG tablet Take 10 mg by mouth at bedtime.     [name] gemfibrozil (LOPID) 600 MG tablet Take 600 mg by mouth 2 (two) times daily before a meal.    . metFORMIN (GLUCOPHAGE) 1000 MG tablet Take 1,000 mg by mouth 2 (two) times daily with a meal.    . naproxen sodium (ALEVE) 220 MG tablet Take 440 mg by mouth daily as needed (pain).    [name] tetrahydrozoline 0.05 % ophthalmic solution Place 1-2 drops into both eyes 3 (three) times daily as needed (for dry/irritated eyes).    [name] albuterol (PROVENTIL HFA;VENTOLIN HFA) 108 (90 BASE) MCG/ACT inhaler Inhale 1 puff into the lungs every 6 (six) hours as needed for wheezing or shortness of breath.     . carvedilol (COREG) 12.5 MG tablet Take 12.5 mg by mouth 2 (two) times daily.     [name] levothyroxine (SYNTHROID, LEVOTHROID) 25 MCG tablet Take 1 tablet (25 mcg total) by mouth daily before breakfast. (Patient not taking:  Reported on 03/29/2017) 30 tablet 1  . nitroGLYCERIN (NITROSTAT) 0.4 MG SL tablet Place 0.4 mg under the tongue every 5 (five) minutes as needed for chest pain.    [name] omeprazole (PRILOSEC) 20 MG capsule Take 20 mg by mouth daily.    [name] omeprazole (PRILOSEC) 40 MG capsule Take 1 capsule (40 mg total) by mouth daily. (Patient not taking: Reported on 03/29/2017) 30 capsule 3    Results for orders placed or performed during the hospital encounter of 04/05/17 (from the past 48 hour(s))  Glucose, capillary     Status: None   Collection Time: 04/05/17  7:53 AM  Result Value Ref Range   Glucose-Capillary 94 65 - 99 mg/dL   No results found.  ROS  Blood pressure 137/77, pulse (!) 56, temperature (!) 97.5 F (36.4 C), temperature source Oral, resp. rate 10, height 5\' 7"  (1.702 m), weight 172 lb (78 kg), SpO2 97 %. Physical Exam  Constitutional: He appears well-developed and well-nourished.  HENT:  Mouth/Throat: Oropharynx is clear and moist.  Eyes: Conjunctivae are normal. No scleral icterus.  Neck: No thyromegaly present.  Cardiovascular: Normal rate, regular rhythm and normal heart sounds.  No murmur heard. Respiratory: Effort normal and breath sounds normal.  GI:  Abdomen is full with long midline scar.  It is soft and nontender without organomegaly or masses.  Musculoskeletal: He exhibits no edema.  Lymphadenopathy:    He has no cervical adenopathy.  Neurological: He is alert.  Skin: Skin is warm and dry.     Assessment/Plan Heme positive stool. History of colonic adenomas. Diagnostic colonoscopy.  Hildred Laser, MD 04/05/2017, 8:24 AM

## 2017-04-07 ENCOUNTER — Encounter (HOSPITAL_COMMUNITY): Payer: Self-pay | Admitting: Internal Medicine

## 2017-04-11 DIAGNOSIS — M9902 Segmental and somatic dysfunction of thoracic region: Secondary | ICD-10-CM | POA: Diagnosis not present

## 2017-04-11 DIAGNOSIS — M545 Low back pain: Secondary | ICD-10-CM | POA: Diagnosis not present

## 2017-04-11 DIAGNOSIS — M546 Pain in thoracic spine: Secondary | ICD-10-CM | POA: Diagnosis not present

## 2017-04-11 DIAGNOSIS — M9903 Segmental and somatic dysfunction of lumbar region: Secondary | ICD-10-CM | POA: Diagnosis not present

## 2017-04-11 DIAGNOSIS — M542 Cervicalgia: Secondary | ICD-10-CM | POA: Diagnosis not present

## 2017-04-11 DIAGNOSIS — M9901 Segmental and somatic dysfunction of cervical region: Secondary | ICD-10-CM | POA: Diagnosis not present

## 2017-04-27 ENCOUNTER — Ambulatory Visit (INDEPENDENT_AMBULATORY_CARE_PROVIDER_SITE_OTHER): Payer: Medicare Other | Admitting: Pediatrics

## 2017-04-27 ENCOUNTER — Encounter: Payer: Self-pay | Admitting: Pediatrics

## 2017-04-27 VITALS — BP 127/74 | HR 71 | Temp 98.4°F | Ht 67.0 in | Wt 186.2 lb

## 2017-04-27 DIAGNOSIS — R05 Cough: Secondary | ICD-10-CM

## 2017-04-27 DIAGNOSIS — I6523 Occlusion and stenosis of bilateral carotid arteries: Secondary | ICD-10-CM | POA: Diagnosis not present

## 2017-04-27 DIAGNOSIS — J069 Acute upper respiratory infection, unspecified: Secondary | ICD-10-CM | POA: Diagnosis not present

## 2017-04-27 DIAGNOSIS — R059 Cough, unspecified: Secondary | ICD-10-CM

## 2017-04-27 MED ORDER — CETIRIZINE HCL 10 MG PO TABS: 10.0000 mg | ORAL_TABLET | Freq: Every day | ORAL | 11 refills | Status: DC

## 2017-04-27 MED ORDER — FLUTICASONE PROPIONATE 50 MCG/ACT NA SUSP: 2.0000 | Freq: Every day | NASAL | 6 refills | Status: DC

## 2017-04-27 MED ORDER — GUAIFENESIN-CODEINE 100-10 MG/5ML PO SOLN: 5.0000 mL | Freq: Three times a day (TID) | ORAL | 0 refills | Status: DC | PRN

## 2017-04-27 NOTE — Progress Notes (Signed)
  Subjective:   Patient ID: Eduardo [name], male    DOB: [birth year], 71 y.o.   MRN: [id number] CC: chest congestion (5 days, no known fever, OTC robitussin) and Cough (non productive)  HPI: Eduardo [name] is a 71 y.o. male presenting for chest congestion (5 days, no known fever, OTC robitussin) and Cough (non productive)  Cough bothering him the most Worst at night Not doing sinus rinses Dry cough No fevers Appetite is ok Some ongoing nasal congestion No sore throat No SOB, no CP  Relevant past medical, surgical, family and social history reviewed. Allergies and medications reviewed and updated. Social History   Tobacco Use  Smoking Status Former Smoker  . Packs/day: 0.75  . Years: 30.00  . Pack years: 22.50  . Types: Cigarettes  . Start date: 01/02/1961  . Last attempt to quit: 05/31/1987  . Years since quitting: 29.9  Smokeless Tobacco Never Used   ROS: Per HPI   Objective:    BP 127/74   Pulse 71   Temp 98.4 F (36.9 C) (Oral)   Ht 5\' 7"  (1.702 m)   Wt 186 lb 3.2 oz (84.5 kg)   BMI 29.16 kg/m   Wt Readings from Last 3 Encounters:  04/27/17 186 lb 3.2 oz (84.5 kg)  04/05/17 172 lb (78 kg)  02/13/17 179 lb (81.2 kg)    Gen: NAD, alert, cooperative with exam, NCAT EYES: EOMI, no conjunctival injection, or no icterus ENT:  TMs pearly gray b/l, OP with mild erythema LYMPH: no cervical LAD CV: NRRR, normal S1/S2 Resp: CTABL, no wheezes, normal WOB Abd: +BS, soft, NTND.  Ext: No edema, warm Neuro: Alert and oriented, strength equal b/l UE and LE, coordination grossly normal  Assessment & Plan:  Eduardo [name] was seen today for chest congestion and cough.  Diagnoses and all orders for this visit:  Acute URI Cough Symptom care, return precautions discussed Below sent in -     cetirizine (ZYRTEC) 10 MG tablet; Take 1 tablet (10 mg total) by mouth daily. -     fluticasone (FLONASE) 50 MCG/ACT nasal spray; Place 2 sprays into both nostrils daily. -      guaiFENesin-codeine 100-10 MG/5ML syrup; Take 5-10 mLs by mouth 3 (three) times daily as needed for cough.   Follow up plan: As scheduled Assunta Found, MD [place]

## 2017-04-28 ENCOUNTER — Encounter: Payer: Self-pay | Admitting: Pediatrics

## 2017-05-16 ENCOUNTER — Ambulatory Visit: Payer: Medicare Other | Admitting: Pediatrics

## 2017-05-24 ENCOUNTER — Encounter: Payer: Self-pay | Admitting: Family Medicine

## 2017-05-24 ENCOUNTER — Ambulatory Visit (INDEPENDENT_AMBULATORY_CARE_PROVIDER_SITE_OTHER): Payer: Medicare Other | Admitting: Family Medicine

## 2017-05-24 VITALS — BP 132/76 | HR 69 | Temp 97.1°F | Ht 67.0 in | Wt 184.0 lb

## 2017-05-24 DIAGNOSIS — M791 Myalgia, unspecified site: Secondary | ICD-10-CM | POA: Diagnosis not present

## 2017-05-24 DIAGNOSIS — M25512 Pain in left shoulder: Secondary | ICD-10-CM

## 2017-05-24 DIAGNOSIS — R531 Weakness: Secondary | ICD-10-CM

## 2017-05-24 DIAGNOSIS — M25511 Pain in right shoulder: Secondary | ICD-10-CM | POA: Diagnosis not present

## 2017-05-24 NOTE — Progress Notes (Signed)
Subjective: CC: muscle soreness PCP: [name], MD [id number] H Burger is a 71 y.o. male presenting to clinic today for:  1. Myalgia Patient reports that he has been having muscle aches and joint stiffness for the last 3-4 months.  He reports that he feels weaker than normal, citing that he was previously able to play golf without difficulty but over the last few months he is grown gradually weaker and is unable to drive the ball as far as he used to.  He reports that stiffness and weakness seem to be more pronounced after being seated for prolonged amount of time.  Symptoms gradually improve after moving around a bit.  He has used Aleve intermittently but notes that he does not wish to use this on a regular basis given his known heart disease.  Denies any unusual numbness or tingling.  No fatigue with overhead movements.  Denies joint swelling or falls.  No dark or frothy urine.  No new medications.  No known family history of autoimmune disease.   ROS: Per HPI  Allergies  Allergen Reactions  . Statins Other (See Comments)    Body aches  . Benazepril     hyperkalemia   Past Medical History:  Diagnosis Date  . Arthritis   . Bilateral carotid artery disease ([place]) 04/28/2015   1-39 percent bilateral stenosis noted on Doppler   . CAD (coronary artery disease)   . Cervical disc disease   . Chemical exposure    agent orange   . COPD (chronic obstructive pulmonary disease) ([place])   . Diabetes mellitus without complication ([place])   . Diverticulitis   . Hypertension   . Lumbar disc disease   . Obesity (BMI 30-39.9)   . Peripheral neuropathy    in all extremities  . PTSD (post-traumatic stress disorder)   . Sleep apnea    can't wear cpap  . Thoracic ascending aortic aneurysm ([facility])     Current Outpatient Medications:  .  albuterol (PROVENTIL HFA;VENTOLIN HFA) 108 (90 BASE) MCG/ACT inhaler, Inhale 1 puff into the lungs every 6 (six) hours as needed for wheezing or  shortness of breath. , Disp: , Rfl:  .  aspirin EC 81 MG EC tablet, Take 1 tablet (81 mg total) by mouth daily., Disp: , Rfl:  .  carvedilol (COREG) 12.5 MG tablet, Take 12.5 mg by mouth 2 (two) times daily. , Disp: , Rfl:  .  cetirizine (ZYRTEC) 10 MG tablet, Take 1 tablet (10 mg total) by mouth daily., Disp: 30 tablet, Rfl: 11 .  Cholecalciferol (VITAMIN D3) 5000 units CAPS, Take 5,000 Units by mouth daily., Disp: , Rfl:  .  clopidogrel (PLAVIX) 75 MG tablet, Take 75 mg by mouth daily., Disp: , Rfl:  .  DULoxetine (CYMBALTA) 20 MG capsule, Take 20 mg by mouth 2 (two) times daily., Disp: , Rfl:  .  ezetimibe (ZETIA) 10 MG tablet, Take 10 mg by mouth at bedtime. , Disp: , Rfl:  .  fluticasone (FLONASE) 50 MCG/ACT nasal spray, Place 2 sprays into both nostrils daily., Disp: 16 g, Rfl: 6 .  gemfibrozil (LOPID) 600 MG tablet, Take 600 mg by mouth 2 (two) times daily before a meal., Disp: , Rfl:  .  guaiFENesin-codeine 100-10 MG/5ML syrup, Take 5-10 mLs by mouth 3 (three) times daily as needed for cough., Disp: 120 mL, Rfl: 0 .  metFORMIN (GLUCOPHAGE) 1000 MG tablet, Take 1,000 mg by mouth 2 (two) times daily with a meal., Disp: , Rfl:  .  naproxen sodium (ALEVE) 220 MG tablet, Take 440 mg by mouth daily as needed (pain)., Disp: , Rfl:  .  nitroGLYCERIN (NITROSTAT) 0.4 MG SL tablet, Place 0.4 mg under the tongue every 5 (five) minutes as needed for chest pain., Disp: , Rfl:  .  omeprazole (PRILOSEC) 20 MG capsule, Take 20 mg by mouth daily., Disp: , Rfl:  .  tetrahydrozoline 0.05 % ophthalmic solution, Place 1-2 drops into both eyes 3 (three) times daily as needed (for dry/irritated eyes)., Disp: , Rfl:  Social History   Socioeconomic History  . Marital status: Married    Spouse name: Not on file  . Number of children: Not on file  . Years of education: Not on file  . Highest education level: Not on file  Social Needs  . Financial resource strain: Not on file  . Food insecurity - worry: Not on  file  . Food insecurity - inability: Not on file  . Transportation needs - medical: Not on file  . Transportation needs - non-medical: Not on file  Occupational History  . Not on file  Tobacco Use  . Smoking status: Former Smoker    Packs/day: 0.75    Years: 30.00    Pack years: 22.50    Types: Cigarettes    Start date: 01/02/1961    Last attempt to quit: 05/31/1987    Years since quitting: 30.0  . Smokeless tobacco: Never Used  Substance and Sexual Activity  . Alcohol use: No    Alcohol/week: 0.0 oz  . Drug use: No  . Sexual activity: Yes  Other Topics Concern  . Not on file  Social History Narrative  . Not on file   Family History  Problem Relation Age of Onset  . Diabetes Father   . Heart disease Father   . Hypertension Father   . Diabetes Paternal Grandmother   . Heart disease Paternal Grandmother   . Hypertension Paternal Grandmother   . Diabetes Paternal Uncle   . Heart disease Paternal Uncle   . Heart disease Paternal Uncle   . Hypertension Paternal Uncle   . Heart disease Paternal Uncle   . Hypertension Mother   . Heart disease Mother   . Drug abuse Son     Objective: Office vital signs reviewed. BP (!) 150/81   Pulse 69   Temp (!) 97.1 F (36.2 C) (Oral)   Ht 5' 7" (1.702 m)   Wt 184 lb (83.5 kg)   BMI 28.82 kg/m   Physical Examination:  General: Awake, alert, well appearing elderly male, No acute distress Cardio: regular rate, +2 DP Pulm: no wheeze. normal work of breathing on room air Extremities: warm, well perfused, No edema, cyanosis or clubbing; +2 pulses bilaterally MSK: normal gait and normal station  Upper extremities: He has full active range of motion in all planes.  He does have some stiffness and discomfort with abduction and external rotation of the upper extremities.  No visible gross abnormalities upon inspection.  He does have tenderness to palpation to the entire bilateral shoulder girdles.  No palpable bony abnormalities. Skin:  dry; intact; no rashes or lesions Neuro: 5/5 UE and LE Strength and light touch sensation grossly intact  Assessment/ Plan: 71 y.o. male   1. Myalgia I suspect that perceived myalgias is a likely joint and not muscle.  However, will obtain a CMP and CK to further evaluate. - CMP14+EGFR - CK  2. Weakness generalized His physical exam actually did not demonstrate weakness during  today's exam.  Will rule out possible autoimmune etiology. - CMP14+EGFR - CK - ANA w/Reflex if Positive - Sedimentation Rate - C-reactive protein  3. Acute pain of both shoulders Likely osteoarthritis.  However, given acute onset, will rule out autoimmune pathology.  Differential diagnoses considered include polymyalgia rheumatica, rheumatoid arthritis, polymyositis.  For now, I did recommend that he consider empiric treatment with Tylenol arthritis every 8 hours as needed pain. Strict return precautions and reasons for emergent evaluation in the emergency department review with patient.  They voiced understanding and will follow-up as needed. - CMP14+EGFR - CK - ANA w/Reflex if Positive - Sedimentation Rate - C-reactive protein  Orders Placed This Encounter  Procedures  . CMP14+EGFR  . CK  . ANA w/Reflex if Positive  . Sedimentation Rate  . C-reactive protein    Janora Norlander, DO [place] [phone]

## 2017-05-24 NOTE — Patient Instructions (Signed)
As we discussed, this may be osteoarthritis.  I would like you to start on arthritis every 8 hours as needed for the pain.  In the interim, we will obtain some labs to make sure that this is not an autoimmune process like rheumatoid arthritis.  We will contact you once these labs result.   Osteoarthritis Osteoarthritis is a type of arthritis that affects tissue that covers the ends of bones in joints (cartilage). Cartilage acts as a cushion between the bones and helps them move smoothly. Osteoarthritis results when cartilage in the joints gets worn down. Osteoarthritis is sometimes called "wear and tear" arthritis. Osteoarthritis is the most common form of arthritis. It often occurs in older people. It is a condition that gets worse over time (a progressive condition). Joints that are most often affected by this condition are in:  Fingers.  Toes.  Hips.  Knees.  Spine, including neck and lower back.  What are the causes? This condition is caused by age-related wearing down of cartilage that covers the ends of bones. What increases the risk? The following factors may make you more likely to develop this condition:  Older age.  Being overweight or obese.  Overuse of joints, such as in athletes.  Past injury of a joint.  Past surgery on a joint.  Family history of osteoarthritis.  What are the signs or symptoms? The main symptoms of this condition are pain, swelling, and stiffness in the joint. The joint may lose its shape over time. Small pieces of bone or cartilage may break off and float inside of the joint, which may cause more pain and damage to the joint. Small deposits of bone (osteophytes) may grow on the edges of the joint. Other symptoms may include:  A grating or scraping feeling inside the joint when you move it.  Popping or creaking sounds when you move.  Symptoms may affect one or more joints. Osteoarthritis in a major joint, such as your knee or hip, can make it  painful to walk or exercise. If you have osteoarthritis in your hands, you might not be able to grip items, twist your hand, or control small movements of your hands and fingers (fine motor skills). How is this diagnosed? This condition may be diagnosed based on:  Your medical history.  A physical exam.  Your symptoms.  X-rays of the affected joint(s).  Blood tests to rule out other types of arthritis.  How is this treated? There is no cure for this condition, but treatment can help to control pain and improve joint function. Treatment plans may include:  A prescribed exercise program that allows for rest and joint relief. You may work with a physical therapist.  A weight control plan.  Pain relief techniques, such as: ? Applying heat and cold to the joint. ? Electric pulses delivered to nerve endings under the skin (transcutaneous electrical nerve stimulation, or TENS). ? Massage. ? Certain nutritional supplements.  NSAIDs or prescription medicines to help relieve pain.  Medicine to help relieve pain and inflammation (corticosteroids). This can be given by mouth (orally) or as an injection.  Assistive devices, such as a brace, wrap, splint, specialized glove, or cane.  Surgery, such as: ? An osteotomy. This is done to reposition the bones and relieve pain or to remove loose pieces of bone and cartilage. ? Joint replacement surgery. You may need this surgery if you have very bad (advanced) osteoarthritis.  Follow these instructions at home: Activity  Rest your affected joints  as directed by your health care provider.  Do not drive or use heavy machinery while taking prescription pain medicine.  Exercise as directed. Your health care provider or physical therapist may recommend specific types of exercise, such as: ? Strengthening exercises. These are done to strengthen the muscles that support joints that are affected by arthritis. They can be performed with weights or  with exercise bands to add resistance. ? Aerobic activities. These are exercises, such as brisk walking or water aerobics, that get your heart pumping. ? Range-of-motion activities. These keep your joints easy to move. ? Balance and agility exercises. Managing pain, stiffness, and swelling  If directed, apply heat to the affected area as often as told by your health care provider. Use the heat source that your health care provider recommends, such as a moist heat pack or a heating pad. ? If you have a removable assistive device, remove it as told by your health care provider. ? Place a towel between your skin and the heat source. If your health care provider tells you to keep the assistive device on while you apply heat, place a towel between the assistive device and the heat source. ? Leave the heat on for 20-30 minutes. ? Remove the heat if your skin turns bright red. This is especially important if you are unable to feel pain, heat, or cold. You may have a greater risk of getting burned.  If directed, put ice on the affected joint: ? If you have a removable assistive device, remove it as told by your health care provider. ? Put ice in a plastic bag. ? Place a towel between your skin and the bag. If your health care provider tells you to keep the assistive device on during icing, place a towel between the assistive device and the bag. ? Leave the ice on for 20 minutes, 2-3 times a day. General instructions  Take over-the-counter and prescription medicines only as told by your health care provider.  Maintain a healthy weight. Follow instructions from your health care provider for weight control. These may include dietary restrictions.  Do not use any products that contain nicotine or tobacco, such as cigarettes and e-cigarettes. These can delay bone healing. If you need help quitting, ask your health care provider.  Use assistive devices as directed by your health care provider.  Keep all  follow-up visits as told by your health care provider. This is important. Where to find more information:  [employer] of Arthritis and Musculoskeletal and Skin Diseases: www.niams.SouthExposed.es  [employer] on Aging: http://kim-miller.com/  American College of Rheumatology: www.rheumatology.org Contact a health care provider if:  Your skin turns red.  You develop a rash.  You have pain that gets worse.  You have a fever along with joint or muscle aches. Get help right away if:  You lose a lot of weight.  You suddenly lose your appetite.  You have night sweats. Summary  Osteoarthritis is a type of arthritis that affects tissue covering the ends of bones in joints (cartilage).  This condition is caused by age-related wearing down of cartilage that covers the ends of bones.  The main symptom of this condition is pain, swelling, and stiffness in the joint.  There is no cure for this condition, but treatment can help to control pain and improve joint function. This information is not intended to replace advice given to you by your health care provider. Make sure you discuss any questions you have with your health  care provider. Document Released: 05/16/2005 Document Revised: 01/18/2016 Document Reviewed: 01/18/2016 Elsevier Interactive Patient Education  [employer].

## 2017-05-25 LAB — CMP14+EGFR
ALK PHOS: 107 IU/L (ref 39–117)
ALT: 20 IU/L (ref 0–44)
AST: 21 IU/L (ref 0–40)
Albumin/Globulin Ratio: 1.8 (ref 1.2–2.2)
Albumin: 4.1 g/dL (ref 3.5–4.8)
BILIRUBIN TOTAL: 0.4 mg/dL (ref 0.0–1.2)
BUN / CREAT RATIO: 10 (ref 10–24)
BUN: 7 mg/dL — AB (ref 8–27)
CHLORIDE: 99 mmol/L (ref 96–106)
CO2: 24 mmol/L (ref 20–29)
CREATININE: 0.68 mg/dL — AB (ref 0.76–1.27)
Calcium: 9 mg/dL (ref 8.6–10.2)
GFR calc Af Amer: 110 mL/min/{1.73_m2} (ref >59)
GFR calc non Af Amer: 95 mL/min/{1.73_m2} (ref >59)
GLUCOSE: 111 mg/dL — AB (ref 65–99)
Globulin, Total: 2.3 g/dL (ref 1.5–4.5)
Potassium: 4 mmol/L (ref 3.5–5.2)
Sodium: 136 mmol/L (ref 134–144)
Total Protein: 6.4 g/dL (ref 6.0–8.5)

## 2017-05-25 LAB — C-REACTIVE PROTEIN: CRP: 2.5 mg/L (ref 0.0–4.9)

## 2017-05-25 LAB — CK: CK TOTAL: 207 U/L — AB (ref 24–204)

## 2017-05-25 LAB — SEDIMENTATION RATE: SED RATE: 13 mm/h (ref 0–30)

## 2017-05-25 LAB — ANA W/REFLEX IF POSITIVE: ANA: NEGATIVE

## 2017-07-19 ENCOUNTER — Encounter (HOSPITAL_COMMUNITY): Payer: Self-pay | Admitting: *Deleted

## 2017-07-19 ENCOUNTER — Emergency Department (HOSPITAL_COMMUNITY)
Admission: EM | Admit: 2017-07-19 | Discharge: 2017-07-19 | Disposition: A | Discharge status: Home / Self Care | Payer: Medicare Other | Attending: Emergency Medicine | Admitting: Emergency Medicine

## 2017-07-19 ENCOUNTER — Emergency Department (HOSPITAL_COMMUNITY): Payer: Medicare Other

## 2017-07-19 ENCOUNTER — Telehealth: Payer: Self-pay | Admitting: Pediatrics

## 2017-07-19 ENCOUNTER — Other Ambulatory Visit: Payer: Self-pay

## 2017-07-19 DIAGNOSIS — Z7902 Long term (current) use of antithrombotics/antiplatelets: Secondary | ICD-10-CM | POA: Diagnosis not present

## 2017-07-19 DIAGNOSIS — I2510 Atherosclerotic heart disease of native coronary artery without angina pectoris: Secondary | ICD-10-CM | POA: Insufficient documentation

## 2017-07-19 DIAGNOSIS — Z87891 Personal history of nicotine dependence: Secondary | ICD-10-CM | POA: Diagnosis not present

## 2017-07-19 DIAGNOSIS — I10 Essential (primary) hypertension: Secondary | ICD-10-CM | POA: Diagnosis not present

## 2017-07-19 DIAGNOSIS — Z96651 Presence of right artificial knee joint: Secondary | ICD-10-CM | POA: Insufficient documentation

## 2017-07-19 DIAGNOSIS — E119 Type 2 diabetes mellitus without complications: Secondary | ICD-10-CM | POA: Diagnosis not present

## 2017-07-19 DIAGNOSIS — J449 Chronic obstructive pulmonary disease, unspecified: Secondary | ICD-10-CM | POA: Diagnosis not present

## 2017-07-19 DIAGNOSIS — Z79899 Other long term (current) drug therapy: Secondary | ICD-10-CM | POA: Diagnosis not present

## 2017-07-19 DIAGNOSIS — N390 Urinary tract infection, site not specified: Secondary | ICD-10-CM | POA: Insufficient documentation

## 2017-07-19 DIAGNOSIS — Z951 Presence of aortocoronary bypass graft: Secondary | ICD-10-CM | POA: Insufficient documentation

## 2017-07-19 DIAGNOSIS — R1030 Lower abdominal pain, unspecified: Secondary | ICD-10-CM | POA: Diagnosis not present

## 2017-07-19 DIAGNOSIS — Z7984 Long term (current) use of oral hypoglycemic drugs: Secondary | ICD-10-CM | POA: Diagnosis not present

## 2017-07-19 DIAGNOSIS — Z7982 Long term (current) use of aspirin: Secondary | ICD-10-CM | POA: Diagnosis not present

## 2017-07-19 DIAGNOSIS — Z9049 Acquired absence of other specified parts of digestive tract: Secondary | ICD-10-CM | POA: Diagnosis not present

## 2017-07-19 DIAGNOSIS — R110 Nausea: Secondary | ICD-10-CM | POA: Diagnosis not present

## 2017-07-19 DIAGNOSIS — R42 Dizziness and giddiness: Secondary | ICD-10-CM

## 2017-07-19 DIAGNOSIS — R0789 Other chest pain: Secondary | ICD-10-CM | POA: Diagnosis not present

## 2017-07-19 DIAGNOSIS — R109 Unspecified abdominal pain: Secondary | ICD-10-CM | POA: Diagnosis not present

## 2017-07-19 DIAGNOSIS — R0602 Shortness of breath: Secondary | ICD-10-CM | POA: Diagnosis not present

## 2017-07-19 LAB — COMPREHENSIVE METABOLIC PANEL
ALBUMIN: 4 g/dL (ref 3.5–5.0)
ALK PHOS: 53 U/L (ref 38–126)
ALT: 13 U/L — ABNORMAL LOW (ref 17–63)
ANION GAP: 12 (ref 5–15)
AST: 18 U/L (ref 15–41)
BILIRUBIN TOTAL: 0.9 mg/dL (ref 0.3–1.2)
BUN: 15 mg/dL (ref 6–20)
CALCIUM: 9.5 mg/dL (ref 8.9–10.3)
CO2: 22 mmol/L (ref 22–32)
CREATININE: 0.88 mg/dL (ref 0.61–1.24)
Chloride: 103 mmol/L (ref 101–111)
GFR calc Af Amer: >60 mL/min (ref >60)
GFR calc non Af Amer: >60 mL/min (ref >60)
GLUCOSE: 79 mg/dL (ref 65–99)
Potassium: 5.3 mmol/L — ABNORMAL HIGH (ref 3.5–5.1)
Sodium: 137 mmol/L (ref 135–145)
TOTAL PROTEIN: 7.1 g/dL (ref 6.5–8.1)

## 2017-07-19 LAB — CBC
HCT: 41.8 % (ref 39.0–52.0)
Hemoglobin: 13 g/dL (ref 13.0–17.0)
MCH: 24.7 pg — AB (ref 26.0–34.0)
MCHC: 31.1 g/dL (ref 30.0–36.0)
MCV: 79.5 fL (ref 78.0–100.0)
Platelets: 214 10*3/uL (ref 150–400)
RBC: 5.26 MIL/uL (ref 4.22–5.81)
RDW: 20 % — AB (ref 11.5–15.5)
WBC: 15 10*3/uL — ABNORMAL HIGH (ref 4.0–10.5)

## 2017-07-19 LAB — URINALYSIS, ROUTINE W REFLEX MICROSCOPIC
Bilirubin Urine: NEGATIVE
GLUCOSE, UA: NEGATIVE mg/dL
HGB URINE DIPSTICK: NEGATIVE
KETONES UR: NEGATIVE mg/dL
NITRITE: NEGATIVE
PH: 5 (ref 5.0–8.0)
Protein, ur: NEGATIVE mg/dL
Specific Gravity, Urine: 1.018 (ref 1.005–1.030)

## 2017-07-19 LAB — LIPASE, BLOOD: LIPASE: 98 U/L — AB (ref 11–51)

## 2017-07-19 LAB — I-STAT TROPONIN, ED
Troponin i, poc: 0 ng/mL (ref 0.00–0.08)
Troponin i, poc: 0 ng/mL (ref 0.00–0.08)

## 2017-07-19 MED ORDER — IOPAMIDOL (ISOVUE-300) INJECTION 61%
INTRAVENOUS | Status: AC
  Administered 2017-07-19: 100 mL
  Filled 2017-07-19: qty 100

## 2017-07-19 MED ORDER — CEPHALEXIN 500 MG PO CAPS: 500.0000 mg | ORAL_CAPSULE | Freq: Three times a day (TID) | ORAL | 0 refills | Status: DC

## 2017-07-19 MED ORDER — SODIUM CHLORIDE 0.9 % IV SOLN
1.0000 g | Freq: Once | INTRAVENOUS | Status: AC
  Administered 2017-07-19: 1 g via INTRAVENOUS
  Filled 2017-07-19: qty 10

## 2017-07-19 NOTE — ED Triage Notes (Signed)
Pt reports several days of feeling sob and lightheaded. States he has abd pain and difficulty urinating and having frequent bowel movements. spo2 91% at triage.

## 2017-07-19 NOTE — ED Notes (Signed)
Gave pt a [place] sandwich and a sprite

## 2017-07-19 NOTE — ED Notes (Signed)
Pt had 51ml in bladder after voiding

## 2017-07-19 NOTE — Telephone Encounter (Signed)
PT called in this morning stating that he was having hard time breathing and was feeling light headed and feels like he could pass out. Pt was advised by clinical staff and Dr to go to ER. Pt states that he will go once he gets his wife taken care of.

## 2017-07-19 NOTE — Discharge Instructions (Signed)
Take keflex as prescribed until all gone for urinary tract infection. Drink plenty of fluids. Follow up with primary care doctor in 3 days for recheck. Return if worsening symptoms.

## 2017-07-19 NOTE — ED Provider Notes (Signed)
[facility] EMERGENCY DEPARTMENT Provider Note   CSN: [id number] Arrival date & time: 07/19/17  9509     History   Chief Complaint Chief Complaint  Patient presents with  . Shortness of Breath  . Dizziness  . Abdominal Pain    HPI Eduardo [name] is a 72 y.o. male.  HPI Eduardo [name] is a 72 y.o. male with history of coronary artery disease, diabetes, hypertension, COPD, PTSD, presents to emergency department with complaint of dizziness, lightheadedness, chest pain, abdominal pain.  Patient states that his symptoms began last night.  He does mentions that his wife is having surgery today here in the hospital, but he does not think his symptoms are related to that.  He states he is so dizzy he feels like he is going to faint.  He reports some chest discomfort, but states his main pain is in his abdomen.  He states abdomen feels swollen.  He states he had a colonoscopy 2 months ago and since then his stools have been loose and not normal.  He also has had some difficulty urinating for the last several months.  He states he had his prostate checked and he was told it was enlarged.  He denies any pain with urination.  No he no urgency or frequency.  He denies any nausea or vomiting.  He denies any back pain.  Past Medical History:  Diagnosis Date  . Arthritis   . Bilateral carotid artery disease ([place]) 04/28/2015   1-39 percent bilateral stenosis noted on Doppler   . CAD (coronary artery disease)   . Cervical disc disease   . Chemical exposure    agent orange   . COPD (chronic obstructive pulmonary disease) ([place])   . Diabetes mellitus without complication ([place])   . Diverticulitis   . Hypertension   . Lumbar disc disease   . Obesity (BMI 30-39.9)   . Peripheral neuropathy    in all extremities  . PTSD (post-traumatic stress disorder)   . Sleep apnea    can't wear cpap  . Thoracic ascending aortic aneurysm [facility])     Patient Active Problem List    Diagnosis Date Noted  . Guaiac positive stools 03/08/2017  . Diarrhea 03/08/2017  . Expressive aphasia 07/15/2016  . Chest pain 09/26/2015  . Elevated TSH 05/08/2015  . S/P CABG x 4   . Bilateral carotid artery disease ([place]) 04/28/2015  . BMI 28.0-28.9,adult   . Unstable angina ([place]) 03/18/2014  . Type 2 diabetes mellitus without complication, without long-term current use of insulin ([place]) 09/21/2013  . COPD (chronic obstructive pulmonary disease) ([place]) 09/21/2013  . PTSD (post-traumatic stress disorder) 09/21/2013  . CAD (coronary artery disease) 04/16/2013  . HTN (hypertension) 04/16/2013  . Hyperlipidemia 04/16/2013    Past Surgical History:  Procedure Laterality Date  . BACK SURGERY    . CARDIAC CATHETERIZATION    . CARDIAC CATHETERIZATION  03/18/14   difficult to determine culprit vesel  . CARDIAC CATHETERIZATION N/A 04/29/2015   Procedure: Left Heart Cath and Coronary Angiography;  Surgeon: [name], MD;  Location: [facility] CV LAB;  Service: Cardiovascular;  Laterality: N/A;  . CARPOMETACARPAL ([place]) FUSION OF THUMB Right 08/04/2016   Procedure: CARPOMETACARPAL [facility] FUSION OF THUMB;  Surgeon: Iran Planas, MD;  Location: [facility];  Service: Orthopedics;  Laterality: Right;  . CERVICAL SPINE SURGERY    . CHOLECYSTECTOMY    . COLONOSCOPY N/A 04/05/2017   Procedure: COLONOSCOPY;  Surgeon: Rogene Houston,  MD;  [facility];  Service: Endoscopy;  Laterality: N/A;  830  . CORONARY ARTERY BYPASS GRAFT N/A 05/04/2015   Procedure: CORONARY ARTERY BYPASS GRAFTING (CABG)  x four,  using left internal mammary artery, and bilateral thigh greater saphenous veins;  Surgeon: [name], MD;  Location: [facility];  Service: Open Heart Surgery;  Laterality: N/A;  . EYE SURGERY Bilateral    cataract  . I&D EXTREMITY Left 05/30/2013   Procedure: IRRIGATION AND DEBRIDEMENT Left Hand and Foreign body removal;  Surgeon: [name], MD;  Location: [facility];  Service:  Orthopedics;  Laterality: Left;  . JOINT REPLACEMENT    . LEFT HEART CATHETERIZATION WITH CORONARY ANGIOGRAM N/A 03/18/2014   Procedure: LEFT HEART CATHETERIZATION WITH CORONARY ANGIOGRAM;  Surgeon: [name], MD;  Location: [facility] CATH LAB;  Service: Cardiovascular;  Laterality: N/A;  . LUMBAR LAMINECTOMY    . PARTIAL COLECTOMY     For diverticulitis  . PTCA     unsuccesful  . REPLACEMENT TOTAL KNEE     right   . SHOULDER ARTHROSCOPY    . SHOULDER SURGERY Bilateral   . TEE WITHOUT CARDIOVERSION N/A 05/04/2015   Procedure: TRANSESOPHAGEAL ECHOCARDIOGRAM (TEE);  Surgeon: [name], MD;  Location: [facility];  Service: Open Heart Surgery;  Laterality: N/A;       Home Medications    Prior to Admission medications   Medication Sig Start Date End Date Taking? Authorizing Provider  albuterol (PROVENTIL HFA;VENTOLIN HFA) 108 (90 BASE) MCG/ACT inhaler Inhale 1 puff into the lungs every 6 (six) hours as needed for wheezing or shortness of breath.    Yes [provider]  aspirin EC 81 MG EC tablet Take 1 tablet (81 mg total) by mouth daily. 05/10/15  Yes Gold, Wayne E, PA-C  carvedilol (COREG) 12.5 MG tablet Take 12.5 mg by mouth 2 (two) times daily.  10/12/16  Yes [provider]  Cholecalciferol (VITAMIN D3) 5000 units CAPS Take 5,000 Units by mouth daily.   Yes [provider]  clopidogrel (PLAVIX) 75 MG tablet Take 75 mg by mouth daily.   Yes [provider]  DULoxetine (CYMBALTA) 20 MG capsule Take 20 mg by mouth 2 (two) times daily.   Yes [provider]  ezetimibe (ZETIA) 10 MG tablet Take 10 mg by mouth at bedtime.    Yes [provider]  gemfibrozil (LOPID) 600 MG tablet Take 600 mg by mouth 2 (two) times daily before a meal.   Yes [provider]  metFORMIN (GLUCOPHAGE) 1000 MG tablet Take 1,000 mg by mouth 2 (two) times daily with a meal.   Yes [provider]  naproxen sodium (ALEVE) 220 MG tablet Take 440 mg  by mouth daily as needed (pain).   Yes [provider]  nitroGLYCERIN (NITROSTAT) 0.4 MG SL tablet Place 0.4 mg under the tongue every 5 (five) minutes as needed for chest pain.   Yes [provider]  omeprazole (PRILOSEC) 20 MG capsule Take 20 mg by mouth daily.   Yes [provider]  tetrahydrozoline 0.05 % ophthalmic solution Place 1-2 drops into both eyes 3 (three) times daily as needed (for dry/irritated eyes).   Yes [provider]  tiotropium (SPIRIVA) 18 MCG inhalation capsule Place 18 mcg into inhaler and inhale daily as needed (for breathing).   Yes [provider]  cetirizine (ZYRTEC) 10 MG tablet Take 1 tablet (10 mg total) by mouth daily. Patient not taking: Reported on  07/19/2017 04/27/17   [name], MD  fluticasone (FLONASE) 50 MCG/ACT nasal spray Place 2 sprays into both nostrils daily. Patient not taking: Reported on 07/19/2017 04/27/17   [name], MD    Family History Family History  Problem Relation Age of Onset  . Diabetes Father   . Heart disease Father   . Hypertension Father   . Diabetes Paternal Grandmother   . Heart disease Paternal Grandmother   . Hypertension Paternal Grandmother   . Diabetes Paternal Uncle   . Heart disease Paternal Uncle   . Heart disease Paternal Uncle   . Hypertension Paternal Uncle   . Heart disease Paternal Uncle   . Hypertension Mother   . Heart disease Mother   . Drug abuse Son     Social History Social History   Tobacco Use  . Smoking status: Former Smoker    Packs/day: 0.75    Years: 30.00    Pack years: 22.50    Types: Cigarettes    Start date: 01/02/1961    Last attempt to quit: 05/31/1987    Years since quitting: 30.1  . Smokeless tobacco: Never Used  Substance Use Topics  . Alcohol use: No    Alcohol/week: 0.0 oz  . Drug use: No     Allergies   Statins and Benazepril   Review of Systems Review of Systems  Constitutional: Negative for chills and  fever.  Respiratory: Positive for chest tightness. Negative for cough and shortness of breath.   Cardiovascular: Negative for chest pain, palpitations and leg swelling.  Gastrointestinal: Positive for abdominal pain. Negative for abdominal distention, diarrhea, nausea and vomiting.  Genitourinary: Negative for dysuria, frequency, hematuria and urgency.  Musculoskeletal: Negative for arthralgias, myalgias, neck pain and neck stiffness.  Skin: Negative for rash.  Allergic/Immunologic: Negative for immunocompromised state.  Neurological: Negative for dizziness, weakness, light-headedness, numbness and headaches.  All other systems reviewed and are negative.    Physical Exam Updated Vital Signs BP (!) 156/80 (BP Location: Left Arm)   Pulse (!) 103   Temp 98.1 F (36.7 C) (Oral)   Resp 20   SpO2 91%   Physical Exam  Constitutional: He appears well-developed and well-nourished. No distress.  HENT:  Head: Normocephalic and atraumatic.  Eyes: Conjunctivae are normal.  Neck: Neck supple.  Cardiovascular: Normal rate, regular rhythm and normal heart sounds.  Pulmonary/Chest: Effort normal. No respiratory distress. He has no wheezes. He has no rales.  Abdominal: Soft. Bowel sounds are normal. He exhibits no distension. There is generalized tenderness. There is no rebound.  Equal distal radial and dorsalis pedis pulses bilaterally  Musculoskeletal: He exhibits no edema.  Neurological: He is alert.  Skin: Skin is warm and dry.  Nursing note and vitals reviewed.    ED Treatments / Results  Labs (all labs ordered are listed, but only abnormal results are displayed) Labs Reviewed  CBC - Abnormal; Notable for the following components:      Result Value   WBC 15.0 (*)    MCH 24.7 (*)    RDW 20.0 (*)    All other components within normal limits  COMPREHENSIVE METABOLIC PANEL - Abnormal; Notable for the following components:   Potassium 5.3 (*)    ALT 13 (*)    All other components  within normal limits  LIPASE, BLOOD - Abnormal; Notable for the following components:   Lipase 98 (*)    All other components within normal limits  URINALYSIS, ROUTINE W REFLEX MICROSCOPIC -  Abnormal; Notable for the following components:   APPearance HAZY (*)    Leukocytes, UA MODERATE (*)    Bacteria, UA FEW (*)    Squamous Epithelial / LPF 0-5 (*)    All other components within normal limits  URINE CULTURE  I-STAT TROPONIN, ED  I-STAT TROPONIN, ED    EKG  EKG Interpretation  Date/Time:  Wednesday July 19 2017 09:10:21 EST Ventricular Rate:  70 PR Interval:  198 QRS Duration: 88 QT Interval:  366 QTC Calculation: 395 R Axis:   64 Text Interpretation:  Normal sinus rhythm Normal ECG No significant change since last tracing Confirmed by Wandra Arthurs [phone]) on 07/19/2017 9:42:44 AM       Radiology Dg Chest 2 View  Result Date: 07/19/2017 CLINICAL DATA:  Shortness of Breath EXAM: CHEST  2 VIEW COMPARISON:  Chest radiograph July 15, 2016 and chest CT August 24, 2016 FINDINGS: There is no edema or consolidation. Heart size and pulmonary vascularity are normal. No adenopathy. Patient is status post coronary artery bypass grafting. No evident bone lesions. IMPRESSION: Status post coronary artery bypass grafting. No edema or consolidation. Electronically Signed   By: Lowella Grip III M.D.   On: 07/19/2017 10:06   Ct Abdomen Pelvis W Contrast  Result Date: 07/19/2017 CLINICAL DATA:  Right-sided abdominal pain and nausea. EXAM: CT ABDOMEN AND PELVIS WITH CONTRAST TECHNIQUE: Multidetector CT imaging of the abdomen and pelvis was performed using the standard protocol following bolus administration of intravenous contrast. CONTRAST:  152mL ISOVUE-300 IOPAMIDOL (ISOVUE-300) INJECTION 61% COMPARISON:  CT scan dated 01/31/2017 FINDINGS: Lower chest: No acute abnormality. Coronary artery calcifications. Aortic atherosclerosis. Hepatobiliary: Multiple small gallstones. No gallbladder  wall thickening or biliary ductal dilatation. Liver parenchyma is normal. Pancreas: Unremarkable. No pancreatic ductal dilatation or surrounding inflammatory changes. Spleen: Normal in size without focal abnormality. Adrenals/Urinary Tract: Normal adrenal glands. 8 mm simple cyst in the upper pole of the right kidney. No hydronephrosis. Bladder appears normal. Stomach/Bowel: Stomach is within normal limits. Appendix appears normal. No evidence of bowel wall thickening, distention, or inflammatory changes. Vascular/Lymphatic: Aortic atherosclerosis. No enlarged abdominal or pelvic lymph nodes. Reproductive: Slightly enlarged prostate gland. Other: No abdominal wall hernia or abnormality. No abdominopelvic ascites. Musculoskeletal: No acute abnormality. Multilevel degenerative disc and joint disease. Solid interbody and posterior fusion at L3-4. IMPRESSION: 1. No acute abnormality. 2.  Aortic Atherosclerosis (ICD10-I70.0). Electronically Signed   By: Lorriane Shire M.D.   On: 07/19/2017 13:13    Procedures Procedures (including critical care time)  Medications Ordered in ED Medications - No data to display   Initial Impression / Assessment and Plan / ED Course  I have reviewed the triage vital signs and the nursing notes.  Pertinent labs & imaging results that were available during my care of the patient were reviewed by me and considered in my medical decision making (see chart for details).     Patient in emergency department with generalized malaise, dizziness, lightheadedness, difficulty urinating, generalized abdominal pain.  He reported some chest pain as well, none at this time.  He states "I just feel bad."  Patient is vital signs are normal, he is nontoxic appearing, he is here in the hospital with his wife who is having surgery today and decided to come get checked out.  Patient does have known thoracic aortic aneurysm, however he is hemodynamic is stable, doubt ruptured aneurysm.  Will get  a chest x-ray to look for widened mediastinum and for further evaluation of his  symptoms as well as blood work and urine analysis.   Bladder scanner showed less than 100 cc of urine in the bladder.  Patient's blood work is significant for elevated white blood cell count of 15, slightly elevated potassium 5.3, otherwise unremarkable.  First troponin is 0.  Patient's urine analysis shows infection, will send cultures and treat with Rocephin in emergency department.  This could certainly explain his dizziness, weakness, difficulty with urination and abdominal pain.  Given tenderness on exam, will get CT abdomen and pelvis  3:11 PM CT scan is negative.  No widened mediastinum on the chest x-ray.  Patient is feeling better.  He received Rocephin in emergency department for UTI.  Will discharge home with Keflex.  He will be staying at the hospital today with his wife, instructed to return if any worsening symptoms.  Patient voiced understanding.  Vitals:   07/19/17 1400 07/19/17 1415 07/19/17 1430 07/19/17 1445  BP: (!) 147/91 138/79 (!) 154/85 (!) 148/79  Pulse:   76 73  Resp: (!) 27 (!) 27 (!) 29 (!) 21  Temp:      TempSrc:      SpO2:   95% 97%     Final Clinical Impressions(s) / ED Diagnoses   Final diagnoses:  Urinary tract infection without hematuria, site unspecified  Dizziness    ED Discharge Orders    None       Jeannett Senior, PA-C 07/19/17 1512    Drenda Freeze, MD 07/20/17 [phone]

## 2017-07-21 LAB — URINE CULTURE

## 2017-07-22 ENCOUNTER — Telehealth: Payer: Self-pay

## 2017-07-22 NOTE — Telephone Encounter (Signed)
Post ED Visit - Positive Culture Follow-up: Successful Patient Follow-Up  Culture assessed and recommendations reviewed by: []  Elenor Quinones, Pharm.D. []  Heide Guile, Pharm.D., BCPS AQ-ID []  Parks Neptune, Pharm.D., BCPS []  Alycia Rossetti, Pharm.D., BCPS []  [place]han, Pharm.D., BCPS, AAHIVP []  Legrand Como, Pharm.D., BCPS, AAHIVP []  Salome Arnt, PharmD, BCPS []  Dimitri Ped, PharmD, BCPS []  Vincenza Hews, PharmD, BCPS [facility] Pharm D Positive urine culture  []  Patient discharged without antimicrobial prescription and treatment is now indicated [x]  Organism is resistant to prescribed ED discharge antimicrobial []  Patient with positive blood cultures  Changes discussed with ED provider: Benedetto Goad [facility] New antibiotic prescription Bactrim DS 1 BID x 10 days Called to [facility] [phone]  Contacted patient, date 07/22/17, time 1004   Delray Reza, Carolynn Comment 07/22/2017, 10:03 AM

## 2017-07-22 NOTE — Progress Notes (Signed)
ED Antimicrobial Stewardship Positive Culture Follow Up   Eduardo [name] is an 72 y.o. male who presented to [facility] on 07/19/2017 with a chief complaint of dizziness, chest pain and abdominal pain.  Chief Complaint  Patient presents with  . Shortness of Breath  . Dizziness  . Abdominal Pain    Recent Results (from the past 720 hour(s))  Urine culture     Status: Abnormal   Collection Time: 07/19/17  1:41 PM  Result Value Ref Range Status   Specimen Description URINE, CLEAN CATCH  Final   Special Requests   Final    NONE Performed at [facility], [address] [place], [place] [address]    Culture >=100,000 COLONIES/mL [facility] MORGANII (A)  Final   Report Status 07/21/2017 FINAL  Final   Organism ID, Bacteria MORGANELLA MORGANII (A)  Final      Susceptibility   Morganella morganii - MIC*    AMPICILLIN >=32 RESISTANT Resistant     CEFAZOLIN >=64 RESISTANT Resistant     CEFTRIAXONE <=1 SENSITIVE Sensitive     CIPROFLOXACIN <=0.25 SENSITIVE Sensitive     GENTAMICIN <=1 SENSITIVE Sensitive     IMIPENEM 4 SENSITIVE Sensitive     NITROFURANTOIN 128 RESISTANT Resistant     TRIMETH/SULFA <=20 SENSITIVE Sensitive     AMPICILLIN/SULBACTAM >=32 RESISTANT Resistant     PIP/TAZO <=4 SENSITIVE Sensitive     * >=100,000 COLONIES/mL MORGANELLA MORGANII    [x]  Treated with cephalexin, organism resistant to prescribed antimicrobial   New antibiotic prescription: Bactrim DS 1 tablet twice daily x 10 days. Stop taking Keflex  ED Provider: Benedetto Goad, PA-C   Albertina Parr, PharmD., BCPS Clinical Pharmacist Pager [phone]

## 2017-07-28 ENCOUNTER — Encounter: Payer: Self-pay | Admitting: Pediatrics

## 2017-07-28 ENCOUNTER — Ambulatory Visit (INDEPENDENT_AMBULATORY_CARE_PROVIDER_SITE_OTHER): Payer: Medicare Other | Admitting: Pediatrics

## 2017-07-28 VITALS — BP 139/86 | HR 67 | Temp 97.2°F | Ht 67.0 in | Wt 184.2 lb

## 2017-07-28 DIAGNOSIS — N390 Urinary tract infection, site not specified: Secondary | ICD-10-CM

## 2017-07-28 DIAGNOSIS — R3911 Hesitancy of micturition: Secondary | ICD-10-CM

## 2017-07-28 DIAGNOSIS — N401 Enlarged prostate with lower urinary tract symptoms: Secondary | ICD-10-CM | POA: Diagnosis not present

## 2017-07-28 DIAGNOSIS — L570 Actinic keratosis: Secondary | ICD-10-CM | POA: Diagnosis not present

## 2017-07-28 MED ORDER — TAMSULOSIN HCL 0.4 MG PO CAPS: 0.4000 mg | ORAL_CAPSULE | Freq: Every day | ORAL | 3 refills | Status: DC

## 2017-07-28 NOTE — Progress Notes (Signed)
  Subjective:   Patient ID: Eduardo [name], male    DOB: 15-Jun-1945, 72 y.o.   MRN: [id number] CC: Hospitalization Follow-up (ED [facility])  HPI: Eduardo [name] is a 72 y.o. male presenting for Hospitalization Follow-up ([facility])  Seen in the emergency room last week for lightheadedness, abdominal pain.  Had a normal CT scan, slightly elevated infection blood cell count.  Urine also with infection cells.  Culture grew Morganella.  Given ceftriaxone, sent home with Keflex that was then switched to Bactrim and culture results return.  Patient is continued Bactrim, has a couple more days left.  Abdominal pain has resolved.  No fevers, regular appetite.  He denies any dysuria.  He has had hesitancy with urination, frequency and nocturia 1-3 times a night for the last few months.  He says he was put on a medicine for his prostate in the past but at that time was stressed because he was told the prostate was enlarged, he was not having any symptoms so he did not continue to take it.  He feels like he has to move and all sorts of directions in order to empty his bladder.  He does feel like he empties his bladder.  He has several places on his skin that he is worried about.  Relevant past medical, surgical, family and social history reviewed. Allergies and medications reviewed and updated. Social History   Tobacco Use  Smoking Status Former Smoker  . Packs/day: 0.75  . Years: 30.00  . Pack years: 22.50  . Types: Cigarettes  . Start date: 01/02/1961  . Last attempt to quit: 05/31/1987  . Years since quitting: 30.1  Smokeless Tobacco Never Used   ROS: Per HPI   Objective:    BP 139/86   Pulse 67   Temp (!) 97.2 F (36.2 C) (Oral)   Ht 5\' 7"  (1.702 m)   Wt 184 lb 3.2 oz (83.6 kg)   BMI 28.85 kg/m   Wt Readings from Last 3 Encounters:  07/28/17 184 lb 3.2 oz (83.6 kg)  05/24/17 184 lb (83.5 kg)  04/27/17 186 lb 3.2 oz (84.5 kg)    Gen: NAD, alert, cooperative with  exam, NCAT EYES: EOMI, no conjunctival injection, or no icterus ENT:  TMs pearly gray b/l LYMPH: no cervical LAD CV: NRRR, normal S1/S2, no murmur, distal pulses 2+ b/l Resp: CTABL, no wheezes, normal WOB Abd: +BS, soft, NT, mildly distended. no guarding or organomegaly Ext: No edema, warm Neuro: Alert and oriented, strength equal b/l UE and LE, coordination grossly normal MSK: normal muscle bulk  Assessment & Plan:  Kanan was seen today for hospitalization follow-up and urinary hesitancy.  Diagnoses and all orders for this visit:  Benign prostatic hyperplasia with urinary hesitancy Start below.  Return precautions discussed. -     tamsulosin (FLOMAX) 0.4 MG CAPS capsule; Take 1 capsule (0.4 mg total) by mouth daily.  Actinic keratoses -     Ambulatory referral to Dermatology  Urinary tract infection without hematuria, site unspecified Continue antibiotic.  Will recheck labs next visit  Follow up plan: Return in about 4 weeks (around 08/25/2017). Assunta Found, MD [place]

## 2017-08-04 ENCOUNTER — Emergency Department (HOSPITAL_BASED_OUTPATIENT_CLINIC_OR_DEPARTMENT_OTHER)
Admission: EM | Admit: 2017-08-04 | Discharge: 2017-08-04 | Disposition: A | Discharge status: Home / Self Care | Payer: Medicare Other | Attending: Emergency Medicine | Admitting: Emergency Medicine

## 2017-08-04 ENCOUNTER — Emergency Department (HOSPITAL_BASED_OUTPATIENT_CLINIC_OR_DEPARTMENT_OTHER): Payer: Medicare Other

## 2017-08-04 ENCOUNTER — Encounter (HOSPITAL_BASED_OUTPATIENT_CLINIC_OR_DEPARTMENT_OTHER): Payer: Self-pay

## 2017-08-04 ENCOUNTER — Other Ambulatory Visit: Payer: Self-pay

## 2017-08-04 DIAGNOSIS — R1084 Generalized abdominal pain: Secondary | ICD-10-CM | POA: Diagnosis not present

## 2017-08-04 DIAGNOSIS — Z7982 Long term (current) use of aspirin: Secondary | ICD-10-CM | POA: Diagnosis not present

## 2017-08-04 DIAGNOSIS — J449 Chronic obstructive pulmonary disease, unspecified: Secondary | ICD-10-CM | POA: Diagnosis not present

## 2017-08-04 DIAGNOSIS — Z951 Presence of aortocoronary bypass graft: Secondary | ICD-10-CM | POA: Insufficient documentation

## 2017-08-04 DIAGNOSIS — I2510 Atherosclerotic heart disease of native coronary artery without angina pectoris: Secondary | ICD-10-CM | POA: Insufficient documentation

## 2017-08-04 DIAGNOSIS — I10 Essential (primary) hypertension: Secondary | ICD-10-CM | POA: Diagnosis not present

## 2017-08-04 DIAGNOSIS — Z79899 Other long term (current) drug therapy: Secondary | ICD-10-CM | POA: Diagnosis not present

## 2017-08-04 DIAGNOSIS — K8020 Calculus of gallbladder without cholecystitis without obstruction: Secondary | ICD-10-CM | POA: Insufficient documentation

## 2017-08-04 DIAGNOSIS — R1010 Upper abdominal pain, unspecified: Secondary | ICD-10-CM | POA: Diagnosis not present

## 2017-08-04 DIAGNOSIS — Z87891 Personal history of nicotine dependence: Secondary | ICD-10-CM | POA: Diagnosis not present

## 2017-08-04 DIAGNOSIS — Z7984 Long term (current) use of oral hypoglycemic drugs: Secondary | ICD-10-CM | POA: Insufficient documentation

## 2017-08-04 DIAGNOSIS — E1140 Type 2 diabetes mellitus with diabetic neuropathy, unspecified: Secondary | ICD-10-CM | POA: Insufficient documentation

## 2017-08-04 LAB — URINALYSIS, ROUTINE W REFLEX MICROSCOPIC
Bilirubin Urine: NEGATIVE
Glucose, UA: NEGATIVE mg/dL
Hgb urine dipstick: NEGATIVE
KETONES UR: NEGATIVE mg/dL
LEUKOCYTES UA: NEGATIVE
NITRITE: NEGATIVE
PH: 6 (ref 5.0–8.0)
Protein, ur: NEGATIVE mg/dL
SPECIFIC GRAVITY, URINE: 1.02 (ref 1.005–1.030)

## 2017-08-04 LAB — CBC WITH DIFFERENTIAL/PLATELET
BASOS PCT: 0 %
Basophils Absolute: 0 10*3/uL (ref 0.0–0.1)
Eosinophils Absolute: 0.1 10*3/uL (ref 0.0–0.7)
Eosinophils Relative: 1 %
HEMATOCRIT: 39.6 % (ref 39.0–52.0)
HEMOGLOBIN: 12.7 g/dL — AB (ref 13.0–17.0)
LYMPHS ABS: 1.1 10*3/uL (ref 0.7–4.0)
LYMPHS PCT: 16 %
MCH: 25.8 pg — ABNORMAL LOW (ref 26.0–34.0)
MCHC: 32.1 g/dL (ref 30.0–36.0)
MCV: 80.5 fL (ref 78.0–100.0)
MONOS PCT: 8 %
Monocytes Absolute: 0.5 10*3/uL (ref 0.1–1.0)
Neutro Abs: 5.1 10*3/uL (ref 1.7–7.7)
Neutrophils Relative %: 75 %
Platelets: 276 10*3/uL (ref 150–400)
RBC: 4.92 MIL/uL (ref 4.22–5.81)
RDW: 21.3 % — AB (ref 11.5–15.5)
WBC: 6.8 10*3/uL (ref 4.0–10.5)

## 2017-08-04 LAB — I-STAT CG4 LACTIC ACID, ED: Lactic Acid, Venous: 1.15 mmol/L (ref 0.5–1.9)

## 2017-08-04 LAB — COMPREHENSIVE METABOLIC PANEL
ALBUMIN: 4.1 g/dL (ref 3.5–5.0)
ALT: 33 U/L (ref 17–63)
AST: 54 U/L — ABNORMAL HIGH (ref 15–41)
Alkaline Phosphatase: 56 U/L (ref 38–126)
Anion gap: 7 (ref 5–15)
BILIRUBIN TOTAL: 0.8 mg/dL (ref 0.3–1.2)
BUN: 22 mg/dL — AB (ref 6–20)
CHLORIDE: 102 mmol/L (ref 101–111)
CO2: 28 mmol/L (ref 22–32)
Calcium: 9 mg/dL (ref 8.9–10.3)
Creatinine, Ser: 0.88 mg/dL (ref 0.61–1.24)
GFR calc Af Amer: >60 mL/min (ref >60)
GFR calc non Af Amer: >60 mL/min (ref >60)
Glucose, Bld: 102 mg/dL — ABNORMAL HIGH (ref 65–99)
POTASSIUM: 4.6 mmol/L (ref 3.5–5.1)
SODIUM: 137 mmol/L (ref 135–145)
TOTAL PROTEIN: 6.9 g/dL (ref 6.5–8.1)

## 2017-08-04 LAB — TROPONIN I: Troponin I: <0.03 ng/mL (ref <0.03)

## 2017-08-04 LAB — LIPASE, BLOOD: Lipase: 51 U/L (ref 11–51)

## 2017-08-04 MED ORDER — IOPAMIDOL (ISOVUE-300) INJECTION 61%
100.0000 mL | Freq: Once | INTRAVENOUS | Status: AC | PRN
  Administered 2017-08-04: 100 mL via INTRAVENOUS

## 2017-08-04 MED ORDER — MORPHINE SULFATE (PF) 4 MG/ML IV SOLN
4.0000 mg | Freq: Once | INTRAVENOUS | Status: AC
  Administered 2017-08-04: 4 mg via INTRAVENOUS
  Filled 2017-08-04: qty 1

## 2017-08-04 MED ORDER — SODIUM CHLORIDE 0.9 % IV BOLUS (SEPSIS)
1000.0000 mL | Freq: Once | INTRAVENOUS | Status: AC
  Administered 2017-08-04: 1000 mL via INTRAVENOUS

## 2017-08-04 NOTE — ED Triage Notes (Signed)
Pt reports mid lower abdominal pain starting last night. Pt denies n/v/d. Pt reports he recently got over a urinary tract infection. Pt reports he was taking a medication for it but was called and told that one was not effective and started him on a new medication.

## 2017-08-04 NOTE — ED Provider Notes (Signed)
[place] EMERGENCY DEPARTMENT Provider Note   CSN: [id number] Arrival date & time: 08/04/17  1150     History   Chief Complaint Chief Complaint  Patient presents with  . Abdominal Pain    HPI Eduardo [name] is a 72 y.o. male.  Eduardo [name] was recently here about 2 weeks ago with chest pain and abdominal pain and found to have a UTI.  Eduardo [name] has been treated with antibiotics and just finished him.  Eduardo [name] presents today with acutely waking up around 2 this morning with generalized severe abdominal pain.  States that some extreme pressure feels like his stomach is going to explode.  It is throbbing in nature.  Eduardo [name] denies that Eduardo [name] had a fever.  It does not radiate anywhere and is not associate with any nausea vomiting or diarrhea.  Eduardo [name] moved his bowels this morning and was normal with no signs of blood.  Eduardo [name] has been urinating normally without any symptoms.  Eduardo [name] does have prior surgical history with his abdomen with diverticulitis and resection of a portion of bowel.  Is also had a CABG.  Eduardo [name] has a known thoracic aneurysm.  The history is provided by the patient.  Abdominal Pain   This is a new problem. The current episode started 6 to 12 hours ago. The problem occurs constantly. The problem has not changed since onset.The pain is associated with an unknown factor. The pain is located in the generalized abdominal region. The quality of the pain is pressure-like and throbbing. The pain is severe. Associated symptoms include flatus. Pertinent negatives include anorexia, fever, belching, diarrhea, hematochezia, melena, nausea, vomiting, constipation, dysuria, frequency, hematuria, headaches, arthralgias and myalgias. The symptoms are aggravated by certain positions. Nothing relieves the symptoms. Past workup includes surgery.    Past Medical History:  Diagnosis Date  . Arthritis   . Bilateral carotid artery disease ([place]) 04/28/2015   1-39 percent bilateral stenosis noted on Doppler   . CAD  (coronary artery disease)   . Cervical disc disease   . Chemical exposure    agent orange   . COPD (chronic obstructive pulmonary disease) ([place])   . Diabetes mellitus without complication ([place])   . Diverticulitis   . Hypertension   . Lumbar disc disease   . Obesity (BMI 30-39.9)   . Peripheral neuropathy    in all extremities  . PTSD (post-traumatic stress disorder)   . Sleep apnea    can't wear cpap  . Thoracic ascending aortic aneurysm [facility]     Patient Active Problem List   Diagnosis Date Noted  . Guaiac positive stools 03/08/2017  . Diarrhea 03/08/2017  . Expressive aphasia 07/15/2016  . Chest pain 09/26/2015  . Elevated TSH 05/08/2015  . S/P CABG x 4   . Bilateral carotid artery disease ([place]) 04/28/2015  . BMI 28.0-28.9,adult   . Unstable angina ([place]) 03/18/2014  . Type 2 diabetes mellitus without complication, without long-term current use of insulin ([place]) 09/21/2013  . COPD (chronic obstructive pulmonary disease) ([place]) 09/21/2013  . PTSD (post-traumatic stress disorder) 09/21/2013  . CAD (coronary artery disease) 04/16/2013  . HTN (hypertension) 04/16/2013  . Hyperlipidemia 04/16/2013    Past Surgical History:  Procedure Laterality Date  . BACK SURGERY    . CARDIAC CATHETERIZATION    . CARDIAC CATHETERIZATION  03/18/14   difficult to determine culprit vesel  . CARDIAC CATHETERIZATION N/A 04/29/2015   Procedure: Left Heart Cath and Coronary Angiography;  Surgeon: [name], MD;  Location: [facility]  INVASIVE CV LAB;  Service: Cardiovascular;  Laterality: N/A;  . CARPOMETACARPAL ([place]) FUSION OF THUMB Right 08/04/2016   Procedure: CARPOMETACARPAL [facility]) FUSION OF THUMB;  Surgeon: Iran Planas, MD;  Location: [place];  Service: Orthopedics;  Laterality: Right;  . CERVICAL SPINE SURGERY    . CHOLECYSTECTOMY    . COLONOSCOPY N/A 04/05/2017   Procedure: COLONOSCOPY;  Surgeon: Rogene Houston, MD;  [facility];  Service: Endoscopy;  Laterality: N/A;  830  .  CORONARY ARTERY BYPASS GRAFT N/A 05/04/2015   Procedure: CORONARY ARTERY BYPASS GRAFTING (CABG)  x four,  using left internal mammary artery, and bilateral thigh greater saphenous veins;  Surgeon: [name], MD;  Location: [place];  Service: Open Heart Surgery;  Laterality: N/A;  . EYE SURGERY Bilateral    cataract  . I&D EXTREMITY Left 05/30/2013   Procedure: IRRIGATION AND DEBRIDEMENT Left Hand and Foreign body removal;  Surgeon: [name], MD;  Location: [place];  Service: Orthopedics;  Laterality: Left;  . JOINT REPLACEMENT    . LEFT HEART CATHETERIZATION WITH CORONARY ANGIOGRAM N/A 03/18/2014   Procedure: LEFT HEART CATHETERIZATION WITH CORONARY ANGIOGRAM;  Surgeon: [name], MD;  Location: [facility] CATH LAB;  Service: Cardiovascular;  Laterality: N/A;  . LUMBAR LAMINECTOMY    . PARTIAL COLECTOMY     For diverticulitis  . PTCA     unsuccesful  . REPLACEMENT TOTAL KNEE     right   . SHOULDER ARTHROSCOPY    . SHOULDER SURGERY Bilateral   . TEE WITHOUT CARDIOVERSION N/A 05/04/2015   Procedure: TRANSESOPHAGEAL ECHOCARDIOGRAM (TEE);  Surgeon: [name], MD;  Location: [place];  Service: Open Heart Surgery;  Laterality: N/A;       Home Medications    Prior to Admission medications   Medication Sig Start Date End Date Taking? Authorizing Provider  aspirin EC 81 MG EC tablet Take 1 tablet (81 mg total) by mouth daily. 05/10/15  Yes Gold, Wayne E, PA-C  carvedilol (COREG) 12.5 MG tablet Take 12.5 mg by mouth 2 (two) times daily.  10/12/16  Yes [provider]  clopidogrel (PLAVIX) 75 MG tablet Take 75 mg by mouth daily.   Yes [provider]  DULoxetine (CYMBALTA) 20 MG capsule Take 20 mg by mouth 2 (two) times daily.   Yes [provider]  metFORMIN (GLUCOPHAGE) 1000 MG tablet Take 1,000 mg by mouth 2 (two) times daily with a meal.   Yes [provider]  sulfamethoxazole-trimethoprim (BACTRIM DS,SEPTRA DS) 800-160 MG tablet  07/22/17  Yes  [provider]  tamsulosin (FLOMAX) 0.4 MG CAPS capsule Take 1 capsule (0.4 mg total) by mouth daily. 07/28/17  Yes [name], MD  albuterol (PROVENTIL HFA;VENTOLIN HFA) 108 (90 BASE) MCG/ACT inhaler Inhale 1 puff into the lungs every 6 (six) hours as needed for wheezing or shortness of breath.     [provider]  cetirizine (ZYRTEC) 10 MG tablet Take 1 tablet (10 mg total) by mouth daily. 04/27/17   [name], MD  Cholecalciferol (VITAMIN D3) 5000 units CAPS Take 5,000 Units by mouth daily.    [provider]  ezetimibe (ZETIA) 10 MG tablet Take 10 mg by mouth at bedtime.     [provider]  fluticasone (FLONASE) 50 MCG/ACT nasal spray Place 2 sprays into both nostrils daily. 04/27/17   [name], MD  gemfibrozil (LOPID) 600 MG tablet Take 600 mg by mouth 2 (two) times daily before  a meal.    [provider]  naproxen sodium (ALEVE) 220 MG tablet Take 440 mg by mouth daily as needed (pain).    [provider]  nitroGLYCERIN (NITROSTAT) 0.4 MG SL tablet Place 0.4 mg under the tongue every 5 (five) minutes as needed for chest pain.    [provider]  omeprazole (PRILOSEC) 20 MG capsule Take 20 mg by mouth daily.    [provider]  tetrahydrozoline 0.05 % ophthalmic solution Place 1-2 drops into both eyes 3 (three) times daily as needed (for dry/irritated eyes).    [provider]  tiotropium (SPIRIVA) 18 MCG inhalation capsule Place 18 mcg into inhaler and inhale daily as needed (for breathing).    [provider]    Family History Family History  Problem Relation Age of Onset  . Diabetes Father   . Heart disease Father   . Hypertension Father   . Diabetes Paternal Grandmother   . Heart disease Paternal Grandmother   . Hypertension Paternal Grandmother   . Diabetes Paternal Uncle   . Heart disease Paternal Uncle   . Heart disease Paternal Uncle   . Hypertension Paternal Uncle     . Heart disease Paternal Uncle   . Hypertension Mother   . Heart disease Mother   . Drug abuse Son     Social History Social History   Tobacco Use  . Smoking status: Former Smoker    Packs/day: 0.75    Years: 30.00    Pack years: 22.50    Types: Cigarettes    Start date: 01/02/1961    Last attempt to quit: 05/31/1987    Years since quitting: 30.2  . Smokeless tobacco: Never Used  Substance Use Topics  . Alcohol use: No    Alcohol/week: 0.0 oz  . Drug use: No     Allergies   Statins and Benazepril   Review of Systems Review of Systems  Constitutional: Negative for chills and fever.  HENT: Negative for ear pain and sore throat.   Eyes: Negative for pain and visual disturbance.  Respiratory: Negative for cough and shortness of breath.   Cardiovascular: Negative for chest pain and palpitations.  Gastrointestinal: Positive for abdominal pain and flatus. Negative for anorexia, constipation, diarrhea, hematochezia, melena, nausea and vomiting.  Genitourinary: Negative for dysuria, frequency and hematuria.  Musculoskeletal: Negative for arthralgias, back pain and myalgias.  Skin: Negative for color change and rash.  Neurological: Negative for seizures, syncope and headaches.  All other systems reviewed and are negative.    Physical Exam Updated Vital Signs BP 115/77 (BP Location: Right Arm)   Pulse 61   Temp 97.6 F (36.4 C) (Oral)   Resp 14   Ht 5\' 8"  (1.727 m)   Wt 82.6 kg (182 lb)   SpO2 96%   BMI 27.67 kg/m   Physical Exam  Constitutional: Eduardo [name] appears well-developed and well-nourished.  HENT:  Head: Normocephalic and atraumatic.  Eyes: Conjunctivae are normal.  Neck: Neck supple.  Cardiovascular: Normal rate and regular rhythm.  No murmur heard. Pulmonary/Chest: Effort normal and breath sounds normal. No respiratory distress.  Abdominal: Eduardo [name] exhibits distension. Eduardo [name] exhibits no mass. There is generalized tenderness. There is no rigidity and no guarding. No  hernia.  Musculoskeletal: Eduardo [name] exhibits no edema, tenderness or deformity.  Neurological: Eduardo [name] is alert.  Skin: Skin is warm and dry. Capillary refill takes less than 2 seconds.  Psychiatric: Eduardo [name] has a normal mood and affect.  Nursing note and vitals reviewed.  ED Treatments / Results  Labs (all labs ordered are listed, but only abnormal results are displayed) Labs Reviewed  URINE CULTURE  URINALYSIS, ROUTINE W REFLEX MICROSCOPIC  COMPREHENSIVE METABOLIC PANEL  CBC WITH DIFFERENTIAL/PLATELET  LACTIC ACID, PLASMA  LACTIC ACID, PLASMA  TROPONIN I  LIPASE, BLOOD    EKG  EKG Interpretation  Date/Time:  Friday August 04 2017 12:53:32 EST Ventricular Rate:  58 PR Interval:    QRS Duration: 79 QT Interval:  408 QTC Calculation: 401 R Axis:   47 Text Interpretation:  Sinus rhythm Prolonged PR interval Minimal ST elevation, inferior leads similar to prior 2/19 Confirmed by Aletta Edouard [phone]) on 08/04/2017 1:02:29 PM       Radiology Dg Chest 2 View  Result Date: 08/04/2017 CLINICAL DATA:  72 y/o  M; upper abdominal pain since this morning. EXAM: CHEST - 2 VIEW COMPARISON:  07/19/2017 chest radiograph FINDINGS: Normal cardiac silhouette. Post median sternotomy with wires in alignment. Clear lungs. No pleural effusion or pneumothorax. No acute osseous abnormality is evident. IMPRESSION: No acute pulmonary process identified. Electronically Signed   By: Kristine Garbe M.D.   On: 08/04/2017 13:43   Ct Abdomen Pelvis W Contrast  Result Date: 08/04/2017 CLINICAL DATA:  72 year old male with acute abdominal pain since 0230 hours today. EXAM: CT ABDOMEN AND PELVIS WITH CONTRAST TECHNIQUE: Multidetector CT imaging of the abdomen and pelvis was performed using the standard protocol following bolus administration of intravenous contrast. CONTRAST:  134mL ISOVUE-300 IOPAMIDOL (ISOVUE-300) INJECTION 61% COMPARISON:  CT Abdomen and Pelvis 07/19/2017, 01/31/2017. FINDINGS: Lower chest:  Minor atelectasis at the lung bases. Calcified coronary artery and aortic atherosclerosis. No pericardial or pleural effusion. Hepatobiliary: Gallbladder is distended and there are small layering gallstones in the neck of the gallbladder, but no pericholecystic inflammation is evident. The liver enhancement remains normal. The intra- and extrahepatic biliary ducts appear normal. Pancreas: Negative. Spleen: Negative. Adrenals/Urinary Tract: Normal adrenal glands. Bilateral renal enhancement and contrast excretion is symmetric and within normal limits. No hydronephrosis or hydroureter. Normal ureters. Diminutive and unremarkable urinary bladder. Stomach/Bowel: Negative distal colon. Mild diverticulosis at the junction of the descending and sigmoid colon with no active inflammation. Similar retained stool in the transverse colon. Redundant flexures. Negative right colon and appendix. Negative terminal ileum. No dilated or inflamed small bowel. Decompressed stomach and duodenum. No abdominal free air.  No free fluid. Vascular/Lymphatic: Aortoiliac calcified atherosclerosis. Major arterial structures in the abdomen and pelvis are patent. The portal venous system is patent. No lymphadenopathy. Reproductive: Negative. Other: No pelvic free fluid. Musculoskeletal: Prior sternotomy. Prior lower lumbar spine fusion. Chronic severe disc, endplate, facet degeneration at other lumbar levels. No acute osseous abnormality identified. IMPRESSION: 1. Cholelithiasis and distended gallbladder, but no gallbladder inflammation is evident by CT. No biliary ductal enlargement. Recommend follow-up ultrasound if the patient has right upper quadrant pain. 2. No acute or inflammatory process elsewhere in the abdomen or pelvis; stable compared to 07/19/2017. Electronically Signed   By: Genevie Ann M.D.   On: 08/04/2017 13:46    Procedures Procedures (including critical care time)  Medications Ordered in ED Medications  morphine 4 MG/ML  injection 4 mg (not administered)  sodium chloride 0.9 % bolus 1,000 mL (not administered)     Initial Impression / Assessment and Plan / ED Course  I have reviewed the triage vital signs and the nursing notes.  Pertinent labs & imaging results that were available during my care of the patient were reviewed by me and considered  in my medical decision making (see chart for details).  Clinical Course as of Aug 05 1913  Fri Aug 04, 2017  1435 Reviewed the labs with the patient along with his radiology findings.  His labs are unremarkable and the chest x-ray are normal.  His abdominal CT does show some gallbladder distention and stones but no other signs of inflammation within the abdomen.  On reexam Eduardo [name] still had some mild diffuse tenderness.  I recommended to him that Eduardo [name] should stay in the hospital for further evaluation but Eduardo [name] would rather go home because his wife just had surgery and Eduardo [name] needs to be available to her to help her at home.  I offered to try to reach his PCP so I can review the findings with her Eduardo [name] thought that would be helpful.  [MB]  64 Cussed with PCP Dr. Evette Doffing who agrees that Eduardo [name] should be watched but she also says that Eduardo [name] is fairly stubborn.  Eduardo [name] can follow-up with her on Tuesday 1115 and call the office of any acute worsening.  [MB]    Clinical Course User Index [MB] Hayden Rasmussen, MD     Final Clinical Impressions(s) / ED Diagnoses   Final diagnoses:  Generalized abdominal pain  Calculus of gallbladder without cholecystitis without obstruction    ED Discharge Orders    None       Hayden Rasmussen, MD 08/05/17 [phone]

## 2017-08-04 NOTE — ED Notes (Signed)
Called Dr. Assunta Found

## 2017-08-04 NOTE — ED Notes (Signed)
Pt tolerated PO fluids well  

## 2017-08-04 NOTE — Discharge Instructions (Signed)
Your evaluated in the emergency department for diffuse abdominal pain.  We did not find an obvious cause of your symptoms other than gallstones in the gallbladder.  Your pain was improved here.  You were offered admission for further workup but you declined.  You will need to follow-up with your doctor and if the pain worsens or you run a fever or any other concerning symptoms you should go to the nearest emergency department.

## 2017-08-04 NOTE — ED Notes (Signed)
Pt in CT.

## 2017-08-04 NOTE — ED Notes (Signed)
Pt given apple juice per request for PO challenge.

## 2017-08-05 LAB — URINE CULTURE: CULTURE: NO GROWTH

## 2017-08-08 ENCOUNTER — Ambulatory Visit: Payer: Medicare Other | Admitting: Pediatrics

## 2017-08-16 ENCOUNTER — Other Ambulatory Visit: Payer: Self-pay | Admitting: *Deleted

## 2017-08-16 DIAGNOSIS — I712 Thoracic aortic aneurysm, without rupture, unspecified: Secondary | ICD-10-CM

## 2017-08-28 DIAGNOSIS — L570 Actinic keratosis: Secondary | ICD-10-CM | POA: Diagnosis not present

## 2017-08-28 DIAGNOSIS — L821 Other seborrheic keratosis: Secondary | ICD-10-CM | POA: Diagnosis not present

## 2017-08-28 DIAGNOSIS — D692 Other nonthrombocytopenic purpura: Secondary | ICD-10-CM | POA: Diagnosis not present

## 2017-08-28 DIAGNOSIS — D229 Melanocytic nevi, unspecified: Secondary | ICD-10-CM | POA: Diagnosis not present

## 2017-08-31 ENCOUNTER — Encounter: Payer: Self-pay | Admitting: Pediatrics

## 2017-08-31 ENCOUNTER — Telehealth: Payer: Self-pay | Admitting: Internal Medicine

## 2017-08-31 ENCOUNTER — Ambulatory Visit (INDEPENDENT_AMBULATORY_CARE_PROVIDER_SITE_OTHER): Payer: Medicare Other | Admitting: Pediatrics

## 2017-08-31 ENCOUNTER — Telehealth: Payer: Self-pay | Admitting: Cardiovascular Disease

## 2017-08-31 VITALS — BP 139/79 | HR 68 | Temp 96.8°F | Ht 68.0 in | Wt 184.2 lb

## 2017-08-31 DIAGNOSIS — Z87440 Personal history of urinary (tract) infections: Secondary | ICD-10-CM

## 2017-08-31 DIAGNOSIS — R109 Unspecified abdominal pain: Secondary | ICD-10-CM | POA: Diagnosis not present

## 2017-08-31 DIAGNOSIS — E119 Type 2 diabetes mellitus without complications: Secondary | ICD-10-CM | POA: Diagnosis not present

## 2017-08-31 DIAGNOSIS — N400 Enlarged prostate without lower urinary tract symptoms: Secondary | ICD-10-CM | POA: Diagnosis not present

## 2017-08-31 DIAGNOSIS — R195 Other fecal abnormalities: Secondary | ICD-10-CM | POA: Diagnosis not present

## 2017-08-31 LAB — URINALYSIS, COMPLETE
Bilirubin, UA: NEGATIVE
Glucose, UA: NEGATIVE
LEUKOCYTES UA: NEGATIVE
Nitrite, UA: NEGATIVE
PH UA: 5.5 (ref 5.0–7.5)
PROTEIN UA: NEGATIVE
RBC, UA: NEGATIVE
Specific Gravity, UA: 1.025 (ref 1.005–1.030)
Urobilinogen, Ur: 0.2 mg/dL (ref 0.2–1.0)

## 2017-08-31 LAB — MICROSCOPIC EXAMINATION
Bacteria, UA: NONE SEEN
RBC MICROSCOPIC, UA: NONE SEEN /HPF (ref 0–2)
Renal Epithel, UA: NONE SEEN /hpf
WBC, UA: NONE SEEN /hpf (ref 0–5)

## 2017-08-31 LAB — BAYER DCA HB A1C WAIVED: HB A1C: 6.5 % (ref <7.0)

## 2017-08-31 NOTE — Progress Notes (Signed)
Subjective:   Patient ID: Eduardo [name], male    DOB: 12-Jul-1945, 72 y.o.   MRN: [id number] CC: Follow-up (4 week, Urinary Tract Infection)  HPI: Eduardo [name] is a 72 y.o. male presenting for Follow-up (4 week, Urinary Tract Infection)  Abdominal pain: Said several episodes of severe abdominal pain over the last few months that he has ended up in the emergency room for.  Most recently almost 4 weeks ago.  Pain lasted for several hours.  It felt like severe gas pain, starting at the sides of his belly and moving towards his umbilicus.  Not associated with food.  Came out of the blue while he was helping his wife get undressed for surgery.  Since then he says his appetite has been fine.  He is eating 3 meals a day.  He has general soreness in his abdomen all the time.  CT scans have shown small nonobstructive gallstones.  Loose stools: Started after his colonoscopy in November 2018.  Colonoscopy showed diverticulosis, given history of polyps recommended to repeat in 5 years.  Before that he had daily, regular, formed bowel movements.  Since then he has had loose stools about 10 times a day.  Every time he sits down to empty his bladder even if he was not feeling like he needed to pass a bowel movement, he will have a loose stool.  Stools are never formed.  Before the colonoscopy he had noticed some blood in his stool, has not had any since then.  Stools usually brown, once with some black and green in it 2 days ago.  Has to wear a pad all the time because of fear of fecal incontinence, has had 2 accidents when he was not able to get to the bathroom fast enough when he did have the urge.  No fevers.  Since starting on tamsulosin he is been able to empty his bladder regularly.  He has been told that his prostate is enlarged at the [place] before.  He does not know if he has ever had a PSA.  Relevant past medical, surgical, family and social history reviewed. Allergies and medications reviewed  and updated. Social History   Tobacco Use  Smoking Status Former Smoker  . Packs/day: 0.75  . Years: 30.00  . Pack years: 22.50  . Types: Cigarettes  . Start date: 01/02/1961  . Last attempt to quit: 05/31/1987  . Years since quitting: 30.2  Smokeless Tobacco Never Used   ROS: Per HPI   Objective:    BP 139/79   Pulse 68   Temp (!) 96.8 F (36 C) (Oral)   Ht 5' 8"  (1.727 m)   Wt 184 lb 3.2 oz (83.6 kg)   BMI 28.01 kg/m   Wt Readings from Last 3 Encounters:  08/31/17 184 lb 3.2 oz (83.6 kg)  08/04/17 182 lb (82.6 kg)  07/28/17 184 lb 3.2 oz (83.6 kg)    Gen: NAD, alert, cooperative with exam, NCAT EYES: EOMI, no conjunctival injection, or no icterus ENT:  OP without erythema LYMPH: no cervical LAD CV: NRRR, normal S1/S2, no murmur, distal pulses 2+ b/l Resp: CTABL, no wheezes, normal WOB Abd: +BS, soft, generalized soreness throughout with gentle palpation.  Positive right upper quadrant pain.  Mildly distended.  No guarding.   Ext: No edema, warm Neuro: Alert and oriented  Assessment & Plan:  Eduardo [name] was seen today for follow-up multiple medical problems.  Diagnoses and all orders for this visit:  Recent urinary tract  infection BPH Lower urinary tract symptoms have resolved.  Continue tamsulosin -     Urinalysis, Complete  Diabetes mellitus without complication (HCC) -     Bayer DCA Hb A1c Waived  Abdominal pain, unspecified abdominal location Loose stools Episodes of severe debilitating pain the last for a few hours.  Not associated with food.  Generalized soreness all the time.  New loose stools for the last 4 months, 2 episodes of fecal incontinence now having to wear a pad all the time -     Ambulatory referral to Gastroenterology -     CMP14+EGFR  Enlarged prostate -     PSA, total and free  Follow up plan: Return in about 3 months (around 11/30/2017). Assunta Found, MD [place]

## 2017-08-31 NOTE — Telephone Encounter (Signed)
Patient called to let office know to cancel all appointments/recalls for him.

## 2017-08-31 NOTE — Telephone Encounter (Signed)
Received Epic referral. Patient states that he did not feel like he "received the best of care" at [facility]. Dr. Carlean Purl is Doc of the Day for 08/31/17. Records placed on his desk for review.

## 2017-09-01 LAB — CMP14+EGFR
A/G RATIO: 1.8 (ref 1.2–2.2)
ALBUMIN: 4.4 g/dL (ref 3.5–4.8)
ALT: 14 IU/L (ref 0–44)
AST: 15 IU/L (ref 0–40)
Alkaline Phosphatase: 50 IU/L (ref 39–117)
BILIRUBIN TOTAL: 0.3 mg/dL (ref 0.0–1.2)
BUN / CREAT RATIO: 20 (ref 10–24)
BUN: 17 mg/dL (ref 8–27)
CHLORIDE: 101 mmol/L (ref 96–106)
CO2: 23 mmol/L (ref 20–29)
Calcium: 9.3 mg/dL (ref 8.6–10.2)
Creatinine, Ser: 0.84 mg/dL (ref 0.76–1.27)
GFR calc non Af Amer: 87 mL/min/{1.73_m2} (ref >59)
GFR, EST AFRICAN AMERICAN: 101 mL/min/{1.73_m2} (ref >59)
GLUCOSE: 118 mg/dL — AB (ref 65–99)
Globulin, Total: 2.5 g/dL (ref 1.5–4.5)
Potassium: 4.7 mmol/L (ref 3.5–5.2)
Sodium: 140 mmol/L (ref 134–144)
TOTAL PROTEIN: 6.9 g/dL (ref 6.0–8.5)

## 2017-09-01 LAB — PSA, TOTAL AND FREE
PSA, Free Pct: 19.4 %
PSA, Free: 0.62 ng/mL
Prostate Specific Ag, Serum: 3.2 ng/mL (ref 0.0–4.0)

## 2017-09-08 ENCOUNTER — Encounter: Payer: Self-pay | Admitting: Internal Medicine

## 2017-09-08 NOTE — Telephone Encounter (Signed)
Dr.Gessner reviewed records and accepted patient to ov. Patient was called and an appointment was scheduled for 6.12.19.

## 2017-10-05 ENCOUNTER — Ambulatory Visit: Payer: Medicare Other | Admitting: Cardiothoracic Surgery

## 2017-10-05 ENCOUNTER — Other Ambulatory Visit: Payer: Medicare Other

## 2017-10-18 ENCOUNTER — Encounter: Payer: Self-pay | Admitting: Family Medicine

## 2017-10-18 ENCOUNTER — Ambulatory Visit (INDEPENDENT_AMBULATORY_CARE_PROVIDER_SITE_OTHER): Payer: Medicare Other

## 2017-10-18 ENCOUNTER — Ambulatory Visit (INDEPENDENT_AMBULATORY_CARE_PROVIDER_SITE_OTHER): Payer: Medicare Other | Admitting: Family Medicine

## 2017-10-18 VITALS — BP 135/84 | HR 62 | Temp 97.1°F | Ht 68.0 in | Wt 188.8 lb

## 2017-10-18 DIAGNOSIS — M545 Low back pain, unspecified: Secondary | ICD-10-CM

## 2017-10-18 NOTE — Progress Notes (Signed)
BP 135/84   Pulse 62   Temp (!) 97.1 F (36.2 C) (Oral)   Ht 5\' 8"  (1.727 m)   Wt 188 lb 12.8 oz (85.6 kg)   BMI 28.71 kg/m    Subjective:    Patient ID: Eduardo [name], male    DOB: Apr 08, 1946, 72 y.o.   MRN: [id number]  HPI: Eduardo [name] is a 72 y.o. male presenting on 10/18/2017 for Back Pain   HPI Back pain Patient comes in complaining of back pain on his left side that radiates down into his left buttocks.  He says the back pain can come on very severe when he has it and it mainly comes on when he is sitting in certain positions for prolonged period of time.  He says it has been causing him this issue for about 3 or 4 weeks and has been increasing in frequency of his problems.  He says when he gets up and walks it will go away within a few minutes but when he sits it really hurts.  He is concerned because he had a low back surgery between L3 and L5 that sounds like per his description of fusion about 20 years ago and he feels like something has moved or slipped.  Relevant past medical, surgical, family and social history reviewed and updated as indicated. Interim medical history since our last visit reviewed. Allergies and medications reviewed and updated.  Review of Systems  Constitutional: Negative for chills and fever.  Respiratory: Negative for shortness of breath and wheezing.   Cardiovascular: Negative for chest pain and leg swelling.  Musculoskeletal: Positive for back pain. Negative for gait problem.  Skin: Negative for color change and rash.  All other systems reviewed and are negative.   Per HPI unless specifically indicated above   Allergies as of 10/18/2017      Reactions   Statins Other (See Comments)   Body aches   Benazepril    hyperkalemia      Medication List        Accurate as of 10/18/17 12:08 PM. Always use your most recent med list.          albuterol 108 (90 Base) MCG/ACT inhaler Commonly known as:  PROVENTIL HFA;VENTOLIN  HFA Inhale 1 puff into the lungs every 6 (six) hours as needed for wheezing or shortness of breath.   aspirin 81 MG EC tablet Take 1 tablet (81 mg total) by mouth daily.   carvedilol 12.5 MG tablet Commonly known as:  COREG Take 12.5 mg by mouth 2 (two) times daily.   cetirizine 10 MG tablet Commonly known as:  ZYRTEC Take 1 tablet (10 mg total) by mouth daily.   clopidogrel 75 MG tablet Commonly known as:  PLAVIX Take 75 mg by mouth daily.   DULoxetine 20 MG capsule Commonly known as:  CYMBALTA Take 20 mg by mouth 2 (two) times daily.   ezetimibe 10 MG tablet Commonly known as:  ZETIA Take 10 mg by mouth at bedtime.   fluticasone 50 MCG/ACT nasal spray Commonly known as:  FLONASE Place 2 sprays into both nostrils daily.   gemfibrozil 600 MG tablet Commonly known as:  LOPID Take 600 mg by mouth 2 (two) times daily before a meal.   metFORMIN 1000 MG tablet Commonly known as:  GLUCOPHAGE Take 1,000 mg by mouth 2 (two) times daily with a meal.   naproxen sodium 220 MG tablet Commonly known as:  ALEVE Take 440 mg by mouth daily  as needed (pain).   nitroGLYCERIN 0.4 MG SL tablet Commonly known as:  NITROSTAT Place 0.4 mg under the tongue every 5 (five) minutes as needed for chest pain.   omeprazole 20 MG capsule Commonly known as:  PRILOSEC Take 20 mg by mouth daily.   tamsulosin 0.4 MG Caps capsule Commonly known as:  FLOMAX Take 1 capsule (0.4 mg total) by mouth daily.   tetrahydrozoline 0.05 % ophthalmic solution Place 1-2 drops into both eyes 3 (three) times daily as needed (for dry/irritated eyes).   tiotropium 18 MCG inhalation capsule Commonly known as:  SPIRIVA Place 18 mcg into inhaler and inhale daily as needed (for breathing).   Vitamin D3 5000 units Caps Take 5,000 Units by mouth daily.          Objective:    BP 135/84   Pulse 62   Temp (!) 97.1 F (36.2 C) (Oral)   Ht 5\' 8"  (1.727 m)   Wt 188 lb 12.8 oz (85.6 kg)   BMI 28.71 kg/m     Wt Readings from Last 3 Encounters:  10/18/17 188 lb 12.8 oz (85.6 kg)  08/31/17 184 lb 3.2 oz (83.6 kg)  08/04/17 182 lb (82.6 kg)    Physical Exam  Constitutional: He is oriented to person, place, and time. He appears well-developed and well-nourished. No distress.  Eyes: Conjunctivae are normal. No scleral icterus.  Cardiovascular: Normal rate, regular rhythm, normal heart sounds and intact distal pulses.  No murmur heard. Pulmonary/Chest: Effort normal and breath sounds normal. No respiratory distress. He has no wheezes.  Musculoskeletal: Normal range of motion. He exhibits no edema.       Lumbar back: He exhibits tenderness. He exhibits normal range of motion, no bony tenderness and no deformity.       Back:  Neurological: He is alert and oriented to person, place, and time. Coordination normal.  Skin: Skin is warm and dry. No rash noted. He is not diaphoretic.  Psychiatric: He has a normal mood and affect. His behavior is normal.  Nursing note and vitals reviewed.   Patient will come back with an x-ray technician is back from lunch this afternoon for x-ray.    Assessment & Plan:   Problem List Items Addressed This Visit    None    Visit Diagnoses    Lumbar pain    -  Primary   Relevant Orders   DG Lumbar Spine 2-3 Views     Will await x-ray with plan, patient did not want any anti-inflammatories today because he says it does not hurt and that frequently and is finally stands up but he just wants to know what is causing it.  Follow up plan: Return if symptoms worsen or fail to improve.  Counseling provided for all of the vaccine components Orders Placed This Encounter  Procedures  . DG Lumbar Spine 2-3 Views    Caryl Pina, MD [facility] 10/18/2017, 12:08 PM

## 2017-10-19 ENCOUNTER — Ambulatory Visit (INDEPENDENT_AMBULATORY_CARE_PROVIDER_SITE_OTHER): Payer: Medicare Other | Admitting: Cardiothoracic Surgery

## 2017-10-19 ENCOUNTER — Other Ambulatory Visit: Payer: Self-pay

## 2017-10-19 ENCOUNTER — Ambulatory Visit
Admission: RE | Admit: 2017-10-19 | Discharge: 2017-10-19 | Disposition: A | Discharge status: Home / Self Care | Payer: Medicare Other | Source: Ambulatory Visit | Attending: Cardiothoracic Surgery | Admitting: Cardiothoracic Surgery

## 2017-10-19 VITALS — BP 145/80 | HR 60 | Resp 20 | Ht 68.0 in | Wt 188.0 lb

## 2017-10-19 DIAGNOSIS — Z951 Presence of aortocoronary bypass graft: Secondary | ICD-10-CM | POA: Diagnosis not present

## 2017-10-19 DIAGNOSIS — I712 Thoracic aortic aneurysm, without rupture, unspecified: Secondary | ICD-10-CM

## 2017-10-19 DIAGNOSIS — M545 Low back pain, unspecified: Secondary | ICD-10-CM

## 2017-10-19 MED ORDER — IOPAMIDOL (ISOVUE-370) INJECTION 76%
75.0000 mL | Freq: Once | INTRAVENOUS | Status: AC | PRN
  Administered 2017-10-19: 75 mL via INTRAVENOUS

## 2017-10-19 NOTE — Progress Notes (Signed)
[address]       [place] [address]             [phone]                    Eduardo [name] Medical Record #[id number] Date of Birth: August 13, 1945  Referring: Eduardo Commons, MD Primary Care: Eduardo Maize, MD  Chief Complaint:    Chief Complaint  Patient presents with  . Thoracic Aortic Aneurysm    1 year f/u with CTA Chest     History of Present Illness:    Eduardo [name] 72 y.o. male is seen in the office  today for in follow up for midly dilated aorta noted ,     OPERATIVE REPORT PREOPERATIVE DIAGNOSIS:  Unstable angina with 3-vessel coronary artery disease. POSTOPERATIVE DIAGNOSIS:  Unstable angina with 3-vessel coronary artery disease. SURGICAL PROCEDURE:  Coronary artery bypass grafting x4 with the left internal mammary to the left anterior descending coronary artery, reverse saphenous vein graft to the 2nd diagonal coronary artery, reverse saphenous vein graft to the first obtuse marginal, reverse saphenous vein graft to the posterior descending coronary artery with bilateral greater saphenous thigh endoscopic vein harvesting   Follow up scan done recently   Patient has done well following CABG, patient has no recurrent angina  His only complaint at this point is low left back pain and hip pain   Current Activity/ Functional Status:  Patient is independent with mobility/ambulation, transfers, ADL's, IADL's.   Zubrod Score: At the time of surgery this patient's most appropriate activity status/level should be described as: []     0    Normal activity, no symptoms [x]     1    Restricted in physical strenuous activity but ambulatory, able to do out light work []     2    Ambulatory and capable of self care, unable to do work activities, up and about               >50 % of waking hours                              []     3    Only limited self care, in bed greater than 50% of waking hours []     4     Completely disabled, no self care, confined to bed or chair []     5    Moribund   Past Medical History:  Diagnosis Date  . Arthritis   . Bilateral carotid artery disease ([place]) 04/28/2015   1-39 percent bilateral stenosis noted on Doppler   . CAD (coronary artery disease)   . Cervical disc disease   . Chemical exposure    agent orange   . COPD (chronic obstructive pulmonary disease) ([place])   . Diabetes mellitus without complication ([place])   . Diverticulitis   . Hypertension   . Lumbar disc disease   . Obesity (BMI 30-39.9)   . Peripheral neuropathy    in all extremities  . PTSD (post-traumatic stress disorder)   . Sleep apnea    can't wear cpap  . Thoracic ascending aortic aneurysm [facility])     Past Surgical History:  Procedure Laterality Date  . BACK SURGERY    . CARDIAC CATHETERIZATION    . CARDIAC CATHETERIZATION  03/18/14   difficult to determine culprit vesel  . CARDIAC CATHETERIZATION  N/A 04/29/2015   Procedure: Left Heart Cath and Coronary Angiography;  Surgeon: Eduardo Booze, MD;  Location: [place] [facility];  Service: Cardiovascular;  Laterality: N/A;  . CARPOMETACARPAL ([place]) FUSION OF THUMB Right 08/04/2016   Procedure: CARPOMETACARPAL [facility]) FUSION OF THUMB;  Surgeon: Eduardo Planas, MD;  Location: [place];  Service: Orthopedics;  Laterality: Right;  . CERVICAL SPINE SURGERY    . CHOLECYSTECTOMY    . COLONOSCOPY N/A 04/05/2017   Procedure: COLONOSCOPY;  Surgeon: Eduardo Houston, MD;  [facility];  Service: Endoscopy;  Laterality: N/A;  830  . CORONARY ARTERY BYPASS GRAFT N/A 05/04/2015   Procedure: CORONARY ARTERY BYPASS GRAFTING (CABG)  x four,  using left internal mammary artery, and bilateral thigh greater saphenous veins;  Surgeon: Eduardo Isaac, MD;  Location: [place];  Service: Open Heart Surgery;  Laterality: N/A;  . EYE SURGERY Bilateral    cataract  . I&D EXTREMITY Left 05/30/2013   Procedure: IRRIGATION AND DEBRIDEMENT Left Hand and Foreign body  removal;  Surgeon: Eduardo Butters, MD;  Location: [place];  Service: Orthopedics;  Laterality: Left;  . JOINT REPLACEMENT    . LEFT HEART CATHETERIZATION WITH CORONARY ANGIOGRAM N/A 03/18/2014   Procedure: LEFT HEART CATHETERIZATION WITH CORONARY ANGIOGRAM;  Surgeon: Eduardo M Martinique, MD;  Location: [facility];  Service: Cardiovascular;  Laterality: N/A;  . LUMBAR LAMINECTOMY    . PARTIAL COLECTOMY     For diverticulitis  . PTCA     unsuccesful  . REPLACEMENT TOTAL KNEE     right   . SHOULDER ARTHROSCOPY    . SHOULDER SURGERY Bilateral   . TEE WITHOUT CARDIOVERSION N/A 05/04/2015   Procedure: TRANSESOPHAGEAL ECHOCARDIOGRAM (TEE);  Surgeon: Eduardo Isaac, MD;  Location: [place];  Service: Open Heart Surgery;  Laterality: N/A;    Family History  Problem Relation Age of Onset  . Diabetes Father   . Heart disease Father   . Hypertension Father   . Diabetes Paternal Grandmother   . Heart disease Paternal Grandmother   . Hypertension Paternal Grandmother   . Diabetes Paternal Uncle   . Heart disease Paternal Uncle   . Heart disease Paternal Uncle   . Hypertension Paternal Uncle   . Heart disease Paternal Uncle   . Hypertension Mother   . Heart disease Mother   . Drug abuse Son     Social History   Social History  . Marital status: Married    Spouse name: N/A  . Number of children: N/A  . Years of education: N/A   Occupational History  . Not on file.   Social History Main Topics  . Smoking status: Former Smoker    Packs/day: 0.75    Years: 30.00    Types: Cigarettes    Start date: 01/02/1961    Quit date: 05/31/1987  . Smokeless tobacco: Never Used  . Alcohol use No  . Drug use: No  . Sexual activity: Not on file    Social History   Tobacco Use  Smoking Status Former Smoker  . Packs/day: 0.75  . Years: 30.00  . Pack years: 22.50  . Types: Cigarettes  . Start date: 01/02/1961  . Last attempt to quit: 05/31/1987  . Years since quitting: 30.4  Smokeless Tobacco  Never Used    Social History   Substance and Sexual Activity  Alcohol Use No  . Alcohol/week: 0.0 oz     Allergies  Allergen Reactions  . Statins Other (  See Comments)    Body aches  . Benazepril     hyperkalemia    Current Outpatient Medications  Medication Sig Dispense Refill  . albuterol (PROVENTIL HFA;VENTOLIN HFA) 108 (90 BASE) MCG/ACT inhaler Inhale 1 puff into the lungs every 6 (six) hours as needed for wheezing or shortness of breath.     [name] aspirin EC 81 MG EC tablet Take 1 tablet (81 mg total) by mouth daily.    . carvedilol (COREG) 12.5 MG tablet Take 12.5 mg by mouth 2 (two) times daily.     . cetirizine (ZYRTEC) 10 MG tablet Take 1 tablet (10 mg total) by mouth daily. 30 tablet 11  . Cholecalciferol (VITAMIN D3) 5000 units CAPS Take 5,000 Units by mouth daily.    . clopidogrel (PLAVIX) 75 MG tablet Take 75 mg by mouth daily.    . DULoxetine (CYMBALTA) 20 MG capsule Take 20 mg by mouth 2 (two) times daily.    [name] ezetimibe (ZETIA) 10 MG tablet Take 10 mg by mouth at bedtime.     . fluticasone (FLONASE) 50 MCG/ACT nasal spray Place 2 sprays into both nostrils daily. 16 g 6  . gemfibrozil (LOPID) 600 MG tablet Take 600 mg by mouth 2 (two) times daily before a meal.    . metFORMIN (GLUCOPHAGE) 1000 MG tablet Take 1,000 mg by mouth 2 (two) times daily with a meal.    . naproxen sodium (ALEVE) 220 MG tablet Take 440 mg by mouth daily as needed (pain).    . nitroGLYCERIN (NITROSTAT) 0.4 MG SL tablet Place 0.4 mg under the tongue every 5 (five) minutes as needed for chest pain.    [name] omeprazole (PRILOSEC) 20 MG capsule Take 20 mg by mouth daily.    . tamsulosin (FLOMAX) 0.4 MG CAPS capsule Take 1 capsule (0.4 mg total) by mouth daily. 30 capsule 3  . tetrahydrozoline 0.05 % ophthalmic solution Place 1-2 drops into both eyes 3 (three) times daily as needed (for dry/irritated eyes).    [name] tiotropium (SPIRIVA) 18 MCG inhalation capsule Place 18 mcg into inhaler and inhale daily as  needed (for breathing).     No current facility-administered medications for this visit.       Review of Systems:     Cardiac Review of Systems: Y or N  Chest Pain [  n  ]  Resting SOB [ n  ] Exertional SOB  [ n ]  Orthopnea [ n ]   Pedal Edema [  n ]    Palpitations [ n ] Syncope  [ n ]   Presyncope [ n  ]  General Review of Systems: [Y] = yes [  ]=no Constitional: recent weight change [  ];  Wt loss over the last 3 months [   ] anorexia [  ]; fatigue [  ]; nausea [  ]; night sweats [  ]; fever [  ]; or chills [  ];          Dental: poor dentition[  ]; Last Dentist visit:   Eye : blurred vision [  ]; diplopia [   ]; vision changes [  ];  Amaurosis fugax[  ]; Resp: cough [  ];  wheezing[  ];  hemoptysis[  ]; shortness of breath[  ]; paroxysmal nocturnal dyspnea[  ]; dyspnea on exertion[  ]; or orthopnea[  ];  GI:  gallstones[  ], vomiting[  ];  dysphagia[  ]; melena[  ];  hematochezia [  ]; heartburn[  ];  Hx of  Colonoscopy[  ]; GU: kidney stones [  ]; hematuria[  ];   dysuria [  ];  nocturia[  ];  history of     obstruction [  ]; urinary frequency [  ]             Skin: rash, swelling[  ];, hair loss[  ];  peripheral edema[  ];  or itching[  ]; Musculosketetal: myalgias[  ];  joint swelling[  ];  joint erythema[  ];  joint pain[y  ];  back pain[y  ];  Heme/Lymph: bruising[  ];  bleeding[  ];  anemia[  ];  Neuro: TIA[  ];  headaches[  ];  stroke[  ];  vertigo[  ];  seizures[  ];   paresthesias[  ];  difficulty walking[  ];  Psych:depression[  ]; anxiety[  ];  Endocrine: diabetes[  ];  thyroid dysfunction[  ];  Immunizations: Flu up to date [  ]; Pneumococcal up to date [  ];  Other:  Physical Exam: BP (!) 145/80   Pulse 60   Resp 20   Ht 5\' 8"  (1.727 m)   Wt 188 lb (85.3 kg)   SpO2 97% Comment: RA  BMI 28.59 kg/m   PHYSICAL EXAMINATION: General appearance: alert and cooperative Head: Normocephalic, without obvious abnormality, atraumatic Neck: no adenopathy, no carotid  bruit, no JVD, supple, symmetrical, trachea midline and thyroid not enlarged, symmetric, no tenderness/mass/nodules Lymph nodes: Cervical, supraclavicular, and axillary nodes normal. Resp: clear to auscultation bilaterally Back: symmetric, no curvature. ROM normal. No CVA tenderness. Cardio: regular rate and rhythm, S1, S2 normal, no murmur, click, rub or gallop GI: soft, non-tender; bowel sounds normal; no masses,  no organomegaly Extremities: extremities normal, atraumatic, no cyanosis or edema Neurologic: Grossly normal  Diagnostic Studies & Laboratory data:     Recent Radiology Findings:  Dg Lumbar Spine 2-3 Views  Result Date: 10/19/2017 CLINICAL DATA:  Low back pain. EXAM: LUMBAR SPINE - 2-3 VIEW COMPARISON:  CT 08/04/2017. FINDINGS: Lumbar spine numbered with the lowest segmented appearing lumbar shaped vertebrae on lateral view as L5. L3-L4 posterior and interbody fusion. Hardware intact. Anatomic alignment. Lumbar scoliosis concave right. No acute bony abnormality. Aortic atherosclerotic vascular calcification. IMPRESSION: 1. L4-L5 posterior interbody fusion. Hardware intact. Anatomic alignment. 2. Lumbar spine scoliosis concave right. Diffuse severe multilevel degenerative change. No acute bony abnormality. 3.  Aortoiliac atherosclerotic vascular disease. Electronically Signed   By: Marcello Moores  Register   On: 10/19/2017 07:26   Ct Angio Chest Aorta W &/or Wo Contrast  Result Date: 10/19/2017 CLINICAL DATA:  Follow-up examination for thoracic aortic aneurysm, annual surveillance. EXAM: CT ANGIOGRAPHY CHEST WITH CONTRAST TECHNIQUE: Multidetector CT imaging of the chest was performed using the standard protocol during bolus administration of intravenous contrast. Multiplanar CT image reconstructions and MIPs were obtained to evaluate the vascular anatomy. CONTRAST:  86mL ISOVUE-370 IOPAMIDOL (ISOVUE-370) INJECTION 76% COMPARISON:  Prior CTA from 08/24/2016 FINDINGS: Cardiovascular:  Atherosclerotic change seen throughout the intrathoracic aorta without evidence for dissection. Visualized great vessels patent. Sequelae of prior coronary artery bypass grafting. Aneurysmal dilatation of the aortic root measures 5.2 cm, previously 5.1 cm. Ascending thoracic aorta measures 4.2 cm, consistent with aneurysm, an unchanged previously measuring 4.2 cm. Transverse aortic arch measures 2.9 cm, previously 2.8 cm. Proximal descending thoracic aorta measures 3.5 cm, previously 3.4 cm. Mediastinum/Nodes: Thyroid normal. No pathologically enlarged mediastinal, hilar, or axillary lymph nodes identified. Esophagus is normal. Lungs/Pleura: Tracheobronchial tree intact and patent. Upper lobe predominant  centrilobular and paraseptal emphysema. Stable 5 mm nodules along the left major fissure, most likely small intra pulmonic lymph nodes given stability. No new pulmonary nodule or mass. No acute pulmonary disease. Upper Abdomen: Cholelithiasis noted. Remainder the visualized upper abdomen is unremarkable in demonstrates no acute finding. Musculoskeletal: No acute osseus abnormality. No worrisome lytic or blastic osseous lesions. External soft tissues demonstrate no acute finding. Review of the MIP images confirms the above findings. IMPRESSION: 1. Stable size of 4.2 cm ascending thoracic aortic aneurysm. Aneurysmal dilatation of the aortic root measures 5.2 cm, previously 5.1 cm. Recommend continued annual imaging followup by CTA or MRA. This recommendation follows 2010 ACCF/AHA/AATS/ACR/ASA/SCA/SCAI/SIR/STS/SVM Guidelines for the Diagnosis and Management of Patients with Thoracic Aortic Disease. Circulation. 2010; 121: Y301-S010. 2. Upper lobe predominant centrilobular and paraseptal emphysema with no other acute cardiopulmonary abnormality. 3. Cholelithiasis. Electronically Signed   By: Jeannine Boga M.D.   On: 10/19/2017 14:09     Ct Angio Chest Aorta W &/or Wo Contrast  Result Date:  08/24/2016 CLINICAL DATA:  Thoracic aortic aneurysm without rupture. EXAM: CT ANGIOGRAPHY CHEST WITH CONTRAST TECHNIQUE: Multidetector CT imaging of the chest was performed using the standard protocol during bolus administration of intravenous contrast. Multiplanar CT image reconstructions and MIPs were obtained to evaluate the vascular anatomy. CONTRAST:  100 mL of Isovue 370 intravenously. COMPARISON:  CT scan of May 03, 2015. FINDINGS: Cardiovascular: Atherosclerosis of thoracic aorta is noted without dissection. Status post coronary artery bypass graft. Great vessels are widely patent. Aneurysmal dilatation of aortic root is noted at 5.1 cm. Ascending thoracic aorta measures 4.2 cm consistent with aneurysm. Transverse aortic arch measures 2.8 cm. Proximal descending thoracic aorta measures 3.4 cm. Mediastinum/Nodes: No enlarged mediastinal, hilar, or axillary lymph nodes. Thyroid gland, trachea, and esophagus demonstrate no significant findings. Lungs/Pleura: No pneumothorax or pleural effusion is noted. Stable 5 mm nodule is seen along left major fissure best seen on image number 33 of series 8. Another stable 5 mm nodule is noted slightly more inferiorly and medially. Given the lack of change, no further follow-up required for these nodules. No acute pulmonary disease is noted. Upper Abdomen: Fatty infiltration of the liver. No other abnormality seen in the visualized portion of the abdomen. Musculoskeletal: No chest wall abnormality. No acute or significant osseous findings. Review of the MIP images confirms the above findings. IMPRESSION: 4.2 cm ascending thoracic aortic aneurysm. Aneurysmal dilatation of aortic root measured at 5.1 cm. Recommend annual imaging followup by CTA or MRA. This recommendation follows 2010 ACCF/AHA/AATS/ACR/ASA/SCA/SCAI/SIR/STS/SVM Guidelines for the Diagnosis and Management of Patients with Thoracic Aortic Disease. Circulation. 2010; 121: X323-F573. Stable left lung nodules  compared to prior exam. These can be considered benign at this point, with no further follow-up required. Probable fatty infiltration of liver. Electronically Signed   By: Marijo Conception, M.D.   On: 08/24/2016 09:30  Study Result   CLINICAL DATA:  Preoperative for CABG. Reported history of dilated aortic root on outside imaging.  EXAM: CT CHEST WITHOUT CONTRAST  TECHNIQUE: Multidetector CT imaging of the chest was performed following the standard protocol without IV contrast.  COMPARISON:  No prior chest CT.  Chest radiograph from 04/30/2015.  FINDINGS: Mediastinum/Nodes: Normal heart size. No pericardial fluid/thickening. Left anterior descending, left circumflex and right coronary atherosclerosis. There is aneurysmal dilatation of the aortic root and ascending aorta. The aortic root measures approximately 4.9 cm in diameter at the level of the sinuses of Valsalva. The thoracic aorta measures 4.4 cm in diameter  at the sino-tubular junction. The maximum ascending aortic diameter is 4.4 cm. The proximal descending thoracic aorta measures 3.6 cm in diameter. The thoracic aorta measures 2.9 cm diameter at the diaphragmatic hiatus. Normal caliber pulmonary arteries. Normal visualized thyroid. Normal esophagus. No pathologically enlarged axillary, mediastinal or gross hilar lymph nodes, noting limited sensitivity for the detection of hilar adenopathy on this noncontrast study.  Lungs/Pleura: No pneumothorax. No pleural effusion. Mild centrilobular and paraseptal emphysema and mild diffuse bronchial wall thickening. Subpleural 4 mm anterior left lower lobe pulmonary nodule associated with the left major fissure (series 3/ image 33). No acute consolidative airspace disease, additional significant pulmonary nodules or lung masses.  Upper abdomen: Diffuse hepatic steatosis.  Musculoskeletal: No aggressive appearing focal osseous lesions. Mild degenerative changes in the  thoracic spine.  IMPRESSION: 1. Aneurysmal dilatation of the aortic root (4.9 cm diameter) and ascending aorta (4.4 cm diameter). Ascending thoracic aortic aneurysm. Recommend semi-annual imaging followup by CTA or MRA. This recommendation follows 2010 ACCF/AHA/AATS/ACR/ASA/SCA/SCAI/SIR/STS/SVM Guidelines for the Diagnosis and Management of Patients With Thoracic Aortic Disease. Circulation. 2010; 121: H852-D782 2. Three-vessel coronary atherosclerosis. 3. Subpleural left lower lobe 4 mm pulmonary nodule, probably benign subpleural lymph node. A follow-up chest CT is advised in 12 months. This recommendation follows the consensus statement: Guidelines for Management of Small Pulmonary Nodules Detected on CT Scans: A Statement from the [place] as published in Radiology 2005; 237:395-400. 4. Mild centrilobular and paraseptal emphysema and mild diffuse bronchial wall thickening, which suggests COPD.   Electronically Signed   By: Ilona Sorrel M.D.   On: 05/03/2015 11:03      I have independently reviewed the above radiologic studies.  Recent Lab Findings: Lab Results  Component Value Date   WBC 6.8 08/04/2017   HGB 12.7 (L) 08/04/2017   HCT 39.6 08/04/2017   PLT 276 08/04/2017   GLUCOSE 118 (H) 08/31/2017   CHOL 130 07/16/2016   TRIG 308 (H) 07/16/2016   HDL 25 (L) 07/16/2016   LDLCALC 43 07/16/2016   ALT 14 08/31/2017   AST 15 08/31/2017   NA 140 08/31/2017   K 4.7 08/31/2017   CL 101 08/31/2017   CREATININE 0.84 08/31/2017   BUN 17 08/31/2017   CO2 23 08/31/2017   TSH 2.100 03/16/2016   INR 1.01 07/29/2016   HGBA1C 6.7 (H) 07/16/2016   Most recent echo is theTEE done at time of surgery 04/2015 LV EF: 55% -   60%  ------------------------------------------------------------------- Study Conclusions  - Left ventricle: Systolic function was normal. The estimated   ejection fraction was in the range of 55% to 60%. Wall motion was   normal; there  were no regional wall motion abnormalities. - Aortic valve: There was mild regurgitation, with a single jet   originating from the central coaptation point. - Mitral valve: No evidence of vegetation. - Left atrium: No evidence of thrombus in the atrial cavity or   appendage. - Atrial septum: No defect or patent foramen ovale was identified. - Tricuspid valve: No evidence of vegetation.  Intraoperative transesophageal echocardiography.  Birthdate: Patient birthdate: Jul 28, 1945.  Age:  Patient is 72 yr old.  Sex: Gender: male.  Study date:  Study date: 05/04/2015. Study time: 08:03 AM.  -------------------------------------------------------------------  ------------------------------------------------------------------- Left ventricle:  Systolic function was normal. The estimated ejection fraction was in the range of 55% to 60%. Wall motion was normal; there were no regional wall motion abnormalities.  ------------------------------------------------------------------- Aortic valve:   Trileaflet.  Doppler:  There was mild  regurgitation, with a single jet originating from the central coaptation point.  ------------------------------------------------------------------- Aorta:  Aortic root: The aortic root was mildly dilated.  ------------------------------------------------------------------- Mitral valve:   Structurally normal valve.   Leaflet separation was normal.  No evidence of vegetation.  Doppler:  There was no regurgitation.  ------------------------------------------------------------------- Left atrium:  The atrium was normal in size.  No evidence of thrombus in the atrial cavity or appendage.  ------------------------------------------------------------------- Atrial septum:  No defect or patent foramen ovale was identified.   ------------------------------------------------------------------- Right ventricle:  The cavity size was normal. Wall thickness  was normal. Systolic function was normal.  ------------------------------------------------------------------- Pulmonic valve:   Poorly visualized.  ------------------------------------------------------------------- Tricuspid valve:   Structurally normal valve.   Leaflet separation was normal.  No evidence of vegetation.  Doppler:  There was no regurgitation.  ------------------------------------------------------------------- Pericardium:  The pericardium was normal in appearance. There was no pericardial effusion.  ------------------------------------------------------------------- Pre bypass:  Post bypass: LV global systolic function was unchanged from the prior stage. The estimated LV ejection fraction was 55%. Aortic valve:  Mild regurgitation, with a centrally directed regurgitant jet. Pre bypass: Post bypass:  ------------------------------------------------------------------- Prepared and Electronically Authenticated by  Suzette Battiest, M.D. 2016-12-14T16:58:28  Assessment / Plan:   Stable CTA of the chest for mild dilation of the ascending aorta Status post coronary artery bypass grafting December 2016 without recurrent symptoms Plan CTA of the chest 1 year Patient was cautioned about strenuous lifting, need for good blood pressure control, and to avoid Cipro and like antibiotics  Patient was warned about not using Cipro and similar antibiotics. Recent studies have raised concern that fluoroquinolone antibiotics could be associated with an increased risk of aortic aneurysm Fluoroquinolones have non-antimicrobial properties that might jeopardise the integrity of the extracellular matrix of the vascular wall In a  propensity score matched cohort study in [place], there was a 66% increased rate of aortic aneurysm or dissection associated with oral fluoroquinolone use, compared with amoxicillin use, within a 60 day risk period from start of treatment   Eduardo Isaac MD      [address] [place] [address] Office [phone]   Beeper [phone]  10/19/2017 2:38 PM

## 2017-10-19 NOTE — Patient Instructions (Signed)

## 2017-10-31 DIAGNOSIS — Z981 Arthrodesis status: Secondary | ICD-10-CM | POA: Diagnosis not present

## 2017-10-31 DIAGNOSIS — M545 Low back pain: Secondary | ICD-10-CM | POA: Diagnosis not present

## 2017-11-08 ENCOUNTER — Ambulatory Visit: Payer: Medicare Other | Admitting: Internal Medicine

## 2017-11-10 ENCOUNTER — Ambulatory Visit (INDEPENDENT_AMBULATORY_CARE_PROVIDER_SITE_OTHER): Payer: Medicare Other | Admitting: Pediatrics

## 2017-11-10 ENCOUNTER — Encounter: Payer: Self-pay | Admitting: Pediatrics

## 2017-11-10 VITALS — BP 138/84 | HR 97 | Temp 98.7°F | Ht 68.0 in | Wt 190.4 lb

## 2017-11-10 DIAGNOSIS — R399 Unspecified symptoms and signs involving the genitourinary system: Secondary | ICD-10-CM

## 2017-11-10 DIAGNOSIS — N401 Enlarged prostate with lower urinary tract symptoms: Secondary | ICD-10-CM

## 2017-11-10 DIAGNOSIS — J449 Chronic obstructive pulmonary disease, unspecified: Secondary | ICD-10-CM | POA: Diagnosis not present

## 2017-11-10 DIAGNOSIS — R0602 Shortness of breath: Secondary | ICD-10-CM

## 2017-11-10 DIAGNOSIS — E119 Type 2 diabetes mellitus without complications: Secondary | ICD-10-CM

## 2017-11-10 DIAGNOSIS — R3911 Hesitancy of micturition: Secondary | ICD-10-CM

## 2017-11-10 DIAGNOSIS — R1011 Right upper quadrant pain: Secondary | ICD-10-CM | POA: Diagnosis not present

## 2017-11-10 LAB — URINALYSIS, COMPLETE
Bilirubin, UA: NEGATIVE
GLUCOSE, UA: NEGATIVE
Ketones, UA: NEGATIVE
Leukocytes, UA: NEGATIVE
NITRITE UA: NEGATIVE
PH UA: 6 (ref 5.0–7.5)
Protein, UA: NEGATIVE
RBC, UA: NEGATIVE
Specific Gravity, UA: 1.02 (ref 1.005–1.030)
Urobilinogen, Ur: 0.2 mg/dL (ref 0.2–1.0)

## 2017-11-10 LAB — MICROSCOPIC EXAMINATION
BACTERIA UA: NONE SEEN
Epithelial Cells (non renal): NONE SEEN /hpf (ref 0–10)
RBC MICROSCOPIC, UA: NONE SEEN /HPF (ref 0–2)
RENAL EPITHEL UA: NONE SEEN /HPF
WBC, UA: NONE SEEN /hpf (ref 0–5)

## 2017-11-10 MED ORDER — TAMSULOSIN HCL 0.4 MG PO CAPS: 0.4000 mg | ORAL_CAPSULE | Freq: Every day | ORAL | 3 refills | Status: DC

## 2017-11-10 NOTE — Progress Notes (Signed)
  Subjective:   Patient ID: Eduardo [name], male    DOB: [birth year], 72 y.o.   MRN: [id number] CC: Urinary Urgency  HPI: Eduardo [name] is a 72 y.o. male   Frequent urination. Abdomen continues to be tender all the time. Has felt like this for months. Has had multiple CT scans in past few months. Does have small gallstones present on two most recent CT scans. No inflammation. No dysuria. No fevers. Appetite has been ok.   Noticed more labored breathing at times, outside symptoms worsen. No chest pain with episodes. H/o CAD. H/o smoking.   BPH: taking tamsulosin, some urinary frequency as above  Relevant past medical, surgical, family and social history reviewed. Allergies and medications reviewed and updated. Social History   Tobacco Use  Smoking Status Former Smoker  . Packs/day: 0.75  . Years: 30.00  . Pack years: 22.50  . Types: Cigarettes  . Start date: 01/02/1961  . Last attempt to quit: 05/31/1987  . Years since quitting: 30.4  Smokeless Tobacco Never Used   ROS: Per HPI   Objective:    BP 138/84   Pulse 97   Temp 98.7 F (37.1 C) (Oral)   Ht '5\' 8"'$  (1.727 m)   Wt 190 lb 6.4 oz (86.4 kg)   SpO2 (!) 18%   BMI 28.95 kg/m   Wt Readings from Last 3 Encounters:  11/10/17 190 lb 6.4 oz (86.4 kg)  10/19/17 188 lb (85.3 kg)  10/18/17 188 lb 12.8 oz (85.6 kg)    Gen: NAD, alert, cooperative with exam, NCAT EYES: EOMI, no conjunctival injection, or no icterus ENT:  TMs pearly gray b/l, OP without erythema LYMPH: no cervical LAD CV: NRRR, normal S1/S2, no murmur, distal pulses 2+ b/l Resp: CTABL, no wheezes, normal WOB Abd: +BS, soft, tender with deep palpation throughout. Mildly distended.  Ext: No edema, warm MSK: normal muscle bulk  Assessment & Plan:  Eduardo [name] was seen today for urinary urgency.  Diagnoses and all orders for this visit:   Benign prostatic hyperplasia with urinary hesitancy Stable, cont below -     tamsulosin (FLOMAX) 0.4 MG CAPS  capsule; Take 1 capsule (0.4 mg total) by mouth daily.  Right upper quadrant abdominal pain Ongoing abd tenderness, will get u/s -     US Abdomen Limited RUQ; Future -     CBC with Differential/Platelet -     CMP14+EGFR  UTI symptoms -     Urine Culture; Future  Shortness of breath PFTs consistent with obstruction, trial of symbicort. Return precautions discussed -     PR BREATHING CAPACITY TEST  Other orders -     Microscopic Examination   Follow up plan: Return in about 3 months (around 02/10/2018). Assunta Found, MD [place]

## 2017-11-11 LAB — CMP14+EGFR
ALBUMIN: 4.4 g/dL (ref 3.5–4.8)
ALT: 18 IU/L (ref 0–44)
AST: 17 IU/L (ref 0–40)
Albumin/Globulin Ratio: 1.8 (ref 1.2–2.2)
Alkaline Phosphatase: 56 IU/L (ref 39–117)
BUN/Creatinine Ratio: 25 — ABNORMAL HIGH (ref 10–24)
BUN: 19 mg/dL (ref 8–27)
Bilirubin Total: 0.3 mg/dL (ref 0.0–1.2)
CALCIUM: 9.2 mg/dL (ref 8.6–10.2)
CO2: 24 mmol/L (ref 20–29)
CREATININE: 0.75 mg/dL — AB (ref 0.76–1.27)
Chloride: 104 mmol/L (ref 96–106)
GFR calc Af Amer: 106 mL/min/{1.73_m2} (ref >59)
GFR, EST NON AFRICAN AMERICAN: 92 mL/min/{1.73_m2} (ref >59)
GLOBULIN, TOTAL: 2.4 g/dL (ref 1.5–4.5)
Glucose: 86 mg/dL (ref 65–99)
Potassium: 5 mmol/L (ref 3.5–5.2)
SODIUM: 138 mmol/L (ref 134–144)
TOTAL PROTEIN: 6.8 g/dL (ref 6.0–8.5)

## 2017-11-11 LAB — CBC WITH DIFFERENTIAL/PLATELET
Basophils Absolute: 0 10*3/uL (ref 0.0–0.2)
Basos: 1 %
EOS (ABSOLUTE): 0.2 10*3/uL (ref 0.0–0.4)
EOS: 3 %
HEMATOCRIT: 40.6 % (ref 37.5–51.0)
HEMOGLOBIN: 13.1 g/dL (ref 13.0–17.7)
IMMATURE GRANULOCYTES: 0 %
Immature Grans (Abs): 0 10*3/uL (ref 0.0–0.1)
LYMPHS ABS: 1.6 10*3/uL (ref 0.7–3.1)
Lymphs: 33 %
MCH: 26.9 pg (ref 26.6–33.0)
MCHC: 32.3 g/dL (ref 31.5–35.7)
MCV: 83 fL (ref 79–97)
MONOCYTES: 12 %
Monocytes Absolute: 0.6 10*3/uL (ref 0.1–0.9)
NEUTROS PCT: 51 %
Neutrophils Absolute: 2.4 10*3/uL (ref 1.4–7.0)
Platelets: 235 10*3/uL (ref 150–450)
RBC: 4.87 x10E6/uL (ref 4.14–5.80)
RDW: 15.4 % (ref 12.3–15.4)
WBC: 4.7 10*3/uL (ref 3.4–10.8)

## 2017-11-15 MED ORDER — BUDESONIDE-FORMOTEROL FUMARATE 80-4.5 MCG/ACT IN AERO: 2.0000 | INHALATION_SPRAY | Freq: Two times a day (BID) | RESPIRATORY_TRACT | 3 refills | Status: AC

## 2017-11-22 ENCOUNTER — Ambulatory Visit (HOSPITAL_COMMUNITY): Payer: Medicare Other

## 2017-11-29 ENCOUNTER — Encounter: Payer: Self-pay | Admitting: *Deleted

## 2017-11-29 ENCOUNTER — Ambulatory Visit (HOSPITAL_COMMUNITY)
Admission: RE | Admit: 2017-11-29 | Discharge: 2017-11-29 | Disposition: A | Discharge status: Home / Self Care | Payer: Medicare Other | Source: Ambulatory Visit | Attending: Pediatrics | Admitting: Pediatrics

## 2017-11-29 DIAGNOSIS — K8020 Calculus of gallbladder without cholecystitis without obstruction: Secondary | ICD-10-CM | POA: Insufficient documentation

## 2017-11-29 DIAGNOSIS — R1011 Right upper quadrant pain: Secondary | ICD-10-CM

## 2017-11-29 DIAGNOSIS — K828 Other specified diseases of gallbladder: Secondary | ICD-10-CM | POA: Diagnosis not present

## 2017-11-29 NOTE — Progress Notes (Signed)
Patient examined in triage.  He has substantial swelling and erythema of the medial aspect of the sclera in the left eye.  Given reports of trauma to the eye, I contacted the eye specialist locally who will see him immediately.  I discussed with patient that he may need evaluation the emergency department.  Given absence of visual disturbance, I think that it is safe for him to see optometry for further evaluation rather than going to the emergency department.  Ashly M. Lajuana Ripple, [facility]

## 2017-11-29 NOTE — Progress Notes (Signed)
Pt walked in to clinic requesting appointment for obvious eye injury L eye swollen with bloody sclera Pt states a piece of metal hit him on the eyelid approximately 30 minutes prior No pain or change in vision Pt states he does not think metal is in eye Dr Lajuana Ripple evaluated pt's eye and contacted Dr Marin Comment, Optometrist Dr Marin Comment agreed to see pt emergently Pt familiar with Dr Marin Comment Pt sent directly to Dr Gus Puma office

## 2017-12-01 ENCOUNTER — Other Ambulatory Visit: Payer: Self-pay | Admitting: Nurse Practitioner

## 2017-12-01 DIAGNOSIS — K8020 Calculus of gallbladder without cholecystitis without obstruction: Secondary | ICD-10-CM

## 2017-12-01 NOTE — Progress Notes (Signed)
General surgeon

## 2017-12-18 ENCOUNTER — Encounter: Payer: Self-pay | Admitting: Surgery

## 2017-12-18 DIAGNOSIS — R1084 Generalized abdominal pain: Secondary | ICD-10-CM | POA: Diagnosis not present

## 2017-12-18 DIAGNOSIS — R197 Diarrhea, unspecified: Secondary | ICD-10-CM | POA: Diagnosis not present

## 2017-12-18 DIAGNOSIS — K8020 Calculus of gallbladder without cholecystitis without obstruction: Secondary | ICD-10-CM | POA: Diagnosis not present

## 2017-12-18 DIAGNOSIS — Z77098 Contact with and (suspected) exposure to other hazardous, chiefly nonmedicinal, chemicals: Secondary | ICD-10-CM | POA: Diagnosis not present

## 2017-12-18 DIAGNOSIS — R52 Pain, unspecified: Secondary | ICD-10-CM | POA: Diagnosis not present

## 2017-12-18 DIAGNOSIS — G8929 Other chronic pain: Secondary | ICD-10-CM | POA: Diagnosis not present

## 2017-12-20 ENCOUNTER — Other Ambulatory Visit: Payer: Self-pay | Admitting: Surgery

## 2017-12-20 DIAGNOSIS — R1084 Generalized abdominal pain: Secondary | ICD-10-CM

## 2017-12-21 ENCOUNTER — Other Ambulatory Visit (HOSPITAL_COMMUNITY): Payer: Self-pay | Admitting: Surgery

## 2017-12-21 DIAGNOSIS — R1084 Generalized abdominal pain: Secondary | ICD-10-CM

## 2017-12-27 ENCOUNTER — Encounter (HOSPITAL_COMMUNITY): Payer: Self-pay

## 2017-12-27 ENCOUNTER — Encounter (HOSPITAL_COMMUNITY)
Admission: RE | Admit: 2017-12-27 | Discharge: 2017-12-27 | Disposition: A | Discharge status: Home / Self Care | Payer: Medicare Other | Source: Ambulatory Visit | Attending: Surgery | Admitting: Surgery

## 2017-12-27 DIAGNOSIS — R1084 Generalized abdominal pain: Secondary | ICD-10-CM | POA: Insufficient documentation

## 2017-12-27 DIAGNOSIS — R109 Unspecified abdominal pain: Secondary | ICD-10-CM | POA: Diagnosis not present

## 2017-12-27 DIAGNOSIS — R110 Nausea: Secondary | ICD-10-CM | POA: Diagnosis not present

## 2017-12-27 MED ORDER — TECHNETIUM TC 99M SULFUR COLLOID
2.0000 | Freq: Once | INTRAVENOUS | Status: AC | PRN
  Administered 2017-12-27: 2 via INTRAVENOUS

## 2018-01-10 ENCOUNTER — Ambulatory Visit (INDEPENDENT_AMBULATORY_CARE_PROVIDER_SITE_OTHER): Payer: Medicare Other | Admitting: Internal Medicine

## 2018-01-10 ENCOUNTER — Encounter: Payer: Self-pay | Admitting: Internal Medicine

## 2018-01-10 ENCOUNTER — Encounter

## 2018-01-10 ENCOUNTER — Other Ambulatory Visit (INDEPENDENT_AMBULATORY_CARE_PROVIDER_SITE_OTHER): Payer: Medicare Other

## 2018-01-10 VITALS — BP 160/70 | HR 64 | Ht 67.0 in | Wt 189.6 lb

## 2018-01-10 DIAGNOSIS — R194 Change in bowel habit: Secondary | ICD-10-CM

## 2018-01-10 DIAGNOSIS — I712 Thoracic aortic aneurysm, without rupture: Secondary | ICD-10-CM | POA: Diagnosis not present

## 2018-01-10 DIAGNOSIS — R1084 Generalized abdominal pain: Secondary | ICD-10-CM | POA: Diagnosis not present

## 2018-01-10 DIAGNOSIS — Z77098 Contact with and (suspected) exposure to other hazardous, chiefly nonmedicinal, chemicals: Secondary | ICD-10-CM | POA: Diagnosis not present

## 2018-01-10 DIAGNOSIS — Z8619 Personal history of other infectious and parasitic diseases: Secondary | ICD-10-CM

## 2018-01-10 DIAGNOSIS — K8020 Calculus of gallbladder without cholecystitis without obstruction: Secondary | ICD-10-CM

## 2018-01-10 DIAGNOSIS — G629 Polyneuropathy, unspecified: Secondary | ICD-10-CM

## 2018-01-10 DIAGNOSIS — R159 Full incontinence of feces: Secondary | ICD-10-CM | POA: Diagnosis not present

## 2018-01-10 DIAGNOSIS — I7121 Aneurysm of the ascending aorta, without rupture: Secondary | ICD-10-CM

## 2018-01-10 DIAGNOSIS — Z8711 Personal history of peptic ulcer disease: Secondary | ICD-10-CM

## 2018-01-10 LAB — VITAMIN B12: VITAMIN B 12: 236 pg/mL (ref 211–911)

## 2018-01-10 MED ORDER — DULOXETINE HCL 60 MG PO CPEP: 60.0000 mg | ORAL_CAPSULE | Freq: Every day | ORAL | 3 refills | Status: DC

## 2018-01-10 NOTE — Patient Instructions (Addendum)
Your provider has requested that you go to the basement level for lab work before leaving today. Press "B" on the elevator. The lab is located at the first door on the left as you exit the elevator. _____________________________________________________________________________________ Stay on Duloxetine 60 mg daily for now; we may consider increasing it if needed. ______________________________________________________________________________________ Eduardo [name] have been scheduled for an endoscopy. Please follow written instructions given to you at your visit today. If you use inhalers (even only as needed), please bring them with you on the day of your procedure. Your physician has requested that you go to www.startemmi.com and enter the access code given to you at your visit today. This web site gives a general overview about your procedure. However, you should still follow specific instructions given to you by our office regarding your preparation for the procedure.  Stay on your Plavix for your procedure.  _____________________________________________________________ Stop omeprazole for 2 weeks. Use over the counter antacids (TUMS, mylanta). _______________________________________________________________________________________ Eduardo [name] have been scheduled to have an anorectal manometry at [facility] on 02/05/18 at 10:30 am. Please arrive 30 minutes prior to your appointment time for registration (1st floor of the hospital-admissions).  Please make certain to use 1 Fleets enema 2 hours prior to coming for your appointment. You can purchase Fleets enemas from the laxative section at your drug store. You should not eat anything during the two hours prior to the procedure. You may take regular medications with small sips of water at least 2 hours prior to the study.  Anorectal manometry is a test performed to evaluate patients with constipation or fecal incontinence. This test measures the pressures of  the anal sphincter muscles, the sensation in the rectum, and the neural reflexes that are needed for normal bowel movements.  THE PROCEDURE The test takes approximately 30 minutes to 1 hour. You will be asked to change into a hospital gown. A technician or nurse will explain the procedure to you, take a brief health history, and answer any questions you may have. The patient then lies on his or her left side. A small, flexible tube, about the size of a thermometer, with a balloon at the end is inserted into the rectum. The catheter is connected to a machine that measures the pressure. During the test, the small balloon attached to the catheter may be inflated in the rectum to assess the normal reflex pathways. The nurse or technician may also ask the person to squeeze, relax, and push at various times. The anal sphincter muscle pressures are measured during each of these maneuvers. To squeeze, the patient tightens the sphincter muscles as if trying to prevent anything from coming out. To push or bear down, the patient strains down as if trying to have a bowel movement.  I appreciate the opportunity to care for you. Silvano Rusk, MD, [facility]

## 2018-01-10 NOTE — Progress Notes (Addendum)
Eduardo [name] 72 y.o. 06-Apr-1946 [id number]  Assessment & Plan:   Encounter Diagnoses  Name Primary?  . Generalized abdominal pain Yes  . History of Helicobacter pylori infection   . History of peptic ulcer   . Full incontinence of feces   . Change in bowel habits   . Gallstones   . Agent orange exposure   . Neuropathy   . Ascending aortic aneurysm (HCC)    Very complicated set of chronic symptoms as above. I had suggested he increase duloxetine to 60 mg daily as we thought he was on 40 mg but turns out is at 60. Could consider increasing further but will wait to see what neurology thinks. Do think many of sxs sound neuropathic.  EGD to f/u ulcer and check for H. Pylori eradication and to evaluate persistent sxs. Off PPI and any H2B x 2 weeks before, prn antacids ok  Anorectal manometry to evaluate fecal incontinence.  Lab Results  Component Value Date   VITAMINB12 236 01/10/2018   B12 is checked and is low NL - will supplement Consider MMA level  Regarding gallstones his sxs do not seem consistent with symptomatic gallstones. He is quite concerned that a gallstone attack could lead to stress and rupture of his aortic aneurysm. I explained that I though that was very unlikely and we need to consider the stress of surgery and also perioperative complications in light of Dr. Johney Maine and my conclusions that it does not seem like symptomatic cholelithiasis. That said await the additional work-up before final conclusion on that.  With respect to his agent orange exposure and linked to his illness, I have found that early onset peripheral neuropathy is linked to agent orange exposure though what I can see is that the neuropathy should have started within a year of the exposure.  I do not know exactly when his problem started and I am not aware of autonomic type neuropathy which some of his symptoms seem to suggest being linked to that though I suppose we could not rule it out  as there were many unknowns there.  I do wonder if there is not some component of PTSD given the multitude of his symptoms and problems.  I appreciate the opportunity to care for this patient. CC: Eduardo Maize, MD Dr. Michael Boston Dr. Lanelle Bal   01/25/2018   EGD - ulcer persistent Mouth ulcers developed - new  Subjective:   Chief Complaint: abdominal pain, my insides are sore all the time  HPI The patient is here with his wife complaining of abdominal pain which is chronic for at least several years.  He has variable locations of abdominal pain on different days some days are better than others but every day for the most part is associated with burning abdominal pain.  It can be in any region of the abdomen, it prompted previous investigation as below.  Last year and EGD showed a 1.5 cm pyloric channel ulcer which was treated though I am not sure of the exact regimen, because of H. pylori.  He has not had a follow-up EGD.  He did seem to get some improvement after treatment of the ulcer.  He is on a PPI.  He takes duloxetine.  He has a history of agent orange exposure, and relates his abdominal pain to that as well as a multitude of other problems including neuropathy.  He says it is very difficult to explain his pain in many physicians do not understand  and do not seem to except.  In addition to the studies below he has had a multitude of blood tests as well as he thinks he has had a capsule endoscopy of the small bowel though I do not see that in our chart.  Some of his symptoms are postprandial but they can really occur at any time.  Sometimes sharp and crampy but frequently burning.  He has urgent defecation.  Metamucil does seem to help his constipation.  He has urinary incontinence and leakage on Flomax worse since starting that but that is helped his urinary stream so he is happy with that.  He will sometimes leak stool as well.  He had gallstones seen on imaging, and saw Dr.  gross who suggested he see me, as Dr. Johney Maine did not seem to think (I do not have the record) he had symptomatic cholelithiasis.  The does not seem to be a strong component of postprandial crampy or sharp pain in the fashion of biliary colic.  The patient is exasperated by his symptoms and would like some relief.  The patient is particularly concerned because he has an ascending aortic aneurysm and was told or has interpreted information from Dr. [name] that stress could cause the aneurysm and the rupture and the patient believes that if he has a gallbladder attack he is at increased risk of rupture of the aortic aneurysm.  This is driving him to ask for cholecystectomy.  CT scan abd/pelvis + contrast  06/2017 and 07/2017 Gallstones, aortic atherosclerosis  CT angio chest 09/2017 stable 4.2 cm ascending aortic aneurysm, emphysema, gallstones  RUQ [place] 11/2017 sludge and small gallstones and suggesting of adenomyomatosis or cholesterolosis, CBD 3 mm  Gastric emptying study  11/2017 - NL 4 hr study  Colonoscopy 03/2017 Dr. Laural Golden - diverticulosis and external hemorrhoids  EGD 12/2016 [facility] - 1.5 cm pyloric ulcer + H pylori Txed  Recent labs in June at primary care show a normal CBCComprehensive metabolic panel with mild elevation in BUN creatinine ratio only.  Urinalysis negative.April hemoglobin A1c was 6.5   PSA was 3.2 Allergies  Allergen Reactions  . Statins Other (See Comments)    Body aches  . Benazepril     hyperkalemia  . Ciprofloxacin     Per cardiac surgery   Current Meds  Medication Sig  . albuterol (PROVENTIL HFA;VENTOLIN HFA) 108 (90 BASE) MCG/ACT inhaler Inhale 1 puff into the lungs every 6 (six) hours as needed for wheezing or shortness of breath.   [name] aspirin EC 81 MG EC tablet Take 1 tablet (81 mg total) by mouth daily.  . budesonide-formoterol (SYMBICORT) 80-4.5 MCG/ACT inhaler Inhale 2 puffs into the lungs 2 (two) times daily.  . carvedilol (COREG) 12.5 MG tablet Take 12.5 mg  by mouth 2 (two) times daily.   . cetirizine (ZYRTEC) 10 MG tablet Take 1 tablet (10 mg total) by mouth daily.  . Cholecalciferol (VITAMIN D3) 5000 units CAPS Take 5,000 Units by mouth daily.  . clopidogrel (PLAVIX) 75 MG tablet Take 75 mg by mouth daily.  [name] ezetimibe (ZETIA) 10 MG tablet Take 10 mg by mouth at bedtime.   . fluticasone (FLONASE) 50 MCG/ACT nasal spray Place 2 sprays into both nostrils daily.  [name] gemfibrozil (LOPID) 600 MG tablet Take 600 mg by mouth 2 (two) times daily before a meal.  . metFORMIN (GLUCOPHAGE) 1000 MG tablet Take 1,000 mg by mouth 2 (two) times daily with a meal.  . naproxen sodium (ALEVE) 220 MG tablet  Take 440 mg by mouth daily as needed (pain).  . nitroGLYCERIN (NITROSTAT) 0.4 MG SL tablet Place 0.4 mg under the tongue every 5 (five) minutes as needed for chest pain.  [name] omeprazole (PRILOSEC) 20 MG capsule Take 20 mg by mouth daily.  . tamsulosin (FLOMAX) 0.4 MG CAPS capsule Take 1 capsule (0.4 mg total) by mouth daily.  [name] tetrahydrozoline 0.05 % ophthalmic solution Place 1-2 drops into both eyes 3 (three) times daily as needed (for dry/irritated eyes).  [name] tiotropium (SPIRIVA) 18 MCG inhalation capsule Place 18 mcg into inhaler and inhale daily as needed (for breathing).  . [DISCONTINUED] DULoxetine (CYMBALTA) 20 MG capsule Take 20 mg by mouth 2 (two) times daily.   Past Medical History:  Diagnosis Date  . Arthritis   . Bilateral carotid artery disease ([place]) 04/28/2015   1-39 percent bilateral stenosis noted on Doppler   . CAD (coronary artery disease)   . Cervical disc disease   . Chemical exposure    agent orange   . Colon polyps   . COPD (chronic obstructive pulmonary disease) ([place])   . Diabetes mellitus without complication ([place])   . Diverticulitis   . Gallstones   . GERD (gastroesophageal reflux disease)   . Hyperlipidemia   . Hypertension   . Lumbar disc disease   . Multiple gastric ulcers   . Obesity (BMI 30-39.9)   . Peripheral neuropathy     in all extremities  . PTSD (post-traumatic stress disorder)   . Sleep apnea    can't wear cpap  . Thoracic ascending aortic aneurysm [facility])    Past Surgical History:  Procedure Laterality Date  . CARDIAC CATHETERIZATION    . CARDIAC CATHETERIZATION  03/18/14   difficult to determine culprit vesel  . CARDIAC CATHETERIZATION N/A 04/29/2015   Procedure: Left Heart Cath and Coronary Angiography;  Surgeon: [name], MD;  Location: [place] [facility];  Service: Cardiovascular;  Laterality: N/A;  . CARPOMETACARPAL ([place]) FUSION OF THUMB Right 08/04/2016   Procedure: CARPOMETACARPAL [facility]) FUSION OF THUMB;  Surgeon: Iran Planas, MD;  Location: [place];  Service: Orthopedics;  Laterality: Right;  . CERVICAL SPINE SURGERY    . COLONOSCOPY N/A 04/05/2017   Procedure: COLONOSCOPY;  Surgeon: Rogene Houston, MD;  Location[facility];  Service: Endoscopy;  Laterality: N/A;  830  . CORONARY ARTERY BYPASS GRAFT N/A 05/04/2015   Procedure: CORONARY ARTERY BYPASS GRAFTING (CABG)  x four,  using left internal mammary artery, and bilateral thigh greater saphenous veins;  Surgeon: [name], MD;  Location: [place];  Service: Open Heart Surgery;  Laterality: N/A;  . EYE SURGERY Bilateral    cataract  . FOOT SURGERY Left    "little toe"  . I&D EXTREMITY Left 05/30/2013   Procedure: IRRIGATION AND DEBRIDEMENT Left Hand and Foreign body removal;  Surgeon: [name], MD;  Location: [place];  Service: Orthopedics;  Laterality: Left;  . LEFT HEART CATHETERIZATION WITH CORONARY ANGIOGRAM N/A 03/18/2014   Procedure: LEFT HEART CATHETERIZATION WITH CORONARY ANGIOGRAM;  Surgeon: [name], MD;  Location: [facility] CATH LAB;  Service: Cardiovascular;  Laterality: N/A;  . LUMBAR LAMINECTOMY    . PARTIAL COLECTOMY     For diverticulitis  . PTCA     unsuccesful  . REPLACEMENT TOTAL KNEE     right   . SHOULDER SURGERY Bilateral    x 2 (on both shoulder)_  . TEE WITHOUT CARDIOVERSION N/A 05/04/2015     Procedure: TRANSESOPHAGEAL  ECHOCARDIOGRAM (TEE);  Surgeon: [name], MD;  Location: [place];  Service: Open Heart Surgery;  Laterality: N/A;  . thumb surgery Left    Social History   Social History Narrative   Patient is married.  5 sons one daughter.  They are scattered throughout the country.  17 grandchildren.   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      For habits he quit smoking in 1989.  3 caffeinated beverages a day no alcohol or drug use.  No smokeless tobacco.   family history includes Diabetes in his father, paternal grandmother, and paternal uncle; Drug abuse in his son; Heart disease in his father, mother, paternal grandmother, paternal uncle, paternal uncle, and paternal uncle; Hypertension in his father, mother, paternal grandmother, and paternal uncle.   Review of Systems As per HPI.  Myalgia some intermittent epistaxis dyspnea insomnia and sleeping problems though he is changed how he takes his medications and seems that is working better, skin irritation.  All other review of systems negative or as per HPI.  Objective:   Physical Exam @BP  (!) 160/70   Pulse 64   Ht 5\' 7"  (1.702 m)   Wt 189 lb 9.6 oz (86 kg)   BMI 29.70 kg/m @  General:  Well-developed, well-nourished and in no acute distress Eyes:  anicteric. ENT:   Mouth and posterior pharynx free of lesions.  Neck:   supple w/o thyromegaly or mass.  Lungs: Clear to auscultation bilaterally. Heart:  S1S2, no rubs, murmurs, gallops. Abdomen:  soft, non-tender, no hepatosplenomegaly, hernia, or mass and BS+.   Diastasis recti Small umbilical hernia   Rectal:   Anoderm inspection revealed NL  Anal wink was absent Digital exam revealed normal resting tone and voluntary squeeze. No mass or rectocele present. Simulated defecation with valsalva revealed appropriate abdominal contraction and descent.    Lymph:  no cervical  or supraclavicular adenopathy. Extremities:   no edema, cyanosis or clubbing Skin   no rash. Neuro:  A&O x 3.  Psych:  appropriate mood and  Affect.   Data Reviewed: Thoracic surgery notes CT chest Labs in EMR CBC NL in 10/2017 as was CMET See HPI also.

## 2018-01-16 ENCOUNTER — Encounter: Payer: Self-pay | Admitting: Internal Medicine

## 2018-01-17 NOTE — Progress Notes (Signed)
B12 is low normal Take 1000 ug B12 daily

## 2018-01-25 ENCOUNTER — Encounter: Payer: Self-pay | Admitting: Internal Medicine

## 2018-01-25 ENCOUNTER — Ambulatory Visit (AMBULATORY_SURGERY_CENTER): Payer: Medicare Other | Admitting: Internal Medicine

## 2018-01-25 VITALS — BP 106/65 | HR 58 | Temp 98.7°F | Resp 18 | Ht 67.0 in | Wt 189.0 lb

## 2018-01-25 DIAGNOSIS — R1084 Generalized abdominal pain: Secondary | ICD-10-CM

## 2018-01-25 DIAGNOSIS — K2970 Gastritis, unspecified, without bleeding: Secondary | ICD-10-CM | POA: Diagnosis not present

## 2018-01-25 DIAGNOSIS — K2950 Unspecified chronic gastritis without bleeding: Secondary | ICD-10-CM | POA: Diagnosis not present

## 2018-01-25 DIAGNOSIS — R109 Unspecified abdominal pain: Secondary | ICD-10-CM | POA: Diagnosis not present

## 2018-01-25 MED ORDER — PANTOPRAZOLE SODIUM 40 MG PO TBEC: 40.0000 mg | DELAYED_RELEASE_TABLET | Freq: Two times a day (BID) | ORAL | 0 refills | Status: DC

## 2018-01-25 MED ORDER — SODIUM CHLORIDE 0.9 % IV SOLN: 500.0000 mL | Freq: Once | INTRAVENOUS | Status: DC

## 2018-01-25 NOTE — Progress Notes (Signed)
Called to room to assist during endoscopic procedure.  Patient ID and intended procedure confirmed with present staff. Received instructions for my participation in the procedure from the performing physician.  

## 2018-01-25 NOTE — Progress Notes (Signed)
To PACU, VSS. Report to [email or web address] 

## 2018-01-25 NOTE — Op Note (Addendum)
[place] Patient Name: Eduardo [name] Procedure Date: 01/25/2018 7:28 AM MRN: [id number] Endoscopist: Gatha Mayer , MD Age: 72 Referring MD:  Date of Birth: Dec 26, 1945 Gender: Male Account #: [id number] Procedure:                Upper GI endoscopy Indications:              Generalized abdominal pain, Follow-up of chronic                            gastric ulcer Medicines:                Propofol per Anesthesia, Monitored Anesthesia Care Procedure:                Pre-Anesthesia Assessment:                           - Prior to the procedure, a History and Physical                            was performed, and patient medications and                            allergies were reviewed. The patient's tolerance of                            previous anesthesia was also reviewed. The risks                            and benefits of the procedure and the sedation                            options and risks were discussed with the patient.                            All questions were answered, and informed consent                            was obtained. Prior Anticoagulants: The patient                            last took aspirin 1 day and Plavix (clopidogrel) 1                            day prior to the procedure. ASA Grade Assessment:                            III - A patient with severe systemic disease. After                            reviewing the risks and benefits, the patient was                            deemed in satisfactory condition to undergo the  procedure.                           After obtaining informed consent, the endoscope was                            passed under direct vision. Throughout the                            procedure, the patient's blood pressure, pulse, and                            oxygen saturations were monitored continuously. The                            Model GIF-[id number]) scope was  introduced                            through the mouth, and advanced to the second part                            of duodenum. The upper GI endoscopy was                            accomplished without difficulty. The patient                            tolerated the procedure well. Scope In: Scope Out: Findings:                 One non-bleeding cratered gastric ulcer with no                            stigmata of bleeding was found at the pylorus. The                            lesion was 8 mm in largest dimension. Biopsies were                            taken with a cold forceps for histology.                            Verification of patient identification for the                            specimen was done. Estimated blood loss was minimal.                           Scattered moderate inflammation characterized by                            erosions, erythema and granularity was found in the                            prepyloric region of the  stomach. Biopsies were                            taken with a cold forceps for histology.                            Verification of patient identification for the                            specimen was done. Estimated blood loss was minimal.                           The examined esophagus was mildly tortuous.                           The exam was otherwise without abnormality.                           The cardia and gastric fundus were normal on                            retroflexion. Complications:            No immediate complications. Estimated Blood Loss:     Estimated blood loss was minimal. Impression:               - Non-bleeding gastric ulcer with no stigmata of                            bleeding. Biopsied.                           - Gastritis. Biopsied.                           - Tortuous esophagus.                           - The examination was otherwise normal. Recommendation:           - Patient has a contact number  available for                            emergencies. The signs and symptoms of potential                            delayed complications were discussed with the                            patient. Return to normal activities tomorrow.                            Written discharge instructions were provided to the                            patient.                           -  Resume previous diet.                           - Continue present medications.                           - Resume aspirin today and Plavix (clopidogrel)                            today at prior doses.                           - change from omeprazole 20 mg qd to pantoprazole                            to 40 mg bid                           further plans after pathology review Gatha Mayer, MD 01/25/2018 7:54:14 AM This report has been signed electronically.

## 2018-01-25 NOTE — Patient Instructions (Addendum)
The ulcer is still there.  I do not think it is cancer or anything but bad but I took biopsies to make sure.  I want to change from omeprazole from 20 mg daily to pantoprazole40 mg twice a day and sent a prescription.  We will call with results and follow-up plans.  I appreciate the opportunity to care for you. Gatha Mayer, MD, FACG YOU HAD AN ENDOSCOPIC PROCEDURE TODAY AT [facility]:   Refer to the procedure report that was given to you for any specific questions about what was found during the examination.  If the procedure report does not answer your questions, please call your gastroenterologist to clarify.  If you requested that your care partner not be given the details of your procedure findings, then the procedure report has been included in a sealed envelope for you to review at your convenience later.  YOU SHOULD EXPECT: Some feelings of bloating in the abdomen. Passage of more gas than usual.  Walking can help get rid of the air that was put into your GI tract during the procedure and reduce the bloating. If you had a lower endoscopy (such as a colonoscopy or flexible sigmoidoscopy) you may notice spotting of blood in your stool or on the toilet paper. If you underwent a bowel prep for your procedure, you may not have a normal bowel movement for a few days.  Please Note:  You might notice some irritation and congestion in your nose or some drainage.  This is from the oxygen used during your procedure.  There is no need for concern and it should clear up in a day or so.  SYMPTOMS TO REPORT IMMEDIATELY:   Following upper endoscopy (EGD)  Vomiting of blood or coffee ground material  New chest pain or pain under the shoulder blades  Painful or persistently difficult swallowing  New shortness of breath  Fever of 100F or higher  Black, tarry-looking stools  For urgent or emergent issues, a gastroenterologist can be reached at any hour by calling [phone]  [phone].   DIET:  We do recommend a small meal at first, but then you may proceed to your regular diet.  Drink plenty of fluids but you should avoid alcoholic beverages for 24 hours.  ACTIVITY:  You should plan to take it easy for the rest of today and you should NOT DRIVE or use heavy machinery until tomorrow (because of the sedation medicines used during the test).    FOLLOW UP: Our staff will call the number listed on your records the next business day following your procedure to check on you and address any questions or concerns that you may have regarding the information given to you following your procedure. If we do not reach you, we will leave a message.  However, if you are feeling well and you are not experiencing any problems, there is no need to return our call.  We will assume that you have returned to your regular daily activities without incident.  If any biopsies were taken you will be contacted by phone or by letter within the next 1-3 weeks.  Please call us at [phone] if you have not heard about the biopsies in 3 weeks.    SIGNATURES/CONFIDENTIALITY: You and/or your care partner have signed paperwork which will be entered into your electronic medical record.  These signatures attest to the fact that that the information above on your After Visit Summary has been reviewed and is  understood.  Full responsibility of the confidentiality of this discharge information lies with you and/or your care-partner. 

## 2018-01-30 ENCOUNTER — Telehealth: Payer: Self-pay

## 2018-01-30 ENCOUNTER — Encounter: Payer: Self-pay | Admitting: *Deleted

## 2018-01-30 ENCOUNTER — Telehealth: Payer: Self-pay | Admitting: *Deleted

## 2018-01-30 ENCOUNTER — Ambulatory Visit (INDEPENDENT_AMBULATORY_CARE_PROVIDER_SITE_OTHER): Payer: Medicare Other | Admitting: *Deleted

## 2018-01-30 VITALS — BP 134/72 | HR 62 | Ht 64.0 in | Wt 188.0 lb

## 2018-01-30 DIAGNOSIS — Z0000 Encounter for general adult medical examination without abnormal findings: Secondary | ICD-10-CM | POA: Diagnosis not present

## 2018-01-30 NOTE — Progress Notes (Addendum)
Subjective:   Eduardo [name] is a 72 y.o. male who presents for a Medicare Annual Wellness Visit. Eduardo [name] lives at home with his wife. He has 3 biological children and 6 in total. He enjoys playing golf and is active around his home and yard. States that he doesn't still often. Receives some care from the [place].  Review of Systems    Patient reports that his overall health is unchanged compared to last year.  Cardiac Risk Factors include: advanced age (>50men, >1 women);diabetes mellitus;dyslipidemia;hypertension;male gender    All other systems negative       Current Medications (verified) Outpatient Encounter Medications as of 01/30/2018  Medication Sig  . albuterol (PROVENTIL HFA;VENTOLIN HFA) 108 (90 BASE) MCG/ACT inhaler Inhale 1 puff into the lungs every 6 (six) hours as needed for wheezing or shortness of breath.   [name] aspirin EC 81 MG EC tablet Take 1 tablet (81 mg total) by mouth daily.  . budesonide-formoterol (SYMBICORT) 80-4.5 MCG/ACT inhaler Inhale 2 puffs into the lungs 2 (two) times daily.  . carvedilol (COREG) 12.5 MG tablet Take 12.5 mg by mouth 2 (two) times daily.   . cetirizine (ZYRTEC) 10 MG tablet Take 1 tablet (10 mg total) by mouth daily.  . Cholecalciferol (VITAMIN D3) 5000 units CAPS Take 5,000 Units by mouth daily.  . clopidogrel (PLAVIX) 75 MG tablet Take 75 mg by mouth daily.  . DULoxetine (CYMBALTA) 60 MG capsule Take 1 capsule (60 mg total) by mouth daily.  [name] ezetimibe (ZETIA) 10 MG tablet Take 10 mg by mouth at bedtime.   . fluticasone (FLONASE) 50 MCG/ACT nasal spray Place 2 sprays into both nostrils daily.  [name] gemfibrozil (LOPID) 600 MG tablet Take 600 mg by mouth 2 (two) times daily before a meal.  . metFORMIN (GLUCOPHAGE) 1000 MG tablet Take 1,000 mg by mouth 2 (two) times daily with a meal.  . naproxen sodium (ALEVE) 220 MG tablet Take 440 mg by mouth daily as needed (pain).  . nitroGLYCERIN (NITROSTAT) 0.4 MG SL tablet Place  0.4 mg under the tongue every 5 (five) minutes as needed for chest pain.  . pantoprazole (PROTONIX) 40 MG tablet Take 1 tablet (40 mg total) by mouth 2 (two) times daily before a meal. Before breakfast and supper  . tamsulosin (FLOMAX) 0.4 MG CAPS capsule Take 1 capsule (0.4 mg total) by mouth daily.  [name] tetrahydrozoline 0.05 % ophthalmic solution Place 1-2 drops into both eyes 3 (three) times daily as needed (for dry/irritated eyes).  [name] tiotropium (SPIRIVA) 18 MCG inhalation capsule Place 18 mcg into inhaler and inhale daily as needed (for breathing).  . [DISCONTINUED] omeprazole (PRILOSEC) 20 MG capsule Take 20 mg by mouth daily.   Facility-Administered Encounter Medications as of 01/30/2018  Medication  . 0.9 %  sodium chloride infusion    Allergies (verified) Statins; Benazepril; and Ciprofloxacin   History: Past Medical History:  Diagnosis Date  . Arthritis   . Bilateral carotid artery disease ([place]) 04/28/2015   1-39 percent bilateral stenosis noted on Doppler   . CAD (coronary artery disease)   . Cervical disc disease   . Chemical exposure    agent orange   . Colon polyps   . COPD (chronic obstructive pulmonary disease) ([place])   . Diabetes mellitus without complication ([place])   . Diverticulitis   . Gallstones   . GERD (gastroesophageal reflux disease)   . Hyperlipidemia   . Hypertension   . Lumbar disc disease   .  Multiple gastric ulcers   . Obesity (BMI 30-39.9)   . Peripheral neuropathy    in all extremities  . PTSD (post-traumatic stress disorder)   . Sleep apnea    can't wear cpap  . Thoracic ascending aortic aneurysm [facility])    Past Surgical History:  Procedure Laterality Date  . CARDIAC CATHETERIZATION    . CARDIAC CATHETERIZATION  03/18/14   difficult to determine culprit vesel  . CARDIAC CATHETERIZATION N/A 04/29/2015   Procedure: Left Heart Cath and Coronary Angiography;  Surgeon: [name], MD;  Location: [place] CV LAB;  Service:  Cardiovascular;  Laterality: N/A;  . CARPOMETACARPAL ([place]) FUSION OF THUMB Right 08/04/2016   Procedure: CARPOMETACARPAL [facility]) FUSION OF THUMB;  Surgeon: Iran Planas, MD;  Location: [place];  Service: Orthopedics;  Laterality: Right;  . CERVICAL SPINE SURGERY    . COLONOSCOPY N/A 04/05/2017   Procedure: COLONOSCOPY;  Surgeon: Rogene Houston, MD;  [facility];  Service: Endoscopy;  Laterality: N/A;  830  . CORONARY ARTERY BYPASS GRAFT N/A 05/04/2015   Procedure: CORONARY ARTERY BYPASS GRAFTING ([facility])  x four,  using left internal mammary artery, and bilateral thigh greater saphenous veins;  Surgeon: [name], MD;  Location: [place];  Service: Open Heart Surgery;  Laterality: N/A;  . EYE SURGERY Bilateral    cataract  . FOOT SURGERY Left    "little toe"  . I&D EXTREMITY Left 05/30/2013   Procedure: IRRIGATION AND DEBRIDEMENT Left Hand and Foreign body removal;  Surgeon: [name], MD;  Location: [place];  Service: Orthopedics;  Laterality: Left;  . LEFT HEART CATHETERIZATION WITH CORONARY ANGIOGRAM N/A 03/18/2014   Procedure: LEFT HEART CATHETERIZATION WITH CORONARY ANGIOGRAM;  Surgeon: [name], MD;  Location[facility] C[facility] LAB;  Service: Cardiovascular;  Laterality: N/A;  . LUMBAR LAMINECTOMY    . PARTIAL COLECTOMY     For diverticulitis  . PTCA     unsuccesful  . REPLACEMENT TOTAL KNEE     right   . SHOULDER SURGERY Bilateral    x 2 (on both shoulder)_  . TEE WITHOUT CARDIOVERSION N/A 05/04/2015   Procedure: TRANSESOPHAGEAL ECHOCARDIOGRAM (TEE);  Surgeon: [name], MD;  Location: [place];  Service: Open Heart Surgery;  Laterality: N/A;  . thumb surgery Left    Family History  Problem Relation Age of Onset  . Diabetes Father   . Heart disease Father   . Hypertension Father   . Diabetes Paternal Grandmother   . Heart disease Paternal Grandmother   . Hypertension Paternal Grandmother   . Diabetes Paternal Uncle   . Heart disease Paternal Uncle   . Heart  disease Paternal Uncle   . Hypertension Paternal Uncle   . Heart disease Paternal Uncle   . Hypertension Mother   . Heart disease Mother   . Drug abuse Son   . Colon cancer Neg Hx   . Esophageal cancer Neg Hx   . Pancreatic cancer Neg Hx   . Stomach cancer Neg Hx   . Liver disease Neg Hx    Social History   Socioeconomic History  . Marital status: Married    Spouse name: Not on file  . Number of children: Not on file  . Years of education: Not on file  . Highest education level: Not on file  Occupational History  . Not on file  Social Needs  . Financial resource strain: Not on file  . Food insecurity:    Worry:  Not on file    Inability: Not on file  . Transportation needs:    Medical: Not on file    Non-medical: Not on file  Tobacco Use  . Smoking status: Former Smoker    Packs/day: 0.75    Years: 30.00    Pack years: 22.50    Types: Cigarettes    Start date: 1962    Last attempt to quit: 05/31/1987    Years since quitting: 30.6  . Smokeless tobacco: Never Used  Substance and Sexual Activity  . Alcohol use: No    Alcohol/week: 0.0 standard drinks  . Drug use: No  . Sexual activity: Yes  Lifestyle  . Physical activity:    Days per week: Not on file    Minutes per session: Not on file  . Stress: Not on file  Relationships  . Social connections:    Talks on phone: More than three times a week    Gets together: More than three times a week    Attends religious service: More than 4 times per year    Active member of club or organization: Yes    Attends meetings of clubs or organizations: More than 4 times per year    Relationship status: Married  Other Topics Concern  . Not on file  Social History Narrative   Patient is married.  5 sons one daughter.  They are scattered throughout the country.  17 grandchildren.   He is a [employer] of 23 years service and was in the [employer]   After the [employer] he ran a [place].      For habits he quit smoking in  1989.  3 caffeinated beverages a day no alcohol or drug use.  No smokeless tobacco.    Tobacco Use No.  Clinical Intake:                       Activities of Daily Living In your present state of health, do you have any difficulty performing the following activities: 01/30/2018  Hearing? N  Vision? N  Difficulty concentrating or making decisions? N  Walking or climbing stairs? N  Dressing or bathing? N  Doing errands, shopping? N  Preparing Food and eating ? N  Using the Toilet? N  In the past six months, have you accidently leaked urine? N  Do you have problems with loss of bowel control? N  Managing your Medications? N  Managing your Finances? N  Housekeeping or managing your Housekeeping? N  Some recent data might be hidden     Diet 3 meals a day. Eats a combination of home prepared meals and eating out.  Exercise Current Exercise Habits: The patient does not participate in regular exercise at present(works around home and yard and plays golf), Exercise limited by: None identified    Depression Screen PHQ 2/9 Scores 01/30/2018 11/10/2017 10/18/2017 08/31/2017  PHQ - 2 Score 0 0 0 0  PHQ- 9 Score - - - -     Fall Risk Fall Risk  01/30/2018 11/10/2017 10/18/2017 08/31/2017 07/28/2017  Falls in the past year? No No No No No    Safety Is the patient's home free of loose throw rugs in walkways, pet beds, electrical cords, etc?   yes      Grab bars in the bathroom? yes      Walkin shower? yes      Shower Seat? no      Handrails on the stairs?  yes      Adequate lighting?   yes  Patient Care Team: [name], MD as PCP - General (Pediatrics) Justice Britain, MD as Consulting Physician (Orthopedic Surgery) Herminio Commons, MD as Attending Physician (Cardiology) Iran Planas, MD as Consulting Physician (Orthopedic Surgery) Michael Boston, MD as Consulting Physician (General Surgery) Rogene Houston, MD as Consulting Physician  (Gastroenterology) Herminio Commons, MD as Attending Physician (Cardiology)  Hospitalizations, surgeries, and ER visits in previous 12 months No hospitalizations, ER visits, or surgeries this past year.  Objective:    Today's Vitals   01/30/18 1444  BP: 134/72  Pulse: 62  Weight: 188 lb (85.3 kg)  Height: 5\' 4"  (1.626 m)   Body mass index is 32.27 kg/m.  Advanced Directives 01/30/2018 08/04/2017 04/05/2017 01/31/2017 01/31/2017 01/25/2017 01/24/2017  Does Patient Have a Medical Advance Directive? No No No No No No No  Would patient like information on creating a medical advance directive? No - Patient declined No - Patient declined No - Patient declined - - No - Patient declined No - Patient declined    Hearing/Vision  No hearing or vision deficits noted during visit.   Cognitive Function: MMSE - Mini Mental State Exam 01/30/2018 01/24/2017  Orientation to time 5 5  Orientation to Place 5 5  Registration 3 3  Attention/ Calculation 5 5  Recall 2 3  Language- name 2 objects 2 2  Language- repeat 1 1  Language- follow 3 step command 3 3  Language- read & follow direction 1 1  Write a sentence 1 1  Copy design 1 1  Total score 29 30       Normal Cognitive Function Screening: Yes    Immunizations and Health Maintenance Immunization History  Administered Date(s) Administered  . Influenza, High Dose Seasonal PF 02/13/2017  . Influenza,inj,Quad PF,6+ Mos 03/09/2015, 12/29/2015  . Influenza-Unspecified 02/27/2013, 02/19/2014, 02/13/2017  . Tdap 05/28/2013   Health Maintenance Due  Topic Date Due  . OPHTHALMOLOGY EXAM  01/03/1956  . URINE MICROALBUMIN  01/03/1956  . PNA vac Low Risk Adult (1 of 2 - PCV13) 01/02/2010  . FOOT EXAM  01/06/2017  . INFLUENZA VACCINE  12/28/2017   Health Maintenance  Topic Date Due  . OPHTHALMOLOGY EXAM  01/03/1956  . URINE MICROALBUMIN  01/03/1956  . PNA vac Low Risk Adult (1 of 2 - PCV13) 01/02/2010  . FOOT EXAM  01/06/2017  . INFLUENZA  VACCINE  12/28/2017  . HEMOGLOBIN A1C  03/02/2018  . COLONOSCOPY  04/05/2022  . TETANUS/TDAP  05/29/2023  . Hepatitis C Screening  Completed        Assessment:   This is a routine wellness examination for Eduardo [name].      Plan:    Goals    . Exercise 150 minutes per week (moderate activity)        Health Maintenance Recommendations: Pneumococcal vaccine  Influenza vaccine  Diabetic foot exam Diabetic eye exam -states that he has had both pneumonia shots but I do not have a record  Additional Screening Recommendations: Lung: Low Dose CT Chest recommended if Age 15-80 years, 30 pack-year currently smoking OR have quit w/in 15years. Patient does not qualify. Hepatitis C Screening recommended: no    Keep f/u with [name], MD and any other specialty appointments you may have Continue current medications Move carefully to avoid falls. Use assistive devices like a cane or walker if needed. Aim for at least 150 minutes of moderate activity a week.  This can be chair exercises if necessary. Reading or puzzles are a good way to exercise your brain Stay connected with friends and family. Social connections are beneficial to your emotional and mental health.   I have personally reviewed and noted the following in the patient's chart:   . Medical and social history . Use of alcohol, tobacco or illicit drugs  . Current medications and supplements . Functional ability and status . Nutritional status . Physical activity . Advanced directives . List of other physicians . Hospitalizations, surgeries, and ER visits in previous 12 months . Vitals . Screenings to include cognitive, depression, and falls . Referrals and appointments  In addition, I have reviewed and discussed with patient certain preventive protocols, quality metrics, and best practice recommendations. A written personalized care plan for preventive services as well as general preventive health recommendations  were provided to patient.     Chong Sicilian, RN   01/31/2018   I have reviewed and agree with the above AWV documentation.  Claretta Fraise, M.D.

## 2018-01-30 NOTE — Telephone Encounter (Signed)
  Follow up Call-  Call back number 01/25/2018  Post procedure Call Back phone  # [phone]  Permission to leave phone message Yes  Some recent data might be hidden     Patient questions:  Message left to call us if necessary.

## 2018-01-30 NOTE — Telephone Encounter (Signed)
  Follow up Call-  Call back number 01/25/2018  Post procedure Call Back phone  # [phone]  Permission to leave phone message Yes  Some recent data might be hidden     Patient questions:  Do you have a fever, pain , or abdominal swelling? No. Pain Score  0 *  Have you tolerated food without any problems? Yes.    Have you been able to return to your normal activities? Yes.    Do you have any questions about your discharge instructions: Diet   No. Medications  No. Follow up visit  No.  Do you have questions or concerns about your Care? No.  Actions: * If pain score is 4 or above: No action needed, pain <4.  No problems noted per pt. maw

## 2018-01-30 NOTE — Patient Instructions (Signed)
  [name] , Thank you for taking time to come for your Medicare Wellness Visit. I appreciate your ongoing commitment to your health goals. Please review the following plan we discussed and let me know if I can assist you in the future.   These are the goals we discussed: Goals    . Exercise 150 minutes per week (moderate activity)       This is a list of the screening recommended for you and due dates:  Health Maintenance  Topic Date Due  . Eye exam for diabetics  01/03/1956  . Urine Protein Check  01/03/1956  . Pneumonia vaccines (1 of 2 - PCV13) 01/02/2010  . Complete foot exam   01/06/2017  . Flu Shot  12/28/2017  . Hemoglobin A1C  03/02/2018  . Colon Cancer Screening  04/05/2022  . Tetanus Vaccine  05/29/2023  .  Hepatitis C: One time screening is recommended by Center for Disease Control  (CDC) for  adults born from [birth year] through 1966.   Completed

## 2018-01-31 ENCOUNTER — Encounter: Payer: Self-pay | Admitting: *Deleted

## 2018-02-01 NOTE — Progress Notes (Signed)
[facility] - no recall/letter  Office - let him know no cancer or infection seen  1) Stay on bid pantoprazole 2) ask him to try IB gard 2 before meals also for his abd pain can offer samples if possible vs buy OTC 3) See me in about 2 mos

## 2018-02-05 ENCOUNTER — Ambulatory Visit (HOSPITAL_COMMUNITY)
Admission: RE | Admit: 2018-02-05 | Discharge: 2018-02-05 | Disposition: A | Discharge status: Home / Self Care | Payer: Medicare Other | Source: Ambulatory Visit | Attending: Gastroenterology | Admitting: Gastroenterology

## 2018-02-05 ENCOUNTER — Encounter: Payer: Self-pay | Admitting: Neurology

## 2018-02-05 ENCOUNTER — Encounter (HOSPITAL_COMMUNITY)
Admission: RE | Disposition: A | Discharge status: Home / Self Care | Payer: Self-pay | Source: Ambulatory Visit | Attending: Gastroenterology

## 2018-02-05 ENCOUNTER — Ambulatory Visit (INDEPENDENT_AMBULATORY_CARE_PROVIDER_SITE_OTHER): Payer: Medicare Other | Admitting: Neurology

## 2018-02-05 VITALS — BP 152/84 | HR 54 | Ht 67.0 in | Wt 184.2 lb

## 2018-02-05 DIAGNOSIS — R159 Full incontinence of feces: Secondary | ICD-10-CM | POA: Insufficient documentation

## 2018-02-05 DIAGNOSIS — E0843 Diabetes mellitus due to underlying condition with diabetic autonomic (poly)neuropathy: Secondary | ICD-10-CM

## 2018-02-05 DIAGNOSIS — K6289 Other specified diseases of anus and rectum: Secondary | ICD-10-CM | POA: Insufficient documentation

## 2018-02-05 DIAGNOSIS — Z9114 Patient's other noncompliance with medication regimen: Secondary | ICD-10-CM | POA: Diagnosis not present

## 2018-02-05 HISTORY — PX: ANAL RECTAL MANOMETRY: SHX6358

## 2018-02-05 SURGERY — MANOMETRY, ANORECTAL

## 2018-02-05 NOTE — Progress Notes (Signed)
Ano Rectal manometry performed per protocol.  Patient tolerated procedure without complications. Expelled balloon in 5 seconds during balloon expulsion test.  Dr. Silverio Decamp to interpret results.

## 2018-02-05 NOTE — Progress Notes (Signed)
[facility] [address] [place]. [place] [address] [phone]       OFFICE CONSULT NOTE  Eduardo [name] Date of Birth:  1945/09/29 Medical Record Number:  [id number]   Referring MD:  Alwyn Pea  Reason for Referral:  neuropathy  HPI: Eduardo [name] is a 72 year old pleasant Caucasian male seen today for initial office consultation visit. He is accompanied by his wife. Patient states that he was exposed to agent orange in the [place] War in 1968. Shortly thereafter he started having paresthesias in hands and feet. He was diagnosed with agent orange exposure and eventually got 100% disability through the [place] system. He states he has numbness in his hands and feet which is constant. He states this is not disabling or bothersome and he has gotten used to it. He has no feeling at the bottom of his feet and he often drops objects from his hands as he has decreased feeling in his fingers. He however feels that this has not progressed or gotten worse over the years. He denies any difficulty with balance, walking on any falls. He also has been diagnosed with diabetes for 20+ years. He however states that his diabetes control has always been good. His fasting sugars range from 95110. He cannot tell me what his last hemoglobin A1c was. The patient for the last 2 years has been bothered by abdominal pain which she describes as being constant. His deep-seated. At times it may be worse or better. He has had a lot of workup by his primary care physician and recently has been referred to Dr. Carlean Purl gastroenterologist. Upper GI endoscopy  On 8/29/2019was negative and he has a colonoscopy planned for later today. Patient denies any sharp shooting pain from his back moving along his trunk. He denies specifically burning sensation to his pain in his belly. The patient is been bothered by EKG noted a rash on his feet for the last few weeks and plans to see his primary care physician  for the same.  ROS:   14 system review of systems is positive for  Skin rash, itching, ringing in the ears, easy bruising and bleeding, shortness of breath, wheezing, snoring, urination problems, joint pain, aching muscles, allergies, runny nose, skin sensitivity, memory loss, weakness, decreased energy, change in appetite, snoring, restless legs and all other systems negative PMH:  Past Medical History:  Diagnosis Date  . Arthritis   . Bilateral carotid artery disease ([place]) 04/28/2015   1-39 percent bilateral stenosis noted on Doppler   . CAD (coronary artery disease)   . Cervical disc disease   . Chemical exposure    agent orange   . Colon polyps   . COPD (chronic obstructive pulmonary disease) ([place])   . Diabetes mellitus without complication ([place])   . Diverticulitis   . Gallstones   . GERD (gastroesophageal reflux disease)   . Hyperlipidemia   . Hypertension   . Lumbar disc disease   . Multiple gastric ulcers   . Obesity (BMI 30-39.9)   . Peripheral neuropathy    in all extremities  . PTSD (post-traumatic stress disorder)   . Sleep apnea    can't wear cpap  . Thoracic ascending aortic aneurysm [facility])     Social History:  Social History   Socioeconomic History  . Marital status: Married    Spouse name: Eduardo [name]  . Number of children: 6  . Years of education: Not on file  . Highest education level: 12th grade  Occupational  History  . Occupation: retired  Scientific laboratory technician  . Financial resource strain: Not on file  . Food insecurity:    Worry: Not on file    Inability: Not on file  . Transportation needs:    Medical: Not on file    Non-medical: Not on file  Tobacco Use  . Smoking status: Former Smoker    Packs/day: 0.75    Years: 30.00    Pack years: 22.50    Types: Cigarettes    Start date: 1962    Last attempt to quit: 05/31/1987    Years since quitting: 30.7  . Smokeless tobacco: Never Used  Substance and Sexual Activity  . Alcohol use: No    Alcohol/week: 0.0  standard drinks  . Drug use: No  . Sexual activity: Yes  Lifestyle  . Physical activity:    Days per week: Not on file    Minutes per session: Not on file  . Stress: Not on file  Relationships  . Social connections:    Talks on phone: More than three times a week    Gets together: More than three times a week    Attends religious service: More than 4 times per year    Active member of club or organization: Yes    Attends meetings of clubs or organizations: More than 4 times per year    Relationship status: Married  . Intimate partner violence:    Fear of current or ex partner: Not on file    Emotionally abused: Not on file    Physically abused: Not on file    Forced sexual activity: Not on file  Other Topics Concern  . Not on file  Social History Narrative   Patient is married.  5 sons one daughter.  They are scattered throughout the country.  17 grandchildren.   He is a [employer] of 23 years service and was in the [employer]   After the [employer] he ran a [employer] [place].      For habits he quit smoking in 1989.  3 caffeinated beverages a day no alcohol or drug use.  No smokeless tobacco.   Left handed (states he can use both if needed)   Caffeine 3 cups per day      Medications:   Current Outpatient Medications on File Prior to Visit  Medication Sig Dispense Refill  . albuterol (PROVENTIL HFA;VENTOLIN HFA) 108 (90 BASE) MCG/ACT inhaler Inhale 1 puff into the lungs every 6 (six) hours as needed for wheezing or shortness of breath.     [name] aspirin EC 81 MG EC tablet Take 1 tablet (81 mg total) by mouth daily.    . budesonide-formoterol (SYMBICORT) 80-4.5 MCG/ACT inhaler Inhale 2 puffs into the lungs 2 (two) times daily. 1 Inhaler 3  . carvedilol (COREG) 12.5 MG tablet Take 12.5 mg by mouth 2 (two) times daily.     . cetirizine (ZYRTEC) 10 MG tablet Take 1 tablet (10 mg total) by mouth daily. 30 tablet 11  . Cholecalciferol (VITAMIN D3) 5000 units CAPS Take 5,000 Units by mouth  daily.    . clopidogrel (PLAVIX) 75 MG tablet Take 75 mg by mouth daily.    . DULoxetine (CYMBALTA) 60 MG capsule Take 1 capsule (60 mg total) by mouth daily. 90 capsule 3  . ezetimibe (ZETIA) 10 MG tablet Take 10 mg by mouth at bedtime.     . fluticasone (FLONASE) 50 MCG/ACT nasal spray Place 2 sprays into both nostrils daily. [place]  g 6  . gemfibrozil (LOPID) 600 MG tablet Take 600 mg by mouth 2 (two) times daily before a meal.    . metFORMIN (GLUCOPHAGE) 1000 MG tablet Take 1,000 mg by mouth 2 (two) times daily with a meal.    . naproxen sodium (ALEVE) 220 MG tablet Take 440 mg by mouth daily as needed (pain).    . nitroGLYCERIN (NITROSTAT) 0.4 MG SL tablet Place 0.4 mg under the tongue every 5 (five) minutes as needed for chest pain.    . pantoprazole (PROTONIX) 40 MG tablet Take 1 tablet (40 mg total) by mouth 2 (two) times daily before a meal. Before breakfast and supper 180 tablet 0  . tamsulosin (FLOMAX) 0.4 MG CAPS capsule Take 1 capsule (0.4 mg total) by mouth daily. 90 capsule 3  . tetrahydrozoline 0.05 % ophthalmic solution Place 1-2 drops into both eyes 3 (three) times daily as needed (for dry/irritated eyes).    [name] tiotropium (SPIRIVA) 18 MCG inhalation capsule Place 18 mcg into inhaler and inhale daily as needed (for breathing).    . [DISCONTINUED] omeprazole (PRILOSEC) 20 MG capsule Take 20 mg by mouth daily.     Current Facility-Administered Medications on File Prior to Visit  Medication Dose Route Frequency Provider Last Rate Last Dose  . 0.9 %  sodium chloride infusion  500 mL Intravenous Once Gatha Mayer, MD        Allergies:   Allergies  Allergen Reactions  . Statins Other (See Comments)    Body aches  . Benazepril     hyperkalemia  . Ciprofloxacin     Per cardiac surgery    Physical Exam General: mildly obese elderly Caucasian male seated, in no evident distress Head: head normocephalic and atraumatic.   Neck: supple with no carotid or supraclavicular  bruits Cardiovascular: regular rate and rhythm, no murmurs Musculoskeletal: no deformity.surgical scars of knee replacement bilaterally Skin:  no rash/petichiae. Mild  Nodular  rash bilateral feet and hands Vascular:  Normal pulses all extremities  Neurologic Exam Mental Status: Awake and fully alert. Oriented to place and time. Recent and remote memory intact. Attention span, concentration and fund of knowledge appropriate. Mood and affect appropriate.  Cranial Nerves: Fundoscopic exam reveals sharp disc margins. Pupils equal, briskly reactive to light. Extraocular movements full without nystagmus. Visual fields full to confrontation. Hearing intact. Facial sensation intact. Face, tongue, palate moves normally and symmetrically.  Motor: Normal bulk and tone. Normal strength in all tested extremity muscles. Sensory.: intact to touch , pinprick , position and vibratory sensation.  Coordination: Rapid alternating movements normal in all extremities. Finger-to-nose and heel-to-shin performed accurately bilaterally. Gait and Station: Arises from chair without difficulty. Stance is normal. Gait demonstrates normal stride length and balance . Able to heel, toe and tandem walk without difficulty.  Reflexes: 1+ and symmetric except both ankle jerks are absent. Toes downgoing.       ASSESSMENT: 72 year old Caucasian male with long-standing history of hand and feet paresthesias   secondary to small fiber neuropathy exact etiology indeterminate whether related to toxin exposure to agent orange versus diabetes small fiber autonomic neuropathy. He also has history of chronic abdominal pain which seems unlikely to be of neuropathic origin    PLAN: I had a long discussion with the patient and his wife regarding his long-standing symptoms of numbness in his hands and feet is likely due to small fiber peripheral neuropathy etiology indeterminate whether related to exposure to agent orange or other toxin  versus diabetes.  Since his neuropathy complaints are more or less stable and not functionally disabling I do not beleive specific medication is necessary at the present time except tight control of diabetes. Check EMG nerve conduction studies and neuropathy panel labs. I do not believe the patient's abdominal pain is related to diabetic autonomic neuropathy and I advised him to continue workup for that with his gastroenterologist. Greater than 50% time during this 45 minute consultation visit was spent on counseling and coordination of care about his small fiber neuropathy and answering questionsHe may return for follow-up in the future only as necessary. Antony Contras, MD  [facility] [address] [place], [place] [address]  Phone [phone] Fax [phone] Note: This document was prepared with digital dictation and possible smart phrase technology. Any transcriptional errors that result from this process are unintentional. \

## 2018-02-05 NOTE — Patient Instructions (Signed)
I had a long discussion with the patient and his wife regarding his long-standing symptoms of numbness in his hands and feet is likely due to small fiber peripheral neuropathy etiology indeterminate whether related to exposure to agent orange or other toxin versus diabetes. Since his neuropathy complaints are more or less stable and not functionally disabling I do not beleive specific medication is necessary at the present time except tight control of diabetes. Check EMG nerve conduction studies and neuropathy panel labs. And do not believe the patient's abdominal pain is related to diabetic autonomic neuropathy and I advised him to continue workup for that with his gastroenterologist. He may return for follow-up in the future only as necessary.   Diabetic Neuropathy Diabetic neuropathy is a nerve disease or nerve damage that is caused by diabetes mellitus. About half of all people with diabetes mellitus have some form of nerve damage. Nerve damage is more common in those who have had diabetes mellitus for many years and who generally have not had good control of their blood sugar (glucose) level. Diabetic neuropathy is a common complication of diabetes mellitus. There are three common types of diabetic neuropathy and a fourth type that is less common and less understood:  Peripheral neuropathy-This is the most common type of diabetic neuropathy. It causes damage to the nerves of the feet and legs first and then eventually the hands and arms. The damage affects the ability to sense touch.  Autonomic neuropathy-This type causes damage to the autonomic nervous system, which controls the following functions: ? Heartbeat. ? Body temperature. ? Blood pressure. ? Urination. ? Digestion. ? Sweating. ? Sexual function.  Focal neuropathy-Focal neuropathy can be painful and unpredictable and occurs most often in older adults with diabetes mellitus. It involves a specific nerve or one area and often comes on  suddenly. It usually does not cause long-term problems.  Radiculoplexus neuropathy- Sometimes called lumbosacral radiculoplexus neuropathy, radiculoplexus neuropathy affects the nerves of the thighs, hips, buttocks, or legs. It is more common in people with type 2 diabetes mellitus and in older men. It is characterized by debilitating pain, weakness, and atrophy, usually in the thigh muscles.  What are the causes? The cause of peripheral, autonomic, and focal neuropathies is diabetes mellitus that is uncontrolled and high glucose levels. The cause of radiculoplexus neuropathy is unknown. However, it is thought to be caused by inflammation related to uncontrolled glucose levels. What are the signs or symptoms? Peripheral Neuropathy Peripheral neuropathy develops slowly over time. When the nerves of the feet and legs no longer work there may be:  Burning, stabbing, or aching pain in the legs or feet.  Inability to feel pressure or pain in your feet. This can lead to: ? Thick calluses over pressure areas. ? Pressure sores. ? Ulcers.  Foot deformities.  Reduced ability to feel temperature changes.  Muscle weakness.  Autonomic Neuropathy The symptoms of autonomic neuropathy vary depending on which nerves are affected. Symptoms may include:  Problems with digestion, such as: ? Feeling sick to your stomach (nausea). ? Vomiting. ? Bloating. ? Constipation. ? Diarrhea. ? Abdominal pain.  Difficulty with urination. This occurs if you lose your ability to sense when your bladder is full. Problems include: ? Urine leakage (incontinence). ? Inability to empty your bladder completely (retention).  Rapid or irregular heartbeat (palpitations).  Blood pressure drops when you stand up (orthostatic hypotension). When you stand up you may feel: ? Dizzy. ? Weak. ? Faint.  In men, inability to attain  and maintain an erection.  In women, vaginal dryness and problems with decreased sexual  desire and arousal.  Problems with body temperature regulation.  Increased or decreased sweating.  Focal Neuropathy  Abnormal eye movements or abnormal alignment of both eyes.  Weakness in the wrist.  Foot drop. This results in an inability to lift the foot properly and abnormal walking or foot movement.  Paralysis on one side of your face (Bell palsy).  Chest or abdominal pain. Radiculoplexus Neuropathy  Sudden, severe pain in your hip, thigh, or buttocks.  Weakness and wasting of thigh muscles.  Difficulty rising from a seated position.  Abdominal swelling.  Unexplained weight loss (usually more than 10 lb [4.5 kg]). How is this diagnosed? Peripheral Neuropathy Your senses may be tested. Sensory function testing can be done with:  A light touch using a monofilament.  A vibration with tuning fork.  A sharp sensation with a pin prick.  Other tests that can help diagnose neuropathy are:  Nerve conduction velocity. This test checks the transmission of an electrical current through a nerve.  Electromyography. This shows how muscles respond to electrical signals transmitted by nearby nerves.  Quantitative sensory testing. This is used to assess how your nerves respond to vibrations and changes in temperature.  Autonomic Neuropathy Diagnosis is often based on reported symptoms. Tell your health care provider if you experience:  Dizziness.  Constipation.  Diarrhea.  Inappropriate urination or inability to urinate.  Inability to get or maintain an erection.  Tests that may be done include:  Electrocardiography or Holter monitor. These are tests that can help show problems with the heart rate or heart rhythm.  An X-ray exam may be done.  Focal Neuropathy Diagnosis is made based on your symptoms and what your health care provider finds during your exam. Other tests may be done. They may include:  Nerve conduction velocities. This checks the transmission of  electrical current through a nerve.  Electromyography. This shows how muscles respond to electrical signals transmitted by nearby nerves.  Quantitative sensory testing. This test is used to assess how your nerves respond to vibration and changes in temperature.  Radiculoplexus Neuropathy  Often the first thing is to eliminate any other issue or problems that might be the cause, as there is no standard test for diagnosis.  X-ray exam of your spine and lumbar region.  Spinal tap to rule out cancer.  MRI to rule out other lesions. How is this treated? Once nerve damage occurs, it cannot be reversed. The goal of treatment is to keep the disease or nerve damage from getting worse and affecting more nerve fibers. Controlling your blood glucose level is the key. Most people with radiculoplexus neuropathy see at least a partial improvement over time. You will need to keep your blood glucose and HbA1c levels in the target range determined by your health care provider. Things that help control blood glucose levels include:  Blood glucose monitoring.  Meal planning.  Physical activity.  Diabetes medicine.  Over time, maintaining lower blood glucose levels helps lessen symptoms. Sometimes, prescription pain medicine is needed. Follow these instructions at home:  Do not smoke.  Keep your blood glucose level in the range that you and your health care provider have determined acceptable for you.  Keep your blood pressure level in the range that you and your health care provider have determined acceptable for you.  Eat a well-balanced diet.  Be physically active every day. Include strength training and balance exercises.  Protect your feet. ? Check your feet every day for sores, cuts, blisters, or signs of infection. ? Wear padded socks and supportive shoes. Use orthotic inserts, if necessary. ? Regularly check the insides of your shoes for worn spots. Make sure there are no rocks or other  items inside your shoes before you put them on. Contact a health care provider if:  You have burning, stabbing, or aching pain in the legs or feet.  You are unable to feel pressure or pain in your feet.  You develop problems with digestion such as: ? Nausea. ? Vomiting. ? Bloating. ? Constipation. ? Diarrhea. ? Abdominal pain.  You have difficulty with urination, such as: ? Incontinence. ? Retention.  You have palpitations.  You develop orthostatic hypotension. When you stand up you may feel: ? Dizzy. ? Weak. ? Faint.  You cannot attain and maintain an erection (in men).  You have vaginal dryness and problems with decreased sexual desire and arousal (in women).  You have severe pain in your thighs, legs, or buttocks.  You have unexplained weight loss. This information is not intended to replace advice given to you by your health care provider. Make sure you discuss any questions you have with your health care provider. Document Released: 07/25/2001 Document Revised: 10/22/2015 Document Reviewed: 10/25/2012 Elsevier Interactive Patient Education  2017 [employer].

## 2018-02-06 ENCOUNTER — Encounter (HOSPITAL_COMMUNITY): Payer: Self-pay | Admitting: Gastroenterology

## 2018-02-06 LAB — NEUROPATHY PANEL
A/G Ratio: 1.3 (ref 0.7–1.7)
Albumin ELP: 3.8 g/dL (ref 2.9–4.4)
Alpha 1: 0.3 g/dL (ref 0.0–0.4)
Alpha 2: 0.9 g/dL (ref 0.4–1.0)
Angio Convert Enzyme: 33 U/L (ref 14–82)
Anti Nuclear Antibody(ANA): POSITIVE — AB
Beta: 1.1 g/dL (ref 0.7–1.3)
Gamma Globulin: 0.7 g/dL (ref 0.4–1.8)
Globulin, Total: 3 g/dL (ref 2.2–3.9)
Rheumatoid fact SerPl-aCnc: <10 [IU]/mL (ref 0.0–13.9)
Sed Rate: 10 mm/h (ref 0–30)
TSH: 1.93 u[IU]/mL (ref 0.450–4.500)
Total Protein: 6.8 g/dL (ref 6.0–8.5)
Vit D, 25-Hydroxy: 56.1 ng/mL (ref 30.0–100.0)
Vitamin B-12: 1138 pg/mL (ref 232–1245)

## 2018-02-06 LAB — HEMOGLOBIN A1C
Est. average glucose Bld gHb Est-mCnc: 143 mg/dL
Hgb A1c MFr Bld: 6.6 % — ABNORMAL HIGH (ref 4.8–5.6)

## 2018-02-19 ENCOUNTER — Ambulatory Visit (INDEPENDENT_AMBULATORY_CARE_PROVIDER_SITE_OTHER): Payer: Medicare Other | Admitting: Pediatrics

## 2018-02-19 ENCOUNTER — Encounter: Payer: Self-pay | Admitting: Pediatrics

## 2018-02-19 VITALS — BP 148/82 | HR 89 | Temp 97.5°F | Ht 67.0 in | Wt 187.6 lb

## 2018-02-19 DIAGNOSIS — J439 Emphysema, unspecified: Secondary | ICD-10-CM | POA: Diagnosis not present

## 2018-02-19 DIAGNOSIS — E119 Type 2 diabetes mellitus without complications: Secondary | ICD-10-CM | POA: Diagnosis not present

## 2018-02-19 DIAGNOSIS — I10 Essential (primary) hypertension: Secondary | ICD-10-CM | POA: Diagnosis not present

## 2018-02-19 NOTE — Progress Notes (Signed)
  Subjective:   Patient ID: Eduardo [name], male    DOB: 11/18/1945, 72 y.o.   MRN: [id number] CC: Medical Management of Chronic Issues  HPI: Eduardo [name] is a 72 y.o. male   Abd pain: continues to have pain in abd, no worsening. Appetite has been up and down. Getting work up with GI, neurology. ?agent orange exposure  HTN: didn't take his medicines this morning. Usually takes daily, has BP cuff at home to check.  DM2: on metformin, avoiding sugary foods  Breathing has been fine, no wheezing, not needing albuterol  Relevant past medical, surgical, family and social history reviewed. Allergies and medications reviewed and updated. Social History   Tobacco Use  Smoking Status Former Smoker  . Packs/day: 0.75  . Years: 30.00  . Pack years: 22.50  . Types: Cigarettes  . Start date: 27  . Last attempt to quit: 05/31/1987  . Years since quitting: 30.7  Smokeless Tobacco Never Used   ROS: Per HPI   Objective:    BP (!) 148/82   Pulse 89   Temp (!) 97.5 F (36.4 C) (Oral)   Ht 5\' 7"  (1.702 m)   Wt 187 lb 9.6 oz (85.1 kg)   BMI 29.38 kg/m   Wt Readings from Last 3 Encounters:  02/19/18 187 lb 9.6 oz (85.1 kg)  02/05/18 184 lb 4 oz (83.6 kg)  02/05/18 184 lb 4 oz (83.6 kg)    Gen: NAD, alert, cooperative with exam, NCAT EYES: EOMI, no conjunctival injection, or no icterus CV: NRRR, normal S1/S2, no murmur Resp: CTABL, no wheezes, normal WOB Abd: +BS, soft, tender with palpation throughout, ND.  Ext: No edema, warm Neuro: Alert and oriented, strength equal b/l UE and LE, coordination grossly normal  Assessment & Plan:  Eduardo [name] was seen today for medical management of chronic issues.  Diagnoses and all orders for this visit:  Type 2 diabetes mellitus without complication, without long-term current use of insulin (HCC) A1c recently 6.6, cont to avoid sugary foods, decrease carb intake  Essential hypertension Elevated today, missed morning medicines.  Usually takes them. Check BPs at home, let me know if regularly elevated.  Pulmonary emphysema, unspecified emphysema type ([place]) Stable, cont symbicort  Follow up plan: 3 mo, sooner prn Assunta Found, MD [place]

## 2018-02-21 ENCOUNTER — Telehealth: Payer: Self-pay

## 2018-02-21 ENCOUNTER — Encounter: Payer: Self-pay | Admitting: Pediatrics

## 2018-02-21 DIAGNOSIS — R159 Full incontinence of feces: Secondary | ICD-10-CM

## 2018-02-21 NOTE — Telephone Encounter (Signed)
Notes recorded by Marval Regal, RN on 02/21/2018 at 9:08 AM EDT RN call patient about his lab work. Rn stated per Dr.Sethi the patient that lab work for reversible causes of neuropathy was pretty much unremarkable except for positive ANA which is likely a nonspecific finding. His diabetes control is slightly suboptimal and he needs to see primary physician for adjustment of diabetes medicines.Rn stated a copy of the labs will be fax or sent via epic to his PCP. The pt verbalized understanding. ------

## 2018-02-21 NOTE — Telephone Encounter (Signed)
LAbs sent via epic to DR.Assunta Found.

## 2018-02-21 NOTE — Telephone Encounter (Signed)
-----   Message from [name], MD sent at 02/20/2018  8:28 AM EDT ----- Eduardo [name] inform the patient that lab work for reversible causes of neuropathy was pretty much unremarkable except for positive ANA which is likely a nonspecific finding.  His diabetes control is slightly suboptimal and he needs to see primary physician for adjustment of diabetes medicines.

## 2018-02-28 ENCOUNTER — Other Ambulatory Visit: Payer: Self-pay

## 2018-02-28 DIAGNOSIS — R159 Full incontinence of feces: Secondary | ICD-10-CM

## 2018-02-28 DIAGNOSIS — K6289 Other specified diseases of anus and rectum: Secondary | ICD-10-CM

## 2018-02-28 NOTE — Progress Notes (Signed)
Internal anal sphincter is weak and not working properly  I recommend he try pelvic floor PT  Please explain that he will see physical therapists who will help him train rectal, anal and abdominal muscles to help with his problems.

## 2018-03-07 ENCOUNTER — Ambulatory Visit (INDEPENDENT_AMBULATORY_CARE_PROVIDER_SITE_OTHER): Payer: Medicare Other | Admitting: Neurology

## 2018-03-07 ENCOUNTER — Encounter: Payer: Self-pay | Admitting: Neurology

## 2018-03-07 DIAGNOSIS — E0843 Diabetes mellitus due to underlying condition with diabetic autonomic (poly)neuropathy: Secondary | ICD-10-CM | POA: Diagnosis not present

## 2018-03-07 DIAGNOSIS — E1142 Type 2 diabetes mellitus with diabetic polyneuropathy: Secondary | ICD-10-CM

## 2018-03-07 DIAGNOSIS — E1140 Type 2 diabetes mellitus with diabetic neuropathy, unspecified: Secondary | ICD-10-CM

## 2018-03-07 HISTORY — DX: Type 2 diabetes mellitus with diabetic neuropathy, unspecified: E11.40

## 2018-03-07 NOTE — Progress Notes (Signed)
Please refer to EMG and nerve conduction procedure note.  

## 2018-03-07 NOTE — Progress Notes (Addendum)
Please refer to EMG and nerve conduction procedure note.        [place]    Nerve / Sites Muscle Latency Ref. Amplitude Ref. Rel Amp Segments Distance Velocity Ref. Area    ms ms mV mV %  cm m/s m/s mVms  L Median - APB     Wrist APB 3.8 ?4.4 4.7 ?4.0 100 Wrist - APB 7   18.2     Upper arm APB 8.9  4.5  96.4 Upper arm - Wrist 25 49 ?49 17.4  L Ulnar - ADM     Wrist ADM 2.9 ?3.3 7.4 ?6.0 100 Wrist - ADM 7   23.9     B.Elbow ADM 7.3  6.7  91.3 B.Elbow - Wrist 22 49 ?49 22.2     A.Elbow ADM 9.8  6.4  95.2 A.Elbow - B.Elbow 12 49 ?49 21.7         A.Elbow - Wrist      L Peroneal - EDB     Ankle EDB 4.6 ?6.5 4.5 ?2.0 100 Ankle - EDB 9   14.4     Fib head EDB 11.0  4.0  88.9 Fib head - Ankle 28 44 ?44 14.2     Pop fossa EDB 13.7  4.2  107 Pop fossa - Fib head 12 45 ?44 15.3         Pop fossa - Ankle      L Tibial - AH     Ankle AH 4.7 ?5.8 8.0 ?4.0 100 Ankle - AH 9   19.8     Pop fossa AH 14.4  5.8  72.2 Pop fossa - Ankle 40 41 ?41 19.4             SNC    Nerve / Sites Rec. Site Peak Lat Ref.  Amp Ref. Segments Distance    ms ms V V  cm  L Sural - Ankle (Calf)     Calf Ankle 4.4 ?4.4 4 ?6 Calf - Ankle 14  L Superficial peroneal - Ankle     Lat leg Ankle 4.6 ?4.4 2 ?6 Lat leg - Ankle 14  L Median - Orthodromic (Dig II, Mid palm)     Dig II Wrist 3.2 ?3.4 10 ?10 Dig II - Wrist 13  L Ulnar - Orthodromic, (Dig V, Mid palm)     Dig V Wrist 3.0 ?3.1 5 ?5 Dig V - Wrist 64              F  Wave    Nerve F Lat Ref.   ms ms  L Tibial - AH 55.9 ?56.0  L Ulnar - ADM 31.9 ?32.0

## 2018-03-07 NOTE — Procedures (Signed)
      HISTORY:  Eduardo [name] is a 72 year old gentleman with a 5-year history of some numb sensations in the feet bilaterally, he also has issues with numbness in the hands.  He has had prior cervical and lumbar surgery in the past.  He is being evaluated for a possible neuropathy or a radiculopathy.  NERVE CONDUCTION STUDIES:  Nerve conduction studies were performed on the left upper extremity. The distal motor latencies and motor amplitudes for the median and ulnar nerves were within normal limits. The nerve conduction velocities for these nerves were also normal. The sensory latencies for the median and ulnar nerves were normal. The F wave latency for the ulnar nerve was within normal limits.  Nerve conduction studies were performed on the left lower extremity.  The distal motor latencies and motor amplitudes for the peroneal and posterior tibial nerves were within normal limits.  The nerve conduction velocities for these nerves are normal.  The sensory latencies for the peroneal and sural nerves were slightly prolonged.  The F-wave latency for the left posterior tibial nerve was normal.  EMG STUDIES:  EMG study was performed on the left lower extremity:  The tibialis anterior muscle reveals 2 to 4K motor units with slightly reduced recruitment. No fibrillations or positive waves were seen. The peroneus tertius muscle reveals 2 to 4K motor units with slightly reduced recruitment. No fibrillations or positive waves were seen. The medial gastrocnemius muscle reveals 1 to 3K motor units with full recruitment. No fibrillations or positive waves were seen. The vastus lateralis muscle reveals 2 to 4K motor units with full recruitment. No fibrillations or positive waves were seen. The iliopsoas muscle reveals 2 to 4K motor units with full recruitment. No fibrillations or positive waves were seen. The biceps femoris muscle (long head) reveals 2 to 4K motor units with full recruitment. No  fibrillations or positive waves were seen. The lumbosacral paraspinal muscles were tested at 3 levels, and revealed no abnormalities of insertional activity at all 3 levels tested. There was good relaxation.   IMPRESSION:  Nerve conduction studies done on the left upper and left lower extremities shows some sensory abnormalities in the lower extremity consistent with an early peripheral neuropathy.  EMG evaluation of the left lower extremity does not show evidence of an overlying lumbosacral radiculopathy.  Nerve conduction studies of the left upper extremity were normal.  Jill Alexanders MD 03/07/2018 1:39 PM  [facility] [address] [place], [place] [address]  Phone [phone] Fax [phone]

## 2018-03-12 ENCOUNTER — Telehealth: Payer: Self-pay

## 2018-03-12 NOTE — Telephone Encounter (Signed)
LEft vm for patient to call back about EMG results

## 2018-03-12 NOTE — Telephone Encounter (Signed)
-----   Message from [name], MD sent at 03/09/2018 12:05 PM EDT ----- Inform the patient that nerve conduction study shows a very mild neuropathy which is likely related to his diabetes.  No worrisome findings or treatment changes at this time

## 2018-03-13 NOTE — Telephone Encounter (Signed)
Left vm for patient to call back about EMG/NCV results. ------

## 2018-03-13 NOTE — Telephone Encounter (Signed)
Rn call patient about the nerve conduction test. It showed mild neuropathy likely related to diabetes. No worrisome or findings or change in treatment. PT verbalized understanding. ------

## 2018-03-13 NOTE — Telephone Encounter (Signed)
Patient returning your call.

## 2018-03-19 ENCOUNTER — Encounter: Payer: Self-pay | Admitting: Physical Therapy

## 2018-03-19 ENCOUNTER — Ambulatory Visit: Payer: Medicare Other | Attending: Internal Medicine | Admitting: Physical Therapy

## 2018-03-19 ENCOUNTER — Other Ambulatory Visit: Payer: Self-pay

## 2018-03-19 DIAGNOSIS — R1084 Generalized abdominal pain: Secondary | ICD-10-CM | POA: Diagnosis not present

## 2018-03-19 DIAGNOSIS — M62838 Other muscle spasm: Secondary | ICD-10-CM | POA: Diagnosis not present

## 2018-03-19 DIAGNOSIS — M6281 Muscle weakness (generalized): Secondary | ICD-10-CM | POA: Diagnosis not present

## 2018-03-19 NOTE — Therapy (Signed)
[facility] [address] [place], [place], [address] Phone: [phone]   Fax:  [phone]  Physical Therapy Evaluation  Patient Details  Name: Eduardo [name] MRN: [id number] Date of Birth: 1945-11-07 Referring Provider (PT): Dr. Silvano Rusk   Encounter Date: 03/19/2018  PT End of Session - 03/19/18 1114    Visit Number  1    Date for PT Re-Evaluation  05/14/18    Authorization Type  Medicare/Tricare    PT Start Time  0930    PT Stop Time  1015    PT Time Calculation (min)  45 min    Activity Tolerance  Patient tolerated treatment well;No increased pain    Behavior During Therapy  WFL for tasks assessed/performed       Past Medical History:  Diagnosis Date  . Arthritis   . Bilateral carotid artery disease ([place]) 04/28/2015   1-39 percent bilateral stenosis noted on Doppler   . CAD (coronary artery disease)   . Cervical disc disease   . Chemical exposure    agent orange   . Colon polyps   . COPD (chronic obstructive pulmonary disease) ([place])   . Diabetes mellitus without complication ([place])   . Diabetic neuropathy ([place]) 03/07/2018  . Diverticulitis   . Gallstones   . GERD (gastroesophageal reflux disease)   . Hyperlipidemia   . Hypertension   . Lumbar disc disease   . Multiple gastric ulcers   . Obesity (BMI 30-39.9)   . Peripheral neuropathy    in all extremities  . PTSD (post-traumatic stress disorder)   . Sleep apnea    can't wear cpap  . Thoracic ascending aortic aneurysm [facility])     Past Surgical History:  Procedure Laterality Date  . ANAL RECTAL MANOMETRY N/A 02/05/2018   Procedure: ANO RECTAL MANOMETRY;  Surgeon: [name], MD;  Location: [facility];  Service: Endoscopy;  Laterality: N/A;  . CARDIAC CATHETERIZATION    . CARDIAC CATHETERIZATION  03/18/14   difficult to determine culprit vesel  . CARDIAC CATHETERIZATION N/A 04/29/2015   Procedure: Left Heart Cath and  Coronary Angiography;  Surgeon: [name], MD;  Location: [place] CV LAB;  Service: Cardiovascular;  Laterality: N/A;  . CARPOMETACARPAL ([place]) FUSION OF THUMB Right 08/04/2016   Procedure: CARPOMETACARPAL [facility]) FUSION OF THUMB;  Surgeon: Iran Planas, MD;  Location: [place];  Service: Orthopedics;  Laterality: Right;  . CERVICAL SPINE SURGERY    . COLONOSCOPY N/A 04/05/2017   Procedure: COLONOSCOPY;  Surgeon: Rogene Houston, MD;  [facility];  Service: Endoscopy;  Laterality: N/A;  830  . CORONARY ARTERY BYPASS GRAFT N/A 05/04/2015   Procedure: CORONARY ARTERY BYPASS GRAFTING (CABG)  x four,  using left internal mammary artery, and bilateral thigh greater saphenous veins;  Surgeon: [name], MD;  Location: [place];  Service: Open Heart Surgery;  Laterality: N/A;  . EYE SURGERY Bilateral    cataract  . FOOT SURGERY Left    "little toe"  . I&D EXTREMITY Left 05/30/2013   Procedure: IRRIGATION AND DEBRIDEMENT Left Hand and Foreign body removal;  Surgeon: [name], MD;  Location: [place];  Service: Orthopedics;  Laterality: Left;  . LEFT HEART CATHETERIZATION WITH CORONARY ANGIOGRAM N/A 03/18/2014   Procedure: LEFT HEART CATHETERIZATION WITH CORONARY ANGIOGRAM;  Surgeon: [name], MD;  Location: [facility] CATH LAB;  Service: Cardiovascular;  Laterality: N/A;  . LUMBAR LAMINECTOMY    . PARTIAL COLECTOMY  For diverticulitis  . PTCA     unsuccesful  . REPLACEMENT TOTAL KNEE     right   . SHOULDER SURGERY Bilateral    x 2 (on both shoulder)_  . TEE WITHOUT CARDIOVERSION N/A 05/04/2015   Procedure: TRANSESOPHAGEAL ECHOCARDIOGRAM (TEE);  Surgeon: [name], MD;  Location: [place];  Service: Open Heart Surgery;  Laterality: N/A;  . thumb surgery Left     There were no vitals filed for this visit.   Subjective Assessment - 03/19/18 0934    Subjective  Abdomen has been very sore for 3-4 years. Patient has 3-4 bowel movements per day. Sometimes patient has  to run to the bathroom to have a bowel movement. Since January the bowels have been more type 4 or type 5. Last year patient had 1 bowel movement per day  at the same time of day and solid.     Patient Stated Goals  reduce abdominal pain, work on regular bowel movement    Currently in Pain?  Yes    Pain Score  4     Pain Location  Abdomen    Pain Orientation  Lower;Mid;Upper    Pain Descriptors / Indicators  Sore;Tender    Pain Type  Chronic pain    Pain Onset  More than a month ago    Pain Frequency  Constant    Aggravating Factors   touching the abdomem    Pain Relieving Factors  nothing    Multiple Pain Sites  No         [facility] PT Assessment - 03/19/18 0001      Assessment   Medical Diagnosis  R15.9 Full incontinence of feces; K62.89 Anal sphincter incompetence    Referring Provider (PT)  Dr. Silvano Rusk    Onset Date/Surgical Date  05/30/17    Prior Therapy  none      Precautions   Precautions  None      Restrictions   Weight Bearing Restrictions  No      Balance Screen   Has the patient fallen in the past 6 months  No    Has the patient had a decrease in activity level because of a fear of falling?   No    Is the patient reluctant to leave their home because of a fear of falling?   No      Home Film/video editor residence      Prior Function   Level of Independence  Independent    Vocation  Retired    Leisure  Agricultural consultant   Overall Cognitive Status  Within Functional Limits for tasks assessed      Observation/Other Assessments   Observations  increased abdominal distension    Skin Integrity  abdominal scar has decreased mobility      Posture/Postural Control   Posture/Postural Control  No significant limitations      ROM / Strength   AROM / PROM / Strength  AROM;PROM;Strength      AROM   Overall AROM Comments  full lumbar ROM      Strength   Overall Strength Comments  abdominal strength is 1/5      Palpation    Palpation comment  diaphragm and abdominals are tender, decreased lower rib cage movement                Objective measurements completed on examination: See above findings.    Pelvic Floor  Special Questions - 03/19/18 0001    Diastasis Recti  yes, curl up has a bulge along midline   2-3 finger width   Fecal incontinence  Yes       [facility] Adult PT Treatment/Exercise - 03/19/18 0001      Therapeutic Activites    Therapeutic Activities  ADL's    ADL's  breathing out wiht getting in and out of bed and sit to stand to reduce pressure of the abdominal diastasis      Manual Therapy   Manual Therapy  Soft tissue mobilization;Joint mobilization    Joint Mobilization  lower rib mobilization    Soft tissue mobilization  abdominal soft tissue work; diaphgram soft tissue work;              PT Education - 03/19/18 1116    Education Details  how to get in and out of bed correctly without straining the abdomen; sit to stand without holding his breath    Person(s) Educated  Patient    Methods  Explanation;Demonstration;Verbal cues    Comprehension  Returned demonstration;Verbalized understanding       PT Short Term Goals - 03/19/18 1122      PT SHORT TERM GOAL #1   Title  independent with initial HEP    Time  4    Period  Weeks    Status  New    Target Date  04/16/18      PT SHORT TERM GOAL #2   Title  understand how to perform daily tasks with decreased strain on the abdomen    Time  4    Period  Weeks    Status  New    Target Date  04/16/18      PT SHORT TERM GOAL #3   Title  abdominal pain decreased >/= 25% with daily tasks    Time  4    Period  Weeks    Status  New    Target Date  04/16/18        PT Long Term Goals - 03/19/18 1124      PT LONG TERM GOAL #1   Title  Ind with advanced HEP.    Time  8    Period  Weeks    Status  New    Target Date  05/14/18      PT LONG TERM GOAL #2   Title  abdominal strength >/= 3/5 due to reduction of diastasis  </= 1 finger width    Baseline  ----    Time  8    Period  Weeks    Status  New    Target Date  05/14/18      PT LONG TERM GOAL #3   Title  abdominal pain decreased >/= 75% due to improved scar mobility and abdominal contraction    Baseline  ----    Time  8    Period  Weeks    Status  New    Target Date  05/14/18      PT LONG TERM GOAL #4   Title  ability to wait 5 - 10 minutes after he has the urge to have a bowel movment due to improved abdominal pain    Baseline  --    Time  8    Period  Weeks    Status  New    Target Date  05/14/18      PT LONG TERM GOAL #5   Title  ------------  Baseline  ---------------             Plan - 03/19/18 1116    Clinical Impression Statement  Patient is a 72 year old male with abdominal pain the past year and more frequent stools per day.  Patient has to rush to the bathroom when he has the urge to have a bowel movement.  Abdominal strength is 2/5.  Tenderness throughout the abdomen. Tightness in the diaphgram and tenderness. Decreased mobility of the lower rib cage.  Diastasis Recti of the abdomen with significant bulge. Decreased mobility of the  abdominal scar. Patient will benefit from skilled therapy to reduce abdominal pain and improve excursion of the diaphgram, and improve strength.     History and Personal Factors relevant to plan of care:  Agent Orange exposure; COPD; Diabetic Neuropathy; PTSD; Cervical and lumbar surgery; bilateral shoulder surgery; Right TKR; open heart surgery    Clinical Presentation  Evolving    Clinical Presentation due to:  constant pain while performing daily tasks    Clinical Decision Making  Moderate    Rehab Potential  Excellent    Clinical Impairments Affecting Rehab Potential  Agent Orange exposure; COPD; Diabetic Neuropathy; PTSD; Cervical and lumbar surgery; bilateral shoulder surgery; Right TKR; open heart surgery    PT Frequency  1x / week    PT Duration  8 weeks    PT Treatment/Interventions   Cryotherapy;Electrical Stimulation;Moist Heat;Therapeutic activities;Therapeutic exercise;Patient/family education;Neuromuscular re-education;Manual techniques;Scar mobilization;Dry needling;Taping    PT Next Visit Plan  soft tissue work to abdomen, diaphragm, lower rib cage mobilization; abdominal strength; body mechanics to not put strain on the abdomen    Consulted and Agree with Plan of Care  Patient       Patient will benefit from skilled therapeutic intervention in order to improve the following deficits and impairments:  Pain, Increased fascial restricitons, Decreased mobility, Decreased scar mobility, Increased muscle spasms, Decreased activity tolerance, Decreased strength  Visit Diagnosis: Muscle weakness (generalized) - Plan: PT plan of care cert/re-cert  Other muscle spasm - Plan: PT plan of care cert/re-cert  Generalized abdominal pain - Plan: PT plan of care cert/re-cert     Problem List Patient Active Problem List   Diagnosis Date Noted  . Diabetic neuropathy ([place]) 03/07/2018  . Incontinence of feces   . Diarrhea 03/08/2017  . Expressive aphasia 07/15/2016  . Chest pain 09/26/2015  . Elevated TSH 05/08/2015  . S/P CABG x 4   . Bilateral carotid artery disease ([place]) 04/28/2015  . BMI 28.0-28.9,adult   . Unstable angina ([place]) 03/18/2014  . Type 2 diabetes mellitus without complication, without long-term current use of insulin ([place]) 09/21/2013  . COPD (chronic obstructive pulmonary disease) ([place]) 09/21/2013  . PTSD (post-traumatic stress disorder) 09/21/2013  . CAD (coronary artery disease) 04/16/2013  . HTN (hypertension) 04/16/2013  . Hyperlipidemia 04/16/2013    Earlie Counts, PT 03/19/18 11:29 AM   [facility] [address], [place], [place], [address] Phone: [phone]   Fax:  [phone]  Name: DAQUANN MERRIOTT MRN: [id number] Date of Birth: 06/13/1945

## 2018-03-27 ENCOUNTER — Ambulatory Visit: Payer: Medicare Other | Admitting: Cardiovascular Disease

## 2018-03-29 ENCOUNTER — Encounter: Payer: Self-pay | Admitting: Physical Therapy

## 2018-03-29 ENCOUNTER — Encounter: Payer: Self-pay | Admitting: Family

## 2018-03-29 ENCOUNTER — Ambulatory Visit (INDEPENDENT_AMBULATORY_CARE_PROVIDER_SITE_OTHER): Payer: Medicare Other | Admitting: Family

## 2018-03-29 ENCOUNTER — Ambulatory Visit: Payer: Medicare Other | Admitting: Physical Therapy

## 2018-03-29 VITALS — BP 138/74 | HR 67 | Temp 97.0°F | Ht 67.0 in | Wt 184.6 lb

## 2018-03-29 DIAGNOSIS — Z77098 Contact with and (suspected) exposure to other hazardous, chiefly nonmedicinal, chemicals: Secondary | ICD-10-CM

## 2018-03-29 DIAGNOSIS — G8929 Other chronic pain: Secondary | ICD-10-CM

## 2018-03-29 DIAGNOSIS — R1010 Upper abdominal pain, unspecified: Secondary | ICD-10-CM | POA: Diagnosis not present

## 2018-03-29 DIAGNOSIS — M6281 Muscle weakness (generalized): Secondary | ICD-10-CM

## 2018-03-29 DIAGNOSIS — M62838 Other muscle spasm: Secondary | ICD-10-CM | POA: Diagnosis not present

## 2018-03-29 DIAGNOSIS — R109 Unspecified abdominal pain: Secondary | ICD-10-CM | POA: Diagnosis not present

## 2018-03-29 DIAGNOSIS — R1084 Generalized abdominal pain: Secondary | ICD-10-CM | POA: Diagnosis not present

## 2018-03-29 NOTE — Therapy (Signed)
[facility] [address] [place], [place], [address] Phone: [phone]   Fax:  [phone]  Physical Therapy Treatment  Patient Details  Name: Eduardo [name] MRN: [id number] Date of Birth: 1945-06-12 Referring Provider (PT): Dr. Silvano Rusk   Encounter Date: 03/29/2018  PT End of Session - 03/29/18 1228    Visit Number  2    Date for PT Re-Evaluation  05/14/18    Authorization Type  Medicare/Tricare    PT Start Time  1145    PT Stop Time  1225    PT Time Calculation (min)  40 min    Activity Tolerance  Patient tolerated treatment well;No increased pain    Behavior During Therapy  WFL for tasks assessed/performed       Past Medical History:  Diagnosis Date  . Arthritis   . Bilateral carotid artery disease ([place]) 04/28/2015   1-39 percent bilateral stenosis noted on Doppler   . CAD (coronary artery disease)   . Cervical disc disease   . Chemical exposure    agent orange   . Colon polyps   . COPD (chronic obstructive pulmonary disease) ([place])   . Diabetes mellitus without complication ([place])   . Diabetic neuropathy ([place]) 03/07/2018  . Diverticulitis   . Gallstones   . GERD (gastroesophageal reflux disease)   . Hyperlipidemia   . Hypertension   . Lumbar disc disease   . Multiple gastric ulcers   . Obesity (BMI 30-39.9)   . Peripheral neuropathy    in all extremities  . PTSD (post-traumatic stress disorder)   . Sleep apnea    can't wear cpap  . Thoracic ascending aortic aneurysm [facility])     Past Surgical History:  Procedure Laterality Date  . ANAL RECTAL MANOMETRY N/A 02/05/2018   Procedure: ANO RECTAL MANOMETRY;  Surgeon: [name], MD;  [facility];  Service: Endoscopy;  Laterality: N/A;  . CARDIAC CATHETERIZATION    . CARDIAC CATHETERIZATION  03/18/14   difficult to determine culprit vesel  . CARDIAC CATHETERIZATION N/A 04/29/2015   Procedure: Left Heart Cath and Coronary  Angiography;  Surgeon: [name], MD;  Location: [place] CV LAB;  Service: Cardiovascular;  Laterality: N/A;  . CARPOMETACARPAL ([place]) FUSION OF THUMB Right 08/04/2016   Procedure: CARPOMETACARPAL [facility]) FUSION OF THUMB;  Surgeon: Iran Planas, MD;  Location: [place];  Service: Orthopedics;  Laterality: Right;  . CERVICAL SPINE SURGERY    . COLONOSCOPY N/A 04/05/2017   Procedure: COLONOSCOPY;  Surgeon: Rogene Houston, MD;  [facility];  Service: Endoscopy;  Laterality: N/A;  830  . CORONARY ARTERY BYPASS GRAFT N/A 05/04/2015   Procedure: CORONARY ARTERY BYPASS GRAFTING (CABG)  x four,  using left internal mammary artery, and bilateral thigh greater saphenous veins;  Surgeon: [name], MD;  Location: [place];  Service: Open Heart Surgery;  Laterality: N/A;  . EYE SURGERY Bilateral    cataract  . FOOT SURGERY Left    "little toe"  . I&D EXTREMITY Left 05/30/2013   Procedure: IRRIGATION AND DEBRIDEMENT Left Hand and Foreign body removal;  Surgeon: [name], MD;  Location: [place];  Service: Orthopedics;  Laterality: Left;  . LEFT HEART CATHETERIZATION WITH CORONARY ANGIOGRAM N/A 03/18/2014   Procedure: LEFT HEART CATHETERIZATION WITH CORONARY ANGIOGRAM;  Surgeon: [name], MD;  Location: [facility] CATH LAB;  Service: Cardiovascular;  Laterality: N/A;  . LUMBAR LAMINECTOMY    . PARTIAL COLECTOMY  For diverticulitis  . PTCA     unsuccesful  . REPLACEMENT TOTAL KNEE     right   . SHOULDER SURGERY Bilateral    x 2 (on both shoulder)_  . TEE WITHOUT CARDIOVERSION N/A 05/04/2015   Procedure: TRANSESOPHAGEAL ECHOCARDIOGRAM (TEE);  Surgeon: [name], MD;  Location: [place];  Service: Open Heart Surgery;  Laterality: N/A;  . thumb surgery Left     There were no vitals filed for this visit.  Subjective Assessment - 03/29/18 1146    Subjective  I felt better for the rest of the day after last visit. When I ate a bananna I had increased abdominal pain.      Patient Stated Goals  reduce abdominal pain, work on regular bowel movement    Currently in Pain?  Yes    Pain Score  9     Pain Location  Abdomen    Pain Orientation  Lower;Mid;Upper    Pain Descriptors / Indicators  Sore;Tender    Pain Type  Chronic pain    Pain Onset  More than a month ago    Pain Frequency  Constant    Aggravating Factors   touching abdomen    Pain Relieving Factors  nothing    Multiple Pain Sites  No         [facility] PT Assessment - 03/29/18 0001      Observation/Other Assessments   Observations  circumference of at umbilicus prior to treatment is 112 cm and after is 9 cm      Observation/Other Assessments-Edema    Edema  Circumferential                   [facility] Adult PT Treatment/Exercise - 03/29/18 0001      Therapeutic Activites    Therapeutic Activities  ADL's    ADL's  getting in and out of bed  and sit to stand without bearing down and bulging abdomen      Exercises   Exercises  Lumbar      Lumbar Exercises: Supine   Ab Set  10 reps;5 seconds   VC to breathe   Isometric Hip Flexion  20 reps;5 seconds   tactile cues to not dome abdomen     Manual Therapy   Manual Therapy  Soft tissue mobilization;Manual Lymphatic Drainage (MLD);Myofascial release    Manual therapy comments  used assistive device to release scar tissue of the abdominal scar and skin from the lininig of the abdomen    Soft tissue mobilization  abdominal soft tissue work; diaphgram soft tissue work;     Myofascial Release  along the lower abdomen and umbilicus    Manual Lymphatic Drainage (MLD)  lower abdomen, circular abdominal lymph drainage             PT Education - 03/29/18 1220    Education Details  Access Code: [facility]     Person(s) Educated  Patient    Methods  Explanation;Demonstration;Verbal cues;Handout    Comprehension  Returned demonstration;Verbalized understanding       PT Short Term Goals - 03/29/18 1230      PT SHORT TERM GOAL #1    Title  independent with initial HEP    Time  4    Period  Weeks    Status  On-going      PT SHORT TERM GOAL #2   Title  understand how to perform daily tasks with decreased strain on the abdomen    Time  4    Period  Weeks    Status  On-going      PT SHORT TERM GOAL #3   Title  abdominal pain decreased >/= 25% with daily tasks    Time  4    Period  Weeks    Status  On-going        PT Long Term Goals - 03/19/18 1124      PT LONG TERM GOAL #1   Title  Ind with advanced HEP.    Time  8    Period  Weeks    Status  New    Target Date  05/14/18      PT LONG TERM GOAL #2   Title  abdominal strength >/= 3/5 due to reduction of diastasis </= 1 finger width    Baseline  ----    Time  8    Period  Weeks    Status  New    Target Date  05/14/18      PT LONG TERM GOAL #3   Title  abdominal pain decreased >/= 75% due to improved scar mobility and abdominal contraction    Baseline  ----    Time  8    Period  Weeks    Status  New    Target Date  05/14/18      PT LONG TERM GOAL #4   Title  ability to wait 5 - 10 minutes after he has the urge to have a bowel movment due to improved abdominal pain    Baseline  --    Time  8    Period  Weeks    Status  New    Target Date  05/14/18      PT LONG TERM GOAL #5   Title  ------------    Baseline  ---------------            Plan - 03/29/18 1220    Clinical Impression Statement  During manual work the abdomen would have pinpoint pain on the right side of the umbilicus.  Patient abdominal circumference decreased by 3 cm from the beginnig of the session.  Patient had one area that was right side of umbilicus that would bulge more.  Patient had pain relief in the abdomen for several days since last session.  Patient will benefit from skilled therapy to reduce abdominal pain and improve excursion fo the diaphgram, improve strength.     Rehab Potential  Excellent    Clinical Impairments Affecting Rehab Potential  Agent Orange  exposure; COPD; Diabetic Neuropathy; PTSD; Cervical and lumbar surgery; bilateral shoulder surgery; Right TKR; open heart surgery    PT Frequency  1x / week    PT Duration  8 weeks    PT Treatment/Interventions  Cryotherapy;Electrical Stimulation;Moist Heat;Therapeutic activities;Therapeutic exercise;Patient/family education;Neuromuscular re-education;Manual techniques;Scar mobilization;Dry needling;Taping    PT Next Visit Plan  soft tissue work to abdomen, diaphragm, lower rib cage mobilization; abdominal strength; body mechanics to not put strain on the abdomen; see what MD says    PT Home Exercise Plan  Access Code: [facility]     Consulted and Agree with Plan of Care  Patient       Patient will benefit from skilled therapeutic intervention in order to improve the following deficits and impairments:  Pain, Increased fascial restricitons, Decreased mobility, Decreased scar mobility, Increased muscle spasms, Decreased activity tolerance, Decreased strength  Visit Diagnosis: Muscle weakness (generalized)  Other muscle spasm  Generalized abdominal pain     Problem  List Patient Active Problem List   Diagnosis Date Noted  . Diabetic neuropathy ([place]) 03/07/2018  . Incontinence of feces   . Diarrhea 03/08/2017  . Expressive aphasia 07/15/2016  . Chest pain 09/26/2015  . Elevated TSH 05/08/2015  . S/P CABG x 4   . Bilateral carotid artery disease ([place]) 04/28/2015  . BMI 28.0-28.9,adult   . Unstable angina ([place]) 03/18/2014  . Type 2 diabetes mellitus without complication, without long-term current use of insulin ([place]) 09/21/2013  . COPD (chronic obstructive pulmonary disease) ([place]) 09/21/2013  . PTSD (post-traumatic stress disorder) 09/21/2013  . CAD (coronary artery disease) 04/16/2013  . HTN (hypertension) 04/16/2013  . Hyperlipidemia 04/16/2013    Earlie Counts, PT 03/29/18 12:31 PM   [facility] [address] [place], [place], [address] Phone: [phone]   Fax:  [phone]  Name: Eduardo [name] MRN: [id number] Date of Birth: [birth year]

## 2018-03-29 NOTE — Progress Notes (Signed)
Subjective:    Patient ID: Eduardo [name], male    DOB: [birth year], 72 y.o.   MRN: [id number]  Chief Complaint  Patient presents with  . Abdominal Pain   Pt presents to the office today with chronic abdominal pain that has worsen over the last few months. He states this generalized abdominal pain started a few years ago. He has seen his PCP and GI for this and had multiple tests with no definite cause. He has had mulitple CT scan, US abdomen, and gastric emptying. It has showed small gallstones and combination of sludge.   States he has been exposed to agent orange exposure.  Abdominal Pain  This is a chronic problem. The current episode started more than 1 year ago. The onset quality is gradual. The problem occurs intermittently. The problem has been waxing and waning. The pain is located in the generalized abdominal region. The pain is moderate. The quality of the pain is sharp and cramping. Associated symptoms include diarrhea and nausea. Pertinent negatives include no belching, constipation, frequency or hematuria. The pain is aggravated by coughing. The pain is relieved by being still. He has tried antacids and proton pump inhibitors for the symptoms. The treatment provided no relief. Prior diagnostic workup includes GI consult and lower endoscopy. His past medical history is significant for gallstones.      Review of Systems  Gastrointestinal: Positive for abdominal pain, diarrhea and nausea. Negative for constipation.  Genitourinary: Negative for frequency and hematuria.  All other systems reviewed and are negative.      Objective:   Physical Exam  Constitutional: He is oriented to person, place, and time. He appears well-developed and well-nourished. No distress.  HENT:  Head: Normocephalic.  Right Ear: External ear normal.  Left Ear: External ear normal.  Mouth/Throat: Oropharynx is clear and moist.  Eyes: Pupils are equal, round, and reactive to light. Right eye  exhibits no discharge. Left eye exhibits no discharge.  Neck: Normal range of motion. Neck supple. No thyromegaly present.  Cardiovascular: Normal rate, regular rhythm, normal heart sounds and intact distal pulses.  No murmur heard. Pulmonary/Chest: Effort normal and breath sounds normal. No respiratory distress. He has no wheezes.  Abdominal: Soft. He exhibits no distension. Bowel sounds are increased. There is tenderness in the right upper quadrant and left upper quadrant.  Musculoskeletal: Normal range of motion. He exhibits no edema or tenderness.  Neurological: He is alert and oriented to person, place, and time. He has normal reflexes. No cranial nerve deficit.  Skin: Skin is warm and dry. No rash noted. No erythema.  Psychiatric: He has a normal mood and affect. His behavior is normal. Judgment and thought content normal.  Vitals reviewed.     BP 138/74   Pulse 67   Temp (!) 97 F (36.1 C) (Oral)   Ht _0  (1.702 m)   Wt 184 lb 9.6 oz (83.7 kg)   BMI 28.91 kg/m      Assessment & Plan:  Eduardo [name] comes in today with chief complaint of Abdominal Pain   Diagnosis and orders addressed:  1. Pain of upper abdomen - CBC with Differential/Platelet - Ambulatory referral to General Surgery - BMP8+EGFR  2. Chronic abdominal pain - CBC with Differential/Platelet - Ambulatory referral to General Surgery - BMP8+EGFR  3. Hx of agent Orange exposure - CBC with Differential/Platelet - Ambulatory referral to General Surgery - BMP8+EGFR  Labs pending Reviewed case with PCP who agrees with care GI notes  reviewed, gave patient number for GI and he will call today to schedule follow up appt!!  Continue PPI Will do referral to General Surgery to see if gallbladder can be removed Follow up with PCP  Greater than 30 mins spent with patient discussing hx and next treatments   Evelina Dun, FNP

## 2018-03-29 NOTE — Patient Instructions (Signed)
Access Code: [facility]  URL: [email or web address]  Date: 03/29/2018  Prepared by: Earlie Counts   Exercises  Supine Transversus Abdominis Bracing with Double Leg Fallout - 10 reps - 1 sets - hold 5 sec hold - 1x daily - 7x weekly  Hooklying Isometric Hip Flexion - 10 reps - 1 sets - 5 sec hold - 1x daily - 7x weekly  [place] [address], [place], [place] [address] Phone # [phone] Fax [phone]

## 2018-03-29 NOTE — Patient Instructions (Signed)

## 2018-03-30 ENCOUNTER — Ambulatory Visit: Payer: Medicare Other | Admitting: Pediatrics

## 2018-04-04 ENCOUNTER — Ambulatory Visit: Payer: Medicare Other | Attending: Internal Medicine | Admitting: Physical Therapy

## 2018-04-04 ENCOUNTER — Encounter: Payer: Self-pay | Admitting: Physical Therapy

## 2018-04-04 DIAGNOSIS — R1084 Generalized abdominal pain: Secondary | ICD-10-CM | POA: Diagnosis not present

## 2018-04-04 DIAGNOSIS — M6281 Muscle weakness (generalized): Secondary | ICD-10-CM | POA: Diagnosis not present

## 2018-04-04 DIAGNOSIS — M62838 Other muscle spasm: Secondary | ICD-10-CM | POA: Insufficient documentation

## 2018-04-04 NOTE — Therapy (Signed)
[facility] [address] [place], [place], [address] Phone: [phone]   Fax:  [phone]  Physical Therapy Treatment  Patient Details  Name: Eduardo [name] MRN: [id number] Date of Birth: 03-30-1946 Referring Provider (PT): Dr. Silvano Rusk   Encounter Date: 04/04/2018  PT End of Session - 04/04/18 1610    Visit Number  3    Date for PT Re-Evaluation  05/14/18    Authorization Type  Medicare/Tricare    PT Start Time  1530    PT Stop Time  1608    PT Time Calculation (min)  38 min    Activity Tolerance  Patient tolerated treatment well    Behavior During Therapy  [facility] for tasks assessed/performed       Past Medical History:  Diagnosis Date  . Arthritis   . Bilateral carotid artery disease ([place]) 04/28/2015   1-39 percent bilateral stenosis noted on Doppler   . CAD (coronary artery disease)   . Cervical disc disease   . Chemical exposure    agent orange   . Colon polyps   . COPD (chronic obstructive pulmonary disease) ([place])   . Diabetes mellitus without complication ([place])   . Diabetic neuropathy ([place]) 03/07/2018  . Diverticulitis   . Gallstones   . GERD (gastroesophageal reflux disease)   . Hyperlipidemia   . Hypertension   . Lumbar disc disease   . Multiple gastric ulcers   . Obesity (BMI 30-39.9)   . Peripheral neuropathy    in all extremities  . PTSD (post-traumatic stress disorder)   . Sleep apnea    can't wear cpap  . Thoracic ascending aortic aneurysm [facility])     Past Surgical History:  Procedure Laterality Date  . ANAL RECTAL MANOMETRY N/A 02/05/2018   Procedure: ANO RECTAL MANOMETRY;  Surgeon: [name], MD;  [facility];  Service: Endoscopy;  Laterality: N/A;  . CARDIAC CATHETERIZATION    . CARDIAC CATHETERIZATION  03/18/14   difficult to determine culprit vesel  . CARDIAC CATHETERIZATION N/A 04/29/2015   Procedure: Left Heart Cath and Coronary Angiography;   Surgeon: [name], MD;  Location: [place] CV LAB;  Service: Cardiovascular;  Laterality: N/A;  . CARPOMETACARPAL ([place]) FUSION OF THUMB Right 08/04/2016   Procedure: CARPOMETACARPAL [facility]) FUSION OF THUMB;  Surgeon: Iran Planas, MD;  Location: [place];  Service: Orthopedics;  Laterality: Right;  . CERVICAL SPINE SURGERY    . COLONOSCOPY N/A 04/05/2017   Procedure: COLONOSCOPY;  Surgeon: Rogene Houston, MD;  [facility];  Service: Endoscopy;  Laterality: N/A;  830  . CORONARY ARTERY BYPASS GRAFT N/A 05/04/2015   Procedure: CORONARY ARTERY BYPASS GRAFTING (CABG)  x four,  using left internal mammary artery, and bilateral thigh greater saphenous veins;  Surgeon: [name], MD;  Location: [place];  Service: Open Heart Surgery;  Laterality: N/A;  . EYE SURGERY Bilateral    cataract  . FOOT SURGERY Left    "little toe"  . I&D EXTREMITY Left 05/30/2013   Procedure: IRRIGATION AND DEBRIDEMENT Left Hand and Foreign body removal;  Surgeon: [name], MD;  Location: [place];  Service: Orthopedics;  Laterality: Left;  . LEFT HEART CATHETERIZATION WITH CORONARY ANGIOGRAM N/A 03/18/2014   Procedure: LEFT HEART CATHETERIZATION WITH CORONARY ANGIOGRAM;  Surgeon: [name], MD;  Location: [facility] CATH LAB;  Service: Cardiovascular;  Laterality: N/A;  . LUMBAR LAMINECTOMY    . PARTIAL COLECTOMY  For diverticulitis  . PTCA     unsuccesful  . REPLACEMENT TOTAL KNEE     right   . SHOULDER SURGERY Bilateral    x 2 (on both shoulder)_  . TEE WITHOUT CARDIOVERSION N/A 05/04/2015   Procedure: TRANSESOPHAGEAL ECHOCARDIOGRAM (TEE);  Surgeon: [name], MD;  Location: [place];  Service: Open Heart Surgery;  Laterality: N/A;  . thumb surgery Left     There were no vitals filed for this visit.  Subjective Assessment - 04/04/18 1533    Subjective  I have an appointment with the surgeon and have the gallbladder out with the gall stones. I still have the same pain.     Patient  Stated Goals  reduce abdominal pain, work on regular bowel movement    Currently in Pain?  Yes    Pain Score  9     Pain Location  Abdomen    Pain Orientation  Lower;Mid;Upper    Pain Descriptors / Indicators  Tender;Sore    Pain Type  Chronic pain    Pain Onset  More than a month ago    Pain Frequency  Constant    Aggravating Factors   touching abdomen    Pain Relieving Factors  nothing    Multiple Pain Sites  No                       [facility] Adult PT Treatment/Exercise - 04/04/18 0001      Therapeutic Activites    Therapeutic Activities  ADL's    ADL's  getting in and out of bed  and sit to stand without bearing down and bulging abdomen      Exercises   Exercises  Lumbar      Lumbar Exercises: Supine   Ab Set  10 reps;5 seconds   VC to breathe   Bent Knee Raise  20 reps;1 second   abdominal bracing   Isometric Hip Flexion  20 reps;5 seconds   tactile cues to not dome abdomen   Other Supine Lumbar Exercises  alternate shoulder flexion with abdominal bracing      Manual Therapy   Manual Therapy  Soft tissue mobilization;Manual Lymphatic Drainage (MLD);Myofascial release    Manual therapy comments  pin point tenderness located on the lateral anterior lower quadrant tenderness    Soft tissue mobilization  abdominal soft tissue work; diaphgram soft tissue work;     Myofascial Release  along the lower abdomen and umbilicus             PT Education - 04/04/18 1608    Education Details  Access Code: [facility]     Person(s) Educated  Patient    Methods  Explanation;Demonstration;Verbal cues;Handout    Comprehension  Returned demonstration;Verbalized understanding       PT Short Term Goals - 04/04/18 1613      PT SHORT TERM GOAL #1   Title  independent with initial HEP    Time  4    Period  Weeks    Status  Achieved      PT SHORT TERM GOAL #2   Title  understand how to perform daily tasks with decreased strain on the abdomen    Time  4    Period   Weeks    Status  On-going      PT SHORT TERM GOAL #3   Title  abdominal pain decreased >/= 25% with daily tasks    Time  4  Period  Weeks    Status  Achieved        PT Long Term Goals - 03/19/18 1124      PT LONG TERM GOAL #1   Title  Ind with advanced HEP.    Time  8    Period  Weeks    Status  New    Target Date  05/14/18      PT LONG TERM GOAL #2   Title  abdominal strength >/= 3/5 due to reduction of diastasis </= 1 finger width    Baseline  ----    Time  8    Period  Weeks    Status  New    Target Date  05/14/18      PT LONG TERM GOAL #3   Title  abdominal pain decreased >/= 75% due to improved scar mobility and abdominal contraction    Baseline  ----    Time  8    Period  Weeks    Status  New    Target Date  05/14/18      PT LONG TERM GOAL #4   Title  ability to wait 5 - 10 minutes after he has the urge to have a bowel movment due to improved abdominal pain    Baseline  --    Time  8    Period  Weeks    Status  New    Target Date  05/14/18      PT LONG TERM GOAL #5   Title  ------------    Baseline  ---------------            Plan - 04/04/18 1535    Clinical Impression Statement  Patient has not had bloating in abdominal area since treatment started.  Patient has pinpoint pain in lower lateral right side of abdomen that was consistent throughout the treatment. Patient abdomen was smaller after therapy.  Patient has increased in tissue mobility of the abdomen after treatment.  Patient still needs veral cues going from supine to sit to reduce the abdominal midline bulge with breath and abdominal bracing.  Patient is seeing a surgeon this month about his gall bladder.  Patient will benefit from skilled therapy to reduce abdominal pain and improve excursion of the diaphgram and strength of the core.     Rehab Potential  Excellent    Clinical Impairments Affecting Rehab Potential  Agent Orange exposure; COPD; Diabetic Neuropathy; PTSD; Cervical and lumbar  surgery; bilateral shoulder surgery; Right TKR; open heart surgery    PT Frequency  1x / week    PT Duration  8 weeks    PT Treatment/Interventions  Cryotherapy;Electrical Stimulation;Moist Heat;Therapeutic activities;Therapeutic exercise;Patient/family education;Neuromuscular re-education;Manual techniques;Scar mobilization;Dry needling;Taping    PT Next Visit Plan  soft tissue work to abdomen, diaphragm, lower rib cage mobilization; abdominal strength in quadruped and sitting  body mechanics to not put strain on the abdomen with getting in and out of the car    PT Home Exercise Plan  Access Code: [facility]     Recommended Other Services  MD signed the initial evaluation    Consulted and Agree with Plan of Care  Patient       Patient will benefit from skilled therapeutic intervention in order to improve the following deficits and impairments:  Pain, Increased fascial restricitons, Decreased mobility, Decreased scar mobility, Increased muscle spasms, Decreased activity tolerance, Decreased strength  Visit Diagnosis: Muscle weakness (generalized)  Other muscle spasm  Generalized abdominal pain  Problem List Patient Active Problem List   Diagnosis Date Noted  . Hx of agent Orange exposure 03/29/2018  . Diabetic neuropathy ([place]) 03/07/2018  . Incontinence of feces   . Diarrhea 03/08/2017  . Expressive aphasia 07/15/2016  . Chest pain 09/26/2015  . Elevated TSH 05/08/2015  . S/P CABG x 4   . Bilateral carotid artery disease ([place]) 04/28/2015  . BMI 28.0-28.9,adult   . Unstable angina ([place]) 03/18/2014  . Type 2 diabetes mellitus without complication, without long-term current use of insulin ([place]) 09/21/2013  . COPD (chronic obstructive pulmonary disease) ([place]) 09/21/2013  . PTSD (post-traumatic stress disorder) 09/21/2013  . CAD (coronary artery disease) 04/16/2013  . HTN (hypertension) 04/16/2013  . Hyperlipidemia 04/16/2013    Earlie Counts, PT 04/04/18 4:15 PM   [facility] [address] [place], [place], [address] Phone: [phone]   Fax:  [phone]  Name: Eduardo [name] MRN: [id number] Date of Birth: 1946/02/12

## 2018-04-04 NOTE — Patient Instructions (Signed)
Access Code: [facility]  URL: [email or web address]  Date: 04/04/2018  Prepared by: Earlie Counts   Exercises  Supine Transversus Abdominis Bracing with Double Leg Fallout - 10 reps - 1 sets - hold 5 sec hold - 1x daily - 7x weekly  Hooklying Isometric Hip Flexion - 10 reps - 1 sets - 5 sec hold - 1x daily - 7x weekly  Supine March - 10 reps - 1 sets - 1x daily - 7x weekly  Supine Alternating Shoulder Flexion - 10 reps - 2 sets - 1x daily - 7x weekly   Patient Education  Log Roll  Rolling From Your Back to [place] [address] [place], [place] [address] Phone # [phone] Fax [phone]

## 2018-04-10 ENCOUNTER — Ambulatory Visit: Payer: Medicare Other | Admitting: General Surgery

## 2018-04-11 ENCOUNTER — Encounter: Payer: Self-pay | Admitting: Physical Therapy

## 2018-04-11 ENCOUNTER — Ambulatory Visit: Payer: Medicare Other | Admitting: Physical Therapy

## 2018-04-11 DIAGNOSIS — M62838 Other muscle spasm: Secondary | ICD-10-CM | POA: Diagnosis not present

## 2018-04-11 DIAGNOSIS — R1084 Generalized abdominal pain: Secondary | ICD-10-CM | POA: Diagnosis not present

## 2018-04-11 DIAGNOSIS — M6281 Muscle weakness (generalized): Secondary | ICD-10-CM | POA: Diagnosis not present

## 2018-04-11 NOTE — Therapy (Signed)
[facility] [address] [place], [name], [address] Phone: [phone]   Fax:  [phone]  Physical Therapy Treatment  Patient Details  Name: Edu[name] MRN: [id number] Date of Birth: [birth year] Referring Provider (PT): Dr. Silvano Rusk   Encounter Date: 04/11/2018  PT End of Session - 04/11/18 1149    Visit Number  4    Date for PT Re-Evaluation  05/14/18    Authorization Type  Medicare/Tricare    PT Start Time  1100    PT Stop Time  1140    PT Time Calculation (min)  40 min    Activity Tolerance  Patient tolerated treatment well;No increased pain    Behavior During Therapy  WFL for tasks assessed/performed       Past Medical History:  Diagnosis Date  . Arthritis   . Bilateral carotid artery disease ([place]) 04/28/2015   1-39 percent bilateral stenosis noted on Doppler   . CAD (coronary artery disease)   . Cervical disc disease   . Chemical exposure    agent orange   . Colon polyps   . COPD (chronic obstructive pulmonary disease) ([place])   . Diabetes mellitus without complication ([place])   . Diabetic neuropathy ([place]) 03/07/2018  . Diverticulitis   . Gallstones   . GERD (gastroesophageal reflux disease)   . Hyperlipidemia   . Hypertension   . Lumbar disc disease   . Multiple gastric ulcers   . Obesity (BMI 30-39.9)   . Peripheral neuropathy    in all extremities  . PTSD (post-traumatic stress disorder)   . Sleep apnea    can't wear cpap  . Thoracic ascending aortic aneurysm [facility])     Past Surgical History:  Procedure Laterality Date  . ANAL RECTAL MANOMETRY N/A 02/05/2018   Procedure: ANO RECTAL MANOMETRY;  Surgeon: [name], MD;  Location: [facility];  Service: Endoscopy;  Laterality: N/A;  . CARDIAC CATHETERIZATION    . CARDIAC CATHETERIZATION  03/18/14   difficult to determine culprit vesel  . CARDIAC CATHETERIZATION N/A 04/29/2015   Procedure: Left Heart Cath and Coronary  Angiography;  Surgeon: [name], MD;  Location: [place] CV LAB;  Service: Cardiovascular;  Laterality: N/A;  . CARPOMETACARPAL ([place]) FUSION OF THUMB Right 08/04/2016   Procedure: CARPOMETACARPAL [facility]) FUSION OF THUMB;  Surgeon: Iran Planas, MD;  Location: [place];  Service: Orthopedics;  Laterality: Right;  . CERVICAL SPINE SURGERY    . COLONOSCOPY N/A 04/05/2017   Procedure: COLONOSCOPY;  Surgeon: Rogene [place], MD;  [facility];  Service: Endoscopy;  Laterality: N/A;  830  . CORONARY ARTERY BYPASS GRAFT N/A 05/04/2015   Procedure: CORONARY ARTERY BYPASS GRAFTING (CABG)  x four,  using left internal mammary artery, and bilateral thigh greater saphenous veins;  Surgeon: [name], MD;  Location: [place];  Service: Open Heart Surgery;  Laterality: N/A;  . EYE SURGERY Bilateral    cataract  . FOOT SURGERY Left    "little toe"  . I&D EXTREMITY Left 05/30/2013   Procedure: IRRIGATION AND DEBRIDEMENT Left Hand and Foreign body removal;  Surgeon: [name], MD;  Location: [place];  Service: Orthopedics;  Laterality: Left;  . LEFT HEART CATHETERIZATION WITH CORONARY ANGIOGRAM N/A 03/18/2014   Procedure: LEFT HEART CATHETERIZATION WITH CORONARY ANGIOGRAM;  Surgeon: [name], MD;  Location: [facility] CATH LAB;  Service: Cardiovascular;  Laterality: N/A;  . LUMBAR LAMINECTOMY    . PARTIAL COLECTOMY  For diverticulitis  . PTCA     unsuccesful  . REPLACEMENT TOTAL KNEE     right   . SHOULDER SURGERY Bilateral    x 2 (on both shoulder)_  . TEE WITHOUT CARDIOVERSION N/A 05/04/2015   Procedure: TRANSESOPHAGEAL ECHOCARDIOGRAM (TEE);  Surgeon: [name], MD;  Location: [place];  Service: Open Heart Surgery;  Laterality: N/A;  . thumb surgery Left     There were no vitals filed for this visit.  Subjective Assessment - 04/11/18 1104    Subjective  The therapy has been making the abdominal pain tolerable. Abdominal pain decreased by 50% with therapy. I had a  massage yesterday and I had to lay on my stomach and it was hard.     Patient Stated Goals  reduce abdominal pain, work on regular bowel movement    Currently in Pain?  Yes    Pain Score  4     Pain Location  Abdomen    Pain Orientation  Lower;Mid;Upper    Pain Descriptors / Indicators  Tender;Sore    Pain Type  Chronic pain    Pain Onset  More than a month ago    Pain Frequency  Constant    Aggravating Factors   touching abdomen    Pain Relieving Factors  nothing    Multiple Pain Sites  No         [facility] PT Assessment - 04/11/18 0001      Observation/Other Assessments   Observations  circumference of at umbilicus prior to treatment is 119 cm and after is 105  cm   standing                  [facility] Adult PT Treatment/Exercise - 04/11/18 0001      Manual Therapy   Manual Therapy  Soft tissue mobilization;Myofascial release    Soft tissue mobilization  abdominal soft tissue work; diaphgram soft tissue work;     Myofascial Release  along the lower abdomen and umbilicus; myofascial release through 3 planes of fascia in the respiratory and urogenital diaphgram; release along the sides of the lateral abdomen; release around the gall bladder and stomach area               PT Short Term Goals - 04/11/18 1152      PT SHORT TERM GOAL #2   Title  understand how to perform daily tasks with decreased strain on the abdomen    Time  4    Period  Weeks    Status  Achieved        PT Long Term Goals - 04/11/18 1152      PT LONG TERM GOAL #1   Title  Ind with advanced HEP.    Time  8    Period  Weeks    Status  On-going      PT LONG TERM GOAL #2   Title  abdominal strength >/= 3/5 due to reduction of diastasis </= 1 finger width    Time  8    Period  Weeks    Status  On-going      PT LONG TERM GOAL #3   Title  abdominal pain decreased >/= 75% due to improved scar mobility and abdominal contraction    Time  8    Period  Weeks    Status  Achieved      PT  LONG TERM GOAL #4   Title  ability to wait 5 - 10  minutes after he has the urge to have a bowel movment due to improved abdominal pain    Time  8    Period  Weeks    Status  On-going            Plan - 04/11/18 1149    Clinical Impression Statement  Abdomen circumference decreased from 119 cm to 105 cm after therapy.  Patient continues to have pinpoint tenderness located on the lower right lateral abdominal area. Patient abdominal pain decreased by 50% since initial evaluation.  Patient will be seeing the surgeon on 04/16/2018. Patient will benefit from skilled therapy to reduce abdominal pain and improve excursion of the diaphgram and strength of the core.     Rehab Potential  Excellent    Clinical Impairments Affecting Rehab Potential  Agent Orange exposure; COPD; Diabetic Neuropathy; PTSD; Cervical and lumbar surgery; bilateral shoulder surgery; Right TKR; open heart surgery    PT Frequency  1x / week    PT Duration  8 weeks    PT Treatment/Interventions  Cryotherapy;Electrical Stimulation;Moist Heat;Therapeutic activities;Therapeutic exercise;Patient/family education;Neuromuscular re-education;Manual techniques;Scar mobilization;Dry needling;Taping    PT Next Visit Plan  soft tissue work to abdomen, diaphragm, lower rib cage mobilization; abdominal strength in quadruped and sitting  body mechanics to not put strain on the abdomen with getting in and out of the car    PT Home Exercise Plan  Access Code: [place] and Agree with Plan of Care  Patient       Patient will benefit from skilled therapeutic intervention in order to improve the following deficits and impairments:  Pain, Increased fascial restricitons, Decreased mobility, Decreased scar mobility, Increased muscle spasms, Decreased activity tolerance, Decreased strength  Visit Diagnosis: Muscle weakness (generalized)  Other muscle spasm  Generalized abdominal pain     Problem List Patient Active Problem  List   Diagnosis Date Noted  . Hx of agent Orange exposure 03/29/2018  . Diabetic neuropathy ([place]) 03/07/2018  . Incontinence of feces   . Diarrhea 03/08/2017  . Expressive aphasia 07/15/2016  . Chest pain 09/26/2015  . Elevated TSH 05/08/2015  . S/P CABG x 4   . Bilateral carotid artery disease ([place]) 04/28/2015  . BMI 28.0-28.9,adult   . Unstable angina ([place]) 03/18/2014  . Type 2 diabetes mellitus without complication, without long-term current use of insulin ([place]) 09/21/2013  . COPD (chronic obstructive pulmonary disease) ([place]) 09/21/2013  . PTSD (post-traumatic stress disorder) 09/21/2013  . CAD (coronary artery disease) 04/16/2013  . HTN (hypertension) 04/16/2013  . Hyperlipidemia 04/16/2013    Earlie Counts, PT 04/11/18 11:53 AM   [facility] [address], [place], [name], [address] Phone: [phone]   Fax:  [phone]  Name: CABE LASHLEY MRN: [id number] Date of Birth: 11-15-1945

## 2018-04-17 ENCOUNTER — Encounter: Payer: Self-pay | Admitting: General Surgery

## 2018-04-17 ENCOUNTER — Ambulatory Visit (INDEPENDENT_AMBULATORY_CARE_PROVIDER_SITE_OTHER): Payer: Medicare Other | Admitting: General Surgery

## 2018-04-17 VITALS — BP 151/79 | HR 73 | Temp 98.9°F | Resp 20 | Wt 184.0 lb

## 2018-04-17 DIAGNOSIS — K8020 Calculus of gallbladder without cholecystitis without obstruction: Secondary | ICD-10-CM | POA: Diagnosis not present

## 2018-04-17 NOTE — Patient Instructions (Signed)
Cholelithiasis °Cholelithiasis is a form of gallbladder disease in which gallstones form in the gallbladder. The gallbladder is an organ that stores bile. Bile is made in the liver, and it helps to digest fats. Gallstones begin as small crystals and slowly grow into stones. They may cause no symptoms until the gallbladder tightens (contracts) and a gallstone is blocking the duct (gallbladder attack), which can cause pain. Cholelithiasis is also referred to as gallstones. °There are two main types of gallstones: °· Cholesterol stones. These are made of hardened cholesterol and are usually yellow-green in color. They are the most common type of gallstone. Cholesterol is a white, waxy, fat-like substance that is made in the liver. °· Pigment stones. These are dark in color and are made of a red-yellow substance that forms when hemoglobin from red blood cells breaks down (bilirubin). ° °What are the causes? °This condition may be caused by an imbalance in the substances that bile is made of. This can happen if the bile: °· Has too much bilirubin. °· Has too much cholesterol. °· Does not have enough bile salts. These salts help the body absorb and digest fats. ° °In some cases, this condition can also be caused by the gallbladder not emptying completely or often enough. °What increases the risk? °The following factors may make you more likely to develop this condition: °· Being male. °· Having multiple pregnancies. Health care providers sometimes advise removing diseased gallbladders before future pregnancies. °· Eating a diet that is heavy in fried foods, fat, and refined carbohydrates, like white bread and white rice. °· Being obese. °· Being older than age 40. °· Prolonged use of medicines that contain male hormones (estrogen). °· Having diabetes mellitus. °· Rapidly losing weight. °· Having a family history of gallstones. °· Being of American Indian or Mexican descent. °· Having an intestinal disease such as  Crohn disease. °· Having metabolic syndrome. °· Having cirrhosis. °· Having severe types of anemia such as sickle cell anemia. ° °What are the signs or symptoms? °In most cases, there are no symptoms. These are known as silent gallstones. If a gallstone blocks the bile ducts, it can cause a gallbladder attack. The main symptom of a gallbladder attack is sudden pain in the upper right abdomen. The pain usually comes at night or after eating a large meal. The pain can last for one or several hours and can spread to the right shoulder or chest. °If the bile duct is blocked for more than a few hours, it can cause infection or inflammation of the gallbladder, liver, or pancreas, which may cause: °· Nausea. °· Vomiting. °· Abdominal pain that lasts for 5 hours or more. °· Fever or chills. °· Yellowing of the skin or the whites of the eyes (jaundice). °· Dark urine. °· Light-colored stools. ° °How is this diagnosed? °This condition may be diagnosed based on: °· A physical exam. °· Your medical history. °· An ultrasound of your gallbladder. °· CT scan. °· MRI. °· Blood tests to check for signs of infection or inflammation. °· A scan of your gallbladder and bile ducts (biliary system) using nonharmful radioactive material and special cameras that can see the radioactive material (cholescintigram). This test checks to see how your gallbladder contracts and whether bile ducts are blocked. °· Inserting a small tube with a camera on the end (endoscope) through your mouth to inspect bile ducts and check for blockages (endoscopic retrograde cholangiopancreatogram). ° °How is this treated? °Treatment for gallstones depends on the   severity of the condition. Silent gallstones do not need treatment. If the gallstones cause a gallbladder attack or other symptoms, treatment may be required. Options for treatment include: °· Surgery to remove the gallbladder (cholecystectomy). This is the most common treatment. °· Medicines to dissolve  gallstones. These are most effective at treating small gallstones. You may need to take medicines for up to 6-12 months. °· Shock wave treatment (extracorporeal biliary lithotripsy). In this treatment, an ultrasound machine sends shock waves to the gallbladder to break gallstones into smaller pieces. These pieces can then be passed into the intestines or be dissolved by medicine. This is rarely used. °· Removing gallstones through endoscopic retrograde cholangiopancreatogram. A small basket can be attached to the endoscope and used to capture and remove gallstones. ° °Follow these instructions at home: °· Take over-the-counter and prescription medicines only as told by your health care provider. °· Maintain a healthy weight and follow a healthy diet. This includes: °? Reducing fatty foods, such as fried food. °? Reducing refined carbohydrates, like white bread and white rice. °? Increasing fiber. Aim for foods like almonds, fruit, and beans. °· Keep all follow-up visits as told by your health care provider. This is important. °Contact a health care provider if: °· You think you have had a gallbladder attack. °· You have been diagnosed with silent gallstones and you develop abdominal pain or indigestion. °Get help right away if: °· You have pain from a gallbladder attack that lasts for more than 2 hours. °· You have abdominal pain that lasts for more than 5 hours. °· You have a fever or chills. °· You have persistent nausea and vomiting. °· You develop jaundice. °· You have dark urine or light-colored stools. °Summary °· Cholelithiasis (also called gallstones) is a form of gallbladder disease in which gallstones form in the gallbladder. °· This condition is caused by an imbalance in the substances that make up bile. This can happen if the bile has too much cholesterol, too much bilirubin, or not enough bile salts. °· You are more likely to develop this condition if you are male, pregnant, using medicines with  estrogen, obese, older than age 40, or have a family history of gallstones. You may also develop gallstones if you have diabetes, an intestinal disease, cirrhosis, or metabolic syndrome. °· Treatment for gallstones depends on the severity of the condition. Silent gallstones do not need treatment. °· If gallstones cause a gallbladder attack or other symptoms, treatment may be needed. The most common treatment is surgery to remove the gallbladder. °This information is not intended to replace advice given to you by your health care provider. Make sure you discuss any questions you have with your health care provider. °Document Released: 05/12/2005 Document Revised: 01/31/2016 Document Reviewed: 01/31/2016 °[employer]vier Interactive Patient Education © 2018 [employer] ° °

## 2018-04-18 ENCOUNTER — Encounter: Payer: Self-pay | Admitting: Physical Therapy

## 2018-04-18 ENCOUNTER — Ambulatory Visit: Payer: Medicare Other | Admitting: Physical Therapy

## 2018-04-18 DIAGNOSIS — R1084 Generalized abdominal pain: Secondary | ICD-10-CM | POA: Diagnosis not present

## 2018-04-18 DIAGNOSIS — M6281 Muscle weakness (generalized): Secondary | ICD-10-CM | POA: Diagnosis not present

## 2018-04-18 DIAGNOSIS — M62838 Other muscle spasm: Secondary | ICD-10-CM | POA: Diagnosis not present

## 2018-04-18 NOTE — Progress Notes (Signed)
Eduardo [name] [place]; [id number]; Jan 16, 1946   HPI Patient is a 72 year old white male who was referred to my care by Assunta Found for evaluation treatment of cholelithiasis.  Patient has had extensive work-up this year over nonspecific abdominal pain.  He occasionally feels bloated and belches a significant amount.  It varies whether he has any nausea with fatty meals.  He has spontaneous episodes of nonspecific abdominal pain and occasionally right lower quadrant abdominal pain.  He has been seen by GI in the past.  Work-up only reveals cholelithiasis.  He denies any specific right upper quadrant abdominal pain with radiation to the right flank.  He denies any fever, chills, jaundice.  He has had diarrhea after undergoing a colonoscopy.  Despite his extensive work-up, the only positive finding has been cholelithiasis.  He states his pain level is currently 3 out of 10. Past Medical History:  Diagnosis Date  . Arthritis   . Bilateral carotid artery disease ([place]) 04/28/2015   1-39 percent bilateral stenosis noted on Doppler   . CAD (coronary artery disease)   . Cervical disc disease   . Chemical exposure    agent orange   . Colon polyps   . COPD (chronic obstructive pulmonary disease) ([place])   . Diabetes mellitus without complication ([place])   . Diabetic neuropathy ([place]) 03/07/2018  . Diverticulitis   . Gallstones   . GERD (gastroesophageal reflux disease)   . Hyperlipidemia   . Hypertension   . Lumbar disc disease   . Multiple gastric ulcers   . Obesity (BMI 30-39.9)   . Peripheral neuropathy    in all extremities  . PTSD (post-traumatic stress disorder)   . Sleep apnea    can't wear cpap  . Thoracic ascending aortic aneurysm [facility])     Past Surgical History:  Procedure Laterality Date  . ANAL RECTAL MANOMETRY N/A 02/05/2018   Procedure: ANO RECTAL MANOMETRY;  Surgeon: [name], MD;  [facility];  Service: Endoscopy;  Laterality: N/A;  . CARDIAC CATHETERIZATION     . CARDIAC CATHETERIZATION  03/18/14   difficult to determine culprit vesel  . CARDIAC CATHETERIZATION N/A 04/29/2015   Procedure: Left Heart Cath and Coronary Angiography;  Surgeon: [name], MD;  Location: [place] CV LAB;  Service: Cardiovascular;  Laterality: N/A;  . CARPOMETACARPAL ([place]) FUSION OF THUMB Right 08/04/2016   Procedure: CARPOMETACARPAL [facility]) FUSION OF THUMB;  Surgeon: Iran Planas, MD;  Location: [place];  Service: Orthopedics;  Laterality: Right;  . CERVICAL SPINE SURGERY    . COLONOSCOPY N/A 04/05/2017   Procedure: COLONOSCOPY;  Surgeon: Rogene Houston, MD;  [facility];  Service: Endoscopy;  Laterality: N/A;  830  . CORONARY ARTERY BYPASS GRAFT N/A 05/04/2015   Procedure: CORONARY ARTERY BYPASS GRAFTING (CABG)  x four,  using left internal mammary artery, and bilateral thigh greater saphenous veins;  Surgeon: [name], MD;  Location: [place];  Service: Open Heart Surgery;  Laterality: N/A;  . EYE SURGERY Bilateral    cataract  . FOOT SURGERY Left    "little toe"  . I&D EXTREMITY Left 05/30/2013   Procedure: IRRIGATION AND DEBRIDEMENT Left Hand and Foreign body removal;  Surgeon: [name], MD;  Location: [place];  Service: Orthopedics;  Laterality: Left;  . LEFT HEART CATHETERIZATION WITH CORONARY ANGIOGRAM N/A 03/18/2014   Procedure: LEFT HEART CATHETERIZATION WITH CORONARY ANGIOGRAM;  Surgeon: [name], MD;  Location: [facility] CATH LAB;  Service: Cardiovascular;  Laterality: N/A;  .  LUMBAR LAMINECTOMY    . PARTIAL COLECTOMY     For diverticulitis  . PTCA     unsuccesful  . REPLACEMENT TOTAL KNEE     right   . SHOULDER SURGERY Bilateral    x 2 (on both shoulder)_  . TEE WITHOUT CARDIOVERSION N/A 05/04/2015   Procedure: TRANSESOPHAGEAL ECHOCARDIOGRAM (TEE);  Surgeon: [name], MD;  Location: [place];  Service: Open Heart Surgery;  Laterality: N/A;  . thumb surgery Left     Family History  Problem Relation Age of Onset  .  Diabetes Father   . Heart disease Father   . Hypertension Father   . Diabetes Paternal Grandmother   . Heart disease Paternal Grandmother   . Hypertension Paternal Grandmother   . Diabetes Paternal Uncle   . Heart disease Paternal Uncle   . Heart disease Paternal Uncle   . Hypertension Paternal Uncle   . Heart disease Paternal Uncle   . Hypertension Mother   . Heart disease Mother   . Drug abuse Son   . Colon cancer Neg Hx   . Esophageal cancer Neg Hx   . Pancreatic cancer Neg Hx   . Stomach cancer Neg Hx   . Liver disease Neg Hx     Current Outpatient Medications on File Prior to Visit  Medication Sig Dispense Refill  . albuterol (PROVENTIL HFA;VENTOLIN HFA) 108 (90 BASE) MCG/ACT inhaler Inhale 1 puff into the lungs every 6 (six) hours as needed for wheezing or shortness of breath.     [name] aspirin EC 81 MG EC tablet Take 1 tablet (81 mg total) by mouth daily.    . budesonide-formoterol (SYMBICORT) 80-4.5 MCG/ACT inhaler Inhale 2 puffs into the lungs 2 (two) times daily. 1 Inhaler 3  . carvedilol (COREG) 12.5 MG tablet Take 12.5 mg by mouth 2 (two) times daily.     . cetirizine (ZYRTEC) 10 MG tablet Take 1 tablet (10 mg total) by mouth daily. 30 tablet 11  . Cholecalciferol (VITAMIN D3) 5000 units CAPS Take 5,000 Units by mouth daily.    . clopidogrel (PLAVIX) 75 MG tablet Take 75 mg by mouth daily.    . DULoxetine (CYMBALTA) 60 MG capsule Take 1 capsule (60 mg total) by mouth daily. 90 capsule 3  . ezetimibe (ZETIA) 10 MG tablet Take 10 mg by mouth at bedtime.     . fluticasone (FLONASE) 50 MCG/ACT nasal spray Place 2 sprays into both nostrils daily. 16 g 6  . gemfibrozil (LOPID) 600 MG tablet Take 600 mg by mouth 2 (two) times daily before a meal.    . metFORMIN (GLUCOPHAGE) 1000 MG tablet Take 1,000 mg by mouth 2 (two) times daily with a meal.    . nitroGLYCERIN (NITROSTAT) 0.4 MG SL tablet Place 0.4 mg under the tongue every 5 (five) minutes as needed for chest pain.    .  pantoprazole (PROTONIX) 40 MG tablet Take 1 tablet (40 mg total) by mouth 2 (two) times daily before a meal. Before breakfast and supper 180 tablet 0  . tamsulosin (FLOMAX) 0.4 MG CAPS capsule Take 1 capsule (0.4 mg total) by mouth daily. 90 capsule 3  . tetrahydrozoline 0.05 % ophthalmic solution Place 1-2 drops into both eyes 3 (three) times daily as needed (for dry/irritated eyes).    [name] tiotropium (SPIRIVA) 18 MCG inhalation capsule Place 18 mcg into inhaler and inhale daily as needed (for breathing).    . [DISCONTINUED] omeprazole (PRILOSEC) 20 MG capsule Take 20 mg by  mouth daily.     No current facility-administered medications on file prior to visit.     Allergies  Allergen Reactions  . Statins Other (See Comments)    Body aches  . Benazepril     hyperkalemia  . Ciprofloxacin     Per cardiac surgery    Social History   Substance and Sexual Activity  Alcohol Use No  . Alcohol/week: 0.0 standard drinks    Social History   Tobacco Use  Smoking Status Former Smoker  . Packs/day: 0.75  . Years: 30.00  . Pack years: 22.50  . Types: Cigarettes  . Start date: 40  . Last attempt to quit: 05/31/1987  . Years since quitting: 30.9  Smokeless Tobacco Never Used    Review of Systems  Constitutional: Negative.   HENT: Negative.   Eyes: Negative.   Respiratory: Negative.   Cardiovascular: Negative.   Gastrointestinal: Positive for abdominal pain.  Genitourinary: Positive for frequency.  Musculoskeletal: Positive for joint pain.  Skin: Negative.   Neurological: Negative.   Endo/Heme/Allergies: Negative.   Psychiatric/Behavioral: Negative.     Objective   Vitals:   04/17/18 1348  BP: (!) 151/79  Pulse: 73  Resp: 20  Temp: 98.9 F (37.2 C)    Physical Exam  Constitutional: He is oriented to person, place, and time. He appears well-developed and well-nourished. He does not appear ill.  HENT:  Head: Normocephalic and atraumatic.  Cardiovascular: Normal rate,  regular rhythm and normal heart sounds. Exam reveals no gallop.  No murmur heard. Pulmonary/Chest: Effort normal and breath sounds normal. No stridor. No respiratory distress. He has no wheezes. He has no rhonchi. He has no rales.  Abdominal: Soft. Normal appearance and bowel sounds are normal. There is no hepatosplenomegaly. There is no rigidity, no guarding, no CVA tenderness, no tenderness at McBurney's point and negative Murphy's sign. No hernia.  He states that he has abdominal wall pain after palpating him in various regions.  Neurological: He is alert and oriented to person, place, and time.  Skin: Skin is warm and dry.  Vitals reviewed. Previous test results reviewed  Assessment  Cholelithiasis, atypical symptoms for biliary colic Plan   I had an extensive discussion with the patient.  He is extremely anxious about his cholelithiasis as his wife was very sick many years ago from cholecystitis.  I told him that a cholecystectomy may not relieve him of any of the symptoms he is concerned about.  He understands this.  He will go back to his primary care physician to discuss this.  Should he decide to proceed with a laparoscopic cholecystectomy, he is aware of the risks and benefits including bleeding, infection, bowel injury, and the possibility of an open procedure.  Further management is pending his decision.

## 2018-04-18 NOTE — Patient Instructions (Signed)

## 2018-04-18 NOTE — Therapy (Signed)
[facility] [address] [place], [place], [address] Phone: [phone]   Fax:  [phone]  Physical Therapy Treatment  Patient Details  Name: Eduardo [name] MRN: [id number] Date of Birth: 1945-10-27 Referring Provider (PT): Dr. Silvano Rusk   Encounter Date: 04/18/2018  PT End of Session - 04/18/18 1152    Visit Number  5    Date for PT Re-Evaluation  05/14/18    Authorization Type  Medicare/Tricare    PT Start Time  1145    PT Stop Time  1225    PT Time Calculation (min)  40 min    Activity Tolerance  Patient tolerated treatment well;No increased pain    Behavior During Therapy  WFL for tasks assessed/performed       Past Medical History:  Diagnosis Date  . Arthritis   . Bilateral carotid artery disease ([place]) 04/28/2015   1-39 percent bilateral stenosis noted on Doppler   . CAD (coronary artery disease)   . Cervical disc disease   . Chemical exposure    agent orange   . Colon polyps   . COPD (chronic obstructive pulmonary disease) ([place])   . Diabetes mellitus without complication ([place])   . Diabetic neuropathy ([place]) 03/07/2018  . Diverticulitis   . Gallstones   . GERD (gastroesophageal reflux disease)   . Hyperlipidemia   . Hypertension   . Lumbar disc disease   . Multiple gastric ulcers   . Obesity (BMI 30-39.9)   . Peripheral neuropathy    in all extremities  . PTSD (post-traumatic stress disorder)   . Sleep apnea    can't wear cpap  . Thoracic ascending aortic aneurysm [facility])     Past Surgical History:  Procedure Laterality Date  . ANAL RECTAL MANOMETRY N/A 02/05/2018   Procedure: ANO RECTAL MANOMETRY;  Surgeon: [name], MD;  [facility];  Service: Endoscopy;  Laterality: N/A;  . CARDIAC CATHETERIZATION    . CARDIAC CATHETERIZATION  03/18/14   difficult to determine culprit vesel  . CARDIAC CATHETERIZATION N/A 04/29/2015   Procedure: Left Heart Cath and Coronary  Angiography;  Surgeon: [name], MD;  Location: [place] CV LAB;  Service: Cardiovascular;  Laterality: N/A;  . CARPOMETACARPAL ([place]) FUSION OF THUMB Right 08/04/2016   Procedure: CARPOMETACARPAL [facility]) FUSION OF THUMB;  Surgeon: Iran Planas, MD;  Location: [place];  Service: Orthopedics;  Laterality: Right;  . CERVICAL SPINE SURGERY    . COLONOSCOPY N/A 04/05/2017   Procedure: COLONOSCOPY;  Surgeon: Rogene Houston, MD;  [facility];  Service: Endoscopy;  Laterality: N/A;  830  . CORONARY ARTERY BYPASS GRAFT N/A 05/04/2015   Procedure: CORONARY ARTERY BYPASS GRAFTING (CABG)  x four,  using left internal mammary artery, and bilateral thigh greater saphenous veins;  Surgeon: [name], MD;  Location: [place];  Service: Open Heart Surgery;  Laterality: N/A;  . EYE SURGERY Bilateral    cataract  . FOOT SURGERY Left    "little toe"  . I&D EXTREMITY Left 05/30/2013   Procedure: IRRIGATION AND DEBRIDEMENT Left Hand and Foreign body removal;  Surgeon: [name], MD;  Location: [place];  Service: Orthopedics;  Laterality: Left;  . LEFT HEART CATHETERIZATION WITH CORONARY ANGIOGRAM N/A 03/18/2014   Procedure: LEFT HEART CATHETERIZATION WITH CORONARY ANGIOGRAM;  Surgeon: [name], MD;  Location: [facility] CATH LAB;  Service: Cardiovascular;  Laterality: N/A;  . LUMBAR LAMINECTOMY    . PARTIAL COLECTOMY  For diverticulitis  . PTCA     unsuccesful  . REPLACEMENT TOTAL KNEE     right   . SHOULDER SURGERY Bilateral    x 2 (on both shoulder)_  . TEE WITHOUT CARDIOVERSION N/A 05/04/2015   Procedure: TRANSESOPHAGEAL ECHOCARDIOGRAM (TEE);  Surgeon: [name], MD;  Location: [place];  Service: Open Heart Surgery;  Laterality: N/A;  . thumb surgery Left     There were no vitals filed for this visit.  Subjective Assessment - 04/18/18 1148    Subjective  I saw the doctor. The doctor found the pinpoint pain in the abdomen and was not sure about what it was.     Patient  Stated Goals  reduce abdominal pain, work on regular bowel movement    Currently in Pain?  Yes    Pain Score  3     Pain Location  Abdomen    Pain Orientation  Mid;Upper    Pain Descriptors / Indicators  Sore;Tender    Pain Onset  More than a month ago    Pain Frequency  Constant    Aggravating Factors   touching the abdomen    Pain Relieving Factors  nothing    Multiple Pain Sites  No         [facility] PT Assessment - 04/18/18 0001      Assessment   Medical Diagnosis  R15.9 Full incontinence of feces; K62.89 Anal sphincter incompetence    Referring Provider (PT)  Dr. Silvano Rusk    Onset Date/Surgical Date  05/30/17    Prior Therapy  none      Precautions   Precautions  None      Restrictions   Weight Bearing Restrictions  No      [place] residence      Cognition   Overall Cognitive Status  Within Functional Limits for tasks assessed      Observation/Other Assessments   Observations  circumference of at umbilicus prior to treatment is 106 cm and after is 105  cm   standing     AROM   Overall AROM Comments  full lumbar ROM      Palpation   Palpation comment  after treatment today the no trigger points in the abdomen                   [facility] Adult PT Treatment/Exercise - 04/18/18 0001      Manual Therapy   Manual Therapy  Soft tissue mobilization;Myofascial release    Soft tissue mobilization  abdominal soft tissue work; diaphgram soft tissue work;     Myofascial Release  along the lower abdomen and umbilicus; myofascial release through 3 planes of fascia in the respiratory and urogenital diaphgram; release along the sides of the lateral abdomen; release around the gall bladder and stomach area       Trigger Point Dry Needling - 04/18/18 1224    Consent Given?  Yes    Education Handout Provided  Yes    Muscles Treated Upper Body  Quadratus Lumborum;Iliopsoas   right, right transverse abdominus   Muscles Treated  Lower Body  --   elongation of tissue, trigger point of tissue          PT Education - 04/18/18 1227    Education Details  information on dry needling    Person(s) Educated  Patient    Methods  Explanation;Handout    Comprehension  Verbalized understanding  PT Short Term Goals - 04/11/18 1152      PT SHORT TERM GOAL #2   Title  understand how to perform daily tasks with decreased strain on the abdomen    Time  4    Period  Weeks    Status  Achieved        PT Long Term Goals - 04/18/18 1226      PT LONG TERM GOAL #1   Title  Ind with advanced HEP.    Time  8    Period  Weeks    Status  Achieved      PT LONG TERM GOAL #2   Title  abdominal strength >/= 3/5 due to reduction of diastasis </= 1 finger width    Time  8    Period  Weeks    Status  Achieved      PT LONG TERM GOAL #3   Title  abdominal pain decreased >/= 75% due to improved scar mobility and abdominal contraction    Time  8    Period  Weeks    Status  Achieved      PT LONG TERM GOAL #4   Title  ability to wait 5 - 10 minutes after he has the urge to have a bowel movment due to improved abdominal pain    Time  8    Period  Weeks    Status  Achieved            Plan - 04/18/18 1152    Clinical Impression Statement  After treatment today patient had no trigger points in the abdomen. Patients circumference of the abdomen is 106cm which is the smallest it has been. Patient understands how to perform daily tasks with abdominal bracing. Patient is ready for discharge.     Rehab Potential  Excellent    Clinical Impairments Affecting Rehab Potential  Agent Orange exposure; COPD; Diabetic Neuropathy; PTSD; Cervical and lumbar surgery; bilateral shoulder surgery; Right TKR; open heart surgery    PT Treatment/Interventions  Cryotherapy;Electrical Stimulation;Moist Heat;Therapeutic activities;Therapeutic exercise;Patient/family education;Neuromuscular re-education;Manual techniques;Scar mobilization;Dry  needling;Taping    PT Next Visit Plan  Discharge to HEP     PT Home Exercise Plan  Access Code: [facility]     Consulted and Agree with Plan of Care  Patient       Patient will benefit from skilled therapeutic intervention in order to improve the following deficits and impairments:  Pain, Increased fascial restricitons, Decreased mobility, Decreased scar mobility, Increased muscle spasms, Decreased activity tolerance, Decreased strength  Visit Diagnosis: Muscle weakness (generalized)  Other muscle spasm  Generalized abdominal pain     Problem List Patient Active Problem List   Diagnosis Date Noted  . Hx of agent Orange exposure 03/29/2018  . Diabetic neuropathy ([place]) 03/07/2018  . Incontinence of feces   . Diarrhea 03/08/2017  . Expressive aphasia 07/15/2016  . Chest pain 09/26/2015  . Elevated TSH 05/08/2015  . S/P CABG x 4   . Bilateral carotid artery disease ([place]) 04/28/2015  . BMI 28.0-28.9,adult   . Unstable angina ([place]) 03/18/2014  . Type 2 diabetes mellitus without complication, without long-term current use of insulin ([place]) 09/21/2013  . COPD (chronic obstructive pulmonary disease) ([place]) 09/21/2013  . PTSD (post-traumatic stress disorder) 09/21/2013  . CAD (coronary artery disease) 04/16/2013  . HTN (hypertension) 04/16/2013  . Hyperlipidemia 04/16/2013    , 04/18/2018, 12:29 PM  [facility] [address], [place], [place],  [address] Phone: [phone]   Fax:  [phone]  Name: ETHON WYMER MRN: [id number] Date of Birth: November 18, 1945 PHYSICAL THERAPY DISCHARGE SUMMARY  Visits from Start of Care: 5  Current functional level related to goals / functional outcomes: See above.    Remaining deficits: See above.   Education / Equipment: HEP Plan: Patient agrees to discharge.  Patient goals were met. Patient is being discharged due to meeting the stated rehab goals.  Thank you  for the referral. Earlie Counts, PT 04/18/18 12:28 PM  ?????

## 2018-04-25 ENCOUNTER — Encounter: Payer: Self-pay | Admitting: Student

## 2018-04-25 ENCOUNTER — Ambulatory Visit (INDEPENDENT_AMBULATORY_CARE_PROVIDER_SITE_OTHER): Payer: Medicare Other | Admitting: Student

## 2018-04-25 VITALS — BP 118/72 | HR 76 | Ht 68.0 in | Wt 182.2 lb

## 2018-04-25 DIAGNOSIS — E785 Hyperlipidemia, unspecified: Secondary | ICD-10-CM | POA: Diagnosis not present

## 2018-04-25 DIAGNOSIS — I7121 Aneurysm of the ascending aorta, without rupture: Secondary | ICD-10-CM

## 2018-04-25 DIAGNOSIS — I712 Thoracic aortic aneurysm, without rupture: Secondary | ICD-10-CM | POA: Diagnosis not present

## 2018-04-25 DIAGNOSIS — R1084 Generalized abdominal pain: Secondary | ICD-10-CM

## 2018-04-25 DIAGNOSIS — I25812 Atherosclerosis of bypass graft of coronary artery of transplanted heart without angina pectoris: Secondary | ICD-10-CM

## 2018-04-25 DIAGNOSIS — I10 Essential (primary) hypertension: Secondary | ICD-10-CM | POA: Diagnosis not present

## 2018-04-25 NOTE — Patient Instructions (Signed)
Medication Instructions:  Your physician recommends that you continue on your current medications as directed. Please refer to the Current Medication list given to you today.  If you need a refill on your cardiac medications before your next appointment, please call your pharmacy.   Lab work: NONE  If you have labs (blood work) drawn today and your tests are completely normal, you will receive your results only by: . MyChart Message (if you have MyChart) OR . A paper copy in the mail If you have any lab test that is abnormal or we need to change your treatment, we will call you to review the results.  Testing/Procedures: NONE   Follow-Up: At [facility], you and your health needs are our priority.  As part of our continuing mission to provide you with exceptional heart care, we have created designated Provider Care Teams.  These Care Teams include your primary Cardiologist (physician) and Advanced Practice Providers (APPs -  Physician Assistants and Nurse Practitioners) who all work together to provide you with the care you need, when you need it. You will need a follow up appointment in 1 years.  Please call our office 2 months in advance to schedule this appointment.  You may see Suresh Koneswaran, MD or one of the following Advanced Practice Providers on your designated Care Team:   Brittany Strader, PA-C ([place] Office) . Michele Lenze, PA-C ([place] Office)  Any Other Special Instructions Will Be Listed Below (If Applicable). Thank you for choosing [facility]!     

## 2018-04-25 NOTE — Progress Notes (Signed)
Cardiology Office Note    Date:  04/25/2018   ID:  Eduardo [name], DOB [birth year], MRN [id number]  PCP:  [name], MD  Cardiologist: [name], MD    Chief Complaint  Patient presents with  . Follow-up    Annual Visit    History of Present Illness:    Eduardo [name] is a 72 y.o. male with past medical history of CAD (s/p CABG in 2016 with LIMA-LAD, reverse SVG-D2, reverse SVG-OM1, and reverse SVG-PDA), HTN, HLD, Type 2 DM and thoracic aortic aneurysm who presents to the office today for one-year follow-up.  He was last examined by Dr. Bronson Ing in 12/2016 and had recently undergone shoulder surgery but was recovering well. He denied any recent chest pain or dyspnea at that time. Was continued on ASA and Coreg but was not restarted on ACE-I therapy due to prior issues with hyperkalemia.   In talking with the patient today, he reports overall doing well from a cardiac perspective since his last office visit. He walks for over a mile per day and has been using the Elliptical for 15 to 30-minute intervals without any recent anginal symptoms. No recent chest pain, dyspnea on exertion, orthopnea, PND, or lower extremity edema. He denies any recent lightheadedness, dizziness, or presyncope.  He does follow BP in the ambulatory setting and reports this has overall been well controlled. BP is at 118/72 during today's visit.  His main concern that he brings up multiple times that he has experienced chronic abdominal pain over the past year which does not seem to be related to food consumption. He reports having undergone multiple studies and has been evaluated by several Gastroenterologists and Surgeons but not given a definitive diagnosis which frustrates him. He feels like his symptoms are possibly due to known gallstones.   Past Medical History:  Diagnosis Date  . Arthritis   . Bilateral carotid artery disease ([place]) 04/28/2015   1-39 percent bilateral  stenosis noted on Doppler   . CAD (coronary artery disease)    a. s/p CABG in 2016 with LIMA-LAD, reverse SVG-D2, reverse SVG-OM1, and reverse SVG-PDA  . Cervical disc disease   . Chemical exposure    agent orange   . Colon polyps   . COPD (chronic obstructive pulmonary disease) ([place])   . Diabetes mellitus without complication ([facility])   . Diabetic neuropathy ([place]) 03/07/2018  . Diverticulitis   . Gallstones   . GERD (gastroesophageal reflux disease)   . Hyperlipidemia   . Hypertension   . Lumbar disc disease   . Multiple gastric ulcers   . Obesity (BMI 30-39.9)   . Peripheral neuropathy    in all extremities  . PTSD (post-traumatic stress disorder)   . Sleep apnea    can't wear cpap  . Thoracic ascending aortic aneurysm [facility])     Past Surgical History:  Procedure Laterality Date  . ANAL RECTAL MANOMETRY N/A 02/05/2018   Procedure: ANO RECTAL MANOMETRY;  Surgeon: [name], MD;  Location: [facility];  Service: Endoscopy;  Laterality: N/A;  . CARDIAC CATHETERIZATION    . CARDIAC CATHETERIZATION  03/18/14   difficult to determine culprit vesel  . CARDIAC CATHETERIZATION N/A 04/29/2015   Procedure: Left Heart Cath and Coronary Angiography;  Surgeon: [name], MD;  Location: [facility] CV LAB;  Service: Cardiovascular;  Laterality: N/A;  . CARPOMETACARPAL ([place]) FUSION OF THUMB Right 08/04/2016   Procedure: CARPOMETACARPAL [facility]) FUSION OF THUMB;  Surgeon: Iran Planas, MD;  Location: [facility];  Service: Orthopedics;  Laterality: Right;  . CERVICAL SPINE SURGERY    . COLONOSCOPY N/A 04/05/2017   Procedure: COLONOSCOPY;  Surgeon: Rogene Houston, MD;  [facility];  Service: Endoscopy;  Laterality: N/A;  830  . CORONARY ARTERY BYPASS GRAFT N/A 05/04/2015   Procedure: CORONARY ARTERY BYPASS GRAFTING (CABG)  x four,  using left internal mammary artery, and bilateral thigh greater saphenous veins;  Surgeon: [name], MD;  Location: [facility];  Service: Open  Heart Surgery;  Laterality: N/A;  . EYE SURGERY Bilateral    cataract  . FOOT SURGERY Left    "little toe"  . I&D EXTREMITY Left 05/30/2013   Procedure: IRRIGATION AND DEBRIDEMENT Left Hand and Foreign body removal;  Surgeon: [name], MD;  Location: [facility];  Service: Orthopedics;  Laterality: Left;  . LEFT HEART CATHETERIZATION WITH CORONARY ANGIOGRAM N/A 03/18/2014   Procedure: LEFT HEART CATHETERIZATION WITH CORONARY ANGIOGRAM;  Surgeon: [name], MD;  Location: [facility] CATH LAB;  Service: Cardiovascular;  Laterality: N/A;  . LUMBAR LAMINECTOMY    . PARTIAL COLECTOMY     For diverticulitis  . PTCA     unsuccesful  . REPLACEMENT TOTAL KNEE     right   . SHOULDER SURGERY Bilateral    x 2 (on both shoulder)_  . TEE WITHOUT CARDIOVERSION N/A 05/04/2015   Procedure: TRANSESOPHAGEAL ECHOCARDIOGRAM (TEE);  Surgeon: [name], MD;  Location: [facility];  Service: Open Heart Surgery;  Laterality: N/A;  . thumb surgery Left     Current Medications: Outpatient Medications Prior to Visit  Medication Sig Dispense Refill  . albuterol (PROVENTIL HFA;VENTOLIN HFA) 108 (90 BASE) MCG/ACT inhaler Inhale 1 puff into the lungs every 6 (six) hours as needed for wheezing or shortness of breath.     [name] aspirin EC 81 MG EC tablet Take 1 tablet (81 mg total) by mouth daily.    . budesonide-formoterol (SYMBICORT) 80-4.5 MCG/ACT inhaler Inhale 2 puffs into the lungs 2 (two) times daily. 1 Inhaler 3  . carvedilol (COREG) 12.5 MG tablet Take 12.5 mg by mouth 2 (two) times daily.     . cetirizine (ZYRTEC) 10 MG tablet Take 1 tablet (10 mg total) by mouth daily. 30 tablet 11  . Cholecalciferol (VITAMIN D3) 5000 units CAPS Take 5,000 Units by mouth daily.    . clopidogrel (PLAVIX) 75 MG tablet Take 75 mg by mouth daily.    . DULoxetine (CYMBALTA) 60 MG capsule Take 1 capsule (60 mg total) by mouth daily. 90 capsule 3  . ezetimibe (ZETIA) 10 MG tablet Take 10 mg by mouth at bedtime.     [name] gemfibrozil  (LOPID) 600 MG tablet Take 600 mg by mouth 2 (two) times daily before a meal.    . metFORMIN (GLUCOPHAGE) 1000 MG tablet Take 1,000 mg by mouth 2 (two) times daily with a meal.    . nitroGLYCERIN (NITROSTAT) 0.4 MG SL tablet Place 0.4 mg under the tongue every 5 (five) minutes as needed for chest pain.    . pantoprazole (PROTONIX) 40 MG tablet Take 1 tablet (40 mg total) by mouth 2 (two) times daily before a meal. Before breakfast and supper 180 tablet 0  . tamsulosin (FLOMAX) 0.4 MG CAPS capsule Take 1 capsule (0.4 mg total) by mouth daily. 90 capsule 3  . tetrahydrozoline 0.05 % ophthalmic solution Place 1-2 drops into both eyes 3 (three) times daily as needed (for dry/irritated eyes).    [name]  tiotropium (SPIRIVA) 18 MCG inhalation capsule Place 18 mcg into inhaler and inhale daily as needed (for breathing).    . fluticasone (FLONASE) 50 MCG/ACT nasal spray Place 2 sprays into both nostrils daily. 16 g 6   No facility-administered medications prior to visit.      Allergies:   Statins; Benazepril; and Ciprofloxacin   Social History   Socioeconomic History  . Marital status: Married    Spouse name: Flo  . Number of children: 6  . Years of education: Not on file  . Highest education level: 12th grade  Occupational History  . Occupation: retired  Scientific laboratory technician  . Financial resource strain: Not on file  . Food insecurity:    Worry: Not on file    Inability: Not on file  . Transportation needs:    Medical: Not on file    Non-medical: Not on file  Tobacco Use  . Smoking status: Former Smoker    Packs/day: 0.75    Years: 30.00    Pack years: 22.50    Types: Cigarettes    Start date: 1962    Last attempt to quit: 05/31/1987    Years since quitting: 30.9  . Smokeless tobacco: Never Used  Substance and Sexual Activity  . Alcohol use: No    Alcohol/week: 0.0 standard drinks  . Drug use: No  . Sexual activity: Yes  Lifestyle  . Physical activity:    Days per week: Not on file     Minutes per session: Not on file  . Stress: Not on file  Relationships  . Social connections:    Talks on phone: More than three times a week    Gets together: More than three times a week    Attends religious service: More than 4 times per year    Active member of club or organization: Yes    Attends meetings of clubs or organizations: More than 4 times per year    Relationship status: Married  Other Topics Concern  . Not on file  Social History Narrative   Patient is married.  5 sons one daughter.  They are scattered throughout the country.  17 grandchildren.   He is a [employer] of 23 years service and was in the [employer]   After the [employer] he ran a [employer].      For habits he quit smoking in 1989.  3 caffeinated beverages a day no alcohol or drug use.  No smokeless tobacco.   Left handed (states he can use both if needed)   Caffeine 3 cups per day       Family History:  The patient's family history includes Diabetes in his father, paternal grandmother, and paternal uncle; Drug abuse in his son; Heart disease in his father, mother, paternal grandmother, paternal uncle, paternal uncle, and paternal uncle; Hypertension in his father, mother, paternal grandmother, and paternal uncle.   Review of Systems:   Please see the history of present illness.     General:  No chills, fever, night sweats or weight changes.  Cardiovascular:  No chest pain, dyspnea on exertion, edema, orthopnea, palpitations, paroxysmal nocturnal dyspnea. Dermatological: No rash, lesions/masses Respiratory: No cough, dyspnea Urologic: No hematuria, dysuria Abdominal:   No nausea, vomiting, diarrhea, bright red blood per rectum, melena, or hematemesis. Positive for abdominal pain.  Neurologic:  No visual changes, wkns, changes in mental status. All other systems reviewed and are otherwise negative except as noted above.   Physical Exam:  VS:  BP 118/72   Pulse 76   Ht 5\' 8"  (1.727 m)   Wt  182 lb 3.2 oz (82.6 kg)   SpO2 94%   BMI 27.70 kg/m    General: Well developed, well nourished Caucasian male appearing in no acute distress. Head: Normocephalic, atraumatic, sclera non-icteric, no xanthomas, nares are without discharge.  Neck: No carotid bruits. JVD not elevated.  Lungs: Respirations regular and unlabored, without wheezes or rales.  Heart: Regular rate and rhythm. No S3 or S4.  No murmur, no rubs, or gallops appreciated. Abdomen: Soft, non-tender, non-distended with normoactive bowel sounds. No hepatomegaly. No rebound/guarding. No obvious abdominal masses. Msk:  Strength and tone appear normal for age. No joint deformities or effusions. Extremities: No clubbing or cyanosis. No lower extremity edema.  Distal pedal pulses are 2+ bilaterally. Neuro: Alert and oriented X 3. Moves all extremities spontaneously. No focal deficits noted. Psych:  Responds to questions appropriately with a normal affect. Skin: No rashes or lesions noted  Wt Readings from Last 3 Encounters:  04/25/18 182 lb 3.2 oz (82.6 kg)  04/17/18 184 lb (83.5 kg)  03/29/18 184 lb 9.6 oz (83.7 kg)     Studies/Labs Reviewed:   EKG:  EKG is not ordered today.   Recent Labs: 11/10/2017: ALT 18; BUN 19; Creatinine, Ser 0.75; Hemoglobin 13.1; Platelets 235; Potassium 5.0; Sodium 138 02/05/2018: TSH 1.930   Lipid Panel    Component Value Date/Time   CHOL 130 07/16/2016 0701   CHOL 132 01/27/2015 0908   TRIG 308 (H) 07/16/2016 0701   HDL 25 (L) 07/16/2016 0701   HDL 36 (L) 01/27/2015 0908   CHOLHDL 5.2 07/16/2016 0701   VLDL 62 (H) 07/16/2016 0701   LDLCALC 43 07/16/2016 0701   LDLCALC 57 01/27/2015 0908    Additional studies/ records that were reviewed today include:   Cardiac Catheterization: 03/2015  Prox RCA lesion, 50% stenosed, heavily calcified and eccentric.  Mid RCA lesion, 70% stenosed, heavily calcified and eccentric.  Mid Cx lesion at ostium of OM2, 75% stenosed- seen best in the  cranial view and noted on 2015 cath.  Mid LAD lesion, 80% stenosed- progressed since the 2015 cath.  Ost 2nd Diag to 2nd Diag lesion, 60% stenosed.  The left ventricular systolic function is normal.   Discussed with the inpatient team (Dr. Martinique).  Patient with thoracic aneurysm of unknown size.  Given how difficult RCA PCI would be due to calcification and tortuosity, will get cardiac surgery evaluation.    Stop clopidogrel at this time.  Resume heparin 8 hours post sheath pull.  Echocardiogram: 04/2015 Study Conclusions  - Left ventricle: The cavity size was normal. There was mild   concentric hypertrophy. Systolic function was normal. The   estimated ejection fraction was in the range of 55% to 60%.   Probable mild hypokinesis of the basal-midinferior and   inferoseptal myocardium. Left ventricular diastolic function   parameters were normal. - Aortic valve: There was mild regurgitation.  Assessment:    1. Coronary artery disease involving bypass graft of transplanted heart without angina pectoris   2. Essential hypertension   3. Hyperlipidemia, unspecified hyperlipidemia type   4. Thoracic ascending aortic aneurysm ([place])   5. Generalized abdominal pain      Plan:   In order of problems listed above:  1. CAD - he is s/p CABG in 2016 with LIMA-LAD, reverse SVG-D2, reverse SVG-OM1, and reverse SVG-PDA.  He exercises on a daily basis by walking over  a mile or using the elliptical and denies any recent anginal symptoms with these activities. - Continue ASA, Plavix (has been on DAPT since CABG), and BB therapy. Intolerant to statins and remains on Zetia.   2. HTN - BP is well controlled at 118/72 during today's visit. Continue Carvedilol 12.5 mg twice daily.  3. HLD - Followed by PCP. He has been intolerant to statins in the past and remains on Zetia 10 mg daily. Goal LDL is less than 70 in the setting of known CAD.  4. Thoracic Ascending Aortic Aneurysm - followed by  CT Surgery. CTA in 09/2017 showed a stable size of 4.2 cm ascending thoracic aortic aneurysm with aneurysmal dilatation of the aortic root measuring 5.2 cm, previously 5.1 cm. Annual imaging was recommended.   5. Chronic Abdominal Pain - He reports having diffuse abdominal pain over the past year and has been evaluated by multiple GI specialists but not given a definitive diagnosis. Denies any recent melena or hematochezia. No association with food consumption. He feels that his pain is due to known gallstones but is undecided about pursuing a cholecystectomy.  - from a cardiac perspective, he is very active at baseline and denies any recent anginal symptoms. I would not anticipate the need for further ischemic evaluation prior to his procedure. He has remained on DAPT ever since undergoing Plavix and permission to hold this would need to be obtained by his Primary Cardiologist. Given that he has not required any recent stent placement, hopefully this would not be an issue and could be held for 5 days if surgery is in fact pursued.     Medication Adjustments/Labs and Tests Ordered: Current medicines are reviewed at length with the patient today.  Concerns regarding medicines are outlined above.  Medication changes, Labs and Tests ordered today are listed in the Patient Instructions below. Patient Instructions  Medication Instructions:  Your physician recommends that you continue on your current medications as directed. Please refer to the Current Medication list given to you today.  If you need a refill on your cardiac medications before your next appointment, please call your pharmacy.   Lab work: NONE  If you have labs (blood work) drawn today and your tests are completely normal, you will receive your results only by: [name] MyChart Message (if you have MyChart) OR . A paper copy in the mail If you have any lab test that is abnormal or we need to change your treatment, we will call you to review  the results.  Testing/Procedures: NONE   Follow-Up: At [facility], you and your health needs are our priority.  As part of our continuing mission to provide you with exceptional heart care, we have created designated Provider Care Teams.  These Care Teams include your primary Cardiologist (physician) and Advanced Practice Providers (APPs -  Physician Assistants and Nurse Practitioners) who all work together to provide you with the care you need, when you need it. You will need a follow up appointment in 1 years.  Please call our office 2 months in advance to schedule this appointment.  You may see [name], MD or one of the following Advanced Practice Providers on your designated Care Team:   Bernerd Pho, PA-C [facility]) . Ermalinda Barrios, PA-C ([place])  Any Other Special Instructions Will Be Listed Below (If Applicable). Thank you for choosing [facility]!     Signed, Erma Heritage, PA-C  04/25/2018 5:26 PM    [facility] [address] [place], [place] [address] Phone: [phone]

## 2018-05-15 ENCOUNTER — Ambulatory Visit: Payer: Medicare Other | Admitting: Internal Medicine

## 2018-05-28 ENCOUNTER — Encounter: Payer: Self-pay | Admitting: Pediatrics

## 2018-05-28 ENCOUNTER — Ambulatory Visit (INDEPENDENT_AMBULATORY_CARE_PROVIDER_SITE_OTHER): Payer: Medicare Other | Admitting: Pediatrics

## 2018-05-28 VITALS — BP 136/81 | HR 64 | Temp 97.3°F | Ht 68.0 in | Wt 178.0 lb

## 2018-05-28 DIAGNOSIS — R3911 Hesitancy of micturition: Secondary | ICD-10-CM | POA: Diagnosis not present

## 2018-05-28 DIAGNOSIS — E119 Type 2 diabetes mellitus without complications: Secondary | ICD-10-CM | POA: Diagnosis not present

## 2018-05-28 DIAGNOSIS — I25812 Atherosclerosis of bypass graft of coronary artery of transplanted heart without angina pectoris: Secondary | ICD-10-CM

## 2018-05-28 DIAGNOSIS — R399 Unspecified symptoms and signs involving the genitourinary system: Secondary | ICD-10-CM

## 2018-05-28 DIAGNOSIS — J3089 Other allergic rhinitis: Secondary | ICD-10-CM

## 2018-05-28 DIAGNOSIS — N401 Enlarged prostate with lower urinary tract symptoms: Secondary | ICD-10-CM | POA: Diagnosis not present

## 2018-05-28 LAB — URINALYSIS, COMPLETE
BILIRUBIN UA: NEGATIVE
GLUCOSE, UA: NEGATIVE
KETONES UA: NEGATIVE
LEUKOCYTES UA: NEGATIVE
Nitrite, UA: NEGATIVE
PROTEIN UA: NEGATIVE
RBC UA: NEGATIVE
SPEC GRAV UA: 1.02 (ref 1.005–1.030)
UUROB: 0.2 mg/dL (ref 0.2–1.0)
pH, UA: 5.5 (ref 5.0–7.5)

## 2018-05-28 LAB — BAYER DCA HB A1C WAIVED: HB A1C: 6.7 % (ref <7.0)

## 2018-05-28 LAB — MICROSCOPIC EXAMINATION
BACTERIA UA: NONE SEEN
Epithelial Cells (non renal): NONE SEEN /hpf (ref 0–10)
RBC, UA: NONE SEEN /hpf (ref 0–2)
RENAL EPITHEL UA: NONE SEEN /HPF
WBC UA: NONE SEEN /HPF (ref 0–5)

## 2018-05-28 MED ORDER — TAMSULOSIN HCL 0.4 MG PO CAPS: 0.4000 mg | ORAL_CAPSULE | Freq: Every day | ORAL | 3 refills | Status: AC

## 2018-05-28 MED ORDER — CETIRIZINE HCL 10 MG PO TABS: 10.0000 mg | ORAL_TABLET | Freq: Every day | ORAL | 11 refills | Status: AC

## 2018-05-28 NOTE — Progress Notes (Signed)
Subjective:   Patient ID: Eduardo [name], male    DOB: [birth year], 72 y.o.   MRN: [id number] CC: Medical Management of Chronic Issues; Urinary Retention; and burning with urination  HPI: Eduardo [name] is a 72 y.o. male   Lower urinary tract symptoms: Feels like he is not completely emptying his bladder.  Having to go more often than usual.  Patient on tamsulosin at home.  Chronic abdominal pain: Seen by surgery for evaluation for possible cholecystectomy.  Patient remains on the fence whether this would be helpful or not.  Right upper quadrant pain is dull and constant.  Does not get worse with food.  Pressing on his stomach is one thing that can reliably make stomach pain worse, often for 2 to 3 hours after gentle pressure on the stomach.  He is staying fairly active, regularly walking and doing exercises at home including abdominal muscle strengthening exercises that do not seem to worsen pain.  Diabetes: Taking medicine regularly.  Avoiding sugary foods as much as he can though has had more recently with the holidays.  Relevant past medical, surgical, family and social history reviewed. Allergies and medications reviewed and updated. Social History   Tobacco Use  Smoking Status Former Smoker  . Packs/day: 0.75  . Years: 30.00  . Pack years: 22.50  . Types: Cigarettes  . Start date: 28  . Last attempt to quit: 05/31/1987  . Years since quitting: 31.0  Smokeless Tobacco Never Used   ROS: Per HPI   Objective:    BP 136/81   Pulse 64   Temp (!) 97.3 F (36.3 C) (Oral)   Ht 5\' 8"  (1.727 m)   Wt 178 lb (80.7 kg)   BMI 27.06 kg/m   Wt Readings from Last 3 Encounters:  05/28/18 178 lb (80.7 kg)  04/25/18 182 lb 3.2 oz (82.6 kg)  04/17/18 184 lb (83.5 kg)    Gen: NAD, alert, cooperative with exam, NCAT EYES: EOMI, no conjunctival injection, or no icterus CV: NRRR, normal S1/S2, no murmur, distal pulses 2+ b/l Resp: CTABL, no wheezes, normal WOB Abd:  +BS Ext: No edema, warm Neuro: Alert and oriented, strength equal b/l UE and LE, coordination grossly normal MSK: normal muscle bulk  Assessment & Plan:  Eduardo [name] was seen today for medical management of chronic issues, urinary retention and burning with urination.  Diagnoses and all orders for this visit:  Type 2 diabetes mellitus without complication, without long-term current use of insulin (HCC) A1c 6.7.  Continue current medicines.  Continue active lifestyle, avoiding sugary foods -     Bayer DCA Hb A1c Waived -     Urinalysis, Complete -     Urine Culture; Future -     Microalbumin / creatinine urine ratio  UTI symptoms UA without signs of infection.  We will follow-up culture.  -     Urine Culture; Future  Benign prostatic hyperplasia with urinary hesitancy Lower urinary tract symptoms       Ongoing symptoms.  Continue tamsulosin, refer to urology.       -     Ambulatory referral to Urology -     tamsulosin (FLOMAX) 0.4 MG CAPS capsule; Take 1 capsule (0.4 mg total) by mouth daily.  Allergic rhinitis due to other allergic trigger, unspecified seasonality Stable, continue below -     cetirizine (ZYRTEC) 10 MG tablet; Take 1 tablet (10 mg total) by mouth daily.  Other orders -     Microscopic Examination  Follow up plan: Return in about 3 months (around 08/27/2018). Assunta Found, MD [place]

## 2018-05-29 LAB — MICROALBUMIN / CREATININE URINE RATIO
CREATININE, UR: 83.1 mg/dL
Microalb/Creat Ratio: 19.1 mg/g creat (ref 0.0–30.0)
Microalbumin, Urine: 15.9 ug/mL

## 2018-05-31 LAB — SPECIMEN STATUS REPORT

## 2018-05-31 LAB — URINE CULTURE

## 2018-06-05 ENCOUNTER — Other Ambulatory Visit: Payer: Self-pay | Admitting: Pediatrics

## 2018-06-05 DIAGNOSIS — N3000 Acute cystitis without hematuria: Secondary | ICD-10-CM

## 2018-06-05 MED ORDER — NITROFURANTOIN MONOHYD MACRO 100 MG PO CAPS: 100.0000 mg | ORAL_CAPSULE | Freq: Two times a day (BID) | ORAL | 0 refills | Status: AC

## 2018-06-22 IMAGING — MR MR HEAD W/O CM
8 of 10 series · 40 of 48 positions shown · non-contrast
Comparison: None.

CLINICAL DATA: Altered mental status.

EXAM:
MRI HEAD WITHOUT CONTRAST
TECHNIQUE: Multiplanar, multiecho pulse sequences of the brain and surrounding
structures were obtained without intravenous contrast.

[Series 3: DWI · axial · 3.0mm · 0.70mm/px · z∈[-144,+16]mm · 7 of 55 slices shown (1 of 4)]
[im 1/55]
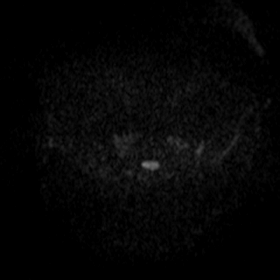
[im 10/55]
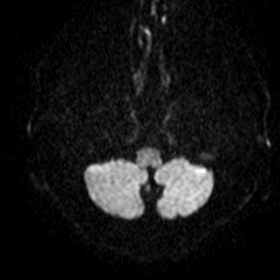
[im 19/55]
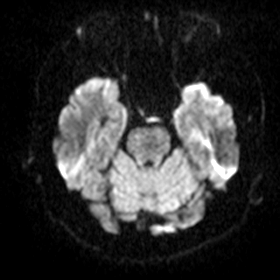
[im 28/55]
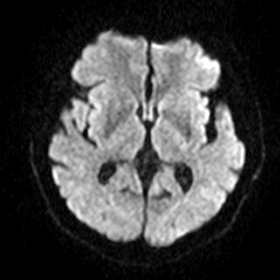
[im 37/55]
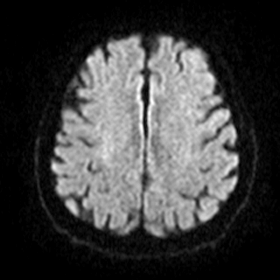
[im 46/55]
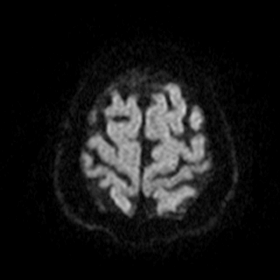
[im 55/55]
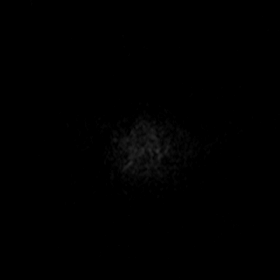

[Series 4: DWI · axial · 3.0mm · 0.74mm/px · z∈[-145,+16]mm · 7 of 54 slices shown (2 of 4)]
[im 1/54]
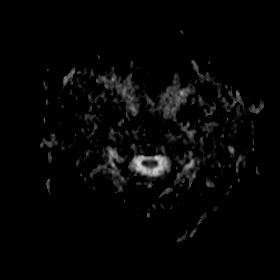
[im 9/54]
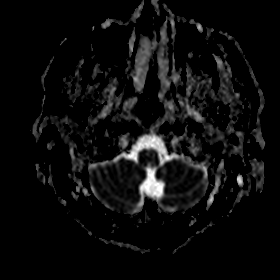
[im 18/54]
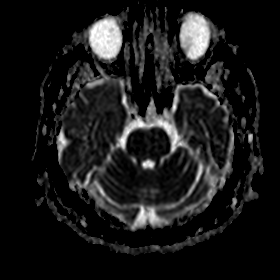
[im 27/54]
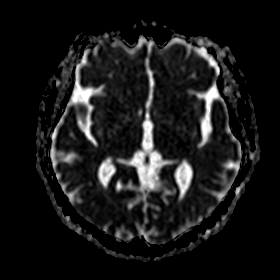
[im 36/54]
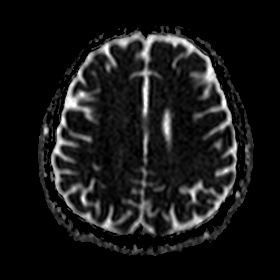
[im 45/54]
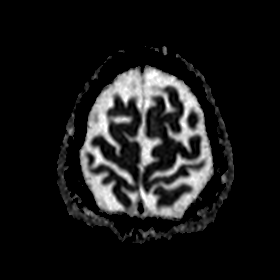
[im 54/54]
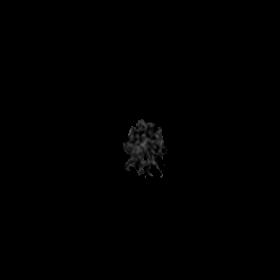

[Series 5: DWI · coronal · 5.0mm · 0.47mm/px · 4 of 34 slices shown (3 of 4)]
[im 1/34]
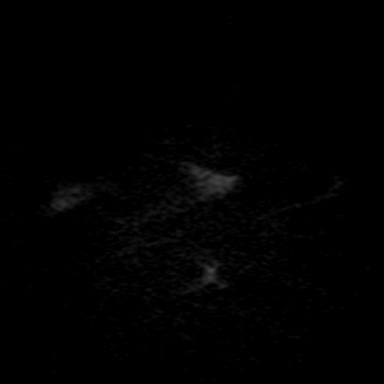
[im 12/34]
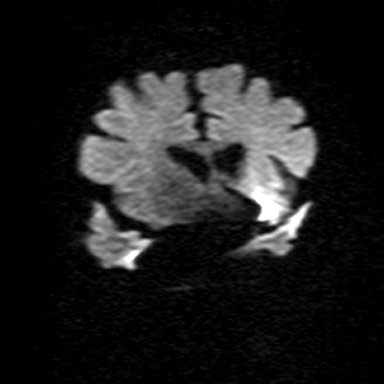
[im 23/34]
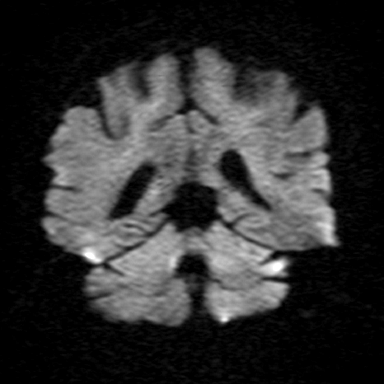
[im 34/34]
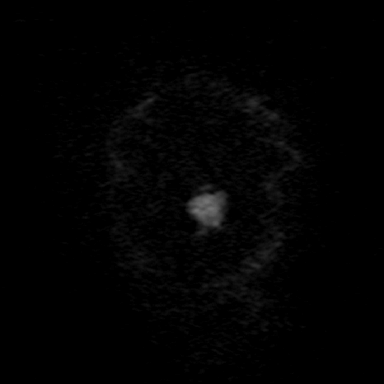

[Series 6: DWI · coronal · 5.0mm · 0.50mm/px · 4 of 34 slices shown (4 of 4)]
[im 1/34]
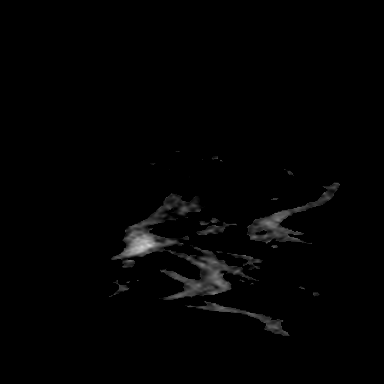
[im 12/34]
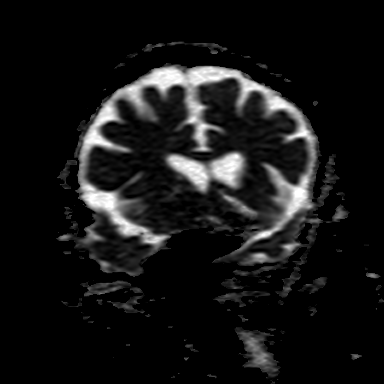
[im 23/34]
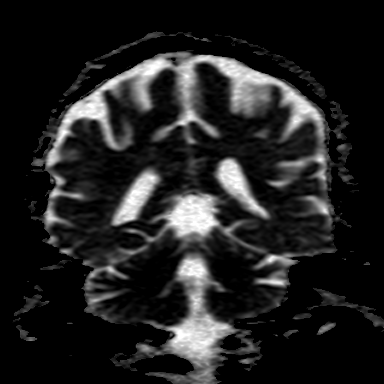
[im 34/34]
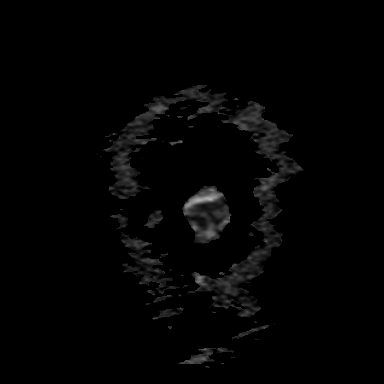

[Series 7: T2 · axial · 5.0mm · 0.67mm/px · z∈[-136,+5]mm · 3 of 23 slices shown (1 of 2)]
[im 1/23]
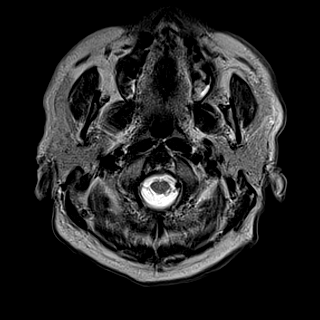
[im 12/23]
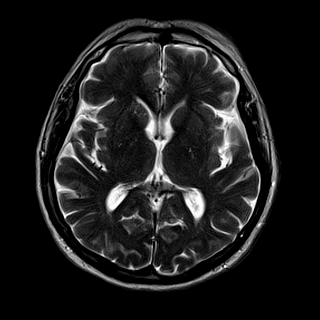
[im 23/23]
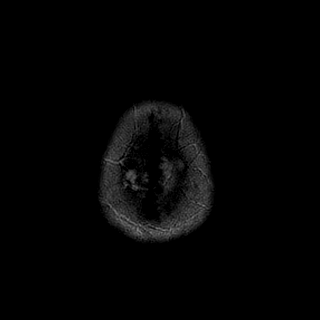

[Series 8: FLAIR · axial · 5.0mm · 0.79mm/px · z∈[-135,+6]mm · 3 of 23 slices shown]
[im 1/23]
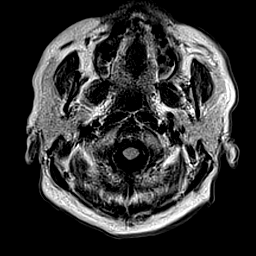
[im 12/23]
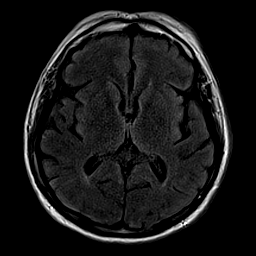
[im 23/23]
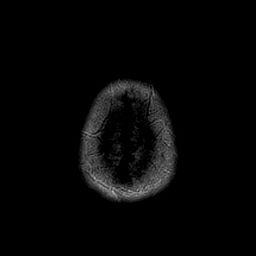

[Series 9: T1 · axial · 2.0mm · 0.41mm/px · z∈[-159,+9]mm · 8 of 86 slices shown]
[im 1/86]
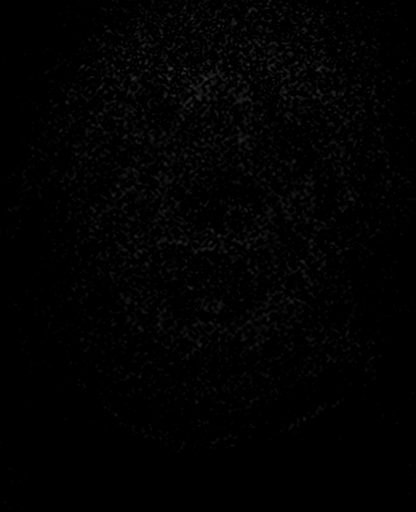
[im 18/86]
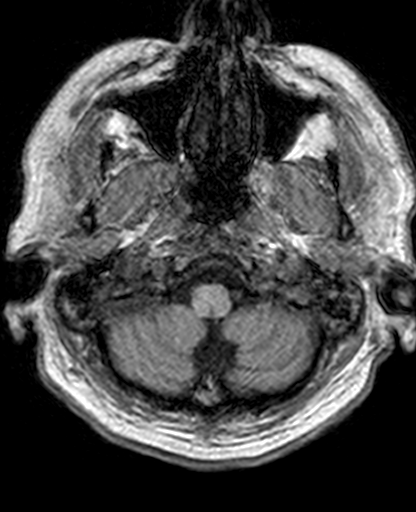
[im 26/86]
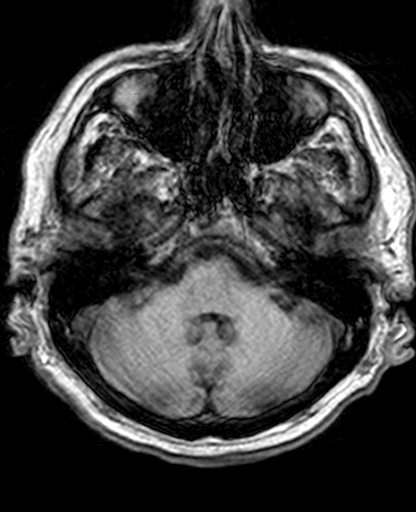
[im 35/86]
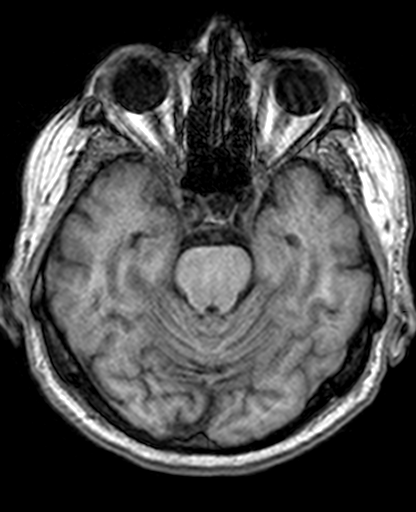
[im 52/86]
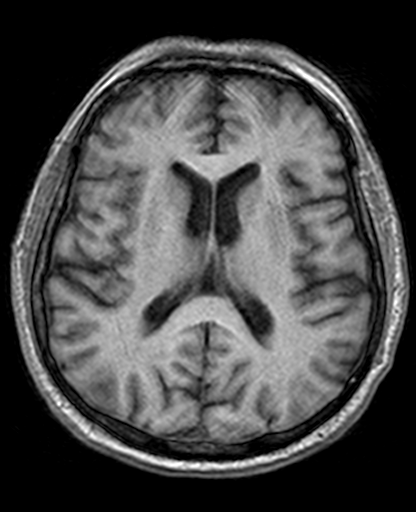
[im 60/86]
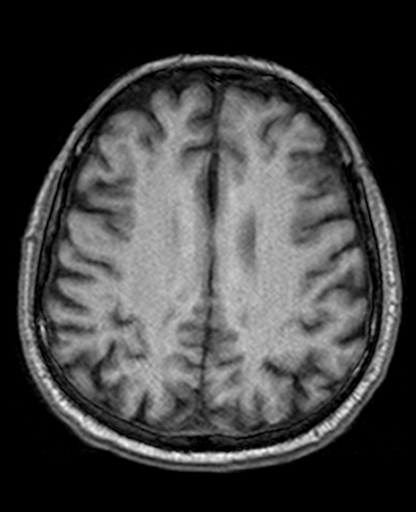
[im 69/86]
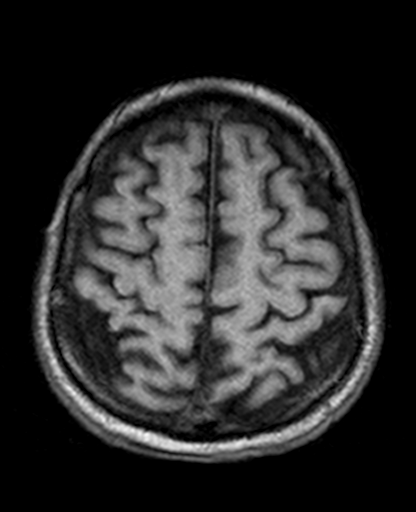
[im 86/86]
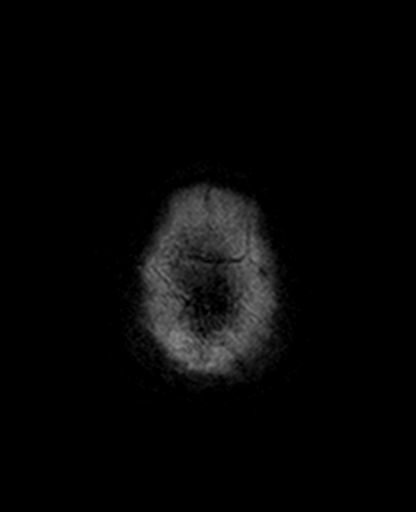

[Series 11: T2 · coronal · 5.0mm · 0.67mm/px · 4 of 28 slices shown (2 of 2)]
[im 1/28]
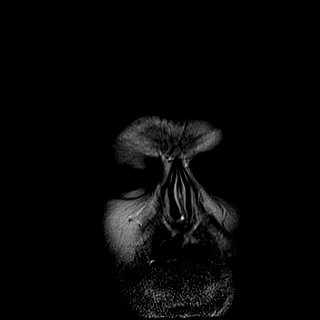
[im 10/28]
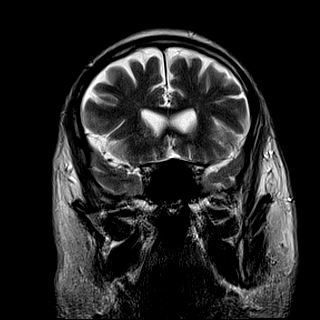
[im 19/28]
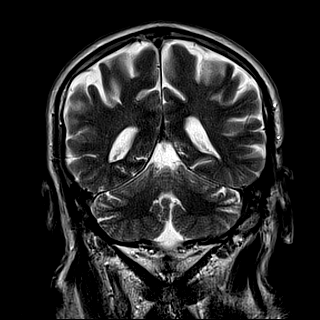
[im 28/28]
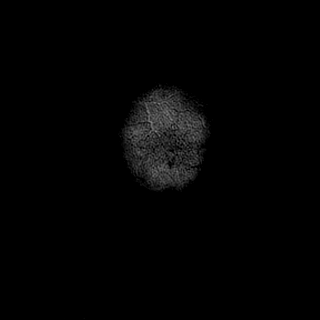

[40 of 48 positions shown; findings below may reference images not displayed]

FINDINGS: Brain: No acute infarction, hemorrhage, hydrocephalus, extra-axial
collection or mass lesion. No unexpected volume loss or white matter
signal.

Vascular: Normal flow voids.

Skull and upper cervical spine: Normal marrow signal.

Sinuses/Orbits: Bilateral cataract resection.

Other: Intermittently motion degraded, which could obscure subtle
findings.
IMPRESSION: Unremarkable appearance of the brain.  No explanation for symptoms.

## 2018-08-28 ENCOUNTER — Other Ambulatory Visit: Payer: Self-pay

## 2018-08-28 ENCOUNTER — Encounter: Payer: Self-pay | Admitting: Family Medicine

## 2018-08-28 ENCOUNTER — Ambulatory Visit (INDEPENDENT_AMBULATORY_CARE_PROVIDER_SITE_OTHER): Payer: Medicare Other | Admitting: Family Medicine

## 2018-08-28 DIAGNOSIS — E119 Type 2 diabetes mellitus without complications: Secondary | ICD-10-CM | POA: Diagnosis not present

## 2018-08-28 DIAGNOSIS — J439 Emphysema, unspecified: Secondary | ICD-10-CM | POA: Diagnosis not present

## 2018-08-28 DIAGNOSIS — N5312 Painful ejaculation: Secondary | ICD-10-CM | POA: Diagnosis not present

## 2018-08-28 DIAGNOSIS — I10 Essential (primary) hypertension: Secondary | ICD-10-CM | POA: Diagnosis not present

## 2018-08-28 DIAGNOSIS — E782 Mixed hyperlipidemia: Secondary | ICD-10-CM

## 2018-08-28 MED ORDER — CEPHALEXIN 500 MG PO CAPS: 500.0000 mg | ORAL_CAPSULE | Freq: Four times a day (QID) | ORAL | 0 refills | Status: DC

## 2018-08-28 NOTE — Progress Notes (Signed)
Virtual Visit via telephone Note  I connected with Eduardo [name] on 08/28/18 at [phone] by telephone and verified that I am speaking with the correct person using two identifiers. Eduardo [name] is currently located at [place] and no other people are currently with her during visit. The provider, [name] Shivonne Schwartzman, MD is located in their office at time of visit.  Call ended at 0828  I discussed the limitations, risks, security and privacy concerns of performing an evaluation and management service by telephone and the availability of in person appointments. I also discussed with the patient that there may be a patient responsible charge related to this service. The patient expressed understanding and agreed to proceed.   History and Present Illness: Type 2 diabetes mellitus Patient comes in today for recheck of his diabetes. Patient has been currently taking metformin, 94-122. Patient is not currently on an ACE inhibitor/ARB. Patient has not seen an ophthalmologist this year. Patient denies any issues with their feet.   Hyperlipidemia Patient is coming in for recheck of his hyperlipidemia. The patient is currently taking Zetia and gemfibrozil. They deny any issues with myalgias or history of liver damage from it. They deny any focal numbness or weakness or chest pain.   Hypertension Patient is currently on carvedilol, and their blood pressure today is 125-132/70s. Patient denies any lightheadedness or dizziness. Patient denies headaches, blurred vision, chest pains, shortness of breath, or weakness. Denies any side effects from medication and is content with current medication.   COPD Patient is coming in for COPD recheck today.  He is currently on Spiriva and doesn't need Symbicort frequently.  He has a mild chronic cough but denies any major coughing spells or wheezing spells.  He has 0nighttime symptoms per week and 0daytime symptoms per week currently.   3-4 weeks ago  when he finished intercourse had burning sensation and she had burning as well as during intercourse.  Patient denies any blood with ejaculation.  Patient denies dysuria.  He says he is not had it before until just recently.  He does not have any pain except for during intercourse and ejaculation.  He denies any fevers or chills or abdominal pain or pain radiating anywhere else.  No diagnosis found.  Outpatient Encounter Medications as of 08/28/2018  Medication Sig   albuterol (PROVENTIL HFA;VENTOLIN HFA) 108 (90 BASE) MCG/ACT inhaler Inhale 1 puff into the lungs every 6 (six) hours as needed for wheezing or shortness of breath.    aspirin EC 81 MG EC tablet Take 1 tablet (81 mg total) by mouth daily.   budesonide-formoterol (SYMBICORT) 80-4.5 MCG/ACT inhaler Inhale 2 puffs into the lungs 2 (two) times daily.   carvedilol (COREG) 12.5 MG tablet Take 12.5 mg by mouth 2 (two) times daily.    cetirizine (ZYRTEC) 10 MG tablet Take 1 tablet (10 mg total) by mouth daily.   Cholecalciferol (VITAMIN D3) 5000 units CAPS Take 5,000 Units by mouth daily.   clopidogrel (PLAVIX) 75 MG tablet Take 75 mg by mouth daily.   DULoxetine (CYMBALTA) 60 MG capsule Take 1 capsule (60 mg total) by mouth daily.   ezetimibe (ZETIA) 10 MG tablet Take 10 mg by mouth at bedtime.    gemfibrozil (LOPID) 600 MG tablet Take 600 mg by mouth 2 (two) times daily before a meal.   metFORMIN (GLUCOPHAGE) 1000 MG tablet Take 1,000 mg by mouth 2 (two) times daily with a meal.   nitroGLYCERIN (NITROSTAT) 0.4 MG SL tablet Place 0.4  mg under the tongue every 5 (five) minutes as needed for chest pain.   pantoprazole (PROTONIX) 40 MG tablet Take 1 tablet (40 mg total) by mouth 2 (two) times daily before a meal. Before breakfast and supper   tamsulosin (FLOMAX) 0.4 MG CAPS capsule Take 1 capsule (0.4 mg total) by mouth daily.   tetrahydrozoline 0.05 % ophthalmic solution Place 1-2 drops into both eyes 3 (three) times daily as  needed (for dry/irritated eyes).   tiotropium (SPIRIVA) 18 MCG inhalation capsule Place 18 mcg into inhaler and inhale daily as needed (for breathing).   [DISCONTINUED] omeprazole (PRILOSEC) 20 MG capsule Take 20 mg by mouth daily.   No facility-administered encounter medications on file as of 08/28/2018.     Review of Systems  Observations/Objective: Patient sounds comfortable and not in any distress  Assessment and Plan: Problem List Items Addressed This Visit      Cardiovascular and Mediastinum   HTN (hypertension) (Chronic)     Respiratory   COPD (chronic obstructive pulmonary disease) (HCC) (Chronic)     Endocrine   Type 2 diabetes mellitus without complication, without long-term current use of insulin (HCC) - Primary (Chronic)     Other   Hyperlipidemia (Chronic)    Other Visit Diagnoses    Painful ejaculation       Relevant Medications   cephALEXin (KEFLEX) 500 MG capsule      For his painful ejaculations, will try Keflex and if it does not help then he will call back.  Follow Up Instructions:  Return to office in 3 months   I discussed the assessment and treatment plan with the patient. The patient was provided an opportunity to ask questions and all were answered. The patient agreed with the plan and demonstrated an understanding of the instructions.   The patient was advised to call back or seek an in-person evaluation if the symptoms worsen or if the condition fails to improve as anticipated.  The above assessment and management plan was discussed with the patient. The patient verbalized understanding of and has agreed to the management plan. Patient is aware to call the clinic if symptoms persist or worsen. Patient is aware when to return to the clinic for a follow-up visit. Patient educated on when it is appropriate to go to the emergency department.    I provided 9 minutes of non-face-to-face time during this encounter.    Worthy Rancher, MD

## 2018-08-28 NOTE — Addendum Note (Signed)
Addended by: Caryl Pina on: 08/28/2018 08:51 AM   Modules accepted: Level of Service

## 2018-08-30 ENCOUNTER — Other Ambulatory Visit: Payer: Self-pay | Admitting: *Deleted

## 2018-08-30 DIAGNOSIS — I712 Thoracic aortic aneurysm, without rupture, unspecified: Secondary | ICD-10-CM

## 2018-10-31 ENCOUNTER — Ambulatory Visit: Payer: Medicare Other | Admitting: Pediatrics

## 2018-11-07 ENCOUNTER — Ambulatory Visit
Admission: RE | Admit: 2018-11-07 | Discharge: 2018-11-07 | Disposition: A | Discharge status: Home / Self Care | Payer: Medicare Other | Source: Ambulatory Visit | Attending: Cardiothoracic Surgery | Admitting: Cardiothoracic Surgery

## 2018-11-07 ENCOUNTER — Other Ambulatory Visit: Payer: Self-pay

## 2018-11-07 DIAGNOSIS — I712 Thoracic aortic aneurysm, without rupture, unspecified: Secondary | ICD-10-CM

## 2018-11-07 MED ORDER — IOPAMIDOL (ISOVUE-370) INJECTION 76%
75.0000 mL | Freq: Once | INTRAVENOUS | Status: AC | PRN
  Administered 2018-11-07: 12:00:00 75 mL via INTRAVENOUS

## 2018-11-08 ENCOUNTER — Encounter: Payer: Self-pay | Admitting: Cardiothoracic Surgery

## 2018-11-08 ENCOUNTER — Ambulatory Visit (INDEPENDENT_AMBULATORY_CARE_PROVIDER_SITE_OTHER): Payer: Medicare Other | Admitting: Cardiothoracic Surgery

## 2018-11-08 VITALS — BP 132/80 | HR 64 | Temp 97.9°F | Resp 16 | Ht 68.0 in | Wt 176.0 lb

## 2018-11-08 DIAGNOSIS — I712 Thoracic aortic aneurysm, without rupture, unspecified: Secondary | ICD-10-CM

## 2018-11-08 DIAGNOSIS — Z951 Presence of aortocoronary bypass graft: Secondary | ICD-10-CM

## 2018-11-08 NOTE — Progress Notes (Signed)
[address] [address]       Emerald [name],Eduardo [name]             [phone]                    Eduardo [name] [place] Medical Record #[id number] Date of Birth: 08/08/1945  Referring: Eduardo Commons, MD Primary Care: Dettinger, Eduardo Kaufmann, MD  Chief Complaint:    Chief Complaint  Patient presents with   TAA    1 yr f/u with CTA CHEST 11/07/18  OPERATIVE REPORT PREOPERATIVE DIAGNOSIS:  Unstable angina with 3-vessel coronary artery disease. POSTOPERATIVE DIAGNOSIS:  Unstable angina with 3-vessel coronary artery disease. SURGICAL PROCEDURE:  Coronary artery bypass grafting x4 with the left internal mammary to the left anterior descending coronary artery, reverse saphenous vein graft to the 2nd diagonal coronary artery, reverse saphenous vein graft to the first obtuse marginal, reverse saphenous vein graft to the posterior descending coronary artery with bilateral greater saphenous thigh endoscopic vein harvesting  History of Present Illness:    Eduardo [name] 73 y.o. male is seen in the office  today for in follow up for midly dilated aorta noted ,  Since last seen he has been doing well.  Staying busy around his house.  He denies any recurrent angina.  Comes in today with a follow-up CT scan of the chest to evaluate his aorta     Current Activity/ Functional Status:  Patient is independent with mobility/ambulation, transfers, ADL's, IADL's.   Zubrod Score: At the time of surgery this patients most appropriate activity status/level should be described as: []     0    Normal activity, no symptoms [x]     1    Restricted in physical strenuous activity but ambulatory, able to do out light work []     2    Ambulatory and capable of self care, unable to do work activities, up and about               >50 % of waking hours                              []     3    Only limited self care, in bed greater than 50% of waking hours []     4    Completely  disabled, no self care, confined to bed or chair []     5    Moribund   Past Medical History:  Diagnosis Date   Arthritis    Bilateral carotid artery disease ([place]) 04/28/2015   1-39 percent bilateral stenosis noted on Doppler    CAD (coronary artery disease)    a. s/p CABG in 2016 with LIMA-LAD, reverse SVG-D2, reverse SVG-OM1, and reverse SVG-PDA   Cervical disc disease    Chemical exposure    agent orange    Colon polyps    COPD (chronic obstructive pulmonary disease) ([place])    Diabetes mellitus without complication ([place])    Diabetic neuropathy ([place]) 03/07/2018   Diverticulitis    Gallstones    GERD (gastroesophageal reflux disease)    Hyperlipidemia    Hypertension    Lumbar disc disease    Multiple gastric ulcers    Obesity (BMI 30-39.9)    Peripheral neuropathy    in all extremities   PTSD (post-traumatic stress disorder)    Sleep apnea    can't wear cpap  Thoracic ascending aortic aneurysm [facility])     Past Surgical History:  Procedure Laterality Date   ANAL RECTAL MANOMETRY N/A 02/05/2018   Procedure: ANO RECTAL MANOMETRY;  Surgeon: [name], MD;  Location: [facility];  Service: Endoscopy;  Laterality: N/A;   CARDIAC CATHETERIZATION     CARDIAC CATHETERIZATION  03/18/14   difficult to determine culprit vesel   CARDIAC CATHETERIZATION N/A 04/29/2015   Procedure: Left Heart Cath and Coronary Angiography;  Surgeon: [name], MD;  Location: [place] CV LAB;  Service: Cardiovascular;  Laterality: N/A;   CARPOMETACARPAL ([facility]) FUSION OF THUMB Right 08/04/2016   Procedure: CARPOMETACARPAL [facility]) FUSION OF THUMB;  Surgeon: Iran Planas, MD;  Location: [place];  Service: Orthopedics;  Laterality: Right;   CERVICAL SPINE SURGERY     COLONOSCOPY N/A 04/05/2017   Procedure: COLONOSCOPY;  Surgeon: Rogene Houston, MD;  [facility];  Service: Endoscopy;  Laterality: N/A;  830   CORONARY ARTERY BYPASS GRAFT N/A 05/04/2015    Procedure: CORONARY ARTERY BYPASS GRAFTING (CABG)  x four,  using left internal mammary artery, and bilateral thigh greater saphenous veins;  Surgeon: [name], MD;  Location: [place];  Service: Open Heart Surgery;  Laterality: N/A;   EYE SURGERY Bilateral    cataract   FOOT SURGERY Left    "little toe"   I&D EXTREMITY Left 05/30/2013   Procedure: IRRIGATION AND DEBRIDEMENT Left Hand and Foreign body removal;  Surgeon: [name], MD;  Location: [place];  Service: Orthopedics;  Laterality: Left;   LEFT HEART CATHETERIZATION WITH CORONARY ANGIOGRAM N/A 03/18/2014   Procedure: LEFT HEART CATHETERIZATION WITH CORONARY ANGIOGRAM;  Surgeon: [name], MD;  Location: [facility] CATH LAB;  Service: Cardiovascular;  Laterality: N/A;   LUMBAR LAMINECTOMY     PARTIAL COLECTOMY     For diverticulitis   PTCA     unsuccesful   REPLACEMENT TOTAL KNEE     right    SHOULDER SURGERY Bilateral    x 2 (on both shoulder)_   TEE WITHOUT CARDIOVERSION N/A 05/04/2015   Procedure: TRANSESOPHAGEAL ECHOCARDIOGRAM (TEE);  Surgeon: [name], MD;  Location: [place];  Service: Open Heart Surgery;  Laterality: N/A;   thumb surgery Left     Family History  Problem Relation Age of Onset   Diabetes Father    Heart disease Father    Hypertension Father    Diabetes Paternal Grandmother    Heart disease Paternal Grandmother    Hypertension Paternal Grandmother    Diabetes Paternal Uncle    Heart disease Paternal Uncle    Heart disease Paternal Uncle    Hypertension Paternal Uncle    Heart disease Paternal Uncle    Hypertension Mother    Heart disease Mother    Drug abuse Son    Colon cancer Neg Hx    Esophageal cancer Neg Hx    Pancreatic cancer Neg Hx    Stomach cancer Neg Hx    Liver disease Neg Hx     Social History   Social History   Marital status: Married    Spouse name: N/A   Number of children: N/A   Years of education: N/A   Occupational  History   Not on file.   Social History Main Topics   Smoking status: Former Smoker    Packs/day: 0.75    Years: 30.00    Types: Cigarettes    Start date: 01/02/1961    Quit date: 05/31/1987  Smokeless tobacco: Never Used   Alcohol use No   Drug use: No   Sexual activity: Not on file    Social History   Tobacco Use  Smoking Status Former Smoker   Packs/day: 0.75   Years: 30.00   Pack years: 22.50   Types: Cigarettes   Start date: 2   Quit date: 05/31/1987   Years since quitting: 31.4  Smokeless Tobacco Never Used    Social History   Substance and Sexual Activity  Alcohol Use No   Alcohol/week: 0.0 standard drinks     Allergies  Allergen Reactions   Statins Other (See Comments)    Body aches   Benazepril     hyperkalemia   Ciprofloxacin     Per cardiac surgery    Current Outpatient Medications  Medication Sig Dispense Refill   albuterol (PROVENTIL HFA;VENTOLIN HFA) 108 (90 BASE) MCG/ACT inhaler Inhale 1 puff into the lungs every 6 (six) hours as needed for wheezing or shortness of breath.      budesonide-formoterol (SYMBICORT) 80-4.5 MCG/ACT inhaler Inhale 2 puffs into the lungs 2 (two) times daily. 1 Inhaler 3   carvedilol (COREG) 12.5 MG tablet Take 12.5 mg by mouth 2 (two) times daily.      cetirizine (ZYRTEC) 10 MG tablet Take 1 tablet (10 mg total) by mouth daily. 30 tablet 11   Cholecalciferol (VITAMIN D3) 5000 units CAPS Take 5,000 Units by mouth daily.     clopidogrel (PLAVIX) 75 MG tablet Take 75 mg by mouth daily.     DULoxetine (CYMBALTA) 60 MG capsule Take 90 mg by mouth daily.     ezetimibe (ZETIA) 10 MG tablet Take 10 mg by mouth at bedtime.      gemfibrozil (LOPID) 600 MG tablet Take 600 mg by mouth 2 (two) times daily before a meal.     metFORMIN (GLUCOPHAGE) 1000 MG tablet Take 1,000 mg by mouth 2 (two) times daily with a meal.     nitroGLYCERIN (NITROSTAT) 0.4 MG SL tablet Place 0.4 mg under the tongue every 5  (five) minutes as needed for chest pain.     pantoprazole (PROTONIX) 40 MG tablet Take 1 tablet (40 mg total) by mouth 2 (two) times daily before a meal. Before breakfast and supper 180 tablet 0   tamsulosin (FLOMAX) 0.4 MG CAPS capsule Take 1 capsule (0.4 mg total) by mouth daily. 90 capsule 3   tetrahydrozoline 0.05 % ophthalmic solution Place 1-2 drops into both eyes 3 (three) times daily as needed (for dry/irritated eyes).     tiotropium (SPIRIVA) 18 MCG inhalation capsule Place 18 mcg into inhaler and inhale daily as needed (for breathing).     aspirin EC 81 MG EC tablet Take 1 tablet (81 mg total) by mouth daily.     No current facility-administered medications for this visit.       Review of Systems:     Cardiac Review of Systems: Y or N  Chest Pain [  N  ]  Resting SOB [ N ] Exertional SOB  Aqua.Slicker ]  Orthopnea [ N ]   Pedal Edema [  N ]    Palpitations [ N ] Syncope  [ N]   Presyncope Aqua.Slicker  ]  General Review of Systems: [Y] = yes [  ]=no Constitional: recent weight change [  ];  Wt loss over the last 3 months [   ] anorexia [  ]; fatigue [  ]; nausea [  ]; night sweats [  ];  fever [  ]; or chills [  ];          Dental: poor dentition[  ]; Last Dentist visit:   Eye : blurred vision [  ]; diplopia [   ]; vision changes [  ];  Amaurosis fugax[  ]; Resp: cough [  ];  wheezing[  ];  hemoptysis[  ]; shortness of breath[  ]; paroxysmal nocturnal dyspnea[  ]; dyspnea on exertion[  ]; or orthopnea[  ];  GI:  gallstones[  ], vomiting[  ];  dysphagia[  ]; melena[  ];  hematochezia [  ]; heartburn[  ];   Hx of  Colonoscopy[  ]; GU: kidney stones [  ]; hematuria[  ];   dysuria [  ];  nocturia[  ];  history of     obstruction [  ]; urinary frequency [  ]             Skin: rash, swelling[  ];, hair loss[  ];  peripheral edema[  ];  or itching[  ]; Musculosketetal: myalgias[  ];  joint swelling[  ];  joint erythema[  ];  joint pain[y  ];  back pain[y  ];  Heme/Lymph: bruising[  ];  bleeding[  ];   anemia[  ];  Neuro: TIA[  ];  headaches[  ];  stroke[  ];  vertigo[  ];  seizures[  ];   paresthesias[  ];  difficulty walking[  ];  Psych:depression[  ]; anxiety[  ];  Endocrine: diabetes[  ];  thyroid dysfunction[  ];  Immunizations: Flu up to date [  ]; Pneumococcal up to date [  ];  Other:  Physical Exam: BP 132/80 (BP Location: Right Arm, Patient Position: Sitting, Cuff Size: Normal)    Pulse 64    Temp 97.9 F (36.6 C) (Skin)    Resp 16    Ht 5\' 8"  (1.727 m)    Wt 176 lb (79.8 kg)    SpO2 93% Comment: RA   BMI 26.76 kg/m   PHYSICAL EXAMINATION: General appearance: alert, cooperative and no distress Head: Normocephalic, without obvious abnormality, atraumatic Lymph nodes: Cervical, supraclavicular, and axillary nodes normal. Resp: clear to auscultation bilaterally Cardio: regular rate and rhythm, S1, S2 normal, no murmur, click, rub or gallop GI: soft, non-tender; bowel sounds normal; no masses,  no organomegaly Extremities: extremities normal, atraumatic, no cyanosis or edema and Homans sign is negative, no sign of DVT Neurologic: Grossly normal  Sternum stable and well-healed \ Diagnostic Studies & Laboratory data:     Recent Radiology Findings:  Ct Angio Chest Aorta W &/or Wo Contrast  Result Date: 11/07/2018 CLINICAL DATA:  Followup known thoracic aortic aneurysm. EXAM: CT ANGIOGRAPHY CHEST WITH CONTRAST TECHNIQUE: Multidetector CT imaging of the chest was performed using the standard protocol during bolus administration of intravenous contrast. Multiplanar CT image reconstructions and MIPs were obtained to evaluate the vascular anatomy. CONTRAST:  13mL ISOVUE-370 IOPAMIDOL (ISOVUE-370) INJECTION 76% COMPARISON:  CT scans 08/24/2016 and 10/19/2017 FINDINGS: Cardiovascular: The heart is normal in size. No pericardial effusion. Stable tortuosity, ectasia and calcification of the thoracic aorta. Stable surgical changes from coronary artery bypass surgery. Stable fusiform aneurysmal  dilatation of the ascending thoracic aorta with maximum measurement of 4.5 cm at the level of the sinus of Valsalva on both the sagittal reformatted images and the axial images. This measured 4.4 cm on the prior study. No dissection. The branch vessels are patent. Stable three-vessel coronary artery calcifications. The pulmonary arteries appear normal.  Mediastinum/Nodes: No mediastinal or hilar mass or adenopathy. The esophagus is grossly normal. Lungs/Pleura: Stable emphysematous changes and pulmonary scarring. No acute pulmonary findings or worrisome pulmonary lesions. No interstitial lung disease or bronchiectasis. Upper Abdomen: No significant upper abdominal findings. Small layering gallstones are noted the gallbladder. Musculoskeletal: No chest wall mass, supraclavicular or axillary adenopathy. The thyroid gland appears normal. No significant bony findings. Review of the MIP images confirms the above findings. IMPRESSION: 1. Overall stable fusiform aneurysmal dilatation of the ascending thoracic aorta with maximal measurement of 4.5 cm at the level of the sinus of Valsalva. No aortic dissection. Recommend continued surveillance. 2. Stable surgical changes from coronary artery bypass surgery. 3. No mediastinal or hilar mass or adenopathy. 4. Emphysematous changes but no acute pulmonary findings or worrisome pulmonary lesions. 5. Stable extensive three-vessel coronary artery calcifications. Aortic Atherosclerosis (ICD10-I70.0) and Emphysema (ICD10-J43.9). Electronically Signed   By: Marijo Sanes M.D.   On: 11/07/2018 11:53   I have independently reviewed the above radiology studies  and reviewed the findings with the patient.  Dg Lumbar Spine 2-3 Views  Result Date: 10/19/2017 CLINICAL DATA:  Low back pain. EXAM: LUMBAR SPINE - 2-3 VIEW COMPARISON:  CT 08/04/2017. FINDINGS: Lumbar spine numbered with the lowest segmented appearing lumbar shaped vertebrae on lateral view as L5. L3-L4 posterior and  interbody fusion. Hardware intact. Anatomic alignment. Lumbar scoliosis concave right. No acute bony abnormality. Aortic atherosclerotic vascular calcification. IMPRESSION: 1. L4-L5 posterior interbody fusion. Hardware intact. Anatomic alignment. 2. Lumbar spine scoliosis concave right. Diffuse severe multilevel degenerative change. No acute bony abnormality. 3.  Aortoiliac atherosclerotic vascular disease. Electronically Signed   By: Marcello Moores  Register   On: 10/19/2017 07:26   Ct Angio Chest Aorta W &/or Wo Contrast  Result Date: 10/19/2017 CLINICAL DATA:  Follow-up examination for thoracic aortic aneurysm, annual surveillance. EXAM: CT ANGIOGRAPHY CHEST WITH CONTRAST TECHNIQUE: Multidetector CT imaging of the chest was performed using the standard protocol during bolus administration of intravenous contrast. Multiplanar CT image reconstructions and MIPs were obtained to evaluate the vascular anatomy. CONTRAST:  41mL ISOVUE-370 IOPAMIDOL (ISOVUE-370) INJECTION 76% COMPARISON:  Prior CTA from 08/24/2016 FINDINGS: Cardiovascular: Atherosclerotic change seen throughout the intrathoracic aorta without evidence for dissection. Visualized great vessels patent. Sequelae of prior coronary artery bypass grafting. Aneurysmal dilatation of the aortic root measures 5.2 cm, previously 5.1 cm. Ascending thoracic aorta measures 4.2 cm, consistent with aneurysm, an unchanged previously measuring 4.2 cm. Transverse aortic arch measures 2.9 cm, previously 2.8 cm. Proximal descending thoracic aorta measures 3.5 cm, previously 3.4 cm. Mediastinum/Nodes: Thyroid normal. No pathologically enlarged mediastinal, hilar, or axillary lymph nodes identified. Esophagus is normal. Lungs/Pleura: Tracheobronchial tree intact and patent. Upper lobe predominant centrilobular and paraseptal emphysema. Stable 5 mm nodules along the left major fissure, most likely small intra pulmonic lymph nodes given stability. No new pulmonary nodule or mass. No  acute pulmonary disease. Upper Abdomen: Cholelithiasis noted. Remainder the visualized upper abdomen is unremarkable in demonstrates no acute finding. Musculoskeletal: No acute osseus abnormality. No worrisome lytic or blastic osseous lesions. External soft tissues demonstrate no acute finding. Review of the MIP images confirms the above findings. IMPRESSION: 1. Stable size of 4.2 cm ascending thoracic aortic aneurysm. Aneurysmal dilatation of the aortic root measures 5.2 cm, previously 5.1 cm. Recommend continued annual imaging followup by CTA or MRA. This recommendation follows 2010 ACCF/AHA/AATS/ACR/ASA/SCA/SCAI/SIR/STS/SVM Guidelines for the Diagnosis and Management of Patients with Thoracic Aortic Disease. Circulation. 2010; 121: U272-Z366. 2. Upper lobe predominant centrilobular and paraseptal emphysema  with no other acute cardiopulmonary abnormality. 3. Cholelithiasis. Electronically Signed   By: Jeannine Boga M.D.   On: 10/19/2017 14:09     Ct Angio Chest Aorta W &/or Wo Contrast  Result Date: 08/24/2016 CLINICAL DATA:  Thoracic aortic aneurysm without rupture. EXAM: CT ANGIOGRAPHY CHEST WITH CONTRAST TECHNIQUE: Multidetector CT imaging of the chest was performed using the standard protocol during bolus administration of intravenous contrast. Multiplanar CT image reconstructions and MIPs were obtained to evaluate the vascular anatomy. CONTRAST:  100 mL of Isovue 370 intravenously. COMPARISON:  CT scan of May 03, 2015. FINDINGS: Cardiovascular: Atherosclerosis of thoracic aorta is noted without dissection. Status post coronary artery bypass graft. Great vessels are widely patent. Aneurysmal dilatation of aortic root is noted at 5.1 cm. Ascending thoracic aorta measures 4.2 cm consistent with aneurysm. Transverse aortic arch measures 2.8 cm. Proximal descending thoracic aorta measures 3.4 cm. Mediastinum/Nodes: No enlarged mediastinal, hilar, or axillary lymph nodes. Thyroid gland, trachea, and  esophagus demonstrate no significant findings. Lungs/Pleura: No pneumothorax or pleural effusion is noted. Stable 5 mm nodule is seen along left major fissure best seen on image number 33 of series 8. Another stable 5 mm nodule is noted slightly more inferiorly and medially. Given the lack of change, no further follow-up required for these nodules. No acute pulmonary disease is noted. Upper Abdomen: Fatty infiltration of the liver. No other abnormality seen in the visualized portion of the abdomen. Musculoskeletal: No chest wall abnormality. No acute or significant osseous findings. Review of the MIP images confirms the above findings. IMPRESSION: 4.2 cm ascending thoracic aortic aneurysm. Aneurysmal dilatation of aortic root measured at 5.1 cm. Recommend annual imaging followup by CTA or MRA. This recommendation follows 2010 ACCF/AHA/AATS/ACR/ASA/SCA/SCAI/SIR/STS/SVM Guidelines for the Diagnosis and Management of Patients with Thoracic Aortic Disease. Circulation. 2010; 121: T024-O973. Stable left lung nodules compared to prior exam. These can be considered benign at this point, with no further follow-up required. Probable fatty infiltration of liver. Electronically Signed   By: Marijo Conception, M.D.   On: 08/24/2016 09:30  Study Result   CLINICAL DATA:  Preoperative for CABG. Reported history of dilated aortic root on outside imaging.  EXAM: CT CHEST WITHOUT CONTRAST  TECHNIQUE: Multidetector CT imaging of the chest was performed following the standard protocol without IV contrast.  COMPARISON:  No prior chest CT.  Chest radiograph from 04/30/2015.  FINDINGS: Mediastinum/Nodes: Normal heart size. No pericardial fluid/thickening. Left anterior descending, left circumflex and right coronary atherosclerosis. There is aneurysmal dilatation of the aortic root and ascending aorta. The aortic root measures approximately 4.9 cm in diameter at the level of the sinuses of Valsalva. The thoracic  aorta measures 4.4 cm in diameter at the sino-tubular junction. The maximum ascending aortic diameter is 4.4 cm. The proximal descending thoracic aorta measures 3.6 cm in diameter. The thoracic aorta measures 2.9 cm diameter at the diaphragmatic hiatus. Normal caliber pulmonary arteries. Normal visualized thyroid. Normal esophagus. No pathologically enlarged axillary, mediastinal or gross hilar lymph nodes, noting limited sensitivity for the detection of hilar adenopathy on this noncontrast study.  Lungs/Pleura: No pneumothorax. No pleural effusion. Mild centrilobular and paraseptal emphysema and mild diffuse bronchial wall thickening. Subpleural 4 mm anterior left lower lobe pulmonary nodule associated with the left major fissure (series 3/ image 33). No acute consolidative airspace disease, additional significant pulmonary nodules or lung masses.  Upper abdomen: Diffuse hepatic steatosis.  Musculoskeletal: No aggressive appearing focal osseous lesions. Mild degenerative changes in the thoracic spine.  IMPRESSION:  1. Aneurysmal dilatation of the aortic root (4.9 cm diameter) and ascending aorta (4.4 cm diameter). Ascending thoracic aortic aneurysm. Recommend semi-annual imaging followup by CTA or MRA. This recommendation follows 2010 ACCF/AHA/AATS/ACR/ASA/SCA/SCAI/SIR/STS/SVM Guidelines for the Diagnosis and Management of Patients With Thoracic Aortic Disease. Circulation. 2010; 121: A213-Y865 2. Three-vessel coronary atherosclerosis. 3. Subpleural left lower lobe 4 mm pulmonary nodule, probably benign subpleural lymph node. A follow-up chest CT is advised in 12 months. This recommendation follows the consensus statement: Guidelines for Management of Small Pulmonary Nodules Detected on CT Scans: A Statement from the [place] as published in Radiology 2005; 237:395-400. 4. Mild centrilobular and paraseptal emphysema and mild diffuse bronchial wall thickening, which  suggests COPD.   Electronically Signed   By: Ilona Sorrel M.D.   On: 05/03/2015 11:03      I have independently reviewed the above radiologic studies.  Recent Lab Findings: Lab Results  Component Value Date   WBC 4.7 11/10/2017   HGB 13.1 11/10/2017   HCT 40.6 11/10/2017   PLT 235 11/10/2017   GLUCOSE 86 11/10/2017   CHOL 130 07/16/2016   TRIG 308 (H) 07/16/2016   HDL 25 (L) 07/16/2016   LDLCALC 43 07/16/2016   ALT 18 11/10/2017   AST 17 11/10/2017   NA 138 11/10/2017   K 5.0 11/10/2017   CL 104 11/10/2017   CREATININE 0.75 (L) 11/10/2017   BUN 19 11/10/2017   CO2 24 11/10/2017   TSH 1.930 02/05/2018   INR 1.01 07/29/2016   HGBA1C 6.7 05/28/2018   Most recent echo is theTEE done at time of surgery 04/2015 LV EF: 55% -   60%  ------------------------------------------------------------------- Study Conclusions  - Left ventricle: Systolic function was normal. The estimated   ejection fraction was in the range of 55% to 60%. Wall motion was   normal; there were no regional wall motion abnormalities. - Aortic valve: There was mild regurgitation, with a single jet   originating from the central coaptation point. - Mitral valve: No evidence of vegetation. - Left atrium: No evidence of thrombus in the atrial cavity or   appendage. - Atrial septum: No defect or patent foramen ovale was identified. - Tricuspid valve: No evidence of vegetation.  Intraoperative transesophageal echocardiography.  Birthdate: Patient birthdate: 11-05-1945.  Age:  Patient is 73 yr old.  Sex: Gender: male.  Study date:  Study date: 05/04/2015. Study time: 08:03 AM.  -------------------------------------------------------------------  ------------------------------------------------------------------- Left ventricle:  Systolic function was normal. The estimated ejection fraction was in the range of 55% to 60%. Wall motion was normal; there were no regional wall motion  abnormalities.  ------------------------------------------------------------------- Aortic valve:   Trileaflet.  Doppler:  There was mild regurgitation, with a single jet originating from the central coaptation point.  ------------------------------------------------------------------- Aorta:  Aortic root: The aortic root was mildly dilated.  ------------------------------------------------------------------- Mitral valve:   Structurally normal valve.   Leaflet separation was normal.  No evidence of vegetation.  Doppler:  There was no regurgitation.  ------------------------------------------------------------------- Left atrium:  The atrium was normal in size.  No evidence of thrombus in the atrial cavity or appendage.  ------------------------------------------------------------------- Atrial septum:  No defect or patent foramen ovale was identified.   ------------------------------------------------------------------- Right ventricle:  The cavity size was normal. Wall thickness was normal. Systolic function was normal.  ------------------------------------------------------------------- Pulmonic valve:   Poorly visualized.  ------------------------------------------------------------------- Tricuspid valve:   Structurally normal valve.   Leaflet separation was normal.  No evidence of vegetation.  Doppler:  There was no regurgitation.  -------------------------------------------------------------------  Pericardium:  The pericardium was normal in appearance. There was no pericardial effusion.  ------------------------------------------------------------------- Pre bypass:  Post bypass: LV global systolic function was unchanged from the prior stage. The estimated LV ejection fraction was 55%. Aortic valve:  Mild regurgitation, with a centrally directed regurgitant jet. Pre bypass: Post  bypass:  ------------------------------------------------------------------- Prepared and Electronically Authenticated by  Suzette Battiest, M.D. 2016-12-14T16:58:28  Assessment / Plan:   Stable CTA of the chest f. Overall stable fusiform aneurysmal dilatation of the ascending thoracic aorta with maximal measurement of 4.5 cm at the level of the sinus of Valsalva. Status post coronary artery bypass grafting December 2016 without recurrent symptoms Plan CTA of the chest 1 year Again reviewed with the patient the need to avoid strenuous lifting, avoid the use of quinolone antibiotics, maintaining good blood pressure control.   [name] MD      [address] [place] [address] Office [phone]   Beeper [phone]  11/08/2018 12:44 PM

## 2018-11-15 ENCOUNTER — Encounter: Payer: Self-pay | Admitting: Family Medicine

## 2018-11-15 ENCOUNTER — Ambulatory Visit (INDEPENDENT_AMBULATORY_CARE_PROVIDER_SITE_OTHER): Payer: Medicare Other | Admitting: Family Medicine

## 2018-11-15 ENCOUNTER — Other Ambulatory Visit: Payer: Self-pay

## 2018-11-15 VITALS — BP 126/80 | HR 69 | Temp 98.4°F | Ht 68.0 in | Wt 181.2 lb

## 2018-11-15 DIAGNOSIS — E1169 Type 2 diabetes mellitus with other specified complication: Secondary | ICD-10-CM | POA: Diagnosis not present

## 2018-11-15 DIAGNOSIS — E1142 Type 2 diabetes mellitus with diabetic polyneuropathy: Secondary | ICD-10-CM | POA: Diagnosis not present

## 2018-11-15 DIAGNOSIS — E119 Type 2 diabetes mellitus without complications: Secondary | ICD-10-CM | POA: Diagnosis not present

## 2018-11-15 DIAGNOSIS — E782 Mixed hyperlipidemia: Secondary | ICD-10-CM

## 2018-11-15 DIAGNOSIS — L989 Disorder of the skin and subcutaneous tissue, unspecified: Secondary | ICD-10-CM | POA: Diagnosis not present

## 2018-11-15 DIAGNOSIS — I10 Essential (primary) hypertension: Secondary | ICD-10-CM

## 2018-11-15 DIAGNOSIS — J439 Emphysema, unspecified: Secondary | ICD-10-CM | POA: Diagnosis not present

## 2018-11-15 LAB — BAYER DCA HB A1C WAIVED: HB A1C (BAYER DCA - WAIVED): 6.5 % (ref <7.0)

## 2018-11-15 NOTE — Progress Notes (Signed)
BP 126/80   Pulse 69   Temp 98.4 F (36.9 C) (Oral)   Ht 5' 8"  (1.727 m)   Wt 181 lb 3.2 oz (82.2 kg)   BMI 27.55 kg/m    Subjective:   Patient ID: Eduardo [name], male    DOB: 1945/12/19, 73 y.o.   MRN: [id number]  HPI: Eduardo [name] is a 73 y.o. male presenting on 11/15/2018 for Diabetes (3 month follow up)   HPI Type 2 diabetes mellitus Patient comes in today for recheck of his diabetes. Patient has been currently taking metformin. Patient is not currently on an ACE inhibitor/ARB. Patient has not seen an ophthalmologist this year. Patient denies any new issues with their feet.  Patient has diabetic neuropathy but says is stable and does not complain of any issues with it currently.  Hypertension Patient is currently on carvedilol, and their blood pressure today is 126/80. Patient denies any lightheadedness or dizziness. Patient denies headaches, blurred vision, chest pains, shortness of breath, or weakness. Denies any side effects from medication and is content with current medication.  Patient has known CAD status post quadruple bypass  Hyperlipidemia Patient is coming in for recheck of his hyperlipidemia. The patient is currently taking Zetia. They deny any issues with myalgias or history of liver damage from it. They deny any focal numbness or weakness or chest pain.   Nonhealing skin lesion Patient has a nonhealing skin lesion on his upper outer left ear that has been there for about 3 years.  His wife says that it will scab up on him and then she will open it up and some hard almost rice shaped and sized nodules will come out of it and then it will scab up again and then that will happen again after a few months.  Relevant past medical, surgical, family and social history reviewed and updated as indicated. Interim medical history since our last visit reviewed. Allergies and medications reviewed and updated.  Review of Systems  Constitutional: Negative for  chills and fever.  Eyes: Negative for visual disturbance.  Respiratory: Negative for shortness of breath and wheezing.   Cardiovascular: Negative for chest pain and leg swelling.  Musculoskeletal: Negative for back pain and gait problem.  Skin: Positive for wound (Nonhealing skin lesion that is currently scabbed over on left upper outer ear that has been there for 3 years). Negative for rash.  All other systems reviewed and are negative.   Per HPI unless specifically indicated above   Allergies as of 11/15/2018      Reactions   Statins Other (See Comments)   Body aches   Benazepril    hyperkalemia   Quinolones    Patient was warned about not using Cipro and similar antibiotics. Recent studies have raised concern that fluoroquinolone antibiotics could be associated with an increased risk of aortic aneurysm Fluoroquinolones have non-antimicrobial properties that might jeopardise the integrity of the extracellular matrix of the vascular wall In a  propensity score matched cohort study in [place], there was a 66% increased rate of aortic aneurysm or dissection associated with oral fluoroquinolone use, compared wit      Medication List       Accurate as of November 15, 2018 11:16 AM. If you have any questions, ask your nurse or doctor.        albuterol 108 (90 Base) MCG/ACT inhaler Commonly known as: VENTOLIN HFA Inhale 1 puff into the lungs every 6 (six) hours as needed for wheezing or  shortness of breath.   aspirin 81 MG EC tablet Take 1 tablet (81 mg total) by mouth daily.   budesonide-formoterol 80-4.5 MCG/ACT inhaler Commonly known as: SYMBICORT Inhale 2 puffs into the lungs 2 (two) times daily.   carvedilol 12.5 MG tablet Commonly known as: COREG Take 12.5 mg by mouth 2 (two) times daily.   cetirizine 10 MG tablet Commonly known as: ZYRTEC Take 1 tablet (10 mg total) by mouth daily.   clopidogrel 75 MG tablet Commonly known as: PLAVIX Take 75 mg by mouth daily.    DULoxetine 60 MG capsule Commonly known as: CYMBALTA Take 90 mg by mouth daily.   ezetimibe 10 MG tablet Commonly known as: ZETIA Take 10 mg by mouth at bedtime.   gemfibrozil 600 MG tablet Commonly known as: LOPID Take 600 mg by mouth 2 (two) times daily before a meal.   metFORMIN 1000 MG tablet Commonly known as: GLUCOPHAGE Take 1,000 mg by mouth 2 (two) times daily with a meal.   nitroGLYCERIN 0.4 MG SL tablet Commonly known as: NITROSTAT Place 0.4 mg under the tongue every 5 (five) minutes as needed for chest pain.   pantoprazole 40 MG tablet Commonly known as: PROTONIX Take 1 tablet (40 mg total) by mouth 2 (two) times daily before a meal. Before breakfast and supper   tamsulosin 0.4 MG Caps capsule Commonly known as: FLOMAX Take 1 capsule (0.4 mg total) by mouth daily.   tetrahydrozoline 0.05 % ophthalmic solution Place 1-2 drops into both eyes 3 (three) times daily as needed (for dry/irritated eyes).   tiotropium 18 MCG inhalation capsule Commonly known as: SPIRIVA Place 18 mcg into inhaler and inhale daily as needed (for breathing).   Vitamin D3 125 MCG (5000 UT) Caps Take 5,000 Units by mouth daily.        Objective:   BP 126/80   Pulse 69   Temp 98.4 F (36.9 C) (Oral)   Ht 5' 8"  (1.727 m)   Wt 181 lb 3.2 oz (82.2 kg)   BMI 27.55 kg/m   Wt Readings from Last 3 Encounters:  11/15/18 181 lb 3.2 oz (82.2 kg)  11/08/18 176 lb (79.8 kg)  05/28/18 178 lb (80.7 kg)    Physical Exam Vitals signs and nursing note reviewed.  Constitutional:      General: He is not in acute distress.    Appearance: He is well-developed. He is not diaphoretic.  Eyes:     General: No scleral icterus.    Conjunctiva/sclera: Conjunctivae normal.  Neck:     Musculoskeletal: Neck supple.     Thyroid: No thyromegaly.  Cardiovascular:     Rate and Rhythm: Normal rate and regular rhythm.     Heart sounds: Normal heart sounds. No murmur.  Pulmonary:     Effort: Pulmonary  effort is normal. No respiratory distress.     Breath sounds: Normal breath sounds. No wheezing.  Musculoskeletal: Normal range of motion.  Lymphadenopathy:     Cervical: No cervical adenopathy.  Skin:    General: Skin is warm and dry.     Findings: Lesion (Nonhealing skin lesion on left upper outer ear.  Scabbed over, 0.1 cm) present. No rash.  Neurological:     Mental Status: He is alert and oriented to person, place, and time.     Coordination: Coordination normal.  Psychiatric:        Behavior: Behavior normal.       Assessment & Plan:   Problem List Items Addressed This  Visit      Cardiovascular and Mediastinum   HTN (hypertension) (Chronic)   Relevant Orders   CMP14+EGFR (Completed)     Respiratory   COPD (chronic obstructive pulmonary disease) (HCC) (Chronic)     Endocrine   Type 2 diabetes mellitus without complication, without long-term current use of insulin (HCC) - Primary (Chronic)     Nervous and Auditory   Diabetic neuropathy (HCC)     Other   Hyperlipidemia (Chronic)   Relevant Orders   Lipid panel (Completed)    Other Visit Diagnoses    Non-healing skin lesion       on left upper outer ear, been there 3 years   Relevant Orders   Ambulatory referral to Dermatology      Continue carvedilol and Cymbalta and Plavix and Zetia and gemfibrozil and metformin.  No changes in medication currently, will check blood work  Follow up plan: Return in about 3 months (around 02/15/2019), or if symptoms worsen or fail to improve, for Diabetes.  Counseling provided for all of the vaccine components No orders of the defined types were placed in this encounter.   Caryl Pina, MD [facility] 11/15/2018, 11:16 AM

## 2018-11-16 LAB — CBC WITH DIFFERENTIAL/PLATELET
Basophils Absolute: 0.1 10*3/uL (ref 0.0–0.2)
Basos: 1 %
EOS (ABSOLUTE): 0.2 10*3/uL (ref 0.0–0.4)
Eos: 3 %
Hematocrit: 39.7 % (ref 37.5–51.0)
Hemoglobin: 12.5 g/dL — ABNORMAL LOW (ref 13.0–17.7)
Immature Grans (Abs): 0 10*3/uL (ref 0.0–0.1)
Immature Granulocytes: 0 %
Lymphocytes Absolute: 1.5 10*3/uL (ref 0.7–3.1)
Lymphs: 29 %
MCH: 24.3 pg — ABNORMAL LOW (ref 26.6–33.0)
MCHC: 31.5 g/dL (ref 31.5–35.7)
MCV: 77 fL — ABNORMAL LOW (ref 79–97)
Monocytes Absolute: 0.5 10*3/uL (ref 0.1–0.9)
Monocytes: 9 %
Neutrophils Absolute: 2.8 10*3/uL (ref 1.4–7.0)
Neutrophils: 58 %
Platelets: 275 10*3/uL (ref 150–450)
RBC: 5.15 x10E6/uL (ref 4.14–5.80)
RDW: 15.7 % — ABNORMAL HIGH (ref 11.6–15.4)
WBC: 4.9 10*3/uL (ref 3.4–10.8)

## 2018-11-16 LAB — LIPID PANEL
Chol/HDL Ratio: 3.7 ratio (ref 0.0–5.0)
Cholesterol, Total: 128 mg/dL (ref 100–199)
HDL: 35 mg/dL — ABNORMAL LOW (ref >39)
LDL Calculated: 61 mg/dL (ref 0–99)
Triglycerides: 158 mg/dL — ABNORMAL HIGH (ref 0–149)
VLDL Cholesterol Cal: 32 mg/dL (ref 5–40)

## 2018-11-16 LAB — CMP14+EGFR
ALT: 15 IU/L (ref 0–44)
AST: 19 IU/L (ref 0–40)
Albumin/Globulin Ratio: 1.7 (ref 1.2–2.2)
Albumin: 4.3 g/dL (ref 3.7–4.7)
Alkaline Phosphatase: 58 IU/L (ref 39–117)
BUN/Creatinine Ratio: 23 (ref 10–24)
BUN: 18 mg/dL (ref 8–27)
Bilirubin Total: 0.2 mg/dL (ref 0.0–1.2)
CO2: 25 mmol/L (ref 20–29)
Calcium: 9.3 mg/dL (ref 8.6–10.2)
Chloride: 100 mmol/L (ref 96–106)
Creatinine, Ser: 0.79 mg/dL (ref 0.76–1.27)
GFR calc Af Amer: 103 mL/min/{1.73_m2} (ref >59)
GFR calc non Af Amer: 89 mL/min/{1.73_m2} (ref >59)
Globulin, Total: 2.5 g/dL (ref 1.5–4.5)
Glucose: 97 mg/dL (ref 65–99)
Potassium: 4.8 mmol/L (ref 3.5–5.2)
Sodium: 143 mmol/L (ref 134–144)
Total Protein: 6.8 g/dL (ref 6.0–8.5)

## 2018-11-27 ENCOUNTER — Other Ambulatory Visit: Payer: Medicare Other

## 2018-11-27 ENCOUNTER — Other Ambulatory Visit: Payer: Self-pay

## 2018-11-27 DIAGNOSIS — Z20822 Contact with and (suspected) exposure to covid-19: Secondary | ICD-10-CM

## 2018-11-27 DIAGNOSIS — R6889 Other general symptoms and signs: Secondary | ICD-10-CM | POA: Diagnosis not present

## 2018-11-29 ENCOUNTER — Other Ambulatory Visit: Payer: Self-pay

## 2018-11-29 ENCOUNTER — Ambulatory Visit (INDEPENDENT_AMBULATORY_CARE_PROVIDER_SITE_OTHER): Payer: Medicare Other | Admitting: Family Medicine

## 2018-11-29 ENCOUNTER — Encounter: Payer: Self-pay | Admitting: Family Medicine

## 2018-11-29 VITALS — BP 117/63 | HR 62 | Temp 97.0°F | Ht 68.0 in | Wt 176.4 lb

## 2018-11-29 DIAGNOSIS — R300 Dysuria: Secondary | ICD-10-CM | POA: Diagnosis not present

## 2018-11-29 DIAGNOSIS — N3000 Acute cystitis without hematuria: Secondary | ICD-10-CM | POA: Diagnosis not present

## 2018-11-29 LAB — URINALYSIS, COMPLETE
Bilirubin, UA: NEGATIVE
Glucose, UA: NEGATIVE
Ketones, UA: NEGATIVE
Nitrite, UA: NEGATIVE
Protein,UA: NEGATIVE
RBC, UA: NEGATIVE
Specific Gravity, UA: 1.025 (ref 1.005–1.030)
Urobilinogen, Ur: 2 mg/dL — ABNORMAL HIGH (ref 0.2–1.0)
pH, UA: 6 (ref 5.0–7.5)

## 2018-11-29 LAB — MICROSCOPIC EXAMINATION
Bacteria, UA: NONE SEEN
RBC: NONE SEEN /hpf (ref 0–2)
Renal Epithel, UA: NONE SEEN /hpf

## 2018-11-29 MED ORDER — CEPHALEXIN 500 MG PO CAPS: 500.0000 mg | ORAL_CAPSULE | Freq: Four times a day (QID) | ORAL | 0 refills | Status: DC

## 2018-11-29 NOTE — Progress Notes (Signed)
BP 117/63   Pulse 62   Temp (!) 97 F (36.1 C) (Oral)   Ht 5\' 8"  (1.727 m)   Wt 176 lb 6.4 oz (80 kg)   BMI 26.82 kg/m    Subjective:   Patient ID: Eduardo [name], male    DOB: 21-Oct-1945, 73 y.o.   MRN: [id number]  HPI: Eduardo [name] is a 73 y.o. male presenting on 11/29/2018 for Dysuria (x 2 days)   HPI Patient comes in complaining of dysuria this been going on for the past 2 days.  He says he is been having frequency but he always has frequency.  He does have some difficulty starting his stream and feels like he does not empty his bladder completely.  He says he saw urologist last year.  He denies any fevers or chills or flank pain.  He does have lower abdominal pain but he always has lower abdominal pain since last year.  Relevant past medical, surgical, family and social history reviewed and updated as indicated. Interim medical history since our last visit reviewed. Allergies and medications reviewed and updated.  Review of Systems  Constitutional: Negative for chills and fever.  Respiratory: Negative for shortness of breath and wheezing.   Cardiovascular: Negative for chest pain and leg swelling.  Gastrointestinal: Positive for abdominal pain.  Genitourinary: Positive for difficulty urinating, dysuria, frequency and urgency. Negative for decreased urine volume and hematuria.  Skin: Negative for rash.  All other systems reviewed and are negative.   Per HPI unless specifically indicated above   Allergies as of 11/29/2018      Reactions   Statins Other (See Comments)   Body aches   Benazepril    hyperkalemia   Quinolones    Patient was warned about not using Cipro and similar antibiotics. Recent studies have raised concern that fluoroquinolone antibiotics could be associated with an increased risk of aortic aneurysm Fluoroquinolones have non-antimicrobial properties that might jeopardise the integrity of the extracellular matrix of the vascular wall In  a  propensity score matched cohort study in [place], there was a 66% increased rate of aortic aneurysm or dissection associated with oral fluoroquinolone use, compared wit      Medication List       Accurate as of November 29, 2018 10:10 AM. If you have any questions, ask your nurse or doctor.        albuterol 108 (90 Base) MCG/ACT inhaler Commonly known as: VENTOLIN HFA Inhale 1 puff into the lungs every 6 (six) hours as needed for wheezing or shortness of breath.   aspirin 81 MG EC tablet Take 1 tablet (81 mg total) by mouth daily.   budesonide-formoterol 80-4.5 MCG/ACT inhaler Commonly known as: SYMBICORT Inhale 2 puffs into the lungs 2 (two) times daily.   carvedilol 12.5 MG tablet Commonly known as: COREG Take 12.5 mg by mouth 2 (two) times daily.   cephALEXin 500 MG capsule Commonly known as: KEFLEX Take 1 capsule (500 mg total) by mouth 4 (four) times daily. Started by: Worthy Rancher, MD   cetirizine 10 MG tablet Commonly known as: ZYRTEC Take 1 tablet (10 mg total) by mouth daily.   clopidogrel 75 MG tablet Commonly known as: PLAVIX Take 75 mg by mouth daily.   DULoxetine 60 MG capsule Commonly known as: CYMBALTA Take 90 mg by mouth daily.   ezetimibe 10 MG tablet Commonly known as: ZETIA Take 10 mg by mouth at bedtime.   gemfibrozil 600 MG tablet Commonly known  as: LOPID Take 600 mg by mouth 2 (two) times daily before a meal.   metFORMIN 1000 MG tablet Commonly known as: GLUCOPHAGE Take 1,000 mg by mouth 2 (two) times daily with a meal.   nitroGLYCERIN 0.4 MG SL tablet Commonly known as: NITROSTAT Place 0.4 mg under the tongue every 5 (five) minutes as needed for chest pain.   pantoprazole 40 MG tablet Commonly known as: PROTONIX Take 1 tablet (40 mg total) by mouth 2 (two) times daily before a meal. Before breakfast and supper   tamsulosin 0.4 MG Caps capsule Commonly known as: FLOMAX Take 1 capsule (0.4 mg total) by mouth daily.    tetrahydrozoline 0.05 % ophthalmic solution Place 1-2 drops into both eyes 3 (three) times daily as needed (for dry/irritated eyes).   tiotropium 18 MCG inhalation capsule Commonly known as: SPIRIVA Place 18 mcg into inhaler and inhale daily as needed (for breathing).   Vitamin D3 125 MCG (5000 UT) Caps Take 5,000 Units by mouth daily.        Objective:   BP 117/63   Pulse 62   Temp (!) 97 F (36.1 C) (Oral)   Ht 5\' 8"  (1.727 m)   Wt 176 lb 6.4 oz (80 kg)   BMI 26.82 kg/m   Wt Readings from Last 3 Encounters:  11/29/18 176 lb 6.4 oz (80 kg)  11/15/18 181 lb 3.2 oz (82.2 kg)  11/08/18 176 lb (79.8 kg)    Physical Exam Vitals signs and nursing note reviewed.  Constitutional:      General: He is not in acute distress.    Appearance: He is well-developed. He is not diaphoretic.  Eyes:     General: No scleral icterus.    Conjunctiva/sclera: Conjunctivae normal.  Neck:     Thyroid: No thyromegaly.  Cardiovascular:     Rate and Rhythm: Normal rate and regular rhythm.     Heart sounds: Normal heart sounds. No murmur.  Pulmonary:     Effort: Pulmonary effort is normal. No respiratory distress.     Breath sounds: Normal breath sounds. No wheezing.  Abdominal:     Tenderness: There is abdominal tenderness (Lower abdominal tenderness). There is no right CVA tenderness, left CVA tenderness or rebound.  Musculoskeletal: Normal range of motion.  Skin:    General: Skin is warm and dry.     Findings: No rash.  Neurological:     Mental Status: He is alert and oriented to person, place, and time.     Coordination: Coordination normal.  Psychiatric:        Behavior: Behavior normal.     Urinalysis: 6-10 WBCs, 0-10 epithelial cells, trace leukocytes  Assessment & Plan:   Problem List Items Addressed This Visit    None    Visit Diagnoses    Acute cystitis without hematuria    -  Primary   Relevant Medications   cephALEXin (KEFLEX) 500 MG capsule   Other Relevant  Orders   Urinalysis, Complete   Ambulatory referral to Urology      Will treat for UTI but it sounds like he needs to go see a urologist, there may be some concern about him retaining or having bladder retention. Follow up plan: Return if symptoms worsen or fail to improve.  Counseling provided for all of the vaccine components Orders Placed This Encounter  Procedures  . Urinalysis, Complete  . Ambulatory referral to Urology    Caryl Pina, MD [facility] 11/29/2018, 10:10 AM

## 2018-12-04 LAB — NOVEL CORONAVIRUS, NAA: SARS-CoV-2, NAA: NOT DETECTED

## 2019-01-08 ENCOUNTER — Other Ambulatory Visit: Payer: Self-pay

## 2019-01-08 ENCOUNTER — Ambulatory Visit (INDEPENDENT_AMBULATORY_CARE_PROVIDER_SITE_OTHER): Payer: Medicare Other | Admitting: Student

## 2019-01-08 ENCOUNTER — Encounter: Payer: Self-pay | Admitting: Student

## 2019-01-08 VITALS — BP 126/83 | HR 75 | Temp 97.8°F | Ht 68.0 in | Wt 179.0 lb

## 2019-01-08 DIAGNOSIS — Z951 Presence of aortocoronary bypass graft: Secondary | ICD-10-CM | POA: Diagnosis not present

## 2019-01-08 DIAGNOSIS — I2510 Atherosclerotic heart disease of native coronary artery without angina pectoris: Secondary | ICD-10-CM

## 2019-01-08 DIAGNOSIS — I10 Essential (primary) hypertension: Secondary | ICD-10-CM | POA: Diagnosis not present

## 2019-01-08 DIAGNOSIS — I7121 Aneurysm of the ascending aorta, without rupture: Secondary | ICD-10-CM

## 2019-01-08 DIAGNOSIS — I712 Thoracic aortic aneurysm, without rupture: Secondary | ICD-10-CM

## 2019-01-08 DIAGNOSIS — E785 Hyperlipidemia, unspecified: Secondary | ICD-10-CM

## 2019-01-08 NOTE — Progress Notes (Signed)
Cardiology Office Note    Date:  01/08/2019   ID:  Eduardo [name], DOB [birth year], MRN [id number]  PCP:  Dettinger, [name], MD  Cardiologist: [name], MD    Chief Complaint  Patient presents with  . Follow-up    Annual Visit    History of Present Illness:    Eduardo [name] is a 73 y.o. male with past medical history of CAD (s/p CABG in 2016 with LIMA-LAD, reverse SVG-D2, reverse SVG-OM1, and reverse SVG-PDA), HTN, HLD, Type 2 DM, and thoracic aortic aneurysm who presents to the office today for annual follow-up.  He was last examined by myself in 03/2018 and denied any recent chest pain or dyspnea on exertion. He did report having chronic abdominal pain which had been unrelated to food consumption. He had been evaluated by GI along with general surgery but was never given a definitive diagnosis. Was continued on his current medication regimen including ASA, Plavix, and beta-blocker therapy. He had previously been intolerant to statins and remained on Zetia.   He did follow-up with Dr. [name] on 11/08/2018 and underwent repeat CTA which showed a stable fusiform aneurysmal dilatation of the ascending thoracic aorta measuring 4.5 cm at the level of the sinus of Valsalva. Continued annual imaging was recommended.   In talking with the patient today, he reports overall doing well from a cardiac perspective since his last office visit. He has baseline dyspnea which occurs when walking up inclines but denies any recent change in this. No recent chest pain or palpitations. He denies any orthopnea, PND, or lower extremity edema. Weight has overall been stable on his home scales.  He was previously having issues with urinary retention but says [facility]. Since restarting this, his urinary symptoms have resolved.   Past Medical History:  Diagnosis Date  . Arthritis   . Bilateral carotid artery disease ([place])  04/28/2015   1-39 percent bilateral stenosis noted on Doppler   . CAD (coronary artery disease)    a. s/p CABG in 2016 with LIMA-LAD, reverse SVG-D2, reverse SVG-OM1, and reverse SVG-PDA  . Cervical disc disease   . Chemical exposure    agent orange   . Colon polyps   . COPD (chronic obstructive pulmonary disease) ([place])   . Diabetes mellitus without complication ([place])   . Diabetic neuropathy ([place]) 03/07/2018  . Diverticulitis   . Gallstones   . GERD (gastroesophageal reflux disease)   . Hyperlipidemia   . Hypertension   . Lumbar disc disease   . Multiple gastric ulcers   . Obesity (BMI 30-39.9)   . Peripheral neuropathy    in all extremities  . PTSD (post-traumatic stress disorder)   . Sleep apnea    can't wear cpap  . Thoracic ascending aortic aneurysm [facility])     Past Surgical History:  Procedure Laterality Date  . ANAL RECTAL MANOMETRY N/A 02/05/2018   Procedure: ANO RECTAL MANOMETRY;  Surgeon: [name], MD;  Location: [facility];  Service: Endoscopy;  Laterality: N/A;  . CARDIAC CATHETERIZATION    . CARDIAC CATHETERIZATION  03/18/14   difficult to determine culprit vesel  . CARDIAC CATHETERIZATION N/A 04/29/2015   Procedure: Left Heart Cath and Coronary Angiography;  Surgeon: [name], MD;  Location: [place] CV LAB;  Service: Cardiovascular;  Laterality: N/A;  . CARPOMETACARPAL ([place]) FUSION OF THUMB Right 08/04/2016   Procedure: CARPOMETACARPAL ([place]) FUSION OF THUMB;  Surgeon: Iran Planas,  MD;  Location: [place];  Service: Orthopedics;  Laterality: Right;  . CERVICAL SPINE SURGERY    . COLONOSCOPY N/A 04/05/2017   Procedure: COLONOSCOPY;  Surgeon: Rogene Houston, MD;  [facility];  Service: Endoscopy;  Laterality: N/A;  830  . CORONARY ARTERY BYPASS GRAFT N/A 05/04/2015   Procedure: CORONARY ARTERY BYPASS GRAFTING (CABG)  x four,  using left internal mammary artery, and bilateral thigh greater saphenous veins;  Surgeon: [name],  MD;  Location: [place];  Service: Open Heart Surgery;  Laterality: N/A;  . EYE SURGERY Bilateral    cataract  . FOOT SURGERY Left    "little toe"  . I&D EXTREMITY Left 05/30/2013   Procedure: IRRIGATION AND DEBRIDEMENT Left Hand and Foreign body removal;  Surgeon: [name], MD;  Location: [place];  Service: Orthopedics;  Laterality: Left;  . LEFT HEART CATHETERIZATION WITH CORONARY ANGIOGRAM N/A 03/18/2014   Procedure: LEFT HEART CATHETERIZATION WITH CORONARY ANGIOGRAM;  Surgeon: [name], MD;  Location: [facility] CATH LAB;  Service: Cardiovascular;  Laterality: N/A;  . LUMBAR LAMINECTOMY    . PARTIAL COLECTOMY     For diverticulitis  . PTCA     unsuccesful  . REPLACEMENT TOTAL KNEE     right   . SHOULDER SURGERY Bilateral    x 2 (on both shoulder)_  . TEE WITHOUT CARDIOVERSION N/A 05/04/2015   Procedure: TRANSESOPHAGEAL ECHOCARDIOGRAM (TEE);  Surgeon: [name], MD;  Location: [place];  Service: Open Heart Surgery;  Laterality: N/A;  . thumb surgery Left     Current Medications: Outpatient Medications Prior to Visit  Medication Sig Dispense Refill  . albuterol (PROVENTIL HFA;VENTOLIN HFA) 108 (90 BASE) MCG/ACT inhaler Inhale 1 puff into the lungs every 6 (six) hours as needed for wheezing or shortness of breath.     [name] aspirin EC 81 MG EC tablet Take 1 tablet (81 mg total) by mouth daily.    . budesonide-formoterol (SYMBICORT) 80-4.5 MCG/ACT inhaler Inhale 2 puffs into the lungs 2 (two) times daily. 1 Inhaler 3  . carvedilol (COREG) 12.5 MG tablet Take 12.5 mg by mouth 2 (two) times daily.     . cephALEXin (KEFLEX) 500 MG capsule Take 1 capsule (500 mg total) by mouth 4 (four) times daily. 28 capsule 0  . cetirizine (ZYRTEC) 10 MG tablet Take 1 tablet (10 mg total) by mouth daily. 30 tablet 11  . Cholecalciferol (VITAMIN D3) 5000 units CAPS Take 5,000 Units by mouth daily.    . clopidogrel (PLAVIX) 75 MG tablet Take 75 mg by mouth daily.    . DULoxetine (CYMBALTA) 60 MG  capsule Take 90 mg by mouth daily.    [name] ezetimibe (ZETIA) 10 MG tablet Take 10 mg by mouth at bedtime.     [name] gemfibrozil (LOPID) 600 MG tablet Take 600 mg by mouth 2 (two) times daily before a meal.    . metFORMIN (GLUCOPHAGE) 1000 MG tablet Take 1,000 mg by mouth 2 (two) times daily with a meal.    . nitroGLYCERIN (NITROSTAT) 0.4 MG SL tablet Place 0.4 mg under the tongue every 5 (five) minutes as needed for chest pain.    . pantoprazole (PROTONIX) 40 MG tablet Take 1 tablet (40 mg total) by mouth 2 (two) times daily before a meal. Before breakfast and supper 180 tablet 0  . tamsulosin (FLOMAX) 0.4 MG CAPS capsule Take 1 capsule (0.4 mg total) by mouth daily. 90 capsule 3  . tetrahydrozoline 0.05 %  ophthalmic solution Place 1-2 drops into both eyes 3 (three) times daily as needed (for dry/irritated eyes).    [name] tiotropium (SPIRIVA) 18 MCG inhalation capsule Place 18 mcg into inhaler and inhale daily as needed (for breathing).     No facility-administered medications prior to visit.      Allergies:   Statins, Benazepril, and Quinolones   Social History   Socioeconomic History  . Marital status: Married    Spouse name: Flo  . Number of children: 6  . Years of education: Not on file  . Highest education level: 12th grade  Occupational History  . Occupation: retired  Scientific laboratory technician  . Financial resource strain: Not on file  . Food insecurity    Worry: Not on file    Inability: Not on file  . Transportation needs    Medical: Not on file    Non-medical: Not on file  Tobacco Use  . Smoking status: Former Smoker    Packs/day: 0.75    Years: 30.00    Pack years: 22.50    Types: Cigarettes    Start date: 1962    Quit date: 05/31/1987    Years since quitting: 31.6  . Smokeless tobacco: Never Used  Substance and Sexual Activity  . Alcohol use: No    Alcohol/week: 0.0 standard drinks  . Drug use: No  . Sexual activity: Yes  Lifestyle  . Physical activity    Days per week: Not on file     Minutes per session: Not on file  . Stress: Not on file  Relationships  . Social connections    Talks on phone: More than three times a week    Gets together: More than three times a week    Attends religious service: More than 4 times per year    Active member of club or organization: Yes    Attends meetings of clubs or organizations: More than 4 times per year    Relationship status: Married  Other Topics Concern  . Not on file  Social History Narrative   Patient is married.  5 sons one daughter.  They are scattered throughout the country.  17 grandchildren.   He is a [place] of 23 years service and was in the [employer]   After the [employer] he ran a [employer].      For habits he quit smoking in 1989.  3 caffeinated beverages a day no alcohol or drug use.  No smokeless tobacco.   Left handed (states he can use both if needed)   Caffeine 3 cups per day       Family History:  The patient's family history includes Diabetes in his father, paternal grandmother, and paternal uncle; Drug abuse in his son; Heart disease in his father, mother, paternal grandmother, paternal uncle, paternal uncle, and paternal uncle; Hypertension in his father, mother, paternal grandmother, and paternal uncle.   Review of Systems:   Please see the history of present illness.     General:  No chills, fever, night sweats or weight changes.  Cardiovascular:  No chest pain, edema, orthopnea, palpitations, paroxysmal nocturnal dyspnea. Positive for dyspnea on exertion.  Dermatological: No rash, lesions/masses Respiratory: No cough, dyspnea Urologic: No hematuria, Positive for dysuria (now resolved).  Abdominal:   No nausea, vomiting, diarrhea, bright red blood per rectum, melena, or hematemesis Neurologic:  No visual changes, wkns, changes in mental status. All other systems reviewed and are otherwise negative except as noted above.  Physical Exam:    VS:  BP 126/83   Pulse 75   Temp 97.8  F (36.6 C)   Ht 5\' 8"  (1.727 m)   Wt 179 lb (81.2 kg)   SpO2 98%   BMI 27.22 kg/m    General: Well developed, well nourished,male appearing in no acute distress. Head: Normocephalic, atraumatic, sclera non-icteric, no xanthomas, nares are without discharge.  Neck: No carotid bruits. JVD not elevated.  Lungs: Respirations regular and unlabored, without wheezes or rales.  Heart: Regular rate and rhythm. No S3 or S4.  No murmur, no rubs, or gallops appreciated. Abdomen: Soft, non-tender, non-distended with normoactive bowel sounds. No hepatomegaly. No rebound/guarding. No obvious abdominal masses. Msk:  Strength and tone appear normal for age. No joint deformities or effusions. Extremities: No clubbing or cyanosis. No lower extremity edema.  Distal pedal pulses are 2+ bilaterally. Neuro: Alert and oriented X 3. Moves all extremities spontaneously. No focal deficits noted. Psych:  Responds to questions appropriately with a normal affect. Skin: No rashes or lesions noted  Wt Readings from Last 3 Encounters:  01/08/19 179 lb (81.2 kg)  11/29/18 176 lb 6.4 oz (80 kg)  11/15/18 181 lb 3.2 oz (82.2 kg)     Studies/Labs Reviewed:   EKG:  EKG is ordered today.  The ekg ordered today demonstrates NSR, HR 70, with no acute ST changes when compared to prior tracings.   Recent Labs: 02/05/2018: TSH 1.930 11/15/2018: ALT 15; BUN 18; Creatinine, Ser 0.79; Hemoglobin 12.5; Platelets 275; Potassium 4.8; Sodium 143   Lipid Panel    Component Value Date/Time   CHOL 128 11/15/2018 1159   TRIG 158 (H) 11/15/2018 1159   HDL 35 (L) 11/15/2018 1159   CHOLHDL 3.7 11/15/2018 1159   CHOLHDL 5.2 07/16/2016 0701   VLDL 62 (H) 07/16/2016 0701   LDLCALC 61 11/15/2018 1159    Additional studies/ records that were reviewed today include:   Cardiac Catheterization: 03/2015  Prox RCA lesion, 50% stenosed, heavily calcified and eccentric.  Mid RCA lesion, 70% stenosed, heavily calcified and eccentric.   Mid Cx lesion at ostium of OM2, 75% stenosed- seen best in the cranial view and noted on 2015 cath.  Mid LAD lesion, 80% stenosed- progressed since the 2015 cath.  Ost 2nd Diag to 2nd Diag lesion, 60% stenosed.  The left ventricular systolic function is normal.   Discussed with the inpatient team (Dr. Martinique).  Patient with thoracic aneurysm of unknown size.  Given how difficult RCA PCI would be due to calcification and tortuosity, will get cardiac surgery evaluation.    Stop clopidogrel at this time.  Resume heparin 8 hours post sheath pull.  CTA: 10/2018 IMPRESSION: 1. Overall stable fusiform aneurysmal dilatation of the ascending thoracic aorta with maximal measurement of 4.5 cm at the level of the sinus of Valsalva. No aortic dissection. Recommend continued surveillance. 2. Stable surgical changes from coronary artery bypass surgery. 3. No mediastinal or hilar mass or adenopathy. 4. Emphysematous changes but no acute pulmonary findings or worrisome pulmonary lesions. 5. Stable extensive three-vessel coronary artery calcifications.  Assessment:    1. Coronary artery disease involving native coronary artery of native heart without angina pectoris   2. S/P CABG x 4   3. Essential hypertension   4. Hyperlipidemia LDL goal <70   5. Thoracic ascending aortic aneurysm ([place])      Plan:   In order of problems listed above:  1. CAD - s/p CABG in 2016 with LIMA-LAD, reverse  SVG-D2, reverse SVG-OM1, and reverse SVG-PDA. He has baseline dyspnea which occurs with walking up inclines but denies any recent change in his symptoms and this has been stable for years per his report. No associated chest discomfort. EKG today shows no acute ST changes.  - Continue current medication regimen including ASA, Plavix, Coreg and Zetia.  2. HTN - BP is well controlled at 126/83 during today's visit. Continue Coreg 12.5 mg twice daily.   3. HLD - Followed by his PCP. FLP in 10/2018 showed total  cholesterol 128, triglycerides 158, HDL 35, and LDL 61. At goal of LDL less than 70. He has been intolerant to statin therapy and remains on Zetia 10 mg daily and Gemfibrozil 600 mg twice daily.  4. Thoracic Aortic Aneurysm - followed by Vascular Surgery. Recent CTA showed a stable fusiform aneurysmal dilatation of the ascending thoracic aorta measuring 4.5 cm at the level of the sinus of Valsalva.  - continue with annual imaging.    Medication Adjustments/Labs and Tests Ordered: Current medicines are reviewed at length with the patient today.  Concerns regarding medicines are outlined above.  Medication changes, Labs and Tests ordered today are listed in the Patient Instructions below. Patient Instructions  Medication Instructions:  Your physician recommends that you continue on your current medications as directed. Please refer to the Current Medication list given to you today.  If you need a refill on your cardiac medications before your next appointment, please call your pharmacy.   Lab work: NONE   If you have labs (blood work) drawn today and your tests are completely normal, you will receive your results only by: [name] MyChart Message (if you have MyChart) OR . A paper copy in the mail If you have any lab test that is abnormal or we need to change your treatment, we will call you to review the results.  Testing/Procedures: NONE   Follow-Up: At [facility], you and your health needs are our priority.  As part of our continuing mission to provide you with exceptional heart care, we have created designated Provider Care Teams.  These Care Teams include your primary Cardiologist (physician) and Advanced Practice Providers (APPs -  Physician Assistants and Nurse Practitioners) who all work together to provide you with the care you need, when you need it. You will need a follow up appointment in 6 months.  Please call our office 2 months in advance to schedule this appointment.  You may  see [name], MD or one of the following Advanced Practice Providers on your designated Care Team:   Bernerd Pho, PA-C [facility]) . Ermalinda Barrios, PA-C ([place])  Any Other Special Instructions Will Be Listed Below (If Applicable). Thank you for choosing [facility]!     Signed, Erma Heritage, PA-C  01/08/2019 4:53 PM    [facility] [address] [place], [place] [address] Phone: [phone] Fax: [phone]

## 2019-01-08 NOTE — Patient Instructions (Signed)
Medication Instructions:  Your physician recommends that you continue on your current medications as directed. Please refer to the Current Medication list given to you today.  If you need a refill on your cardiac medications before your next appointment, please call your pharmacy.   Lab work: NONE   If you have labs (blood work) drawn today and your tests are completely normal, you will receive your results only by: . MyChart Message (if you have MyChart) OR . A paper copy in the mail If you have any lab test that is abnormal or we need to change your treatment, we will call you to review the results.  Testing/Procedures: NONE   Follow-Up: At [facility], you and your health needs are our priority.  As part of our continuing mission to provide you with exceptional heart care, we have created designated Provider Care Teams.  These Care Teams include your primary Cardiologist (physician) and Advanced Practice Providers (APPs -  Physician Assistants and Nurse Practitioners) who all work together to provide you with the care you need, when you need it. You will need a follow up appointment in 6 months.  Please call our office 2 months in advance to schedule this appointment.  You may see Suresh Koneswaran, MD or one of the following Advanced Practice Providers on your designated Care Team:   Brittany Strader, PA-C ([place] Office) . Michele Lenze, PA-C ([place] Office)  Any Other Special Instructions Will Be Listed Below (If Applicable). Thank you for choosing [facility]!     

## 2019-01-28 DIAGNOSIS — D229 Melanocytic nevi, unspecified: Secondary | ICD-10-CM | POA: Diagnosis not present

## 2019-01-28 DIAGNOSIS — L570 Actinic keratosis: Secondary | ICD-10-CM | POA: Diagnosis not present

## 2019-01-28 DIAGNOSIS — L821 Other seborrheic keratosis: Secondary | ICD-10-CM | POA: Diagnosis not present

## 2019-02-08 DIAGNOSIS — N3281 Overactive bladder: Secondary | ICD-10-CM | POA: Diagnosis not present

## 2019-02-08 DIAGNOSIS — R3914 Feeling of incomplete bladder emptying: Secondary | ICD-10-CM | POA: Diagnosis not present

## 2019-02-08 DIAGNOSIS — N401 Enlarged prostate with lower urinary tract symptoms: Secondary | ICD-10-CM | POA: Diagnosis not present

## 2019-02-10 ENCOUNTER — Emergency Department (HOSPITAL_COMMUNITY): Payer: Medicare Other

## 2019-02-10 ENCOUNTER — Emergency Department (HOSPITAL_COMMUNITY)
Admission: EM | Admit: 2019-02-10 | Discharge: 2019-02-10 | Disposition: A | Discharge status: Home / Self Care | Payer: Medicare Other | Attending: Emergency Medicine | Admitting: Emergency Medicine

## 2019-02-10 ENCOUNTER — Encounter (HOSPITAL_COMMUNITY): Payer: Self-pay | Admitting: Emergency Medicine

## 2019-02-10 ENCOUNTER — Other Ambulatory Visit: Payer: Self-pay

## 2019-02-10 DIAGNOSIS — M25521 Pain in right elbow: Secondary | ICD-10-CM | POA: Insufficient documentation

## 2019-02-10 DIAGNOSIS — Z20828 Contact with and (suspected) exposure to other viral communicable diseases: Secondary | ICD-10-CM | POA: Insufficient documentation

## 2019-02-10 DIAGNOSIS — I10 Essential (primary) hypertension: Secondary | ICD-10-CM | POA: Diagnosis not present

## 2019-02-10 DIAGNOSIS — M19021 Primary osteoarthritis, right elbow: Secondary | ICD-10-CM | POA: Diagnosis not present

## 2019-02-10 DIAGNOSIS — R079 Chest pain, unspecified: Secondary | ICD-10-CM | POA: Diagnosis present

## 2019-02-10 DIAGNOSIS — R0789 Other chest pain: Secondary | ICD-10-CM | POA: Insufficient documentation

## 2019-02-10 DIAGNOSIS — I700 Atherosclerosis of aorta: Secondary | ICD-10-CM | POA: Diagnosis not present

## 2019-02-10 DIAGNOSIS — E1140 Type 2 diabetes mellitus with diabetic neuropathy, unspecified: Secondary | ICD-10-CM | POA: Diagnosis not present

## 2019-02-10 DIAGNOSIS — Z7901 Long term (current) use of anticoagulants: Secondary | ICD-10-CM | POA: Insufficient documentation

## 2019-02-10 DIAGNOSIS — J441 Chronic obstructive pulmonary disease with (acute) exacerbation: Secondary | ICD-10-CM | POA: Diagnosis not present

## 2019-02-10 DIAGNOSIS — Z7984 Long term (current) use of oral hypoglycemic drugs: Secondary | ICD-10-CM | POA: Diagnosis not present

## 2019-02-10 DIAGNOSIS — Z87891 Personal history of nicotine dependence: Secondary | ICD-10-CM | POA: Insufficient documentation

## 2019-02-10 DIAGNOSIS — Z96651 Presence of right artificial knee joint: Secondary | ICD-10-CM | POA: Diagnosis not present

## 2019-02-10 DIAGNOSIS — Z951 Presence of aortocoronary bypass graft: Secondary | ICD-10-CM | POA: Insufficient documentation

## 2019-02-10 DIAGNOSIS — Z79899 Other long term (current) drug therapy: Secondary | ICD-10-CM | POA: Diagnosis not present

## 2019-02-10 DIAGNOSIS — R05 Cough: Secondary | ICD-10-CM

## 2019-02-10 DIAGNOSIS — R059 Cough, unspecified: Secondary | ICD-10-CM

## 2019-02-10 LAB — CBC
HCT: 41 % (ref 39.0–52.0)
Hemoglobin: 12.2 g/dL — ABNORMAL LOW (ref 13.0–17.0)
MCH: 24.5 pg — ABNORMAL LOW (ref 26.0–34.0)
MCHC: 29.8 g/dL — ABNORMAL LOW (ref 30.0–36.0)
MCV: 82.5 fL (ref 80.0–100.0)
Platelets: 271 10*3/uL (ref 150–400)
RBC: 4.97 MIL/uL (ref 4.22–5.81)
RDW: 15.5 % (ref 11.5–15.5)
WBC: 8.6 10*3/uL (ref 4.0–10.5)
nRBC: 0 % (ref 0.0–0.2)

## 2019-02-10 LAB — BASIC METABOLIC PANEL
Anion gap: 11 (ref 5–15)
BUN: 24 mg/dL — ABNORMAL HIGH (ref 8–23)
CO2: 23 mmol/L (ref 22–32)
Calcium: 9 mg/dL (ref 8.9–10.3)
Chloride: 102 mmol/L (ref 98–111)
Creatinine, Ser: 0.81 mg/dL (ref 0.61–1.24)
GFR calc Af Amer: >60 mL/min (ref >60)
GFR calc non Af Amer: >60 mL/min (ref >60)
Glucose, Bld: 127 mg/dL — ABNORMAL HIGH (ref 70–99)
Potassium: 4.1 mmol/L (ref 3.5–5.1)
Sodium: 136 mmol/L (ref 135–145)

## 2019-02-10 LAB — TROPONIN I (HIGH SENSITIVITY)
Troponin I (High Sensitivity): 5 ng/L (ref <18)
Troponin I (High Sensitivity): 5 ng/L (ref <18)

## 2019-02-10 LAB — SARS CORONAVIRUS 2 BY RT PCR (HOSPITAL ORDER, PERFORMED IN ~~LOC~~ HOSPITAL LAB): SARS Coronavirus 2: NEGATIVE

## 2019-02-10 MED ORDER — PREDNISONE 20 MG PO TABS: 40.0000 mg | ORAL_TABLET | Freq: Every day | ORAL | 0 refills | Status: DC

## 2019-02-10 MED ORDER — DOXYCYCLINE HYCLATE 100 MG PO TABS: 100.0000 mg | ORAL_TABLET | Freq: Two times a day (BID) | ORAL | 0 refills | Status: DC

## 2019-02-10 MED ORDER — PREDNISONE 50 MG PO TABS
60.0000 mg | ORAL_TABLET | Freq: Once | ORAL | Status: AC
  Administered 2019-02-10: 12:00:00 60 mg via ORAL
  Filled 2019-02-10: qty 1

## 2019-02-10 MED ORDER — ALBUTEROL SULFATE (2.5 MG/3ML) 0.083% IN NEBU: 2.5000 mg | INHALATION_SOLUTION | RESPIRATORY_TRACT | 0 refills | Status: AC | PRN

## 2019-02-10 MED ORDER — ALBUTEROL SULFATE HFA 108 (90 BASE) MCG/ACT IN AERS: 2.0000 | INHALATION_SPRAY | RESPIRATORY_TRACT | 0 refills | Status: AC | PRN

## 2019-02-10 MED ORDER — IPRATROPIUM-ALBUTEROL 0.5-2.5 (3) MG/3ML IN SOLN
3.0000 mL | Freq: Once | RESPIRATORY_TRACT | Status: AC
  Administered 2019-02-10: 11:00:00 3 mL via RESPIRATORY_TRACT
  Filled 2019-02-10: qty 3

## 2019-02-10 MED ORDER — ALBUTEROL SULFATE (2.5 MG/3ML) 0.083% IN NEBU
2.5000 mg | INHALATION_SOLUTION | Freq: Once | RESPIRATORY_TRACT | Status: AC
  Administered 2019-02-10: 11:00:00 2.5 mg via RESPIRATORY_TRACT
  Filled 2019-02-10: qty 3

## 2019-02-10 NOTE — ED Notes (Signed)
Pt ambulated with no difficulty. O2 Sat levels never fell below 90. Stated he felt fine walking.

## 2019-02-10 NOTE — ED Notes (Signed)
Family at bedside. 

## 2019-02-10 NOTE — ED Notes (Signed)
RT called
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
## 2019-02-10 NOTE — Discharge Instructions (Addendum)
Take the prescriptions as directed.  Use your albuterol inhaler (2 to 4 puffs) or your albuterol nebulizer (1 unit dose) every 4 hours for the next 7 days, then as needed for cough, wheezing, or shortness of breath. Apply moist heat or ice to the area(s) of discomfort, for 15 minutes at a time, several times per day for the next few days.  Do not fall asleep on a heating or ice pack. Wear the elbow splint/sling until you are seen in follow up by the Orthopedic doctor. Call your regular medical doctor on Monday to schedule a follow up appointment in the next 2 days. Call the Orthopedic doctor on Monday to schedule a follow up appointment this week.  Return to the Emergency Department immediately if worsening.
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
## 2019-02-11 ENCOUNTER — Telehealth: Payer: Self-pay | Admitting: Family Medicine

## 2019-02-13 NOTE — Telephone Encounter (Signed)
Eduardo [name] spoke with patient.

## 2019-02-25 ENCOUNTER — Telehealth: Payer: Self-pay | Admitting: Family Medicine
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
## 2019-02-26 ENCOUNTER — Other Ambulatory Visit: Payer: Self-pay

## 2019-02-26 ENCOUNTER — Emergency Department (HOSPITAL_COMMUNITY): Payer: Medicare Other

## 2019-02-26 ENCOUNTER — Emergency Department (HOSPITAL_COMMUNITY)
Admission: EM | Admit: 2019-02-26 | Discharge: 2019-02-26 | Disposition: A | Discharge status: Home / Self Care | Payer: Medicare Other | Attending: Emergency Medicine | Admitting: Emergency Medicine

## 2019-02-26 ENCOUNTER — Encounter: Payer: Self-pay | Admitting: Family Medicine

## 2019-02-26 ENCOUNTER — Encounter (HOSPITAL_COMMUNITY): Payer: Self-pay | Admitting: Emergency Medicine

## 2019-02-26 DIAGNOSIS — Z7982 Long term (current) use of aspirin: Secondary | ICD-10-CM | POA: Insufficient documentation

## 2019-02-26 DIAGNOSIS — Z87891 Personal history of nicotine dependence: Secondary | ICD-10-CM | POA: Diagnosis not present

## 2019-02-26 DIAGNOSIS — Z79899 Other long term (current) drug therapy: Secondary | ICD-10-CM | POA: Insufficient documentation

## 2019-02-26 DIAGNOSIS — E119 Type 2 diabetes mellitus without complications: Secondary | ICD-10-CM | POA: Diagnosis not present

## 2019-02-26 DIAGNOSIS — I10 Essential (primary) hypertension: Secondary | ICD-10-CM | POA: Diagnosis not present

## 2019-02-26 DIAGNOSIS — I259 Chronic ischemic heart disease, unspecified: Secondary | ICD-10-CM | POA: Insufficient documentation

## 2019-02-26 DIAGNOSIS — Z7902 Long term (current) use of antithrombotics/antiplatelets: Secondary | ICD-10-CM | POA: Diagnosis not present

## 2019-02-26 DIAGNOSIS — R0789 Other chest pain: Secondary | ICD-10-CM | POA: Diagnosis not present

## 2019-02-26 DIAGNOSIS — Z7984 Long term (current) use of oral hypoglycemic drugs: Secondary | ICD-10-CM | POA: Diagnosis not present

## 2019-02-26 DIAGNOSIS — Z951 Presence of aortocoronary bypass graft: Secondary | ICD-10-CM | POA: Insufficient documentation

## 2019-02-26 DIAGNOSIS — Z96651 Presence of right artificial knee joint: Secondary | ICD-10-CM | POA: Insufficient documentation

## 2019-02-26 DIAGNOSIS — R079 Chest pain, unspecified: Secondary | ICD-10-CM | POA: Diagnosis not present

## 2019-02-26 DIAGNOSIS — J449 Chronic obstructive pulmonary disease, unspecified: Secondary | ICD-10-CM | POA: Diagnosis not present

## 2019-02-26 LAB — CBC
HCT: 38.8 % — ABNORMAL LOW (ref 39.0–52.0)
Hemoglobin: 12 g/dL — ABNORMAL LOW (ref 13.0–17.0)
MCH: 25.6 pg — ABNORMAL LOW (ref 26.0–34.0)
MCHC: 30.9 g/dL (ref 30.0–36.0)
MCV: 82.7 fL (ref 80.0–100.0)
Platelets: 242 10*3/uL (ref 150–400)
RBC: 4.69 MIL/uL (ref 4.22–5.81)
RDW: 15.1 % (ref 11.5–15.5)
WBC: 5.6 10*3/uL (ref 4.0–10.5)
nRBC: 0 % (ref 0.0–0.2)

## 2019-02-26 LAB — BASIC METABOLIC PANEL
Anion gap: 10 (ref 5–15)
BUN: 20 mg/dL (ref 8–23)
CO2: 26 mmol/L (ref 22–32)
Calcium: 9 mg/dL (ref 8.9–10.3)
Chloride: 103 mmol/L (ref 98–111)
Creatinine, Ser: 0.77 mg/dL (ref 0.61–1.24)
GFR calc Af Amer: >60 mL/min (ref >60)
GFR calc non Af Amer: >60 mL/min (ref >60)
Glucose, Bld: 99 mg/dL (ref 70–99)
Potassium: 4.3 mmol/L (ref 3.5–5.1)
Sodium: 139 mmol/L (ref 135–145)

## 2019-02-26 LAB — TROPONIN I (HIGH SENSITIVITY)
Troponin I (High Sensitivity): 6 ng/L (ref <18)
Troponin I (High Sensitivity): 5 ng/L (ref <18)

## 2019-02-26 MED ORDER — SODIUM CHLORIDE 0.9% FLUSH: 3.0000 mL | Freq: Once | INTRAVENOUS | Status: DC

## 2019-02-26 NOTE — Discharge Instructions (Signed)
As discussed, your evaluation today has been largely reassuring.  But, it is important that you monitor your condition carefully, and do not hesitate to return to the ED if you develop new, or concerning changes in your condition. ? ?Otherwise, please follow-up with your physician for appropriate ongoing care. ? ?
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
## 2019-02-28 NOTE — Telephone Encounter (Signed)
Sent patient a dismissal letter.  Wife was not dismissed at this time because she is not part of this issue right now.

## 2019-03-01 ENCOUNTER — Ambulatory Visit (INDEPENDENT_AMBULATORY_CARE_PROVIDER_SITE_OTHER): Payer: Medicare Other | Admitting: Family Medicine

## 2019-03-01 ENCOUNTER — Ambulatory Visit: Payer: Medicare Other | Admitting: Family Medicine

## 2019-03-01 ENCOUNTER — Encounter: Payer: Self-pay | Admitting: Family Medicine

## 2019-03-01 DIAGNOSIS — E119 Type 2 diabetes mellitus without complications: Secondary | ICD-10-CM

## 2019-03-01 DIAGNOSIS — J3089 Other allergic rhinitis: Secondary | ICD-10-CM

## 2019-03-01 DIAGNOSIS — I10 Essential (primary) hypertension: Secondary | ICD-10-CM

## 2019-03-01 DIAGNOSIS — I2510 Atherosclerotic heart disease of native coronary artery without angina pectoris: Secondary | ICD-10-CM | POA: Diagnosis not present

## 2019-03-01 DIAGNOSIS — E782 Mixed hyperlipidemia: Secondary | ICD-10-CM

## 2019-03-01 DIAGNOSIS — J439 Emphysema, unspecified: Secondary | ICD-10-CM

## 2019-03-01 DIAGNOSIS — E1142 Type 2 diabetes mellitus with diabetic polyneuropathy: Secondary | ICD-10-CM

## 2019-03-01 NOTE — Progress Notes (Signed)
Virtual Visit via telephone Note  I connected with Eduardo [name] on 03/01/19 at 1320 by telephone and verified that I am speaking with the correct person using two identifiers. Eduardo [name] is currently located at home and no other people are currently with her during visit. The provider, [name] Dettinger, MD is located in their office at time of visit.  Call ended at 1340  I discussed the limitations, risks, security and privacy concerns of performing an evaluation and management service by telephone and the availability of in person appointments. I also discussed with the patient that there may be a patient responsible charge related to this service. The patient expressed understanding and agreed to proceed.   History and Present Illness: Patient has been having chest pain in the ed 2 times over the past 3 weeks because he upset about wearing a mask.   Type 2 diabetes mellitus Patient comes in today for recheck of his diabetes. Patient has been currently taking metformin, a1c 5.4. Patient is not currently on an ACE inhibitor/ARB. Patient has seen an ophthalmologist this year. Patient denies any issues with their feet. Patient has neuropathy and duloxetine for it and PTSD  Hypertension and CAD with history of CABG Patient is currently on carvedilol, and their blood pressure today is unknown because he is at home. Patient denies any lightheadedness or dizziness. Patient denies headaches, blurred vision, chest pains, shortness of breath, or weakness. Denies any side effects from medication and is content with current medication.   Hyperlipidemia Patient is coming in for recheck of his hyperlipidemia. The patient is currently taking Zetia and gemfibrozil. They deny any issues with myalgias or history of liver damage from it. They deny any focal numbness or weakness or chest pain.   No diagnosis found.  Outpatient Encounter Medications as of 03/01/2019  Medication Sig  .  albuterol (PROVENTIL HFA;VENTOLIN HFA) 108 (90 BASE) MCG/ACT inhaler Inhale 1 puff into the lungs every 6 (six) hours as needed for wheezing or shortness of breath.   [name] albuterol (PROVENTIL) (2.5 MG/3ML) 0.083% nebulizer solution Take 3 mLs (2.5 mg total) by nebulization every 4 (four) hours as needed for wheezing or shortness of breath.  [name] albuterol (VENTOLIN HFA) 108 (90 Base) MCG/ACT inhaler Inhale 2 puffs into the lungs every 4 (four) hours as needed for wheezing or shortness of breath.  [name] aspirin EC 81 MG EC tablet Take 1 tablet (81 mg total) by mouth daily.  . budesonide-formoterol (SYMBICORT) 80-4.5 MCG/ACT inhaler Inhale 2 puffs into the lungs 2 (two) times daily.  . carvedilol (COREG) 12.5 MG tablet Take 12.5 mg by mouth 2 (two) times daily.   . cetirizine (ZYRTEC) 10 MG tablet Take 1 tablet (10 mg total) by mouth daily.  . Cholecalciferol (VITAMIN D3) 5000 units CAPS Take 5,000 Units by mouth daily.  . clopidogrel (PLAVIX) 75 MG tablet Take 75 mg by mouth daily.  [name] doxycycline (VIBRA-TABS) 100 MG tablet Take 1 tablet (100 mg total) by mouth 2 (two) times daily.  . DULoxetine (CYMBALTA) 60 MG capsule Take 90 mg by mouth daily.  [name] ezetimibe (ZETIA) 10 MG tablet Take 10 mg by mouth at bedtime.   [name] gemfibrozil (LOPID) 600 MG tablet Take 600 mg by mouth 2 (two) times daily before a meal.  . metFORMIN (GLUCOPHAGE) 1000 MG tablet Take 1,000 mg by mouth 2 (two) times daily with a meal.  . nitroGLYCERIN (NITROSTAT) 0.4 MG SL tablet Place 0.4 mg under the tongue every  5 (five) minutes as needed for chest pain.  . pantoprazole (PROTONIX) 40 MG tablet Take 1 tablet (40 mg total) by mouth 2 (two) times daily before a meal. Before breakfast and supper  . predniSONE (DELTASONE) 20 MG tablet Take 2 tablets (40 mg total) by mouth daily.  . tamsulosin (FLOMAX) 0.4 MG CAPS capsule Take 1 capsule (0.4 mg total) by mouth daily.  [name] tetrahydrozoline 0.05 % ophthalmic solution Place 1-2 drops into both eyes 3  (three) times daily as needed (for dry/irritated eyes).  [name] tiotropium (SPIRIVA) 18 MCG inhalation capsule Place 18 mcg into inhaler and inhale daily as needed (for breathing).  . [DISCONTINUED] omeprazole (PRILOSEC) 20 MG capsule Take 20 mg by mouth daily.   No facility-administered encounter medications on file as of 03/01/2019.     Review of Systems  Constitutional: Negative for chills and fever.  HENT: Positive for congestion.   Respiratory: Positive for cough and shortness of breath. Negative for wheezing.   Cardiovascular: Negative for chest pain, palpitations and leg swelling.  Musculoskeletal: Negative for back pain and gait problem.  Skin: Negative for rash.  Neurological: Negative for dizziness, weakness and numbness.  All other systems reviewed and are negative.   Observations/Objective: Patient sounds comfortable and in no acute distress  Assessment and Plan: Problem List Items Addressed This Visit      Cardiovascular and Mediastinum   HTN (hypertension) (Chronic)     Respiratory   COPD (chronic obstructive pulmonary disease) (HCC) (Chronic)     Endocrine   Type 2 diabetes mellitus without complication, without long-term current use of insulin (HCC) - Primary (Chronic)   Diabetic neuropathy ([place])     Other   Hyperlipidemia (Chronic)    Other Visit Diagnoses    Allergic rhinitis due to other allergic trigger, unspecified seasonality          Patient gets all of his medications through the [place] and will continue to get his medications through the [place] Follow Up Instructions: Follow up as needed    I discussed the assessment and treatment plan with the patient. The patient was provided an opportunity to ask questions and all were answered. The patient agreed with the plan and demonstrated an understanding of the instructions.   The patient was advised to call back or seek an in-person evaluation if the symptoms worsen or if the condition fails to improve as  anticipated.  The above assessment and management plan was discussed with the patient. The patient verbalized understanding of and has agreed to the management plan. Patient is aware to call the clinic if symptoms persist or worsen. Patient is aware when to return to the clinic for a follow-up visit. Patient educated on when it is appropriate to go to the emergency department.    I provided 20 minutes of non-face-to-face time during this encounter.    Worthy Rancher, MD

## 2019-05-08 DIAGNOSIS — J302 Other seasonal allergic rhinitis: Secondary | ICD-10-CM | POA: Diagnosis not present

## 2019-05-08 DIAGNOSIS — E1165 Type 2 diabetes mellitus with hyperglycemia: Secondary | ICD-10-CM | POA: Diagnosis not present

## 2019-05-08 DIAGNOSIS — I25729 Atherosclerosis of autologous artery coronary artery bypass graft(s) with unspecified angina pectoris: Secondary | ICD-10-CM | POA: Diagnosis not present

## 2019-05-08 DIAGNOSIS — K219 Gastro-esophageal reflux disease without esophagitis: Secondary | ICD-10-CM | POA: Diagnosis not present

## 2019-05-08 DIAGNOSIS — E559 Vitamin D deficiency, unspecified: Secondary | ICD-10-CM | POA: Diagnosis not present

## 2019-05-08 DIAGNOSIS — Z0189 Encounter for other specified special examinations: Secondary | ICD-10-CM | POA: Diagnosis not present

## 2019-05-08 DIAGNOSIS — N400 Enlarged prostate without lower urinary tract symptoms: Secondary | ICD-10-CM | POA: Diagnosis not present

## 2019-05-08 DIAGNOSIS — E785 Hyperlipidemia, unspecified: Secondary | ICD-10-CM | POA: Diagnosis not present

## 2019-05-08 DIAGNOSIS — H04129 Dry eye syndrome of unspecified lacrimal gland: Secondary | ICD-10-CM | POA: Diagnosis not present

## 2019-05-08 DIAGNOSIS — G894 Chronic pain syndrome: Secondary | ICD-10-CM | POA: Diagnosis not present

## 2019-05-08 DIAGNOSIS — F4312 Post-traumatic stress disorder, chronic: Secondary | ICD-10-CM | POA: Diagnosis not present

## 2019-05-08 DIAGNOSIS — J449 Chronic obstructive pulmonary disease, unspecified: Secondary | ICD-10-CM | POA: Diagnosis not present

## 2019-10-10 ENCOUNTER — Other Ambulatory Visit: Payer: Self-pay | Admitting: *Deleted

## 2019-10-10 DIAGNOSIS — I712 Thoracic aortic aneurysm, without rupture, unspecified: Secondary | ICD-10-CM

## 2019-11-14 ENCOUNTER — Ambulatory Visit: Payer: Medicare Other | Admitting: Cardiothoracic Surgery

## 2019-11-14 ENCOUNTER — Inpatient Hospital Stay: Admission: RE | Admit: 2019-11-14 | Payer: Medicare Other | Source: Ambulatory Visit

## 2019-12-26 ENCOUNTER — Ambulatory Visit: Payer: Medicare Other | Admitting: Cardiothoracic Surgery

## 2020-01-30 ENCOUNTER — Ambulatory Visit: Payer: Medicare Other | Admitting: Cardiothoracic Surgery

## 2021-10-01 ENCOUNTER — Other Ambulatory Visit: Payer: Self-pay

## 2021-10-01 ENCOUNTER — Other Ambulatory Visit: Payer: Self-pay | Admitting: Thoracic Surgery (Cardiothoracic Vascular Surgery)

## 2021-10-01 DIAGNOSIS — I712 Thoracic aortic aneurysm, without rupture, unspecified: Secondary | ICD-10-CM

## 2021-12-07 ENCOUNTER — Encounter: Payer: Medicare Other | Admitting: Thoracic Surgery (Cardiothoracic Vascular Surgery)

## 2021-12-17 ENCOUNTER — Ambulatory Visit
Admission: RE | Admit: 2021-12-17 | Discharge: 2021-12-17 | Disposition: A | Discharge status: Home / Self Care | Payer: Medicare Other | Source: Ambulatory Visit | Attending: Thoracic Surgery (Cardiothoracic Vascular Surgery) | Admitting: Thoracic Surgery (Cardiothoracic Vascular Surgery)

## 2021-12-17 DIAGNOSIS — I712 Thoracic aortic aneurysm, without rupture, unspecified: Secondary | ICD-10-CM

## 2021-12-17 MED ORDER — IOPAMIDOL (ISOVUE-370) INJECTION 76%
75.0000 mL | Freq: Once | INTRAVENOUS | Status: AC | PRN
  Administered 2021-12-17: 75 mL via INTRAVENOUS

## 2021-12-21 ENCOUNTER — Encounter: Payer: Self-pay | Admitting: Thoracic Surgery (Cardiothoracic Vascular Surgery)

## 2021-12-21 ENCOUNTER — Institutional Professional Consult (permissible substitution) (INDEPENDENT_AMBULATORY_CARE_PROVIDER_SITE_OTHER): Payer: Medicare Other | Admitting: Thoracic Surgery (Cardiothoracic Vascular Surgery)

## 2021-12-21 VITALS — BP 152/87 | HR 72 | Resp 20 | Ht 68.0 in | Wt 165.0 lb

## 2021-12-21 DIAGNOSIS — I712 Thoracic aortic aneurysm, without rupture, unspecified: Secondary | ICD-10-CM

## 2021-12-21 NOTE — Progress Notes (Signed)
[address]       [place] [address]             [phone]     HPI: Mr. Cotten returns for follow-up of his ascending aneurysm.  Jahmari Esbenshade is a 76 year old man with a history of coronary disease, coronary bypass grafting, mild carotid disease, agent orange exposure, COPD, diabetes complicated by neuropathy, hypertension, hyperlipidemia, reflux, peripheral neuropathy, PTSD, sleep apnea, and an ascending thoracic aortic aneurysm.  He had coronary bypass grafting by Dr. [name].  He was followed by him for an ascending aneurysm for several years after that.  He was last seen in our office in 2020.  He moved to [place] for a few years but has now moved back to [place].  He is not having any chest pain, pressure, or tightness.  He does have chronic dyspnea due to his agent orange/COPD.  Past Medical History:  Diagnosis Date   Arthritis    Bilateral carotid artery disease ([place]) 04/28/2015   1-39 percent bilateral stenosis noted on Doppler    CAD (coronary artery disease)    a. s/p CABG in 2016 with LIMA-LAD, reverse SVG-D2, reverse SVG-OM1, and reverse SVG-PDA   Cervical disc disease    Chemical exposure    agent orange    Colon polyps    COPD (chronic obstructive pulmonary disease) ([place])    Diabetes mellitus without complication ([place])    Diabetic neuropathy ([place]) 03/07/2018   Diverticulitis    Gallstones    GERD (gastroesophageal reflux disease)    Hyperlipidemia    Hypertension    Lumbar disc disease    Multiple gastric ulcers    Obesity (BMI 30-39.9)    Peripheral neuropathy    in all extremities   PTSD (post-traumatic stress disorder)    Sleep apnea    can't wear cpap   Thoracic ascending aortic aneurysm (HCC)     Current Outpatient Medications  Medication Sig Dispense Refill   albuterol (PROVENTIL HFA;VENTOLIN HFA) 108 (90 BASE) MCG/ACT inhaler Inhale 1 puff into the lungs every 6 (six) hours as needed for wheezing or  shortness of breath.      albuterol (PROVENTIL) (2.5 MG/3ML) 0.083% nebulizer solution Take 3 mLs (2.5 mg total) by nebulization every 4 (four) hours as needed for wheezing or shortness of breath. 75 mL 0   albuterol (VENTOLIN HFA) 108 (90 Base) MCG/ACT inhaler Inhale 2 puffs into the lungs every 4 (four) hours as needed for wheezing or shortness of breath. 8 g 0   aspirin EC 81 MG EC tablet Take 1 tablet (81 mg total) by mouth daily.     budesonide-formoterol (SYMBICORT) 80-4.5 MCG/ACT inhaler Inhale 2 puffs into the lungs 2 (two) times daily. 1 Inhaler 3   carvedilol (COREG) 12.5 MG tablet Take 12.5 mg by mouth 2 (two) times daily.      cetirizine (ZYRTEC) 10 MG tablet Take 1 tablet (10 mg total) by mouth daily. 30 tablet 11   Cholecalciferol (VITAMIN D3) 5000 units CAPS Take 5,000 Units by mouth daily.     DULoxetine (CYMBALTA) 60 MG capsule Take 90 mg by mouth daily.     ezetimibe (ZETIA) 10 MG tablet Take 10 mg by mouth at bedtime.      gemfibrozil (LOPID) 600 MG tablet Take 600 mg by mouth 2 (two) times daily before a meal.     metFORMIN (GLUCOPHAGE) 1000 MG tablet Take 1,000 mg by mouth 2 (two) times daily with  a meal.     nitroGLYCERIN (NITROSTAT) 0.4 MG SL tablet Place 0.4 mg under the tongue every 5 (five) minutes as needed for chest pain.     pantoprazole (PROTONIX) 40 MG tablet Take 1 tablet (40 mg total) by mouth 2 (two) times daily before a meal. Before breakfast and supper 180 tablet 0   predniSONE (DELTASONE) 20 MG tablet Take 2 tablets (40 mg total) by mouth daily. 10 tablet 0   tamsulosin (FLOMAX) 0.4 MG CAPS capsule Take 1 capsule (0.4 mg total) by mouth daily. 90 capsule 3   tetrahydrozoline 0.05 % ophthalmic solution Place 1-2 drops into both eyes 3 (three) times daily as needed (for dry/irritated eyes).     tiotropium (SPIRIVA) 18 MCG inhalation capsule Place 18 mcg into inhaler and inhale daily as needed (for breathing).     clopidogrel (PLAVIX) 75 MG tablet Take 75 mg by  mouth daily. (Patient not taking: Reported on 12/21/2021)     No current facility-administered medications for this visit.    Physical Exam BP (!) 152/87   Pulse 72   Resp 20   Ht '5\' 8"'$  (1.727 m)   Wt 165 lb (74.8 kg)   SpO2 95% Comment: RA  BMI 25.29 kg/m  76 year old man in no acute distress Alert and oriented x3 with no focal motor deficit Lungs diminished but otherwise clear bilaterally, no rales or wheezing Cardiac regular rate and rhythm with normal S1 and S2 no murmur Abdomen, well-healed scar, no pulsatile mass Extremities no clubbing cyanosis or edema  Diagnostic Tests: CT ANGIOGRAPHY CHEST WITH CONTRAST   TECHNIQUE: Multidetector CT imaging of the chest was performed using the standard protocol during bolus administration of intravenous contrast. Multiplanar CT image reconstructions and MIPs were obtained to evaluate the vascular anatomy.   RADIATION DOSE REDUCTION: This exam was performed according to the departmental dose-optimization program which includes automated exposure control, adjustment of the mA and/or kV according to patient size and/or use of iterative reconstruction technique.   CONTRAST:  46m ISOVUE-370 IOPAMIDOL (ISOVUE-370) INJECTION 76%   COMPARISON:  CTA chest 11/07/2018   FINDINGS: Cardiovascular: The heart is normal in size. No pericardial effusion. Stable tortuosity, ectasia and calcification of the thoracic aorta. Stable surgical changes from coronary artery bypass surgery. Stable fusiform aneurysmal dilatation of the ascending thoracic aorta with maximum measurement of 4.7 cm at the level of the sinus of Valsalva on both the sagittal reformatted images and the axial images. This measured 4.7 cm on the prior study using similar measuring technique. The mid ascending aorta is also mildly dilated again measuring 4.2 cm in diameter. No dissection. The branch vessels are patent. Coronary artery calcifications.   Mediastinum/Nodes: No  enlarged mediastinal, hilar, or axillary lymph nodes. Thyroid gland, trachea, and esophagus demonstrate no significant findings.   Lungs/Pleura: Paraseptal and centrilobular emphysema greatest in the upper lungs. Scattered calcified granulomas. Perifissural lymph nodes along the left major fissure. Scarring/atelectasis in lower lobes   Upper Abdomen: Cholelithiasis without cholecystitis.   Musculoskeletal: No chest wall abnormality. No acute or significant osseous findings.   Review of the MIP images confirms the above findings.   IMPRESSION: 1. Stable fusiform aneurysmal dilation of the ascending thoracic aorta with maximum measurement of 4.7 cm at the level of sinuses of Valsalva. This is not substantially changed from 2020 given similar measuring technique. No aortic dissection. Recommend semi-annual imaging followup by CTA or MRA and referral to cardiothoracic surgery if not already obtained. This recommendation follows 2010 ACCF/AHA/AATS/ACR/ASA/SCA/SCAI/SIR/STS/SVM Guidelines  for the Diagnosis and Management of Patients With Thoracic Aortic Disease. Circulation. 2010; 121: D149-F02. Aortic aneurysm NOS (ICD10-I71.9) 2. Aortic Atherosclerosis (ICD10-I70.0) and Emphysema (ICD10-J43.9).     Electronically Signed   By: Placido Sou M.D.   On: 12/17/2021 11:04 I personally reviewed the CT images.  There is a 4.2 cm ascending aneurysm.  Obviously the diameter is larger at the sinuses of Valsalva due to the normal shape of the aorta.  No change from scan in 2020.  Post CABG changes.  Impression: Florence Antonelli is a 76 year old man with a history of coronary disease, coronary bypass grafting, mild carotid disease, agent orange exposure, COPD, diabetes complicated by neuropathy, hypertension, hyperlipidemia, reflux, peripheral neuropathy, PTSD, sleep apnea, and an ascending thoracic aortic aneurysm.  CAD-status post CABG in 2016.  No anginal symptoms.  Thoracic aortic  aneurysm-ascending diameter 4.2 cm, stable from 2020.  Measures larger at the sinuses of Valsalva as expected.  Also unchanged from 2020.  Needs continued annual follow-up.  Hypertension-blood pressure elevated.  He has been compliant with medications.  He says he was having some family concerns over the last couple of days he thinks may have contributed to his blood pressure being elevated.  He does have a cuff at home but does not use it regularly.  Plan: Check blood pressure on a regular basis.  If remains elevated check with primary care. Return in 1 year with CT angio of chest.  We will also do an abdominal ultrasound at that time to rule out an abdominal aortic aneurysm as he has not previously been screened for that.  I spent over 20 minutes in review of records, images, and in consultation with Mr. Jennette Kettle today. Melrose Nakayama, MD [facility] [phone]

## 2022-03-23 ENCOUNTER — Encounter (INDEPENDENT_AMBULATORY_CARE_PROVIDER_SITE_OTHER): Payer: Self-pay | Admitting: *Deleted

## 2022-06-14 ENCOUNTER — Ambulatory Visit
Admission: EM | Admit: 2022-06-14 | Discharge: 2022-06-14 | Disposition: A | Discharge status: Home / Self Care | Payer: Medicare Other | Attending: Emergency Medicine | Admitting: Emergency Medicine

## 2022-06-14 DIAGNOSIS — J019 Acute sinusitis, unspecified: Secondary | ICD-10-CM | POA: Diagnosis not present

## 2022-06-14 DIAGNOSIS — J441 Chronic obstructive pulmonary disease with (acute) exacerbation: Secondary | ICD-10-CM | POA: Diagnosis not present

## 2022-06-14 MED ORDER — AMOXICILLIN-POT CLAVULANATE 875-125 MG PO TABS: 1.0000 | ORAL_TABLET | Freq: Two times a day (BID) | ORAL | 0 refills | Status: AC

## 2022-06-14 MED ORDER — FLUTICASONE PROPIONATE 50 MCG/ACT NA SUSP: 2.0000 | Freq: Every day | NASAL | 0 refills | Status: AC

## 2022-06-14 MED ORDER — PREDNISONE 50 MG PO TABS: 50.0000 mg | ORAL_TABLET | Freq: Every day | ORAL | 0 refills | Status: AC

## 2022-06-14 MED ORDER — AZITHROMYCIN 250 MG PO TABS: 250.0000 mg | ORAL_TABLET | Freq: Every day | ORAL | 0 refills | Status: AC

## 2022-06-14 NOTE — ED Triage Notes (Signed)
Pt presents with congestion and productive cough with colored mucous that has been temporarily relieved with steriods & antibiotics over the course of about a month.

## 2022-06-14 NOTE — Discharge Instructions (Signed)
Discontinue NyQuil, DayQuil, start Mucinex, saline nasal irrigation with a Milta Deiters Med rinse and distilled water as often as you want, Flonase.  2 puffs from your albuterol inhaler using your spacer nebulizer treatment every 4 hours for 2 days, then every 6 hours for 2 days, then as needed.  You can back off on the albuterol if you start to improve sooner.  Finish prednisone, even if you feel better.  This will elevate your blood sugars.  Adjust your medications accordingly.  Finish the Augmentin and azithromycin.

## 2022-06-14 NOTE — ED Provider Notes (Signed)
HPI  SUBJECTIVE:  Eduardo [name] is a 77 y.o. male who presents with 1 month of nasal and chest congestion.  Reports coughing and blowing out yellow, thick mucus this morning.  No fevers, rhinorrhea, sinus pain or pressure, upper dental pain, postnasal drip, wheezing, change in his baseline shortness of breath or dyspnea on exertion.  He has been taking NyQuil, DayQuil in addition to doxycycline and prednisone prescribed scribed to him by the ER.  He is not using his albuterol nebulizer at all.  Symptoms are better with doxycycline and prednisone.  No aggravating factors.  Patient was seen in the ER 10 days ago for identical symptoms, found to have a COPD exacerbation.  COVID, flu, negative.  Chest x-ray normal, BUN, creatinine were also normal.  He was sent home with doxycycline and prednisone, which she states that he finished 2 days ago.  He states that this made him feel much better.  She has an extensive past medical history including COPD, coronary disease, carotid artery disease, diabetes, 4.9 cm thoracic aortic aneurysm, hypertension.  He was admitted in November for pneumonia, acute respiratory failure and sepsis in November 23  Past Medical History:  Diagnosis Date   Arthritis    Bilateral carotid artery disease ([place]) 04/28/2015   1-39 percent bilateral stenosis noted on Doppler    CAD (coronary artery disease)    a. s/p CABG in 2016 with LIMA-LAD, reverse SVG-D2, reverse SVG-OM1, and reverse SVG-PDA   Cervical disc disease    Chemical exposure    agent orange    Colon polyps    COPD (chronic obstructive pulmonary disease) ([place])    Diabetes mellitus without complication ([place])    Diabetic neuropathy ([place]) 03/07/2018   Diverticulitis    Gallstones    GERD (gastroesophageal reflux disease)    Hyperlipidemia    Hypertension    Lumbar disc disease    Multiple gastric ulcers    Obesity (BMI 30-39.9)    Peripheral neuropathy    in all extremities   PTSD (post-traumatic  stress disorder)    Sleep apnea    can't wear cpap   Thoracic ascending aortic aneurysm [facility])     Past Surgical History:  Procedure Laterality Date   ANAL RECTAL MANOMETRY N/A 02/05/2018   Procedure: ANO RECTAL MANOMETRY;  Surgeon: [name], MD;  Location: [facility];  Service: Endoscopy;  Laterality: N/A;   CARDIAC CATHETERIZATION     CARDIAC CATHETERIZATION  03/18/14   difficult to determine culprit vesel   CARDIAC CATHETERIZATION N/A 04/29/2015   Procedure: Left Heart Cath and Coronary Angiography;  Surgeon: [name], MD;  Location: [place] CV LAB;  Service: Cardiovascular;  Laterality: N/A;   CARPOMETACARPAL ([facility]) FUSION OF THUMB Right 08/04/2016   Procedure: CARPOMETACARPAL [facility]) FUSION OF THUMB;  Surgeon: Iran Planas, MD;  Location: [place];  Service: Orthopedics;  Laterality: Right;   CERVICAL SPINE SURGERY     COLONOSCOPY N/A 04/05/2017   Procedure: COLONOSCOPY;  Surgeon: Rogene Houston, MD;  [facility];  Service: Endoscopy;  Laterality: N/A;  830   CORONARY ARTERY BYPASS GRAFT N/A 05/04/2015   Procedure: CORONARY ARTERY BYPASS GRAFTING (CABG)  x four,  using left internal mammary artery, and bilateral thigh greater saphenous veins;  Surgeon: [name], MD;  Location: [place];  Service: Open Heart Surgery;  Laterality: N/A;   EYE SURGERY Bilateral    cataract   FOOT SURGERY Left    "little toe"   I &  D EXTREMITY Left 05/30/2013   Procedure: IRRIGATION AND DEBRIDEMENT Left Hand and Foreign body removal;  Surgeon: [name], MD;  Location: [place];  Service: Orthopedics;  Laterality: Left;   LEFT HEART CATHETERIZATION WITH CORONARY ANGIOGRAM N/A 03/18/2014   Procedure: LEFT HEART CATHETERIZATION WITH CORONARY ANGIOGRAM;  Surgeon: [name], MD;  Location: [facility] CATH LAB;  Service: Cardiovascular;  Laterality: N/A;   LUMBAR LAMINECTOMY     PARTIAL COLECTOMY     For diverticulitis   PTCA     unsuccesful   REPLACEMENT TOTAL KNEE      right    SHOULDER SURGERY Bilateral    x 2 (on both shoulder)_   TEE WITHOUT CARDIOVERSION N/A 05/04/2015   Procedure: TRANSESOPHAGEAL ECHOCARDIOGRAM (TEE);  Surgeon: [name], MD;  Location: [place];  Service: Open Heart Surgery;  Laterality: N/A;   thumb surgery Left     Family History  Problem Relation Age of Onset   Diabetes Father    Heart disease Father    Hypertension Father    Diabetes Paternal Grandmother    Heart disease Paternal Grandmother    Hypertension Paternal Grandmother    Diabetes Paternal Uncle    Heart disease Paternal Uncle    Heart disease Paternal Uncle    Hypertension Paternal Uncle    Heart disease Paternal Uncle    Hypertension Mother    Heart disease Mother    Drug abuse Son    Colon cancer Neg Hx    Esophageal cancer Neg Hx    Pancreatic cancer Neg Hx    Stomach cancer Neg Hx    Liver disease Neg Hx     Social History   Tobacco Use   Smoking status: Former    Packs/day: 0.75    Years: 30.00    Total pack years: 22.50    Types: Cigarettes    Start date: 1962    Quit date: 05/31/1987    Years since quitting: 35.0   Smokeless tobacco: Never  Vaping Use   Vaping Use: Never used  Substance Use Topics   Alcohol use: No    Alcohol/week: 0.0 standard drinks of alcohol   Drug use: No    No current facility-administered medications for this encounter.  Current Outpatient Medications:    amoxicillin-clavulanate (AUGMENTIN) 875-125 MG tablet, Take 1 tablet by mouth every 12 (twelve) hours., Disp: 14 tablet, Rfl: 0   azithromycin (ZITHROMAX) 250 MG tablet, Take 1 tablet (250 mg total) by mouth daily. 2 tabs po on day 1, 1 tab po on days 2-5, Disp: 6 tablet, Rfl: 0   fluticasone (FLONASE) 50 MCG/ACT nasal spray, Place 2 sprays into both nostrils daily., Disp: 16 g, Rfl: 0   predniSONE (DELTASONE) 50 MG tablet, Take 1 tablet (50 mg total) by mouth daily with breakfast., Disp: 5 tablet, Rfl: 0   albuterol (PROVENTIL HFA;VENTOLIN HFA) 108  (90 BASE) MCG/ACT inhaler, Inhale 1 puff into the lungs every 6 (six) hours as needed for wheezing or shortness of breath. , Disp: , Rfl:    albuterol (PROVENTIL) (2.5 MG/3ML) 0.083% nebulizer solution, Take 3 mLs (2.5 mg total) by nebulization every 4 (four) hours as needed for wheezing or shortness of breath., Disp: 75 mL, Rfl: 0   albuterol (VENTOLIN HFA) 108 (90 Base) MCG/ACT inhaler, Inhale 2 puffs into the lungs every 4 (four) hours as needed for wheezing or shortness of breath., Disp: 8 g, Rfl: 0   aspirin EC 81 MG EC tablet, Take  1 tablet (81 mg total) by mouth daily., Disp: , Rfl:    budesonide-formoterol (SYMBICORT) 80-4.5 MCG/ACT inhaler, Inhale 2 puffs into the lungs 2 (two) times daily., Disp: 1 Inhaler, Rfl: 3   carvedilol (COREG) 12.5 MG tablet, Take 12.5 mg by mouth 2 (two) times daily. , Disp: , Rfl:    cetirizine (ZYRTEC) 10 MG tablet, Take 1 tablet (10 mg total) by mouth daily., Disp: 30 tablet, Rfl: 11   Cholecalciferol (VITAMIN D3) 5000 units CAPS, Take 5,000 Units by mouth daily., Disp: , Rfl:    clopidogrel (PLAVIX) 75 MG tablet, Take 75 mg by mouth daily. (Patient not taking: Reported on 12/21/2021), Disp: , Rfl:    DULoxetine (CYMBALTA) 60 MG capsule, Take 90 mg by mouth daily., Disp: , Rfl:    ezetimibe (ZETIA) 10 MG tablet, Take 10 mg by mouth at bedtime. , Disp: , Rfl:    gemfibrozil (LOPID) 600 MG tablet, Take 600 mg by mouth 2 (two) times daily before a meal., Disp: , Rfl:    metFORMIN (GLUCOPHAGE) 1000 MG tablet, Take 1,000 mg by mouth 2 (two) times daily with a meal., Disp: , Rfl:    nitroGLYCERIN (NITROSTAT) 0.4 MG SL tablet, Place 0.4 mg under the tongue every 5 (five) minutes as needed for chest pain., Disp: , Rfl:    pantoprazole (PROTONIX) 40 MG tablet, Take 1 tablet (40 mg total) by mouth 2 (two) times daily before a meal. Before breakfast and supper, Disp: 180 tablet, Rfl: 0   tamsulosin (FLOMAX) 0.4 MG CAPS capsule, Take 1 capsule (0.4 mg total) by mouth daily.,  Disp: 90 capsule, Rfl: 3   tetrahydrozoline 0.05 % ophthalmic solution, Place 1-2 drops into both eyes 3 (three) times daily as needed (for dry/irritated eyes)., Disp: , Rfl:    tiotropium (SPIRIVA) 18 MCG inhalation capsule, Place 18 mcg into inhaler and inhale daily as needed (for breathing)., Disp: , Rfl:   Allergies  Allergen Reactions   Statins Other (See Comments)    Body aches   Benazepril     hyperkalemia   Quinolones     Patient was warned about not using Cipro and similar antibiotics. Recent studies have raised concern that fluoroquinolone antibiotics could be associated with an increased risk of aortic aneurysm Fluoroquinolones have non-antimicrobial properties that might jeopardise the integrity of the extracellular matrix of the vascular wall In a  propensity score matched cohort study in [place], there was a 66% increased rate of aortic aneurysm or dissection associated with oral fluoroquinolone use, compared wit     ROS  As noted in HPI.   Physical Exam  BP (!) 150/74 (BP Location: Left Arm)   Pulse 92   Temp 98.2 F (36.8 C) (Oral)   Resp 17   SpO2 94%   Constitutional: Well developed, well nourished, no acute distress Eyes:  EOMI, conjunctiva normal bilaterally HENT: Normocephalic, atraumatic,mucus membranes moist.  Positive nasal congestion.  Erythematous, but not swollen turbinates.  No maxillary, frontal sinus tenderness.  No postnasal drip Respiratory: Normal inspiratory effort, poor air movement.  Lungs clear bilaterally. Cardiovascular: Normal rate, regular rhythm, no murmurs rubs or gallops GI: nondistended skin: No rash, skin intact Musculoskeletal: no deformities Neurologic: Alert & oriented x 3, no focal neuro deficits Psychiatric: Speech and behavior appropriate   ED Course   Medications - No data to display  No orders of the defined types were placed in this encounter.   No results found for this or any previous visit (from the  past 24  hour(s)). No results found.  ED Clinical Impression  1. COPD exacerbation ([place])   2. Acute non-recurrent sinusitis, unspecified location      ED Assessment/Plan     Outside records reviewed.  As noted in HPI.  1.  URI/sinus suspect sinusitis given duration of symptoms of 1 month.  Discontinue cold medications, start Mucinex, saline nasal irrigation, Flonase.  Will treat with a second round of antibiotics.  2.  COPD exacerbation.  Patient has not been using his albuterol.  Will have him do regularly scheduled albuterol inhaler with a spacer for the next 4 days, then as needed. states he does not need a prescription for either 1 of these.  prednisone 50 mg for 5 days and then Augmentin for 7 days to take care of a sinusitis and COPD exacerbation and 5 days of azithromycin.  No fluoroquinolones as he has a thoracic aortic aneurysm and fluoroquinolones can make this worse.  Follow-up with PCP as needed.  He has follow-up scheduled with his pulmonologist in a few days.  Discussed  MDM, treatment plan, and plan for follow-up with patient. Discussed sn/sx that should prompt return to the ED. patient agrees with plan.   Meds ordered this encounter  Medications   amoxicillin-clavulanate (AUGMENTIN) 875-125 MG tablet    Sig: Take 1 tablet by mouth every 12 (twelve) hours.    Dispense:  14 tablet    Refill:  0   predniSONE (DELTASONE) 50 MG tablet    Sig: Take 1 tablet (50 mg total) by mouth daily with breakfast.    Dispense:  5 tablet    Refill:  0   azithromycin (ZITHROMAX) 250 MG tablet    Sig: Take 1 tablet (250 mg total) by mouth daily. 2 tabs po on day 1, 1 tab po on days 2-5    Dispense:  6 tablet    Refill:  0   fluticasone (FLONASE) 50 MCG/ACT nasal spray    Sig: Place 2 sprays into both nostrils daily.    Dispense:  16 g    Refill:  0      *This clinic note was created using [employer]. Therefore, there may be occasional mistakes despite careful  proofreading.  ?    Melynda Ripple, MD 06/14/22 1204

## 2022-08-12 ENCOUNTER — Emergency Department (HOSPITAL_COMMUNITY)
Admission: EM | Admit: 2022-08-12 | Discharge: 2022-08-13 | Disposition: A | Discharge status: Home / Self Care | Payer: Medicare Other | Attending: Emergency Medicine | Admitting: Emergency Medicine

## 2022-08-12 ENCOUNTER — Emergency Department (HOSPITAL_COMMUNITY): Payer: Medicare Other

## 2022-08-12 ENCOUNTER — Other Ambulatory Visit: Payer: Self-pay

## 2022-08-12 ENCOUNTER — Encounter (HOSPITAL_COMMUNITY): Payer: Self-pay

## 2022-08-12 DIAGNOSIS — R0789 Other chest pain: Secondary | ICD-10-CM | POA: Diagnosis present

## 2022-08-12 DIAGNOSIS — I10 Essential (primary) hypertension: Secondary | ICD-10-CM | POA: Diagnosis not present

## 2022-08-12 DIAGNOSIS — J449 Chronic obstructive pulmonary disease, unspecified: Secondary | ICD-10-CM | POA: Diagnosis not present

## 2022-08-12 DIAGNOSIS — Z7984 Long term (current) use of oral hypoglycemic drugs: Secondary | ICD-10-CM | POA: Diagnosis not present

## 2022-08-12 DIAGNOSIS — Z951 Presence of aortocoronary bypass graft: Secondary | ICD-10-CM | POA: Insufficient documentation

## 2022-08-12 DIAGNOSIS — E1140 Type 2 diabetes mellitus with diabetic neuropathy, unspecified: Secondary | ICD-10-CM | POA: Diagnosis not present

## 2022-08-12 DIAGNOSIS — Z7902 Long term (current) use of antithrombotics/antiplatelets: Secondary | ICD-10-CM | POA: Insufficient documentation

## 2022-08-12 DIAGNOSIS — Z1152 Encounter for screening for COVID-19: Secondary | ICD-10-CM | POA: Insufficient documentation

## 2022-08-12 DIAGNOSIS — Z7982 Long term (current) use of aspirin: Secondary | ICD-10-CM | POA: Insufficient documentation

## 2022-08-12 DIAGNOSIS — Z79899 Other long term (current) drug therapy: Secondary | ICD-10-CM | POA: Diagnosis not present

## 2022-08-12 DIAGNOSIS — R0602 Shortness of breath: Secondary | ICD-10-CM | POA: Diagnosis not present

## 2022-08-12 DIAGNOSIS — I2510 Atherosclerotic heart disease of native coronary artery without angina pectoris: Secondary | ICD-10-CM | POA: Insufficient documentation

## 2022-08-12 DIAGNOSIS — Z7951 Long term (current) use of inhaled steroids: Secondary | ICD-10-CM | POA: Insufficient documentation

## 2022-08-12 LAB — CBC
HCT: 52.7 % — ABNORMAL HIGH (ref 39.0–52.0)
Hemoglobin: 17.2 g/dL — ABNORMAL HIGH (ref 13.0–17.0)
MCH: 31.3 pg (ref 26.0–34.0)
MCHC: 32.6 g/dL (ref 30.0–36.0)
MCV: 96 fL (ref 80.0–100.0)
Platelets: 214 10*3/uL (ref 150–400)
RBC: 5.49 MIL/uL (ref 4.22–5.81)
RDW: 13.5 % (ref 11.5–15.5)
WBC: 5.1 10*3/uL (ref 4.0–10.5)
nRBC: 0 % (ref 0.0–0.2)

## 2022-08-12 LAB — BASIC METABOLIC PANEL
Anion gap: 10 (ref 5–15)
BUN: 23 mg/dL (ref 8–23)
CO2: 26 mmol/L (ref 22–32)
Calcium: 9.2 mg/dL (ref 8.9–10.3)
Chloride: 101 mmol/L (ref 98–111)
Creatinine, Ser: 0.78 mg/dL (ref 0.61–1.24)
GFR, Estimated: >60 mL/min (ref >60)
Glucose, Bld: 99 mg/dL (ref 70–99)
Potassium: 4.4 mmol/L (ref 3.5–5.1)
Sodium: 137 mmol/L (ref 135–145)

## 2022-08-12 LAB — URINALYSIS, W/ REFLEX TO CULTURE (INFECTION SUSPECTED)
Bacteria, UA: NONE SEEN
Bilirubin Urine: NEGATIVE
Glucose, UA: >=500 mg/dL — AB
Hgb urine dipstick: NEGATIVE
Ketones, ur: NEGATIVE mg/dL
Leukocytes,Ua: NEGATIVE
Nitrite: NEGATIVE
Protein, ur: NEGATIVE mg/dL
Specific Gravity, Urine: 1.016 (ref 1.005–1.030)
pH: 5 (ref 5.0–8.0)

## 2022-08-12 LAB — TROPONIN I (HIGH SENSITIVITY)
Troponin I (High Sensitivity): 6 ng/L (ref <18)
Troponin I (High Sensitivity): 6 ng/L (ref <18)

## 2022-08-12 MED ORDER — PANTOPRAZOLE SODIUM 40 MG IV SOLR
40.0000 mg | Freq: Once | INTRAVENOUS | Status: AC
  Administered 2022-08-13: 40 mg via INTRAVENOUS
  Filled 2022-08-12: qty 10

## 2022-08-12 MED ORDER — SODIUM CHLORIDE 0.9 % IV BOLUS
1000.0000 mL | Freq: Once | INTRAVENOUS | Status: AC
  Administered 2022-08-13: 1000 mL via INTRAVENOUS

## 2022-08-12 NOTE — ED Provider Notes (Signed)
[facility] Provider Note  CSN: [id number] Arrival date & time: 08/12/22 2016  Chief Complaint(s) Chest Pain  HPI Eduardo [name] is a 77 y.o. male with a past medical history listed below including hypertension, hyperlipidemia, CAD status post CABG in 2016, ascending aortic aneurysm last measuring 4.3 cm in May 2023, COPD not on supplemental oxygen and diabetes who presents to the emergency department with 2 days of chest pain described as soreness.  Yesterday pain was mild and intermittent.  Today has been constant since 6 AM.  Reports that he is also felt generally fatigued.  Pain worse with lying supine.  Improved by sitting up.  Pain was not exertional and nonradiating.  He is reporting mildly worse dyspnea on exertion from his baseline.  No recent fevers or infections.  No coughing or congestion.  No nausea or vomiting.  No new abdominal pain other than his chronic lower pain.  No other physical complaints.  Patient initially went to emergency department at [facility] but left prior to be seen.  That was 12 hours after pain onset.  Workup there was reassuring including: CBC without leukocytosis or anemia CMP without significant electrolyte derangements or renal sufficiency.  No evidence of bili obstruction. Troponin negative. Chest x-ray with mild atelectasis or infiltrate in the left lower base.  No pneumothorax, pulmonary edema or pleural effusions.  Patient did report recent travel to and from [place] in a 4 to 5 weeks ago.  Chest Pain   Past Medical History Past Medical History:  Diagnosis Date   Arthritis    Bilateral carotid artery disease ([place]) 04/28/2015   1-39 percent bilateral stenosis noted on Doppler    CAD (coronary artery disease)    a. s/p CABG in 2016 with LIMA-LAD, reverse SVG-D2, reverse SVG-OM1, and reverse SVG-PDA   Cervical disc disease    Chemical exposure    agent orange    Colon polyps    COPD (chronic  obstructive pulmonary disease) ([place])    Diabetes mellitus without complication ([place])    Diabetic neuropathy ([place]) 03/07/2018   Diverticulitis    Gallstones    GERD (gastroesophageal reflux disease)    Hyperlipidemia    Hypertension    Lumbar disc disease    Multiple gastric ulcers    Obesity (BMI 30-39.9)    Peripheral neuropathy    in all extremities   PTSD (post-traumatic stress disorder)    Sleep apnea    can't wear cpap   Thoracic ascending aortic aneurysm [facility])    Patient Active Problem List   Diagnosis Date Noted   Hx of agent Orange exposure 03/29/2018   Diabetic neuropathy ([place]) 03/07/2018   Incontinence of feces    Expressive aphasia 07/15/2016   Elevated TSH 05/08/2015   S/P CABG x 4    Bilateral carotid artery disease ([place]) 04/28/2015   BMI 28.0-28.9,adult    Unstable angina ([place]) 03/18/2014   Type 2 diabetes mellitus without complication, without long-term current use of insulin ([place]) 09/21/2013   COPD (chronic obstructive pulmonary disease) ([place]) 09/21/2013   PTSD (post-traumatic stress disorder) 09/21/2013   CAD (coronary artery disease) 04/16/2013   HTN (hypertension) 04/16/2013   Hyperlipidemia 04/16/2013   Home Medication(s) Prior to Admission medications   Medication Sig Start Date End Date Taking? Authorizing Provider  alum & mag hydroxide-simeth (MAALOX MAX) 400-400-40 MG/5ML suspension Take 10 mLs by mouth every 6 (six) hours as needed for indigestion. 08/13/22  Yes Ata Pecha, Grayce Sessions, MD  lidocaine (XYLOCAINE) 2 % solution Use as directed 10 mLs in the mouth or throat every 6 (six) hours as needed (stomach pain). 08/13/22  Yes Chan Sheahan, Grayce Sessions, MD  polyethylene glycol powder (MIRALAX) 17 GM/SCOOP powder Please start taking 1 capful 3 times a day. Slowly cut back as needed until you have normal bowel movements. 08/13/22  Yes Darnise Montag, Grayce Sessions, MD  albuterol (PROVENTIL HFA;VENTOLIN HFA) 108 (90 BASE) MCG/ACT inhaler Inhale 1 puff into the lungs  every 6 (six) hours as needed for wheezing or shortness of breath.     [provider]  albuterol (PROVENTIL) (2.5 MG/3ML) 0.083% nebulizer solution Take 3 mLs (2.5 mg total) by nebulization every 4 (four) hours as needed for wheezing or shortness of breath. 02/10/19   Francine Graven, DO  albuterol (VENTOLIN HFA) 108 (90 Base) MCG/ACT inhaler Inhale 2 puffs into the lungs every 4 (four) hours as needed for wheezing or shortness of breath. 02/10/19   Francine Graven, DO  amoxicillin-clavulanate (AUGMENTIN) 875-125 MG tablet Take 1 tablet by mouth every 12 (twelve) hours. 06/14/22   Melynda Ripple, MD  aspirin EC 81 MG EC tablet Take 1 tablet (81 mg total) by mouth daily. 05/10/15   Gold, Wayne E, PA-C  azithromycin (ZITHROMAX) 250 MG tablet Take 1 tablet (250 mg total) by mouth daily. 2 tabs po on day 1, 1 tab po on days 2-5 06/14/22   Melynda Ripple, MD  budesonide-formoterol [facility]) 80-4.5 MCG/ACT inhaler Inhale 2 puffs into the lungs 2 (two) times daily. 11/15/17   [name], MD  carvedilol (COREG) 12.5 MG tablet Take 12.5 mg by mouth 2 (two) times daily.  10/12/16   [provider]  cetirizine (ZYRTEC) 10 MG tablet Take 1 tablet (10 mg total) by mouth daily. 05/28/18   [name], MD  Cholecalciferol (VITAMIN D3) 5000 units CAPS Take 5,000 Units by mouth daily.    [provider]  clopidogrel (PLAVIX) 75 MG tablet Take 75 mg by mouth daily. Patient not taking: Reported on 12/21/2021    [provider]  DULoxetine (CYMBALTA) 60 MG capsule Take 90 mg by mouth daily.    [provider]  ezetimibe (ZETIA) 10 MG tablet Take 10 mg by mouth at bedtime.     [provider]  fluticasone (FLONASE) 50 MCG/ACT nasal spray Place 2 sprays into both nostrils daily. 06/14/22   Melynda Ripple, MD  gemfibrozil (LOPID) 600 MG tablet Take 600 mg by mouth 2 (two) times daily before a meal.    [provider]  metFORMIN (GLUCOPHAGE)  1000 MG tablet Take 1,000 mg by mouth 2 (two) times daily with a meal.    [provider]  nitroGLYCERIN (NITROSTAT) 0.4 MG SL tablet Place 0.4 mg under the tongue every 5 (five) minutes as needed for chest pain.    [provider]  pantoprazole (PROTONIX) 40 MG tablet Take 1 tablet (40 mg total) by mouth 2 (two) times daily before a meal. Before breakfast and supper 08/13/22   Dariane Natzke, Grayce Sessions, MD  predniSONE (DELTASONE) 50 MG tablet Take 1 tablet (50 mg total) by mouth daily with breakfast. 06/14/22   Melynda Ripple, MD  tamsulosin (FLOMAX) 0.4 MG CAPS capsule Take 1 capsule (0.4 mg total) by mouth daily. 05/28/18   [name], MD  tetrahydrozoline 0.05 % ophthalmic solution Place 1-2 drops into both eyes 3 (three) times daily as needed (for dry/irritated eyes).    [provider]  tiotropium (SPIRIVA) 18 MCG  inhalation capsule Place 18 mcg into inhaler and inhale daily as needed (for breathing).    [provider]  omeprazole (PRILOSEC) 20 MG capsule Take 20 mg by mouth daily.  01/25/18  [provider]                                                                                                                                    Allergies Benazepril, Niacin, Other, Quinolones, and Statins  Review of Systems Review of Systems  Cardiovascular:  Positive for chest pain.   As noted in HPI  Physical Exam Vital Signs  I have reviewed the triage vital signs BP 129/78   Pulse (!) 57   Temp (!) 97.5 F (36.4 C) (Oral)   Resp 15   Ht 5\' 5"  (1.651 m)   Wt 70.8 kg   SpO2 93%   BMI 25.96 kg/m   Physical Exam Vitals reviewed.  Constitutional:      General: He is not in acute distress.    Appearance: He is well-developed. He is not diaphoretic.  HENT:     Head: Normocephalic and atraumatic.     Nose: Nose normal.  Eyes:     General: No scleral icterus.       Right eye: No discharge.        Left eye: No discharge.      Conjunctiva/sclera: Conjunctivae normal.     Pupils: Pupils are equal, round, and reactive to light.  Cardiovascular:     Rate and Rhythm: Normal rate and regular rhythm.     Heart sounds: No murmur heard.    No friction rub. No gallop.  Pulmonary:     Effort: Pulmonary effort is normal. No respiratory distress.     Breath sounds: No stridor. No wheezing, rhonchi or rales.  Abdominal:     General: There is no distension.     Palpations: Abdomen is soft.     Tenderness: There is no abdominal tenderness.  Musculoskeletal:        General: No tenderness.     Cervical back: Normal range of motion and neck supple.  Skin:    General: Skin is warm and dry.     Findings: No erythema or rash.  Neurological:     Mental Status: He is alert and oriented to person, place, and time.     ED Results and Treatments Labs (all labs ordered are listed, but only abnormal results are displayed) Labs Reviewed  URINALYSIS, W/ REFLEX TO CULTURE (INFECTION SUSPECTED) - Abnormal; Notable for the following components:      Result Value   Glucose, UA >=500 (*)    All other components within normal limits  CBC - Abnormal; Notable for the following components:   Hemoglobin 17.2 (*)    HCT 52.7 (*)    All other components within normal limits  RESP PANEL BY RT-PCR (RSV, FLU A&B, COVID)  [id number]  BASIC METABOLIC PANEL  TROPONIN I (HIGH SENSITIVITY)  TROPONIN I (HIGH SENSITIVITY)                                                                                                                         EKG  EKG Interpretation  Date/Time:  Friday August 12 2022 22:59:37 EDT Ventricular Rate:  64 PR Interval:  246 QRS Duration: 90 QT Interval:  396 QTC Calculation: 408 R Axis:   47 Text Interpretation: Sinus rhythm with 1st degree A-V block with frequent Premature ventricular complexes Septal infarct , age undetermined Abnormal ECG When compared with ECG of 12-Aug-2022 20:42, PREVIOUS ECG IS PRESENT Confirmed  by Addison Lank [phone]) on 08/12/2022 11:18:58 PM       Radiology CT Angio Chest/Abd/Pel for Dissection W and/or Wo Contrast  Result Date: 08/13/2022 CLINICAL DATA:  Acute aortic syndrome EXAM: CT ANGIOGRAPHY CHEST, ABDOMEN AND PELVIS TECHNIQUE: Non-contrast CT of the chest was initially obtained. Multidetector CT imaging through the chest, abdomen and pelvis was performed using the standard protocol during bolus administration of intravenous contrast. Multiplanar reconstructed images and MIPs were obtained and reviewed to evaluate the vascular anatomy. RADIATION DOSE REDUCTION: This exam was performed according to the departmental dose-optimization program which includes automated exposure control, adjustment of the mA and/or kV according to patient size and/or use of iterative reconstruction technique. CONTRAST:  116mL OMNIPAQUE IOHEXOL 350 MG/ML SOLN COMPARISON:  12/17/2021, 08/04/2017 FINDINGS: CTA CHEST FINDINGS Cardiovascular: Stable mild dilation of the ascending thoracic aorta at the level of the sinuses of Valsalva measuring 4.8 cm in diameter. Stable dilation of the proximal descending thoracic aorta just beyond the aortic arch measuring 3.4 cm in diameter. No intramural hematoma or dissection. Mild atherosclerotic calcification. Mediastinum/Nodes: Status post coronary artery bypass grafting. Cardiac size is at the upper limits of normal, unchanged. No pericardial effusion. Central pulmonary arteries are of normal caliber. There is adequate opacification of the the pulmonary arterial tree through the segmental level and no intraluminal filling defect is identified to suggest acute pulmonary embolism. Lungs/Pleura: Lungs are clear. No pleural effusion or pneumothorax. Musculoskeletal: Moderate apically predominant emphysema, stable since prior examination. No superimposed focal pulmonary infiltrate. No pneumothorax or pleural effusion. Central airways are widely patent. Review of the MIP images  confirms the above findings. CTA ABDOMEN AND PELVIS FINDINGS VASCULAR Aorta: Normal caliber aorta without aneurysm, dissection, vasculitis or significant stenosis. Moderate atherosclerotic calcification. Celiac: Patent without evidence of aneurysm, dissection, vasculitis or significant stenosis. SMA: Patent without evidence of aneurysm, dissection, vasculitis or significant stenosis. Renals: Both renal arteries are patent without evidence of aneurysm, dissection, vasculitis, fibromuscular dysplasia or significant stenosis. IMA: Patent without evidence of aneurysm, dissection, vasculitis or significant stenosis. Inflow: Patent without evidence of aneurysm, dissection, vasculitis or significant stenosis. High-grade stenosis of the left internal iliac artery at its origin, however, this vessel remains patent. Left internal iliac artery is widely patent. Veins: No obvious venous abnormality within the limitations of this arterial phase study. Review  of the MIP images confirms the above findings. NON-VASCULAR Hepatobiliary: Cholelithiasis without pericholecystic inflammatory change noted. Liver unremarkable. No intra or extrahepatic biliary ductal dilation. Pancreas: Unremarkable Spleen: Unremarkable Adrenals/Urinary Tract: Adrenal glands are unremarkable. Kidneys are normal, without renal calculi, focal lesion, or hydronephrosis. Bladder is unremarkable. Stomach/Bowel: Moderate stool throughout the colon without evidence of obstruction. The stomach, small bowel, and large bowel are otherwise unremarkable. No evidence of obstruction or focal inflammation. No free intraperitoneal gas or fluid. Lymphatic: No pathologic adenopathy within the abdomen and pelvis. Reproductive: Prostate is unremarkable. Other: Tiny bilateral fat containing inguinal hernias. Musculoskeletal: L3-4 lumbar fusion with instrumentation with solid incorporation of interbody bone graft and ankylosis of the posterior elements. Degenerative changes are  seen within the a lumbar spine. No acute bone abnormality. No lytic or blastic bone lesion. Review of the MIP images confirms the above findings. IMPRESSION: 1. No acute intrathoracic or intra-abdominal pathology identified. No thoracoabdominal aortic aneurysm or dissection. 2. Stable dilation of the ascending thoracic aorta at the level of the sinuses of Valsalva measuring 4.8 cm in diameter. Stable dilation of the proximal descending thoracic aorta just beyond the aortic arch measuring 3.4 cm in diameter. Recommend semi-annual imaging followup by CTA or MRA and referral to cardiothoracic surgery if not already obtained. This recommendation follows 2010 ACCF/AHA/AATS/ACR/ASA/SCA/SCAI/SIR/STS/SVM Guidelines for the Diagnosis and Management of Patients With Thoracic Aortic Disease. Circulation. 2010; [id number]. Aortic aneurysm NOS (ICD10-I71.9) 3. Moderate emphysema. 4. Cholelithiasis. 5. Moderate stool throughout the colon without evidence of obstruction. Aortic Atherosclerosis (ICD10-I70.0) and Emphysema (ICD10-J43.9). Electronically Signed   By: Fidela Salisbury M.D.   On: 08/13/2022 00:33    Medications Ordered in ED Medications  pantoprazole (PROTONIX) injection 40 mg (40 mg Intravenous Given 08/13/22 0026)  sodium chloride 0.9 % bolus 1,000 mL (1,000 mLs Intravenous New Bag/Given 08/13/22 0026)  iohexol (OMNIPAQUE) 350 MG/ML injection 100 mL (100 mLs Intravenous Contrast Given 08/13/22 0013)  alum & mag hydroxide-simeth (MAALOX/MYLANTA) 200-200-20 MG/5ML suspension 30 mL (30 mLs Oral Given 08/13/22 0136)    And  lidocaine (XYLOCAINE) 2 % viscous mouth solution 15 mL (15 mLs Oral Given 08/13/22 0136)                                                                                                                                     Procedures Procedures  (including critical care time)  Medical Decision Making / ED Course  Click here for ABCD2, HEART and other calculators  Medical Decision  Making Amount and/or Complexity of Data Reviewed External Data Reviewed: labs and radiology.    Details: Noted in HPI Labs: ordered. Decision-making details documented in ED Course. Radiology: ordered and independent interpretation performed. Decision-making details documented in ED Course. ECG/medicine tests: ordered and independent interpretation performed. Decision-making details documented in ED Course.  Risk OTC drugs. Prescription drug management.     This patient presents to the ED for concern of atypical  chest pain, this involves an extensive number of treatment options, and is a complaint that carries with it a high risk of complications and morbidity. The differential diagnosis includes but not limited to favored GI related process but given patient's history and comorbidities, will rule out ACS, dissection, PE, pneumonia, pneumothorax.  EKG without acute ischemic changes or evidence of pericarditis Serial troponins negative x 2 here.  And has been approximately more than 16 hours since constant pain onset so I feel this is sufficient to rule out ACS in this clinical picture.  CBC without leukocytosis or anemia.  CBC does look hemoconcentrated though.  Possible early dehydration. Metabolic panel without significant electrolyte derangements Viral panel negative for COVID, influenza, RSV  CTA negative for dissection or PE.  No evidence of pneumonia, pneumothorax.  Patient treated with IV Protonix and GI cocktail providing complete relief of his pain favoring GI related process.  After this patient did admit to having increased acid reflux which she has been trying to treat with Tums over the past several days.      Final Clinical Impression(s) / ED Diagnoses Final diagnoses:  Other chest pain   The patient appears reasonably screened and/or stabilized for discharge and I doubt any other medical condition or other [facility] requiring further screening, evaluation, or treatment in  the ED at this time. I have discussed the findings, Dx and Tx plan with the patient/family who expressed understanding and agree(s) with the plan. Discharge instructions discussed at length. The patient/family was given strict return precautions who verbalized understanding of the instructions. No further questions at time of discharge.  Disposition: Discharge  Condition: Good  ED Discharge Orders          Ordered    alum & mag hydroxide-simeth (MAALOX MAX) [id number] MG/5ML suspension  Every 6 hours PRN        08/13/22 0305    lidocaine (XYLOCAINE) 2 % solution  Every 6 hours PRN        08/13/22 0305    polyethylene glycol powder (MIRALAX) 17 GM/SCOOP powder        08/13/22 0305    pantoprazole (PROTONIX) 40 MG tablet  2 times daily before meals        08/13/22 0305              Follow Up: Marjory Sneddon, MD [address] [place] [address] [phone]  Call  to schedule an appointment for close follow up  Bertrum Sol., MD [address] [place] [address] [phone]  Call  to schedule an appointment for close follow up           This chart was dictated using voice recognition software.  Despite best efforts to proofread,  errors can occur which can change the documentation meaning.    Fatima Blank, MD 08/13/22 [phone]

## 2022-08-12 NOTE — ED Provider Notes (Incomplete)
[facility] Provider Note  CSN: [id number] Arrival date & time: 08/12/22 2016  Chief Complaint(s) Chest Pain  HPI BRAN LYU is a 77 y.o. male with a past medical history listed below including hypertension, hyperlipidemia, CAD status post CABG in 2016, ascending aortic aneurysm last measuring 4.3 cm in May 2023, COPD not on supplemental oxygen and diabetes who presents to the emergency department with 2 days of chest pain described as soreness.  Yesterday pain was mild and intermittent.  Today has been constant since 6 AM.  Reports that he is also felt generally fatigued.  Pain worse with lying supine.  Improved by sitting up.  Pain was not exertional and nonradiating.  He is reporting mildly worse dyspnea on exertion from his baseline.  No recent fevers or infections.  No coughing or congestion.  No nausea or vomiting.  No new abdominal pain other than his chronic lower pain.  No other physical complaints.  Patient initially went to emergency department at [facility] but left prior to be seen.  That was 12 hours after pain onset.  Workup there was reassuring including: CBC without leukocytosis or anemia CMP without significant electrolyte derangements or renal sufficiency.  No evidence of bili obstruction. Troponin negative. Chest x-ray with mild atelectasis or infiltrate in the left lower base.  No pneumothorax, pulmonary edema or pleural effusions.  Chest Pain   Past Medical History Past Medical History:  Diagnosis Date  . Arthritis   . Bilateral carotid artery disease ([place]) 04/28/2015   1-39 percent bilateral stenosis noted on Doppler   . CAD (coronary artery disease)    a. s/p CABG in 2016 with LIMA-LAD, reverse SVG-D2, reverse SVG-OM1, and reverse SVG-PDA  . Cervical disc disease   . Chemical exposure    agent orange   . Colon polyps   . COPD (chronic obstructive pulmonary disease) ([place])   . Diabetes mellitus without  complication ([place])   . Diabetic neuropathy ([place]) 03/07/2018  . Diverticulitis   . Gallstones   . GERD (gastroesophageal reflux disease)   . Hyperlipidemia   . Hypertension   . Lumbar disc disease   . Multiple gastric ulcers   . Obesity (BMI 30-39.9)   . Peripheral neuropathy    in all extremities  . PTSD (post-traumatic stress disorder)   . Sleep apnea    can't wear cpap  . Thoracic ascending aortic aneurysm [facility])    Patient Active Problem List   Diagnosis Date Noted  . Hx of agent Orange exposure 03/29/2018  . Diabetic neuropathy ([place]) 03/07/2018  . Incontinence of feces   . Expressive aphasia 07/15/2016  . Elevated TSH 05/08/2015  . S/P CABG x 4   . Bilateral carotid artery disease ([place]) 04/28/2015  . BMI 28.0-28.9,adult   . Unstable angina ([place]) 03/18/2014  . Type 2 diabetes mellitus without complication, without long-term current use of insulin ([place]) 09/21/2013  . COPD (chronic obstructive pulmonary disease) ([place]) 09/21/2013  . PTSD (post-traumatic stress disorder) 09/21/2013  . CAD (coronary artery disease) 04/16/2013  . HTN (hypertension) 04/16/2013  . Hyperlipidemia 04/16/2013   Home Medication(s) Prior to Admission medications   Medication Sig Start Date End Date Taking? Authorizing Provider  albuterol (PROVENTIL HFA;VENTOLIN HFA) 108 (90 BASE) MCG/ACT inhaler Inhale 1 puff into the lungs every 6 (six) hours as needed for wheezing or shortness of breath.     [provider]  albuterol (PROVENTIL) (2.5 MG/3ML) 0.083% nebulizer solution Take 3 mLs (2.5 mg  total) by nebulization every 4 (four) hours as needed for wheezing or shortness of breath. 02/10/19   Francine Graven, DO  albuterol (VENTOLIN HFA) 108 (90 Base) MCG/ACT inhaler Inhale 2 puffs into the lungs every 4 (four) hours as needed for wheezing or shortness of breath. 02/10/19   Francine Graven, DO  amoxicillin-clavulanate (AUGMENTIN) 875-125 MG tablet Take 1 tablet by mouth every 12 (twelve) hours.  06/14/22   Melynda Ripple, MD  aspirin EC 81 MG EC tablet Take 1 tablet (81 mg total) by mouth daily. 05/10/15   Gold, Wayne E, PA-C  azithromycin (ZITHROMAX) 250 MG tablet Take 1 tablet (250 mg total) by mouth daily. 2 tabs po on day 1, 1 tab po on days 2-5 06/14/22   Melynda Ripple, MD  budesonide-formoterol [facility]) 80-4.5 MCG/ACT inhaler Inhale 2 puffs into the lungs 2 (two) times daily. 11/15/17   [name], MD  carvedilol (COREG) 12.5 MG tablet Take 12.5 mg by mouth 2 (two) times daily.  10/12/16   [provider]  cetirizine (ZYRTEC) 10 MG tablet Take 1 tablet (10 mg total) by mouth daily. 05/28/18   [name], MD  Cholecalciferol (VITAMIN D3) 5000 units CAPS Take 5,000 Units by mouth daily.    [provider]  clopidogrel (PLAVIX) 75 MG tablet Take 75 mg by mouth daily. Patient not taking: Reported on 12/21/2021    [provider]  DULoxetine (CYMBALTA) 60 MG capsule Take 90 mg by mouth daily.    [provider]  ezetimibe (ZETIA) 10 MG tablet Take 10 mg by mouth at bedtime.     [provider]  fluticasone (FLONASE) 50 MCG/ACT nasal spray Place 2 sprays into both nostrils daily. 06/14/22   Melynda Ripple, MD  gemfibrozil (LOPID) 600 MG tablet Take 600 mg by mouth 2 (two) times daily before a meal.    [provider]  metFORMIN (GLUCOPHAGE) 1000 MG tablet Take 1,000 mg by mouth 2 (two) times daily with a meal.    [provider]  nitroGLYCERIN (NITROSTAT) 0.4 MG SL tablet Place 0.4 mg under the tongue every 5 (five) minutes as needed for chest pain.    [provider]  pantoprazole (PROTONIX) 40 MG tablet Take 1 tablet (40 mg total) by mouth 2 (two) times daily before a meal. Before breakfast and supper 01/25/18   Gatha Mayer, MD  predniSONE (DELTASONE) 50 MG tablet Take 1 tablet (50 mg total) by mouth daily with breakfast. 06/14/22   Melynda Ripple, MD  tamsulosin (FLOMAX) 0.4 MG CAPS capsule  Take 1 capsule (0.4 mg total) by mouth daily. 05/28/18   [name], MD  tetrahydrozoline 0.05 % ophthalmic solution Place 1-2 drops into both eyes 3 (three) times daily as needed (for dry/irritated eyes).    [provider]  tiotropium (SPIRIVA) 18 MCG inhalation capsule Place 18 mcg into inhaler and inhale daily as needed (for breathing).    [provider]  omeprazole (PRILOSEC) 20 MG capsule Take 20 mg by mouth daily.  01/25/18  [provider]  Allergies Benazepril, Niacin, Other, Quinolones, and Statins  Review of Systems Review of Systems  Cardiovascular:  Positive for chest pain.   As noted in HPI  Physical Exam Vital Signs  I have reviewed the triage vital signs BP (!) 156/78 (BP Location: Right Arm)   Pulse 66   Temp (!) 97.5 F (36.4 C) (Oral)   Resp 18   Ht 5\' 5"  (1.651 m)   Wt 70.8 kg   SpO2 100%   BMI 25.96 kg/m   Physical Exam Vitals reviewed.  Constitutional:      General: He is not in acute distress.    Appearance: He is well-developed. He is not diaphoretic.  HENT:     Head: Normocephalic and atraumatic.     Nose: Nose normal.  Eyes:     General: No scleral icterus.       Right eye: No discharge.        Left eye: No discharge.     Conjunctiva/sclera: Conjunctivae normal.     Pupils: Pupils are equal, round, and reactive to light.  Cardiovascular:     Rate and Rhythm: Normal rate and regular rhythm.     Heart sounds: No murmur heard.    No friction rub. No gallop.  Pulmonary:     Effort: Pulmonary effort is normal. No respiratory distress.     Breath sounds: No stridor. Examination of the right-lower field reveals rales. Examination of the left-lower field reveals rales. Rales present.  Abdominal:     General: There is no distension.     Palpations: Abdomen is soft.     Tenderness: There  is no abdominal tenderness.  Musculoskeletal:        General: No tenderness.     Cervical back: Normal range of motion and neck supple.  Skin:    General: Skin is warm and dry.     Findings: No erythema or rash.  Neurological:     Mental Status: He is alert and oriented to person, place, and time.     ED Results and Treatments Labs (all labs ordered are listed, but only abnormal results are displayed) Labs Reviewed  URINALYSIS, W/ REFLEX TO CULTURE (INFECTION SUSPECTED) - Abnormal; Notable for the following components:      Result Value   Glucose, UA >=500 (*)    All other components within normal limits  CBC - Abnormal; Notable for the following components:   Hemoglobin 17.2 (*)    HCT 52.7 (*)    All other components within normal limits  RESP PANEL BY RT-PCR (RSV, FLU A&B, COVID)  [id number]  BASIC METABOLIC PANEL  TROPONIN I (HIGH SENSITIVITY)  TROPONIN I (HIGH SENSITIVITY)                                                                                                                         EKG  EKG Interpretation  Date/Time:  Friday August 12 2022 22:59:37 EDT Ventricular Rate:  64 PR Interval:  246  QRS Duration: 90 QT Interval:  396 QTC Calculation: 408 R Axis:   47 Text Interpretation: Sinus rhythm with 1st degree A-V block with frequent Premature ventricular complexes Septal infarct , age undetermined Abnormal ECG When compared with ECG of 12-Aug-2022 20:42, PREVIOUS ECG IS PRESENT Confirmed by Addison Lank [phone]) on 08/12/2022 11:18:58 PM       Radiology No results found.  Medications Ordered in ED Medications  pantoprazole (PROTONIX) injection 40 mg (has no administration in time range)  sodium chloride 0.9 % bolus 1,000 mL (has no administration in time range)                                                                                                                                     Procedures Procedures  (including critical care  time)  Medical Decision Making / ED Course  Click here for ABCD2, HEART and other calculators  Medical Decision Making Amount and/or Complexity of Data Reviewed External Data Reviewed: labs and radiology.    Details: Noted in HPI Labs: ordered. Decision-making details documented in ED Course. Radiology: ordered and independent interpretation performed. Decision-making details documented in ED Course. ECG/medicine tests: ordered and independent interpretation performed. Decision-making details documented in ED Course.          Final Clinical Impression(s) / ED Diagnoses Final diagnoses:  None    {Document critical care time when appropriate:1}  {Document review of labs and clinical decision tools ie heart score, Chads2Vasc2 etc:1}  {Document your independent review of radiology images, and any outside records:1} {Document your discussion with family members, caretakers, and with consultants:1} {Document social determinants of health affecting pt's care:1} {Document your decision making why or why not admission, treatments were needed:1} This chart was dictated using voice recognition software.  Despite best efforts to proofread,  errors can occur which can change the documentation meaning.

## 2022-08-12 NOTE — ED Provider Triage Note (Signed)
Emergency Medicine Provider Triage Evaluation Note  Eduardo [name] , a 77 y.o. male  was evaluated in triage.  Pt complains of substernal and left-sided chest pain.  Began yesterday and has gotten worse.  Took a nitro today with no relief.  Also reports worsening lower extremity weakness.  States he always has shortness of breath.  Has gotten slightly worse recently.  Denies cough.  Has some dizziness when changing positions.  Denies nausea, vomiting, diaphoresis.  Pain is nonexertional.  He is very concerned about his 4.9 cm aortic aneurysm  Review of Systems  Positive: As above Negative: As above  Physical Exam  BP (!) 156/78 (BP Location: Right Arm)   Pulse 66   Temp (!) 97.5 F (36.4 C) (Oral)   Resp 18   Ht 5\' 5"  (1.651 m)   Wt 70.8 kg   SpO2 100%   BMI 25.96 kg/m  Gen:   Awake, no distress   Resp:  Normal effort  MSK:   Moves extremities without difficulty  Other:    Medical Decision Making  Medically screening exam initiated at 8:50 PM.  Appropriate orders placed.  [name] was informed that the remainder of the evaluation will be completed by another provider, this initial triage assessment does not replace that evaluation, and the importance of remaining in the ED until their evaluation is complete.  ACS rule out initiated   Nehemiah Massed 08/12/22 2051

## 2022-08-12 NOTE — ED Triage Notes (Signed)
Minimal retrosternal chest pains beginning yesterday around noon worsening today. Pain now radiates to left chest and describes pressure. [id number] with exertion.   Presented to [facility] where EKG/ Labs/ CXR were done. Eloped from waiting area and presented here.   Hx AAA ~4cm. Pt concerned it is worsening.

## 2022-08-13 DIAGNOSIS — R0789 Other chest pain: Secondary | ICD-10-CM | POA: Diagnosis not present

## 2022-08-13 LAB — RESP PANEL BY RT-PCR (RSV, FLU A&B, COVID)  RVPGX2
Influenza A by PCR: NEGATIVE
Influenza B by PCR: NEGATIVE
Resp Syncytial Virus by PCR: NEGATIVE
SARS Coronavirus 2 by RT PCR: NEGATIVE

## 2022-08-13 MED ORDER — LIDOCAINE VISCOUS HCL 2 % MT SOLN: 10.0000 mL | Freq: Four times a day (QID) | OROMUCOSAL | 0 refills | Status: DC | PRN

## 2022-08-13 MED ORDER — ALUM & MAG HYDROXIDE-SIMETH 200-200-20 MG/5ML PO SUSP
30.0000 mL | Freq: Once | ORAL | Status: AC
  Administered 2022-08-13: 30 mL via ORAL
  Filled 2022-08-13: qty 30

## 2022-08-13 MED ORDER — IOHEXOL 350 MG/ML SOLN
100.0000 mL | Freq: Once | INTRAVENOUS | Status: AC | PRN
  Administered 2022-08-13: 100 mL via INTRAVENOUS

## 2022-08-13 MED ORDER — PANTOPRAZOLE SODIUM 40 MG PO TBEC: 40.0000 mg | DELAYED_RELEASE_TABLET | Freq: Two times a day (BID) | ORAL | 0 refills | Status: AC

## 2022-08-13 MED ORDER — LIDOCAINE VISCOUS HCL 2 % MT SOLN
15.0000 mL | Freq: Once | OROMUCOSAL | Status: AC
  Administered 2022-08-13: 15 mL via ORAL
  Filled 2022-08-13: qty 15

## 2022-08-13 MED ORDER — MAALOX MAX 400-400-40 MG/5ML PO SUSP: 10.0000 mL | Freq: Four times a day (QID) | ORAL | 0 refills | Status: AC | PRN

## 2022-08-13 MED ORDER — POLYETHYLENE GLYCOL 3350 17 GM/SCOOP PO POWD: ORAL | 0 refills | Status: AC

## 2022-08-13 MED ORDER — LIDOCAINE VISCOUS HCL 2 % MT SOLN: 10.0000 mL | Freq: Four times a day (QID) | OROMUCOSAL | 0 refills | Status: AC | PRN

## 2022-08-13 MED ORDER — POLYETHYLENE GLYCOL 3350 17 GM/SCOOP PO POWD: ORAL | 0 refills | Status: DC

## 2022-08-13 MED ORDER — PANTOPRAZOLE SODIUM 40 MG PO TBEC: 40.0000 mg | DELAYED_RELEASE_TABLET | Freq: Two times a day (BID) | ORAL | 0 refills | Status: DC

## 2022-08-13 MED ORDER — MAALOX MAX 400-400-40 MG/5ML PO SUSP: 10.0000 mL | Freq: Four times a day (QID) | ORAL | 0 refills | Status: DC | PRN

## 2022-08-13 NOTE — ED Notes (Signed)
Pt requesting prescriptions to be sent to [facility] in [place].  Provider notified of same.

## 2022-08-13 NOTE — ED Notes (Signed)
Pt provided with AVS.  Education complete; all questions answered.  Pt leaving ED in stable condition at this time, ambulatory with all belongings. 

## 2022-11-03 ENCOUNTER — Other Ambulatory Visit: Payer: Self-pay | Admitting: Thoracic Surgery (Cardiothoracic Vascular Surgery)

## 2022-11-03 DIAGNOSIS — I712 Thoracic aortic aneurysm, without rupture, unspecified: Secondary | ICD-10-CM

## 2022-11-03 DIAGNOSIS — I714 Abdominal aortic aneurysm, without rupture, unspecified: Secondary | ICD-10-CM

## 2022-11-29 ENCOUNTER — Ambulatory Visit
Admission: RE | Admit: 2022-11-29 | Discharge: 2022-11-29 | Disposition: A | Discharge status: Home / Self Care | Payer: Medicare Other | Source: Ambulatory Visit | Attending: Thoracic Surgery (Cardiothoracic Vascular Surgery) | Admitting: Thoracic Surgery (Cardiothoracic Vascular Surgery)

## 2022-11-29 DIAGNOSIS — I714 Abdominal aortic aneurysm, without rupture, unspecified: Secondary | ICD-10-CM

## 2022-12-12 ENCOUNTER — Ambulatory Visit: Admission: RE | Admit: 2022-12-12 | Payer: Medicare Other | Source: Ambulatory Visit

## 2022-12-27 ENCOUNTER — Ambulatory Visit
Admission: RE | Admit: 2022-12-27 | Discharge: 2022-12-27 | Disposition: A | Discharge status: Home / Self Care | Payer: Medicare Other | Source: Ambulatory Visit | Attending: Thoracic Surgery (Cardiothoracic Vascular Surgery) | Admitting: Thoracic Surgery (Cardiothoracic Vascular Surgery)

## 2022-12-27 ENCOUNTER — Encounter: Payer: Self-pay | Admitting: Thoracic Surgery (Cardiothoracic Vascular Surgery)

## 2022-12-27 ENCOUNTER — Ambulatory Visit (INDEPENDENT_AMBULATORY_CARE_PROVIDER_SITE_OTHER): Payer: Medicare Other | Admitting: Thoracic Surgery (Cardiothoracic Vascular Surgery)

## 2022-12-27 VITALS — BP 142/81 | HR 74 | Resp 20 | Ht 65.0 in | Wt 161.0 lb

## 2022-12-27 DIAGNOSIS — I712 Thoracic aortic aneurysm, without rupture, unspecified: Secondary | ICD-10-CM | POA: Diagnosis not present

## 2022-12-27 MED ORDER — IOPAMIDOL (ISOVUE-370) INJECTION 76%
75.0000 mL | Freq: Once | INTRAVENOUS | Status: AC | PRN
  Administered 2022-12-27: 75 mL via INTRAVENOUS

## 2022-12-27 NOTE — Progress Notes (Signed)
[address]       [name] [address]             [phone]     HPI: [name] and returns for follow-up of his ascending aneurysm.  Eduardo [name] is a 77 year old gentleman with a history of CAD, CABG, mild carotid disease, Agent Orange exposure, COPD, diabetes complicated by neuropathy, hypertension, hyperlipidemia, reflux, peripheral neuropathy, PTSD, sleep apnea, and aortic root and ascending aneurysms.  He had coronary bypass grafting by Dr. Tyrone Sage who was then following his aneurysm.  I saw him in the office a year ago.  His aortic root measured around 4.7 to 4.8 cm.  There was a 4.2 cm ascending aneurysm.  Back in March he had an episode of chest pain.  He went to the emergency room in [place] but they refused to do a CT so he drove to [place].  CT angiogram showed no acute aortic pathology.  Since then he has been feeling well with no further episodes of chest pain.  Does not check his blood pressure on a regular basis.  Past Medical History:  Diagnosis Date   Arthritis    Bilateral carotid artery disease ([facility]) 04/28/2015   1-39 percent bilateral stenosis noted on Doppler    CAD (coronary artery disease)    a. s/p CABG in 2016 with LIMA-LAD, reverse SVG-D2, reverse SVG-OM1, and reverse SVG-PDA   Cervical disc disease    Chemical exposure    agent orange    Colon polyps    COPD (chronic obstructive pulmonary disease) ([facility])    Diabetes mellitus without complication ([facility])    Diabetic neuropathy ([facility]) 03/07/2018   Diverticulitis    Gallstones    GERD (gastroesophageal reflux disease)    Hyperlipidemia    Hypertension    Lumbar disc disease    Multiple gastric ulcers    Obesity (BMI 30-39.9)    Peripheral neuropathy    in all extremities   PTSD (post-traumatic stress disorder)    Sleep apnea    can't wear cpap   Thoracic ascending aortic aneurysm ([facility])     Current Outpatient Medications  Medication Sig Dispense Refill    albuterol (PROVENTIL HFA;VENTOLIN HFA) 108 (90 BASE) MCG/ACT inhaler Inhale 1 puff into the lungs every 6 (six) hours as needed for wheezing or shortness of breath.      albuterol (PROVENTIL) (2.5 MG/3ML) 0.083% nebulizer solution Take 3 mLs (2.5 mg total) by nebulization every 4 (four) hours as needed for wheezing or shortness of breath. 75 mL 0   albuterol (VENTOLIN HFA) 108 (90 Base) MCG/ACT inhaler Inhale 2 puffs into the lungs every 4 (four) hours as needed for wheezing or shortness of breath. 8 g 0   alum & mag hydroxide-simeth (MAALOX MAX) 400-400-40 MG/5ML suspension Take 10 mLs by mouth every 6 (six) hours as needed for indigestion. 355 mL 0   amoxicillin-clavulanate (AUGMENTIN) 875-125 MG tablet Take 1 tablet by mouth every 12 (twelve) hours. 14 tablet 0   aspirin EC 81 MG EC tablet Take 1 tablet (81 mg total) by mouth daily.     azithromycin (ZITHROMAX) 250 MG tablet Take 1 tablet (250 mg total) by mouth daily. 2 tabs po on day 1, 1 tab po on days 2-5 6 tablet 0   budesonide-formoterol (SYMBICORT) 80-4.5 MCG/ACT inhaler Inhale 2 puffs into the lungs 2 (two) times daily. 1 Inhaler 3   carvedilol (COREG) 12.5 MG tablet Take 12.5 mg by mouth 2 (  two) times daily.      cetirizine (ZYRTEC) 10 MG tablet Take 1 tablet (10 mg total) by mouth daily. 30 tablet 11   Cholecalciferol (VITAMIN D3) 5000 units CAPS Take 5,000 Units by mouth daily.     clopidogrel (PLAVIX) 75 MG tablet Take 75 mg by mouth daily.     DULoxetine (CYMBALTA) 60 MG capsule Take 90 mg by mouth daily.     ezetimibe (ZETIA) 10 MG tablet Take 10 mg by mouth at bedtime.      fluticasone (FLONASE) 50 MCG/ACT nasal spray Place 2 sprays into both nostrils daily. 16 g 0   gemfibrozil (LOPID) 600 MG tablet Take 600 mg by mouth 2 (two) times daily before a meal.     lidocaine (XYLOCAINE) 2 % solution Use as directed 10 mLs in the mouth or throat every 6 (six) hours as needed (stomach pain). 100 mL 0   metFORMIN (GLUCOPHAGE) 1000 MG  tablet Take 1,000 mg by mouth 2 (two) times daily with a meal.     nitroGLYCERIN (NITROSTAT) 0.4 MG SL tablet Place 0.4 mg under the tongue every 5 (five) minutes as needed for chest pain.     pantoprazole (PROTONIX) 40 MG tablet Take 1 tablet (40 mg total) by mouth 2 (two) times daily before a meal. Before breakfast and supper 180 tablet 0   polyethylene glycol powder (MIRALAX) 17 GM/SCOOP powder Please start taking 1 capful 3 times a day. Slowly cut back as needed until you have normal bowel movements. 255 g 0   predniSONE (DELTASONE) 50 MG tablet Take 1 tablet (50 mg total) by mouth daily with breakfast. 5 tablet 0   tamsulosin (FLOMAX) 0.4 MG CAPS capsule Take 1 capsule (0.4 mg total) by mouth daily. 90 capsule 3   tetrahydrozoline 0.05 % ophthalmic solution Place 1-2 drops into both eyes 3 (three) times daily as needed (for dry/irritated eyes).     tiotropium (SPIRIVA) 18 MCG inhalation capsule Place 18 mcg into inhaler and inhale daily as needed (for breathing).     No current facility-administered medications for this visit.    Physical Exam BP (!) 142/81 (BP Location: Left Arm, Patient Position: Sitting)   Pulse 74   Resp 20   Ht 5\' 5"  (1.651 m)   Wt 161 lb (73 kg)   SpO2 93% Comment: RA  BMI 26.63 kg/m  77 year old man in no acute distress Alert and oriented x 3 with no focal deficit Lungs diminished but otherwise clear breath sounds bilaterally Cardiac regular rate and rhythm with a faint systolic murmur No peripheral edema  Diagnostic Tests: CT ANGIOGRAPHY CHEST WITH CONTRAST   TECHNIQUE: Multidetector CT imaging of the chest was performed using the standard protocol during bolus administration of intravenous contrast. Multiplanar CT image reconstructions and MIPs were obtained to evaluate the vascular anatomy.   RADIATION DOSE REDUCTION: This exam was performed according to the departmental dose-optimization program which includes automated exposure control,  adjustment of the mA and/or kV according to patient size and/or use of iterative reconstruction technique.   CONTRAST:  75mL ISOVUE-370 IOPAMIDOL (ISOVUE-370) INJECTION 76%   COMPARISON:  08/12/2022   FINDINGS: Cardiovascular: Preferential opacification of the thoracic aorta. Unchanged enlargement of the tubular ascending thoracic aorta, measuring up to 4.2 x 4.2 cm in caliber. Enlargement of the sinuses of Valsalva measuring up to 5.0 x 4.9 cm. Evaluation of the aortic valve limited by motion artifact but a proximally 3.0 cm in caliber. Descending thoracic aorta measures up to 3.0  x 2.9 cm. Mild mixed calcific atherosclerosis. Normal heart size. Three-vessel coronary artery calcifications status post median sternotomy and CABG. No pericardial effusion.   Mediastinum/Nodes: No enlarged mediastinal, hilar, or axillary lymph nodes. Thyroid gland, trachea, and esophagus demonstrate no significant findings.   Lungs/Pleura: Moderate centrilobular and paraseptal emphysema. Diffuse bilateral bronchial wall thickening. Dependent bibasilar scarring or atelectasis. No pleural effusion or pneumothorax.   Upper Abdomen: No acute abnormality.  Cholelithiasis.   Musculoskeletal: No chest wall abnormality. No acute osseous findings.   Review of the MIP images confirms the above findings.   IMPRESSION: 1. Unchanged enlargement of the tubular ascending thoracic aorta, measuring up to 4.2 x 4.2 cm in caliber. 2. Enlargement of the sinuses of Valsalva measuring up to 5.0 x 4.9 cm. 3. Coronary artery disease status post median sternotomy and CABG. 4. Emphysema and diffuse bilateral bronchial wall thickening. 5. Cholelithiasis.   Aortic Atherosclerosis (ICD10-I70.0) and Emphysema (ICD10-J43.9).     Electronically Signed   By: Jearld Lesch M.D.   On: 12/27/2022 14:33   I personally reviewed the CT images.  There is enlargement of the sinuses of Valsalva and about 4.9 to 5 cm.  Stable from  previous scan.  Mid ascending aortic aneurysm 4.2 cm, stable. Previous CABG.  Impression: Marino Hargreaves is a 77 year old gentleman with a history of CAD, CABG, mild carotid disease, Agent Orange exposure, COPD, diabetes complicated by neuropathy, hypertension, hyperlipidemia, reflux, peripheral neuropathy, PTSD, sleep apnea, and aortic root and ascending aneurysms.    Aortic root and ascending aortic aneurysms-aneurysm measures approximately 4.9 to 5 cm in the aortic root.  Not appreciably changed from previous scans although it measures slightly different.  Difficult area to get a precise measurement.  No indication for surgery but does need continued follow-up.  Ascending aneurysm aneurysmal at about 4.2 cm at the level of the right pulmonary artery.  CAD, previous CABG by Dr. Lance Bosch of chest pain back in March but no cardiac source.  Not having any anginal pain at present.  Hypertension-blood pressure mildly elevated.  He says been checked multiple times with physicians recently and his blood pressures been under control.  Could be due to diet.  I recommended that he get a cuff and check himself at home at least weekly basis.  Plan: Return in 6 months with 2D echocardiogram and CT angiogram of chest  Loreli Slot, MD [facility] [phone]

## 2023-05-09 ENCOUNTER — Other Ambulatory Visit: Payer: Self-pay | Admitting: Thoracic Surgery (Cardiothoracic Vascular Surgery)

## 2023-05-09 DIAGNOSIS — I712 Thoracic aortic aneurysm, without rupture, unspecified: Secondary | ICD-10-CM

## 2023-05-23 ENCOUNTER — Ambulatory Visit
Admission: EM | Admit: 2023-05-23 | Discharge: 2023-05-23 | Disposition: A | Discharge status: Home / Self Care | Payer: Medicare Other | Attending: Family Medicine | Admitting: Family Medicine

## 2023-05-23 ENCOUNTER — Ambulatory Visit (INDEPENDENT_AMBULATORY_CARE_PROVIDER_SITE_OTHER): Payer: Medicare Other

## 2023-05-23 DIAGNOSIS — M25551 Pain in right hip: Secondary | ICD-10-CM

## 2023-05-23 MED ORDER — KETOROLAC TROMETHAMINE 30 MG/ML IJ SOLN
30.0000 mg | Freq: Once | INTRAMUSCULAR | Status: AC
  Administered 2023-05-23: 30 mg via INTRAMUSCULAR

## 2023-05-23 MED ORDER — DEXAMETHASONE SODIUM PHOSPHATE 10 MG/ML IJ SOLN
10.0000 mg | Freq: Once | INTRAMUSCULAR | Status: AC
  Administered 2023-05-23: 10 mg via INTRAMUSCULAR

## 2023-05-23 MED ORDER — HYDROCODONE-ACETAMINOPHEN 5-325 MG PO TABS: 1.0000 | ORAL_TABLET | Freq: Four times a day (QID) | ORAL | 0 refills | Status: AC | PRN

## 2023-05-23 MED ORDER — KETOROLAC TROMETHAMINE 30 MG/ML IJ SOLN: 30.0000 mg | Freq: Once | INTRAMUSCULAR | Status: DC

## 2023-05-23 NOTE — ED Provider Notes (Signed)
[facility]   [id number] 05/23/23 Arrival Time: 0932  ASSESSMENT & PLAN:  1. Right hip pain    I have personally viewed and independently interpreted the imaging studies ordered this visit. R hip/pelvis 1 view: no acute bony changes appreciated.  Meds ordered this encounter  Medications   ketorolac (TORADOL) 30 MG/ML injection 30 mg   dexamethasone (DECADRON) injection 10 mg   HYDROcodone-acetaminophen (NORCO/VICODIN) 5-325 MG tablet    Sig: Take 1 tablet by mouth every 6 (six) hours as needed for moderate pain (pain score 4-6) or severe pain (pain score 7-10).    Dispense:  10 tablet    Refill:  0   Activities as tolerated. Recommend:  Follow-up Information     Schedule an appointment as soon as possible for a visit  with Woodroe Chen, MD.   Specialty: Internal Medicine Why: If worsening or failing to improve as anticipated. Contact information: [address] [place] [address] [phone]                Reviewed expectations re: course of current medical issues. Questions answered. Outlined signs and symptoms indicating need for more acute intervention. Patient verbalized understanding. After Visit Summary given.  SUBJECTIVE: History from: patient. Eduardo [name] is a 77 y.o. male who reports R hip pain; abrupt onset; noted yesterday evening; initially lateral R hip; feels some pain in groin and radiating down leg this am. No pain when sedentary. Pain always with movement/ambulation. No extremity sensation changes or weakness. Normal bowel/bladder habits. OTC pain cream without relief PTA.  Past Surgical History:  Procedure Laterality Date   ANAL RECTAL MANOMETRY N/A 02/05/2018   Procedure: ANO RECTAL MANOMETRY;  Surgeon: Napoleon Form, MD;  Location: [facility] ENDOSCOPY;  Service: Endoscopy;  Laterality: N/A;   CARDIAC CATHETERIZATION     CARDIAC CATHETERIZATION  03/18/14   difficult to determine culprit vesel   CARDIAC  CATHETERIZATION N/A 04/29/2015   Procedure: Left Heart Cath and Coronary Angiography;  Surgeon: Corky Crafts, MD;  Location: [facility] INVASIVE CV LAB;  Service: Cardiovascular;  Laterality: N/A;   CARPOMETACARPAL ([facility]) FUSION OF THUMB Right 08/04/2016   Procedure: CARPOMETACARPAL [facility]) FUSION OF THUMB;  Surgeon: Bradly Bienenstock, MD;  Location: [facility] OR;  Service: Orthopedics;  Laterality: Right;   CERVICAL SPINE SURGERY     COLONOSCOPY N/A 04/05/2017   Procedure: COLONOSCOPY;  Surgeon: Malissa Hippo, MD;  Location: [facility] ENDO SU[facility];  Service: Endoscopy;  Laterality: N/A;  830   CORONARY ARTERY BYPASS GRAFT N/A 05/04/2015   Procedure: CORONARY ARTERY BYPASS GRAFTING (CABG)  x four,  using left internal mammary artery, and bilateral thigh greater saphenous veins;  Surgeon: Delight Ovens, MD;  Location: [facility] OR;  Service: Open Heart Surgery;  Laterality: N/A;   EYE SURGERY Bilateral    cataract   FOOT SURGERY Left    "little toe"   I & D EXTREMITY Left 05/30/2013   Procedure: IRRIGATION AND DEBRIDEMENT Left Hand and Foreign body removal;  Surgeon: Sheral Apley, MD;  Location: [facility] OR;  Service: Orthopedics;  Laterality: Left;   LEFT HEART CATHETERIZATION WITH CORONARY ANGIOGRAM N/A 03/18/2014   Procedure: LEFT HEART CATHETERIZATION WITH CORONARY ANGIOGRAM;  Surgeon: Peter M Swaziland, MD;  Location: [facility] CATH LAB;  Service: Cardiovascular;  Laterality: N/A;   LUMBAR LAMINECTOMY     PARTIAL COLECTOMY     For diverticulitis   PTCA     unsuccesful   REPLACEMENT TOTAL KNEE     right  SHOULDER SURGERY Bilateral    x 2 (on both shoulder)_   TEE WITHOUT CARDIOVERSION N/A 05/04/2015   Procedure: TRANSESOPHAGEAL ECHOCARDIOGRAM (TEE);  Surgeon: Delight Ovens, MD;  Location: [facility] OR;  Service: Open Heart Surgery;  Laterality: N/A;   thumb surgery Left       OBJECTIVE:  Vitals:   05/23/23 1017 05/23/23 1018  BP:  (!) 157/99  Pulse:  60  Resp:  16  Temp:  (!) 97.5 F (36.4 C)  TempSrc:  Oral  SpO2:   99%  Weight: 69.9 kg   Height: 5\' 5"  (1.651 m)     General appearance: alert; no distress HEENT: [place]; AT Neck: supple with FROM Resp: unlabored respirations Extremities: RLE: warm with well perfused appearance; is TTP over R lateral hip over trochanteric bursa but SLR also reproduces pain that shoots down leg; without gross deformities; swelling: none; bruising: none; R hip and knee with FROM CV: brisk extremity capillary refill of RLE; 2+ DP pulse of RLE. Skin: warm and dry; no visible rashes Neurologic: normal sensation and strength of RLE Psychological: alert and cooperative; normal mood and affect  Imaging:. No results found.     Allergies  Allergen Reactions   Benazepril     hyperkalemia  Other Reaction(s): Agitation, Mental Status Changes (intolerance)  hyperkalemia  hyperkalemia  hyperkalemia hyperkalemia    hyperkalemia   Niacin Other (See Comments) and Hives    Other Reaction(s): Redness   Other Other (See Comments)    Other Reaction(s): Myalgias (intolerance), Other (See Comments), Other (See Comments)  Muscle soreness   Muscle aches  Body aches    Muscle aches   Quinolones Hives    Patient was warned about not using Cipro and similar antibiotics.  Recent studies have raised concern that fluoroquinolone antibiotics could be associated with an increased risk of aortic aneurysm  Fluoroquinolones have non-antimicrobial properties that might jeopardise the integrity of the extracellular matrix of the vascular wall  In a  propensity score matched cohort study in [place], there was a 66% increased rate of aortic aneurysm or dissection associated with oral fluoroquinolone use, compared wit  Patient was warned about not using Cipro and similar antibiotics.  Recent studies have raised concern that fluoroquinolone antibiotics could be associated with an increased risk of aortic aneurysm  Fluoroquinolones have non-antimicrobial properties that might jeopardise  the integrity of the extracellular matrix of the vascular wall  In a  propensity score matched cohort study in [place], there was a 66% increased rate of aortic aneurysm or dissection associated with oral fluoroquinolone use, compared wit  Patient was warned about not using Cipro and similar antibiotics.  Recent studies have raised concern that fluoroquinolone antibiotics could be associated with an increased risk of aortic aneurysm  Fluoroquinolones have non-antimicrobial properties that might jeopardise the integrity of the extracellular matrix of the vascular wall  In a  propensity score matched cohort study in [place], there was a 66% increased rate of aortic aneurysm or dissection associated with oral fluoroquinolone use, compared wit  Patient was warned about not using Cipro and similar antibiotics.  Recent studies have raised concern that fluoroquinolone antibiotics could be associated with an increased risk of aortic aneurysm  Fluoroquinolones have non-antimicrobial properties that might jeopardise the integrity of the extracellular matrix of the vascular wall  In a  propensity score matched cohort study in [place], there was a 66% increased rate of aortic aneurysm or dissection associated with oral fluoroquinolone use, compared wit  Patient was  warned about not using Cipro and similar antibiotics. Recent studies have raised concern that fluoroquinolone antibiotics could be associated with an increased risk of aortic aneurysm Fluoroquinolones have non-antimicrobial properties that might jeopardise the integrity of the extracellular matrix of the vascular wall In a  propensity score matched cohort study in [place], there was a 66% increased rate of aortic aneurysm or dissection associated with oral fluoroquinolone use, compared wit Patient was warned about not using Cipro and similar antibiotics. Recent studies have raised concern that fluoroquinolone antibiotics could be associated with an increased  risk of aortic aneurysm Fluoroquinolones have non-antimicrobial properties that might jeopardise the integrity of the extracellular matrix of the vascular wall In a  propensity score matched cohort study in [place], there was a 66% increased rate of aortic aneurysm or dissection associated with oral fluoroquinolone use, compared wit    Patient was warned about not using Cipro and similar antibiotics. Recent studies have raised concern that fluoroquinolone antibiotics could be associated with an increased risk of aortic aneurysm Fluoroquinolones have non-antimicrobial properties that might jeopardise the integrity of the extracellular matrix of the vascular wall In a  propensity score matched cohort study in [place], there was a 66% increased rate of aortic aneurysm or dissection associated with oral fluoroquinolone use, compared wit   Statins Other (See Comments)    Body aches  Other Reaction(s): Muscle pain, Myalgia, Myalgia, Myalgias, Other    Past Medical History:  Diagnosis Date   Arthritis    Bilateral carotid artery disease (HCC) 04/28/2015   1-39 percent bilateral stenosis noted on Doppler    CAD (coronary artery disease)    a. s/p CABG in 2016 with LIMA-LAD, reverse SVG-D2, reverse SVG-OM1, and reverse SVG-PDA   Cervical disc disease    Chemical exposure    agent orange    Colon polyps    COPD (chronic obstructive pulmonary disease) (HCC)    Diabetes mellitus without complication (HCC)    Diabetic neuropathy (HCC) 03/07/2018   Diverticulitis    Gallstones    GERD (gastroesophageal reflux disease)    Hyperlipidemia    Hypertension    Lumbar disc disease    Multiple gastric ulcers    Obesity (BMI 30-39.9)    Peripheral neuropathy    in all extremities   PTSD (post-traumatic stress disorder)    Sleep apnea    can't wear cpap   Thoracic ascending aortic aneurysm (HCC)    Social History   Socioeconomic History   Marital status: Married    Spouse name: Flo   Number of  children: 6   Years of education: Not on file   Highest education level: 12th grade  Occupational History   Occupation: retired  Tobacco Use   Smoking status: Former    Current packs/day: 0.00    Average packs/day: 0.8 packs/day for 30.0 years (22.5 ttl pk-yrs)    Types: Cigarettes    Start date: 21    Quit date: 05/31/1987    Years since quitting: 36.0   Smokeless tobacco: Never  Vaping Use   Vaping status: Never Used  Substance and Sexual Activity   Alcohol use: No    Alcohol/week: 0.0 standard drinks of alcohol   Drug use: No   Sexual activity: Yes  Other Topics Concern   Not on file  Social History Narrative   Patient is married.  5 sons one daughter.  They are scattered throughout the country.  17 grandchildren.   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      For habits he quit smoking in 1989.  3 caffeinated beverages a day no alcohol or drug use.  No smokeless tobacco.   Left handed (states he can use both if needed)   Caffeine 3 cups per day     Social Drivers of Health   Financial Resource Strain: Low Risk  (02/28/2023)   Received from [facility]   Overall Financial Resource Strain (CARDIA)    Difficulty of Paying Living Expenses: Not hard at all  Food Insecurity: No Food Insecurity (05/17/2023)   Received from [facility]   Hunger Vital Sign    Worried About Running Out of Food in the Last Year: Never true    Ran Out of Food in the Last Year: Never true  Transportation Needs: No Transportation Needs (05/17/2023)   Received from [facility] - Transportation    Lack of Transportation (Medical): No    Lack of Transportation (Non-Medical): No  Physical Activity: Sufficiently Active (02/28/2023)   Received from [facility]   Exercise Vital Sign    Days of Exercise per Week: 7 days    Minutes of Exercise per Session: 50 min  Stress: No Stress Concern Present (05/17/2023)    Received from [facility] - Occupational Stress Questionnaire    Feeling of Stress : Only a little  Social Connections: Socially Integrated (02/28/2023)   Received from [employer]   Social Network    How would you rate your social network (family, work, friends)?: Good participation with social networks   Family History  Problem Relation Age of Onset   Diabetes Father    Heart disease Father    Hypertension Father    Diabetes Paternal Grandmother    Heart disease Paternal Grandmother    Hypertension Paternal Grandmother    Diabetes Paternal Uncle    Heart disease Paternal Uncle    Heart disease Paternal Uncle    Hypertension Paternal Uncle    Heart disease Paternal Uncle    Hypertension Mother    Heart disease Mother    Drug abuse Son    Colon cancer Neg Hx    Esophageal cancer Neg Hx    Pancreatic cancer Neg Hx    Stomach cancer Neg Hx    Liver disease Neg Hx    Past Surgical History:  Procedure Laterality Date   ANAL RECTAL MANOMETRY N/A 02/05/2018   Procedure: ANO RECTAL MANOMETRY;  Surgeon: Napoleon Form, MD;  Location: [facility] END[facility];  Service: Endoscopy;  Laterality: N/A;   CARDIAC CATHETERIZATION     CARDIAC CATHETERIZATION  03/18/14   difficult to determine culprit vesel   CARDIAC CATHETERIZATION N/A 04/29/2015   Procedure: Left Heart Cath and Coronary Angiography;  Surgeon: Corky Crafts, MD;  Location: [facility] INVASIVE CV LAB;  Service: Cardiovascular;  Laterality: N/A;   CARPOMETACARPAL ([facility]) FUSION OF THUMB Right 08/04/2016   Procedure: CARPOMETACARPAL [facility]) FUSION OF THUMB;  Surgeon: Bradly Bienenstock, MD;  Location: [facility] OR;  Service: Orthopedics;  Laterality: Right;   CERVICAL SPINE SURGERY     COLONOSCOPY N/A 04/05/2017   Procedure: COLONOSCOPY;  Surgeon: Malissa Hippo, MD;  [facility];  Service: Endoscopy;  Laterality: N/A;  830   CORONARY ARTERY BYPASS GRAFT N/A 05/04/2015   Procedure: CORONARY  ARTERY BYPASS GRAFTING (CABG)  x four,  using left  internal mammary artery, and bilateral thigh greater saphenous veins;  Surgeon: Delight Ovens, MD;  Location: [facility] OR;  Service: Open Heart Surgery;  Laterality: N/A;   EYE SURGERY Bilateral    cataract   FOOT SURGERY Left    "little toe"   I & D EXTREMITY Left 05/30/2013   Procedure: IRRIGATION AND DEBRIDEMENT Left Hand and Foreign body removal;  Surgeon: Sheral Apley, MD;  Location: [facility] OR;  Service: Orthopedics;  Laterality: Left;   LEFT HEART CATHETERIZATION WITH CORONARY ANGIOGRAM N/A 03/18/2014   Procedure: LEFT HEART CATHETERIZATION WITH CORONARY ANGIOGRAM;  Surgeon: Peter M Swaziland, MD;  Location: [facility] CATH LAB;  Service: Cardiovascular;  Laterality: N/A;   LUMBAR LAMINECTOMY     PARTIAL COLECTOMY     For diverticulitis   PTCA     unsuccesful   REPLACEMENT TOTAL KNEE     right    SHOULDER SURGERY Bilateral    x 2 (on both shoulder)_   TEE WITHOUT CARDIOVERSION N/A 05/04/2015   Procedure: TRANSESOPHAGEAL ECHOCARDIOGRAM (TEE);  Surgeon: Delight Ovens, MD;  Location: [facility] OR;  Service: Open Heart Surgery;  Laterality: N/A;   thumb surgery Left        Mardella Layman, MD 05/23/23 1128

## 2023-05-23 NOTE — ED Triage Notes (Signed)
Patient presents with right leg pain x day 2. Treated with a pain cream without relief.

## 2023-05-23 NOTE — Discharge Instructions (Signed)
Be aware, you have been prescribed pain medications that may cause drowsiness. While taking this medication, do not take any other medications containing acetaminophen (Tylenol). Do not combine with alcohol or recreational drugs. Please do not drive, operate heavy machinery, or take part in activities that require making important decisions while on this medication as your judgement may be clouded.  Be aware, you blood sugars will run higher than normal secondary to the steroid shot we have given you today.  Meds ordered this encounter  Medications   ketorolac (TORADOL) 30 MG/ML injection 30 mg   dexamethasone (DECADRON) injection 10 mg   HYDROcodone-acetaminophen (NORCO/VICODIN) 5-325 MG tablet    Sig: Take 1 tablet by mouth every 6 (six) hours as needed for moderate pain (pain score 4-6) or severe pain (pain score 7-10).    Dispense:  10 tablet    Refill:  0

## 2023-06-20 ENCOUNTER — Ambulatory Visit
Admission: RE | Admit: 2023-06-20 | Discharge: 2023-06-20 | Disposition: A | Discharge status: Home / Self Care | Payer: Medicare Other | Source: Ambulatory Visit | Attending: Thoracic Surgery (Cardiothoracic Vascular Surgery) | Admitting: Thoracic Surgery (Cardiothoracic Vascular Surgery)

## 2023-06-20 ENCOUNTER — Encounter: Payer: Self-pay | Admitting: Thoracic Surgery (Cardiothoracic Vascular Surgery)

## 2023-06-20 ENCOUNTER — Ambulatory Visit: Payer: Medicare Other | Admitting: Thoracic Surgery (Cardiothoracic Vascular Surgery)

## 2023-06-20 DIAGNOSIS — I712 Thoracic aortic aneurysm, without rupture, unspecified: Secondary | ICD-10-CM

## 2023-06-20 MED ORDER — IOPAMIDOL (ISOVUE-370) INJECTION 76%
75.0000 mL | Freq: Once | INTRAVENOUS | Status: AC | PRN
  Administered 2023-06-20: 75 mL via INTRAVENOUS

## 2023-07-18 NOTE — Progress Notes (Signed)
 [address]       [place] [address]             [phone]    Eduardo [name] [id number] [birth year]  History of Present Illness:  Eduardo [name] is a 78 yo male known to [facility].  He is S/P CABG performed by Dr. Tyrone Sage back in 2016.  He has a history of CAD, mild carotid disease, Agent Orange exposure, COPD, diabetes complicated by neuropathy, hypertension, hyperlipidemia, reflux, peripheral neuropathy, PTSD, sleep apnea, and aortic root and ascending aneurysms.  He has been recently followed by Dr. Dorris Fetch with last visit being in 11/2022.  He presents today for 6 months follow up.  Patient states he is doing great.  He said doctors can't believe he is alive with everything he has going on.  He denies chest pain.  He does have some shortness of breath but states he has reduced lung function.  He is very active going to the gym daily lifting weights which he states makes him feel great.  Current Outpatient Medications on File Prior to Visit  Medication Sig Dispense Refill   albuterol (PROVENTIL HFA;VENTOLIN HFA) 108 (90 BASE) MCG/ACT inhaler Inhale 1 puff into the lungs every 6 (six) hours as needed for wheezing or shortness of breath.      albuterol (PROVENTIL) (2.5 MG/3ML) 0.083% nebulizer solution Take 3 mLs (2.5 mg total) by nebulization every 4 (four) hours as needed for wheezing or shortness of breath. 75 mL 0   albuterol (VENTOLIN HFA) 108 (90 Base) MCG/ACT inhaler Inhale 2 puffs into the lungs every 4 (four) hours as needed for wheezing or shortness of breath. 8 g 0   alum & mag hydroxide-simeth (MAALOX MAX) 400-400-40 MG/5ML suspension Take 10 mLs by mouth every 6 (six) hours as needed for indigestion. 355 mL 0   amoxicillin-clavulanate (AUGMENTIN) 875-125 MG tablet Take 1 tablet by mouth every 12 (twelve) hours. 14 tablet 0   aspirin EC 81 MG EC tablet Take 1 tablet (81 mg total) by mouth daily.     azithromycin (ZITHROMAX) 250 MG tablet Take 1  tablet (250 mg total) by mouth daily. 2 tabs po on day 1, 1 tab po on days 2-5 6 tablet 0   budesonide-formoterol (SYMBICORT) 80-4.5 MCG/ACT inhaler Inhale 2 puffs into the lungs 2 (two) times daily. 1 Inhaler 3   carvedilol (COREG) 12.5 MG tablet Take 12.5 mg by mouth 2 (two) times daily.      cetirizine (ZYRTEC) 10 MG tablet Take 1 tablet (10 mg total) by mouth daily. 30 tablet 11   Cholecalciferol (VITAMIN D3) 5000 units CAPS Take 5,000 Units by mouth daily.     clopidogrel (PLAVIX) 75 MG tablet Take 75 mg by mouth daily.     DULoxetine (CYMBALTA) 60 MG capsule Take 90 mg by mouth daily.     ezetimibe (ZETIA) 10 MG tablet Take 10 mg by mouth at bedtime.      fluticasone (FLONASE) 50 MCG/ACT nasal spray Place 2 sprays into both nostrils daily. 16 g 0   gemfibrozil (LOPID) 600 MG tablet Take 600 mg by mouth 2 (two) times daily before a meal.     HYDROcodone-acetaminophen (NORCO/VICODIN) 5-325 MG tablet Take 1 tablet by mouth every 6 (six) hours as needed for moderate pain (pain score 4-6) or severe pain (pain score 7-10). 10 tablet 0   lidocaine (XYLOCAINE) 2 % solution Use as directed 10 mLs in the mouth or throat every  6 (six) hours as needed (stomach pain). 100 mL 0   metFORMIN (GLUCOPHAGE) 1000 MG tablet Take 1,000 mg by mouth 2 (two) times daily with a meal.     nitroGLYCERIN (NITROSTAT) 0.4 MG SL tablet Place 0.4 mg under the tongue every 5 (five) minutes as needed for chest pain.     pantoprazole (PROTONIX) 40 MG tablet Take 1 tablet (40 mg total) by mouth 2 (two) times daily before a meal. Before breakfast and supper 180 tablet 0   polyethylene glycol powder (MIRALAX) 17 GM/SCOOP powder Please start taking 1 capful 3 times a day. Slowly cut back as needed until you have normal bowel movements. 255 g 0   predniSONE (DELTASONE) 50 MG tablet Take 1 tablet (50 mg total) by mouth daily with breakfast. 5 tablet 0   tamsulosin (FLOMAX) 0.4 MG CAPS capsule Take 1 capsule (0.4 mg total) by mouth  daily. 90 capsule 3   tetrahydrozoline 0.05 % ophthalmic solution Place 1-2 drops into both eyes 3 (three) times daily as needed (for dry/irritated eyes).     tiotropium (SPIRIVA) 18 MCG inhalation capsule Place 18 mcg into inhaler and inhale daily as needed (for breathing).     [DISCONTINUED] omeprazole (PRILOSEC) 20 MG capsule Take 20 mg by mouth daily.     No current facility-administered medications on file prior to visit.   Physical Exam  BP 124/78   Pulse 60   Resp 18   Ht 5\' 5"  (1.651 m)   Wt 161 lb (73 kg)   SpO2 94%   BMI 26.79 kg/m   Gen: NAD Heart: RRR Lungs: CTA bilaterally Neck: No carotid bruit Ext: no swelling Neuro: intact  CTA Results:  FINDINGS: Cardiovascular: Stable aneurysmal disease of the aortic root measuring approximately 4.5-4.8 cm at the level of the sinuses of Valsalva. Stable aneurysmal disease of the proximal ascending thoracic aorta measuring approximately 4.3-4.4 cm. The aortic arch measures 2.7-2.9 cm. The proximal descending thoracic aorta demonstrates stable mild dilatation measuring 3.4 cm. The rest of the descending thoracic aorta is of normal caliber measuring approximately 2.7-2.9 cm. No evidence of aortic dissection. Proximal great vessels demonstrate normal patency and normal branching anatomy.   The heart size is stable and within normal limits. Evidence of prior CABG. No pericardial fluid identified. Central pulmonary arteries are normal in caliber.   Mediastinum/Nodes: No enlarged mediastinal, hilar, or axillary lymph nodes. Thyroid gland, trachea, and esophagus demonstrate no significant findings.   Lungs/Pleura: Stable emphysematous lung disease with peripheral upper lung zone bullae bilaterally, right greater than left. There is no evidence of pulmonary edema, consolidation, pneumothorax, nodule or pleural fluid.   Upper Abdomen: Stable calcified gallstones.   Musculoskeletal: No chest wall abnormality. No acute or  significant osseous findings.   Review of the MIP images confirms the above findings.   IMPRESSION: 1. Stable aneurysmal disease of the aortic root measuring approximately 4.5-4.8 cm at the level of the sinuses of Valsalva. 2. Stable aneurysmal disease of the proximal ascending thoracic aorta measuring approximately 4.3-4.4 cm. Recommend annual imaging followup by CTA or MRA. This recommendation follows 2010 ACCF/AHA/AATS/ACR/ASA/SCA/SCAI/SIR/STS/SVM Guidelines for the Diagnosis and Management of Patients with Thoracic Aortic Disease. Circulation. 2010; 121: F621-H086. Aortic aneurysm NOS (ICD10-I71.9) 3. Stable mild dilatation of the proximal descending thoracic aorta measuring 3.4 cm. 4. Stable emphysematous lung disease with peripheral upper lung zone bullae bilaterally, right greater than left. 5. Stable calcified gallstones.   Aortic aneurysm NOS (ICD10-I71.9).     Electronically Signed  By: Irish Lack M.D.   On: 06/20/2023 11:19   ECHO:  Left Ventricle  Left ventricle size is normal. Wall thickness is normal. Systolic function is normal. EF: 60-65%. Some views suggest abnormal distal septal motion. Doppler parameters indicate normal diastolic function.   Right Ventricle  Right ventricle size is normal. Systolic function is normal. Normal tricuspid annular plane systolic excursion (TAPSE) >1.7 cm.   Left Atrium  Left atrium size is normal. Atrial septum appears intact.   Right Atrium  Right atrium size is normal.   IVC/SVC  The inferior vena cava demonstrates a diameter of <=2.1 cm and collapses >50%; therefore, the right atrial pressure is estimated at 3 mmHg.   Mitral Valve  Mitral valve structure is normal. There is no mitral regurgitation. There is no evidence of mitral valve stenosis.   Tricuspid Valve  Tricuspid valve structure is normal. There is no regurgitation or stenosis. The right ventricular systolic pressure is normal (<36 mmHg).   Aortic  Valve  The aortic valve is tricuspid. The leaflets are not thickened and exhibit normal excursion. Mild to moderate aortic valve regurgitation. There is no evidence of aortic valve stenosis.   Pulmonic Valve  The pulmonic valve was not well visualized. Trace regurgitation. There is no evidence of pulmonic valve stenosis.   Ascending Aorta  The aortic root is moderately dilated.   Pericardium  There is no pericardial effusion.   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  He has mild to moderate AI at this time.. There is no indication for surgical intervention at this time.  We will continue routine 6 month surveillance with repeat CTA chest  Risk Modification:  Patient educated the weight lifting should be avoided.  He was instructed that this can increase his risk of dissection.  He was also instructed to avoid HIIT training as well.    Statin:  Intolerant  Patient was counseled on importance of Blood Pressure Control.  Despite Medical intervention if the patient notices persistently elevated blood pressure readings.  They are instructed to contact their Primary Care Physician  Please avoid use of Fluoroquinolones as this can potentially increase your risk of Aortic Rupture and/or Dissection  Patient educated on signs and symptoms of Aortic Dissection, handout also provided in AVS  Makaya Juneau, PA-C 07/18/23

## 2023-07-18 NOTE — Patient Instructions (Signed)
Patient is counseled regarding the importance of long term risk factor modification as they pertain to the presence of ischemic heart disease including avoiding the use of all tobacco products, dietary modifications and medical therapy for diabetes, cholesterol and lipid management, and regular exercise.   ° ° °Make every effort to maintain a "heart-healthy" lifestyle with regular physical exercise and adherence to a low-fat, low-carbohydrate diet.  Continue to seek regular follow-up appointments with your primary care physician and/or cardiologist. ° °

## 2023-07-24 ENCOUNTER — Ambulatory Visit (INDEPENDENT_AMBULATORY_CARE_PROVIDER_SITE_OTHER): Payer: Medicare Other | Admitting: Physician Assistant

## 2023-07-24 VITALS — BP 124/78 | HR 60 | Resp 18 | Ht 65.0 in | Wt 161.0 lb

## 2023-07-24 DIAGNOSIS — I712 Thoracic aortic aneurysm, without rupture, unspecified: Secondary | ICD-10-CM | POA: Diagnosis not present

## 2023-11-30 ENCOUNTER — Other Ambulatory Visit: Payer: Self-pay | Admitting: Thoracic Surgery (Cardiothoracic Vascular Surgery)

## 2023-11-30 DIAGNOSIS — I712 Thoracic aortic aneurysm, without rupture, unspecified: Secondary | ICD-10-CM

## 2024-01-05 ENCOUNTER — Ambulatory Visit (HOSPITAL_COMMUNITY)
Admission: RE | Admit: 2024-01-05 | Discharge: 2024-01-05 | Disposition: A | Discharge status: Home / Self Care | Source: Ambulatory Visit | Attending: Thoracic Surgery (Cardiothoracic Vascular Surgery) | Admitting: Thoracic Surgery (Cardiothoracic Vascular Surgery)

## 2024-01-05 DIAGNOSIS — I7121 Aneurysm of the ascending aorta, without rupture: Secondary | ICD-10-CM | POA: Insufficient documentation

## 2024-01-05 DIAGNOSIS — J439 Emphysema, unspecified: Secondary | ICD-10-CM | POA: Diagnosis not present

## 2024-01-05 DIAGNOSIS — I712 Thoracic aortic aneurysm, without rupture, unspecified: Secondary | ICD-10-CM | POA: Diagnosis present

## 2024-01-05 DIAGNOSIS — I700 Atherosclerosis of aorta: Secondary | ICD-10-CM | POA: Diagnosis not present

## 2024-01-05 DIAGNOSIS — K8020 Calculus of gallbladder without cholecystitis without obstruction: Secondary | ICD-10-CM | POA: Diagnosis not present

## 2024-01-05 LAB — POCT I-STAT CREATININE: Creatinine, Ser: 0.9 mg/dL (ref 0.61–1.24)

## 2024-01-05 MED ORDER — IOHEXOL 350 MG/ML SOLN
75.0000 mL | Freq: Once | INTRAVENOUS | Status: AC | PRN
  Administered 2024-01-05: 75 mL via INTRAVENOUS

## 2024-01-11 NOTE — Patient Instructions (Signed)

## 2024-01-11 NOTE — Progress Notes (Addendum)
 [address] [place] [address]             [phone]            HARWOOD NALL [id number] November 18, 1945   History of Present Illness:  Mr. Lopez Dentinger is a 78 year old male with history of CAD, s/p CABG in 2016, mild carotid disease, Agent Orange exposure, COPD, diabetes complicated by neuropathy, hypertension, hyperlipidemia, reflux, peripheral neuropathy, PTSD, sleep apnea, and aortic root and ascending aneurysms.  He presents today for continued surveillance of of aneurysms.  He has been followed since 2017. Aortic root has measured 4.5 cm - 5.0 cm which is stable in size for the past year.  Ascending thoracic aortic aneurysm has measured 4.2 cm - 4.4 cm.  CTA of chest from 01/05/2024 measured aortic root at 4.9 cm and ascending thoracic aortic aneurysm at 4.3 cm. Echocardiogram from 2016 showed tricuspid aortic valve with mild aortic regurgitation.   His blood pressure is well-controlled with current medication therapy.  He states that he does exercise by doing yard work around his house.  He sometimes does lift heavier items over 50 pounds.  He has been having some chest pain that is random.  The chest pain starts in the left side and radiates down to his toes.  This pain resolves after a couple of minutes.  He has not needed to use his nitroglycerin  for the chest pain.  Chest pain is not correlated to a specific activity.      Current Outpatient Medications on File Prior to Visit  Medication Sig Dispense Refill   albuterol  (PROVENTIL  HFA;VENTOLIN  HFA) 108 (90 BASE) MCG/ACT inhaler Inhale 1 puff into the lungs every 6 (six) hours as needed for wheezing or shortness of breath.      albuterol  (PROVENTIL ) (2.5 MG/3ML) 0.083% nebulizer solution Take 3 mLs (2.5 mg total) by nebulization every 4 (four) hours as needed for wheezing or shortness of breath. 75 mL 0   albuterol  (VENTOLIN  HFA) 108 (90 Base) MCG/ACT inhaler Inhale 2 puffs into the  lungs every 4 (four) hours as needed for wheezing or shortness of breath. 8 g 0   alum & mag hydroxide-simeth (MAALOX MAX) 400-400-40 MG/5ML suspension Take 10 mLs by mouth every 6 (six) hours as needed for indigestion. 355 mL 0   amoxicillin -clavulanate (AUGMENTIN ) 875-125 MG tablet Take 1 tablet by mouth every 12 (twelve) hours. 14 tablet 0   aspirin  EC 81 MG EC tablet Take 1 tablet (81 mg total) by mouth daily.     azithromycin  (ZITHROMAX ) 250 MG tablet Take 1 tablet (250 mg total) by mouth daily. 2 tabs po on day 1, 1 tab po on days 2-5 6 tablet 0   budesonide -formoterol  (SYMBICORT ) 80-4.5 MCG/ACT inhaler Inhale 2 puffs into the lungs 2 (two) times daily. 1 Inhaler 3   carvedilol  (COREG ) 12.5 MG tablet Take 12.5 mg by mouth 2 (two) times daily.      cetirizine  (ZYRTEC ) 10 MG tablet Take 1 tablet (10 mg total) by mouth daily. 30 tablet 11   Cholecalciferol (VITAMIN D3) 5000 units CAPS Take 5,000 Units by mouth daily.     clopidogrel  (PLAVIX ) 75 MG tablet Take 75 mg by mouth daily.     DULoxetine  (CYMBALTA ) 60 MG capsule Take 90 mg by mouth daily.     ezetimibe  (ZETIA ) 10 MG tablet Take 10 mg by mouth at bedtime.  fluticasone  (FLONASE ) 50 MCG/ACT nasal spray Place 2 sprays into both nostrils daily. 16 g 0   gemfibrozil  (LOPID ) 600 MG tablet Take 600 mg by mouth 2 (two) times daily before a meal.     HYDROcodone -acetaminophen  (NORCO/VICODIN) 5-325 MG tablet Take 1 tablet by mouth every 6 (six) hours as needed for moderate pain (pain score 4-6) or severe pain (pain score 7-10). 10 tablet 0   lidocaine  (XYLOCAINE ) 2 % solution Use as directed 10 mLs in the mouth or throat every 6 (six) hours as needed (stomach pain). 100 mL 0   metFORMIN  (GLUCOPHAGE ) 1000 MG tablet Take 1,000 mg by mouth 2 (two) times daily with a meal.     nitroGLYCERIN  (NITROSTAT ) 0.4 MG SL tablet Place 0.4 mg under the tongue every 5 (five) minutes as needed for chest pain.     pantoprazole  (PROTONIX ) 40 MG tablet Take 1  tablet (40 mg total) by mouth 2 (two) times daily before a meal. Before breakfast and supper 180 tablet 0   polyethylene glycol powder (MIRALAX ) 17 GM/SCOOP powder Please start taking 1 capful 3 times a day. Slowly cut back as needed until you have normal bowel movements. 255 g 0   predniSONE  (DELTASONE ) 50 MG tablet Take 1 tablet (50 mg total) by mouth daily with breakfast. 5 tablet 0   tamsulosin  (FLOMAX ) 0.4 MG CAPS capsule Take 1 capsule (0.4 mg total) by mouth daily. 90 capsule 3   tetrahydrozoline 0.05 % ophthalmic solution Place 1-2 drops into both eyes 3 (three) times daily as needed (for dry/irritated eyes).     tiotropium (SPIRIVA ) 18 MCG inhalation capsule Place 18 mcg into inhaler and inhale daily as needed (for breathing).     [DISCONTINUED] omeprazole  (PRILOSEC) 20 MG capsule Take 20 mg by mouth daily.     No current facility-administered medications on file prior to visit.     ROS: Review of Systems  Constitutional:  Negative for malaise/fatigue.  Respiratory:  Negative for cough and shortness of breath.   Cardiovascular:  Negative for leg swelling.  All other systems reviewed and are negative.    BP 128/76 (BP Location: Right Arm, Patient Position: Sitting, Cuff Size: Normal)   Pulse 72   Resp 20   Ht 5' 5 (1.651 m)   Wt 161 lb (73 kg)   SpO2 96% Comment: RA  BMI 26.79 kg/m   Physical Exam Constitutional:      Appearance: Normal appearance.  HENT:     Head: Normocephalic and atraumatic.  Cardiovascular:     Rate and Rhythm: Normal rate and regular rhythm.     Heart sounds: Normal heart sounds, S1 normal and S2 normal.  Pulmonary:     Effort: Pulmonary effort is normal.     Breath sounds: Normal breath sounds.  Skin:    General: Skin is warm and dry.  Neurological:     General: No focal deficit present.     Mental Status: He is alert and oriented to person, place, and time.      Imaging: CLINICAL DATA:  Follow-up aortic aneurysm.   EXAM: CT  ANGIOGRAPHY CHEST WITH CONTRAST   TECHNIQUE: Multidetector CT imaging of the chest was performed using the standard protocol during bolus administration of intravenous contrast. Multiplanar CT image reconstructions and MIPs were obtained to evaluate the vascular anatomy.   RADIATION DOSE REDUCTION: This exam was performed according to the departmental dose-optimization program which includes automated exposure control, adjustment of the mA and/or kV according to patient  size and/or use of iterative reconstruction technique.   CONTRAST:  75mL OMNIPAQUE  IOHEXOL  350 MG/ML SOLN   COMPARISON:  06/20/2023   FINDINGS: Cardiovascular: Heart is normal size. Prior median sternotomy. CABG.   Known aneurysm of the ascending thoracic aorta measuring approximately 4.3 cm in AP diameter. Aortic root measures 4.9 cm. Sinotubular junction measures 4.2 cm. Aortic arch measures approximately 3 cm in diameter. Descending thoracic aorta measures 3.1 cm. Mild calcified plaque throughout the descending thoracic aorta.   Normal 3 vessel takeoff from the aortic arch. Remaining vascular structures are unremarkable.   Mediastinum/Nodes: No mediastinal or hilar adenopathy. Remaining mediastinal structures are unremarkable.   Lungs/Pleura: Lungs are adequately inflated with moderate paracentral emphysematous disease over the apices as well as minimal centrilobular emphysematous disease over the upper lungs. No acute airspace process or effusion. Airways are normal.   Upper Abdomen: Cholelithiasis.  No acute findings.   Musculoskeletal: No focal abnormality.   Review of the MIP images confirms the above findings.   IMPRESSION: 1. Stable 4.3 cm aneurysm of the ascending thoracic aorta. Recommend annual imaging followup by CTA or MRA. This recommendation follows 2010 ACCF/AHA/AATS/ACR/ASA/SCA/SCAI/SIR/STS/SVM Guidelines for the Diagnosis and Management of Patients with Thoracic Aortic  Disease. Circulation. 2010; 121: Z733-z630. Aortic aneurysm NOS (ICD10-I71.9). 2. Cholelithiasis. 3. Emphysema. 4. Aortic atherosclerosis.   Aortic Atherosclerosis (ICD10-I70.0) and Emphysema (ICD10-J43.9).     Electronically Signed   By: Toribio Agreste M.D.   On: 01/05/2024 15:24     A/P: Thoracic aortic aneurysm without rupture, unspecified part (HCC) -4.3 cm ascending thoracic aortic aneurysm and 4.9 cm aortic root measured on CTA of chest. We discussed the natural history and and risk factors for growth of ascending aortic aneurysms. Discussed recommendations to minimize the risk of further expansion or dissection including careful blood pressure control, avoidance of contact sports and heavy lifting, attention to lipid management.  We covered the importance of continued smoking cessation.  The patient does not yet meet surgical criteria of >5.5cm. The patient is aware of signs and symptoms of aortic dissection and when to present to the emergency department.   -Follow up in 6 months for continued surveillance of ascending thoracic aortic aneurysm    Risk Modification:  Statin:  not prescribed, unable to take due to muscle pain  Smoking cessation instruction/counseling given:  commended patient for quitting and reviewed strategies for preventing relapses  Patient was counseled on importance of Blood Pressure Control  They are instructed to contact their Primary Care Physician if they start to have blood pressure readings over 130s/90s. Do not ever stop blood pressure medications on your own, unless instructed by healthcare professional.  Please avoid use of Fluoroquinolones as this can potentially increase your risk of Aortic Rupture and/or Dissection  Patient educated on signs and symptoms of Aortic Dissection, handout also provided in AVS  Manuelita CHRISTELLA Rough, PA-C 01/12/24

## 2024-01-12 ENCOUNTER — Ambulatory Visit

## 2024-01-12 VITALS — BP 128/76 | HR 72 | Resp 20 | Ht 65.0 in | Wt 161.0 lb

## 2024-01-12 DIAGNOSIS — I712 Thoracic aortic aneurysm, without rupture, unspecified: Secondary | ICD-10-CM | POA: Diagnosis not present

## 2024-02-01 NOTE — ED Notes (Signed)
 Pt ambulated approx 50 ft. Pt states he doesn't feel as shaky or dizzy, but still experiencing chest pain.

## 2024-02-01 NOTE — ED Provider Notes (Signed)
 [facility]  ED Provider Note  Eduardo [name] 78 y.o. male DOB: 1945/06/28 MRN: [id number] History   Chief Complaint  Patient presents with  . Chest Pain    Pt was playing golf and started feeling left sided chest tightness. Pt states he feels like he's shaking inside and feels like he's gonna pass out. Reports [facility]   Patient is a 78 year old male with history of coronary artery disease status post CABG x 4, hypertension, diabetes, COPD, he has a known ascending aortic aneurysm that measures about 4.8 cm at the root, who presents to the emergency department today for evaluation of shaking inside.  Patient states has been having chest pain and shaking inside that been going on for 7 years.  He states this morning he could not get the shaking inside sensation to stop so he came to the ER.  He drove himself.  He did not take anything for his symptoms.  He reports left-sided chest pain, constant, rated 7 out of 10.  No shortness of breath, nausea or vomiting.  No new leg pain or swelling, no history of DVT or PE.  His cardiology notes have been reviewed.  No fevers chills or cough.  No new medication changes.  No substance abuse.       Past Medical History:  Diagnosis Date  . Arteriosclerotic heart disease   . COPD (chronic obstructive pulmonary disease) (*)   . Degenerative arthritis of spine   . Diabetes mellitus (*)   . Diverticulitis   . Hypertension   . Hypertensive vascular disease   . PTSD (post-traumatic stress disorder)     Past Surgical History:  Procedure Laterality Date  . Back surgery    . Cardiac surgery     quadruple bypass  . Colon surgery    . Colonoscopy    . Neck surgery    . Replacement total knee Right   . Shoulder surgery Bilateral   . Thumb surgery Bilateral     Social History   Substance and Sexual Activity  Alcohol Use Not Currently   Tobacco Use History[1] E-Cigarettes  . Vaping Use Never User   . Start  Date    . Cartridges/Day    . Quit Date     Social History   Substance and Sexual Activity  Drug Use Never         Allergies[2]  Home Medications   ALBUTEROL  (ACCUNEB ) 0.63 MG/3 ML NEBULIZER SOLUTION    Take 3 mLs (0.63 mg dose) by nebulization every 6 (six) hours as needed for Wheezing.   ALBUTEROL  SULFATE HFA (PROVENTIL ,VENTOLIN ,PROAIR ) 108 (90 BASE) MCG/ACT INHALER    Inhale two puffs into the lungs every 6 (six) hours as needed for Wheezing.   ASPIRIN  (ASPRI-LOW,ECOTRIN LOW DOSE) 81 MG EC TABLET    Take one tablet (81 mg dose) by mouth daily.   BUSPIRONE (BUSPAR) 7.5 MG TABLET    TAKE 1 TABLET(7.5 MG) BY MOUTH TWICE DAILY   CALCIUM CARBONATE-VITAMIN D (OYST-CAL,OSCAL-500) 500-5 MG-MCG PER TABLET    Take one tablet by mouth daily.   CETIRIZINE  (ZYRTEC ) 10 MG TABLET    Take one tablet (10 mg dose) by mouth daily.   DICLOFENAC  SODIUM (VOLTAREN ) 1 % GEL    Place two g to four g onto the skin 4 (four) times a day as needed.   DULOXETINE  HCL (CYMBALTA ) 60 MG CAPSULE    TAKE 1 CAPSULE BY MOUTH TWICE DAILY   EZETIMIBE  (ZETIA ) 10 MG  TABLET    Take one tablet (10 mg dose) by mouth daily.   FLUTICASONE  (FLOVENT  DISKUS) 50 MCG/ACT DISKUS INHALER    Inhale one puff into the lungs 2 (two) times daily.   GEMFIBROZIL  (LOPID ) 600 MG TABLET    TAKE 1 TABLET TWICE A DAY BEFORE MEALS   METFORMIN  (GLUCOPHAGE ) 1000 MG TABLET    TAKE 1 TABLET TWICE A DAY WITH MEALS   NITROGLYCERIN  (NITROSTAT ) 0.4 MG SL TABLET    Place one tablet (0.4 mg dose) under the tongue every 5 (five) minutes as needed for Chest pain.   OXYBUTYNIN (DITROPAN) 5 MG TABLET    TAKE 1 TABLET(5 MG) BY MOUTH TWICE DAILY   PANTOPRAZOLE  SODIUM (PROTONIX ) 40 MG TABLET    Take one tablet (40 mg dose) by mouth daily.   SILDENAFIL CITRATE (VIAGRA) 50 MG TABLET    Take one tablet (50 mg dose) by mouth daily as needed.   SODIUM CHLORIDE  (OCEAN) 0.65% NASAL SPRAY    one spray by Nasal route as needed.   TAMSULOSIN  (FLOMAX ) 0.4 MG CAPS    TAKE 2  CAPSULES DAILY   TETRAHYDROZOLINE 0.05% OPHTHALMIC SOLUTION    one drop 4 (four) times daily.    Primary Survey  Primary Survey  Review of Systems   Review of Systems  Cardiovascular:  Positive for chest pain.  All other systems reviewed and are negative.   Physical Exam   ED Triage Vitals  BP 02/01/24 1152 (!) 180/88  Heart Rate 02/01/24 1152 81  Resp 02/01/24 1152 24  SpO2 02/01/24 1152 99 %  Temp 02/01/24 1212 98.4 F (36.9 C)    Physical Exam  Nursing note and vitals reviewed. Constitutional: He appears well-developed and well-nourished. He does not appear distressed and no respiratory distress.  HENT:  Head: Normocephalic and atraumatic.  Eyes: EOM are intact. Conjunctivae and lids are normal. Pupils are equal, round, and reactive to light. Right eye: no drainage. Left eye: no drainage. No scleral icterus.  Neck: Normal range of motion. Neck supple. No JVD.  Cardiovascular: Normal rate, regular rhythm and normal heart sounds.  Right chest wall tenderness noted  Pulmonary/Chest: No respiratory distress. Respiratory effort normal and breath sounds normal. No decreased breath sounds. No rhonchi.  Abdominal: Soft. There is no abdominal tenderness. Abdomen not distended. Bowel sounds are normal.  Musculoskeletal:     Cervical back: Normal range of motion and neck supple.     Comments: No edema, calf tenderness or Homans' sign bilaterally   Neurological: He is alert and oriented to person, place, and time. Cranial nerves intact II through XII. He has normal strength. Sensory intact.  Skin: Skin is warm. Skin is dry.  Psychiatric: He appears anxious.     ED Course   Lab results:   CBC AND DIFFERENTIAL - Abnormal      Result Value   WBC 6.1     RBC 4.65     HGB 15.4     HCT 44.7     MCV 96.1 (*)    MCH 33.1 (*)    MCHC 34.5     Plt Ct 194     RDW SD 44.4     MPV 10.1     NRBC% 0.0     Absolute NRBC Count 0.00     NEUTROPHIL % 62.2     LYMPHOCYTE % 26.6      MONOCYTE % 7.0     Eosinophil % 3.0     BASOPHIL %  0.7     IG% 0.5     ABSOLUTE NEUTROPHIL COUNT 3.80     ABSOLUTE LYMPHOCYTE COUNT 1.62     Absolute Monocyte Count 0.43     Absolute Eosinophil Count 0.18     Absolute Basophil Count 0.04     Absolute Immature Granulocyte Count 0.03    COMPREHENSIVE METABOLIC PANEL - Abnormal   Na 137     Potassium 3.9     Cl 100     CO2 22     AGAP 15     Glucose 156 (*)    BUN 19     Creatinine 0.95     Ca 8.9     ALK PHOS 61     T Bili 0.6     Total Protein 7.0     Alb 4.2     GLOBULIN 2.8     ALBUMIN /GLOBULIN RATIO 1.5     BUN/CREAT RATIO 20.0     ALT <5     AST 15     eGFR 81     Comment: Normal GFR (glomerular filtration rate) > 60 mL/min/1.73 meters squared, < 60 may include impaired kidney function. Calculation based on the Chronic Kidney Disease Epidemiology Collaboration (CK-EPI)equation refit without adjustment for race.  POCT GLUCOSE - Abnormal   Glucose, POC 159 (*)    OPERATOR ID [id number]     INSTRUMENT ID [id number]    GEN5 CARDIAC TROPONIN T (TNT5) BASELINE - Normal   TnT-Gen5 (0hr) 11     Comment: An elevated Troponin indicates myocardial damage. Elevated troponin may also be due to pulmonary emboli, aortic dissection, heart failure, trauma, toxins and ischemia in the setting of critical illness.   GEN5 CARDIAC TROPONIN T(TNT5) 1 HOUR - Normal   TnT-Gen5 (1hr) 11     Comment: An elevated Troponin indicates myocardial damage. Elevated troponin may also be due to pulmonary emboli, aortic dissection, heart failure, trauma, toxins and ischemia in the setting of critical illness.   Delta 1 Hour 0      Imaging:   XR CHEST AP PORTABLE   Narrative:    EXAM: XR CHEST AP PORTABLE  INDICATION: Chest Pain  COMPARISON: May 17, 2023  FINDINGS: The cardiac silhouette, mediastinum, and pulmonary vasculature are within normal limits. Changes from sternotomy and CABG are noted.  Both lungs are clear. No pleural  effusion or pneumothorax are identified.  The osseous structures and visualized upper abdomen are unremarkable.      Impression:    IMPRESSION: There is no evidence of acute cardiac or pulmonary process.   Electronically Signed by: Vito Jama Crease, MD on 02/01/2024 12:45 PM     ECG: ECG Results          ECG 12 lead (In process)  Result time 02/01/24 11:57:56    In process             Narrative:   Diagnosis Class Borderline Abnormal Acquisition Device D3K Systolic BP 180 Diastolic BP 88 Ventricular Rate 60 Atrial Rate 75 P-R Interval 248 QRS Duration 82 Q-T Interval 360 QTC Calculation(Bazett) 360 Calculated P Axis 79 Calculated R Axis 57 Calculated T Axis 68  Diagnosis Sinus rhythm with marked sinus arrhythmia with 1st degree AV block Otherwise normal ECG When compared with ECG of 17-May-2023 14:32, Minimal criteria for Inferior infarct are no longer present  Pre-Sedation Procedures    Medical Decision Making 78 year old male who presents to the emergency department today for evaluation of chronic chest pain and feel like he is shaking inside that was more acute today.  His medical screening exam is generally unremarkable and he appears to be anxious and he has right chest wall tenderness.  Differential diagnosis: I highly suspect this is more anxiety induced but certainly given his history will rule out ACS etc.  I do not suspect pneumonia, pneumothorax, PE or aortic dissection but I am aware of his history of the aortic root aneurysm.  This is being followed.  We will check standard cardiac workup, will give him a low-dose IV Ativan and reevaluate.  Patient's workup was unremarkable.  Discussed with staff and patient said that he felt he was going to pass out which is why he was brought back immediately, however, there is never  an actual syncopal event.  He seems to be better after Ativan although he reports he is still symptomatic.  We will check orthostatic vital signs, ensure he can ambulate and tolerate p.o. and anticipate outpatient follow-up.  Ambulatory and tolerating p.o.  Stable for discharge.  Amount and/or Complexity of Data Reviewed Labs: ordered. Radiology: ordered. ECG/medicine tests: ordered.  Risk Prescription drug management.          Provider Communication  New Prescriptions   No medications on file    Modified Medications   No medications on file    Discontinued Medications   No medications on file    Clinical Impression Final diagnoses:  Chronic chest pain    ED Disposition     ED Disposition  Discharge   Condition  Stable   Comment  --                 Follow-up Information     Lowanda Hopper, MD. Schedule an appointment as soon as possible for a visit in 1 day.   Specialty: Internal Medicine Comments: For follow up Contact information: [address] [place] [address] [phone]         Augusto ONEIDA Crawford Mickey., MD. Schedule an appointment as soon as possible for a visit in 1 day.   Specialties: Cardiovascular Disease, Cardiology Comments: For follow up Contact information: [address] [place] [address] [place] [address] [phone]                  Electronically signed by:       [1] Social History Tobacco Use  Smoking Status Former  . Current packs/day: 0.00  . Average packs/day: 0.5 packs/day for 13.2 years (6.6 ttl pk-yrs)  . Types: Cigarettes  . Start date: 03/27/1974  . Quit date: 59  . Years since quitting: 36.6  . Passive exposure: Past  Smokeless Tobacco Never  [2] Allergies Allergen Reactions  . Benazepril  Agitation    hyperkalemia hyperkalemia   . Niacin Hives and Redness  . Quinolones Hives    Patient was warned about not using Cipro and similar antibiotics. Recent studies  have raised concern that fluoroquinolone antibiotics could be associated with an increased risk of aortic aneurysm Fluoroquinolones have non-antimicrobial properties that might jeopardise the integrity of the extracellular matrix of the vascular wall In a  propensity score matched cohort study in [place], there was a 66% increased rate of aortic aneurysm or dissection associated with oral fluoroquinolone use, compared wit Patient was warned about not using Cipro and similar antibiotics. Recent studies have raised concern that fluoroquinolone  antibiotics could be associated with an increased risk of aortic aneurysm Fluoroquinolones have non-antimicrobial properties that might jeopardise the integrity of the extracellular matrix of the vascular wall In a  propensity score matched cohort study in [place], there was a 66% increased rate of aortic aneurysm or dissection associated with oral fluoroquinolone use, compared wit   . Statins Other    Body aches  . Atorvastatin Myalgia  . Simvastatin Myalgia   John D Bream, MD 02/01/24 1500

## 2024-02-01 NOTE — ED Notes (Signed)
 Pt given cup of water for PO challenge

## 2024-02-06 NOTE — Progress Notes (Signed)
 Spoke with Patient after discharge to discuss the recommended follow-up appointments. Name of the patient and their date of birth was verified. yes  Primary care provider was verified. yes  Scheduling:    Patient declined to schedule a follow-up appointment at this time.   Additional Needs:  Patient was asked if there is anything else they need today.   Comments:

## 2024-04-16 NOTE — Progress Notes (Signed)
 Attempted to contact the patient to discuss upcoming appointments and recommended health maintenance screenings for the year.  A [facility] Specialist has reviewed the patient's chart to identify any gaps in care and outstanding preventive health needs. However, we were unable to reach the patient to review or schedule the necessary follow-up appointments and screenings.  Interpreter Services: Assistance used during this phone call. No  Left message: yes   MyChart message sent: no   Voicemail was left including callback details and our hours of operation.  Comments: [facility]  Health Maintenance Due  Topic Date Due  . Diabetes Eye Exam  Never done  . Hepatitis A Vaccine (1 of 2 - Risk 2-dose series) Never done  . RSV Adult and Pregnancy (1 - 1-dose 75+ series) Never done  . Diabetes Hemoglobin A1C  08/29/2023  . Vitamin B12  09/22/2023  . Diabetes Annual Microalbumin/Creatinine Ratio  11/28/2023  . Diabetes Foot Exam  11/28/2023  . Influenza Vaccine (1) 01/29/2024  . Diabetes Lipid Profile  02/28/2024  . Medicare Annual Wellness  02/28/2024

## 2024-04-17 ENCOUNTER — Telehealth: Payer: Self-pay | Admitting: *Deleted
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
## 2024-04-18 ENCOUNTER — Other Ambulatory Visit: Payer: Self-pay | Admitting: Thoracic Surgery (Cardiothoracic Vascular Surgery)

## 2024-04-18 DIAGNOSIS — I712 Thoracic aortic aneurysm, without rupture, unspecified: Secondary | ICD-10-CM

## 2024-04-30 ENCOUNTER — Ambulatory Visit (HOSPITAL_COMMUNITY)
Admission: RE | Admit: 2024-04-30 | Discharge: 2024-04-30 | Disposition: A | Discharge status: Home / Self Care | Source: Ambulatory Visit | Attending: Thoracic Surgery (Cardiothoracic Vascular Surgery) | Admitting: Thoracic Surgery (Cardiothoracic Vascular Surgery)

## 2024-04-30 DIAGNOSIS — I712 Thoracic aortic aneurysm, without rupture, unspecified: Secondary | ICD-10-CM | POA: Insufficient documentation

## 2024-04-30 DIAGNOSIS — E119 Type 2 diabetes mellitus without complications: Secondary | ICD-10-CM | POA: Insufficient documentation

## 2024-04-30 DIAGNOSIS — E1142 Type 2 diabetes mellitus with diabetic polyneuropathy: Secondary | ICD-10-CM | POA: Insufficient documentation

## 2024-04-30 LAB — POCT I-STAT CREATININE: Creatinine, Ser: 1.1 mg/dL (ref 0.61–1.24)

## 2024-04-30 MED ORDER — IOHEXOL 350 MG/ML SOLN
100.0000 mL | Freq: Once | INTRAVENOUS | Status: AC | PRN
  Administered 2024-04-30: 100 mL via INTRAVENOUS

## 2024-05-14 ENCOUNTER — Ambulatory Visit
Attending: Thoracic Surgery (Cardiothoracic Vascular Surgery) | Admitting: Thoracic Surgery (Cardiothoracic Vascular Surgery)

## 2024-05-14 ENCOUNTER — Encounter: Payer: Self-pay | Admitting: Thoracic Surgery (Cardiothoracic Vascular Surgery)

## 2024-05-14 ENCOUNTER — Other Ambulatory Visit: Payer: Self-pay | Admitting: *Deleted

## 2024-05-14 ENCOUNTER — Other Ambulatory Visit: Payer: Self-pay

## 2024-05-14 VITALS — BP 124/74 | HR 60 | Resp 20 | Ht 65.0 in | Wt 156.0 lb

## 2024-05-14 DIAGNOSIS — I712 Thoracic aortic aneurysm, without rupture, unspecified: Secondary | ICD-10-CM | POA: Diagnosis present
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
